# Patient Record
Sex: Female | Born: 1950 | Race: White | Hispanic: No | Marital: Married | Smoking: Never smoker
Health system: Southern US, Community
[De-identification: ages and names within clinical notes are randomized; demographics above are authoritative.]

## PROBLEM LIST (undated history)

## (undated) DIAGNOSIS — R7303 Prediabetes: Secondary | ICD-10-CM

## (undated) DIAGNOSIS — E119 Type 2 diabetes mellitus without complications: Secondary | ICD-10-CM

## (undated) DIAGNOSIS — D649 Anemia, unspecified: Secondary | ICD-10-CM

## (undated) HISTORY — DX: Anemia, unspecified: D64.9

## (undated) HISTORY — DX: Type 2 diabetes mellitus without complications: E11.9

---

## 2021-11-20 DIAGNOSIS — H4089 Other specified glaucoma: Secondary | ICD-10-CM | POA: Diagnosis not present

## 2021-11-20 DIAGNOSIS — E113593 Type 2 diabetes mellitus with proliferative diabetic retinopathy without macular edema, bilateral: Secondary | ICD-10-CM | POA: Diagnosis not present

## 2021-11-20 DIAGNOSIS — E113592 Type 2 diabetes mellitus with proliferative diabetic retinopathy without macular edema, left eye: Secondary | ICD-10-CM | POA: Diagnosis not present

## 2021-11-20 DIAGNOSIS — H1045 Other chronic allergic conjunctivitis: Secondary | ICD-10-CM | POA: Diagnosis not present

## 2021-11-20 DIAGNOSIS — E113511 Type 2 diabetes mellitus with proliferative diabetic retinopathy with macular edema, right eye: Secondary | ICD-10-CM | POA: Diagnosis not present

## 2021-11-20 DIAGNOSIS — H3582 Retinal ischemia: Secondary | ICD-10-CM | POA: Diagnosis not present

## 2021-11-20 DIAGNOSIS — H25813 Combined forms of age-related cataract, bilateral: Secondary | ICD-10-CM | POA: Diagnosis not present

## 2021-11-20 DIAGNOSIS — H401122 Primary open-angle glaucoma, left eye, moderate stage: Secondary | ICD-10-CM | POA: Diagnosis not present

## 2021-11-21 DIAGNOSIS — H401122 Primary open-angle glaucoma, left eye, moderate stage: Secondary | ICD-10-CM | POA: Diagnosis not present

## 2021-11-21 DIAGNOSIS — Q132 Other congenital malformations of iris: Secondary | ICD-10-CM | POA: Diagnosis not present

## 2021-11-21 DIAGNOSIS — H21543 Posterior synechiae (iris), bilateral: Secondary | ICD-10-CM | POA: Diagnosis not present

## 2021-11-21 DIAGNOSIS — H40053 Ocular hypertension, bilateral: Secondary | ICD-10-CM | POA: Diagnosis not present

## 2021-11-21 DIAGNOSIS — H25813 Combined forms of age-related cataract, bilateral: Secondary | ICD-10-CM | POA: Diagnosis not present

## 2021-11-28 DIAGNOSIS — E113511 Type 2 diabetes mellitus with proliferative diabetic retinopathy with macular edema, right eye: Secondary | ICD-10-CM | POA: Diagnosis not present

## 2021-12-26 DIAGNOSIS — E113412 Type 2 diabetes mellitus with severe nonproliferative diabetic retinopathy with macular edema, left eye: Secondary | ICD-10-CM | POA: Diagnosis not present

## 2022-01-01 DIAGNOSIS — E113511 Type 2 diabetes mellitus with proliferative diabetic retinopathy with macular edema, right eye: Secondary | ICD-10-CM | POA: Diagnosis not present

## 2022-01-12 DIAGNOSIS — H16402 Unspecified corneal neovascularization, left eye: Secondary | ICD-10-CM | POA: Diagnosis not present

## 2022-01-12 DIAGNOSIS — H401123 Primary open-angle glaucoma, left eye, severe stage: Secondary | ICD-10-CM | POA: Diagnosis not present

## 2022-01-12 DIAGNOSIS — H40052 Ocular hypertension, left eye: Secondary | ICD-10-CM | POA: Diagnosis not present

## 2022-01-23 DIAGNOSIS — H2513 Age-related nuclear cataract, bilateral: Secondary | ICD-10-CM | POA: Diagnosis not present

## 2022-01-23 DIAGNOSIS — S0502XA Injury of conjunctiva and corneal abrasion without foreign body, left eye, initial encounter: Secondary | ICD-10-CM | POA: Diagnosis not present

## 2022-01-23 DIAGNOSIS — H4089 Other specified glaucoma: Secondary | ICD-10-CM | POA: Diagnosis not present

## 2022-01-23 DIAGNOSIS — H3582 Retinal ischemia: Secondary | ICD-10-CM | POA: Diagnosis not present

## 2022-01-23 DIAGNOSIS — E113593 Type 2 diabetes mellitus with proliferative diabetic retinopathy without macular edema, bilateral: Secondary | ICD-10-CM | POA: Diagnosis not present

## 2022-01-24 DIAGNOSIS — E113593 Type 2 diabetes mellitus with proliferative diabetic retinopathy without macular edema, bilateral: Secondary | ICD-10-CM | POA: Diagnosis not present

## 2022-01-24 DIAGNOSIS — H4089 Other specified glaucoma: Secondary | ICD-10-CM | POA: Diagnosis not present

## 2022-01-24 DIAGNOSIS — S0502XA Injury of conjunctiva and corneal abrasion without foreign body, left eye, initial encounter: Secondary | ICD-10-CM | POA: Diagnosis not present

## 2022-01-24 DIAGNOSIS — H3582 Retinal ischemia: Secondary | ICD-10-CM | POA: Diagnosis not present

## 2022-01-24 DIAGNOSIS — H2513 Age-related nuclear cataract, bilateral: Secondary | ICD-10-CM | POA: Diagnosis not present

## 2022-01-31 DIAGNOSIS — H3582 Retinal ischemia: Secondary | ICD-10-CM | POA: Diagnosis not present

## 2022-01-31 DIAGNOSIS — E113593 Type 2 diabetes mellitus with proliferative diabetic retinopathy without macular edema, bilateral: Secondary | ICD-10-CM | POA: Diagnosis not present

## 2022-01-31 DIAGNOSIS — S0502XA Injury of conjunctiva and corneal abrasion without foreign body, left eye, initial encounter: Secondary | ICD-10-CM | POA: Diagnosis not present

## 2022-01-31 DIAGNOSIS — H2513 Age-related nuclear cataract, bilateral: Secondary | ICD-10-CM | POA: Diagnosis not present

## 2022-01-31 DIAGNOSIS — H4089 Other specified glaucoma: Secondary | ICD-10-CM | POA: Diagnosis not present

## 2022-02-09 DIAGNOSIS — H25813 Combined forms of age-related cataract, bilateral: Secondary | ICD-10-CM | POA: Diagnosis not present

## 2022-02-09 DIAGNOSIS — H3582 Retinal ischemia: Secondary | ICD-10-CM | POA: Diagnosis not present

## 2022-02-09 DIAGNOSIS — H16402 Unspecified corneal neovascularization, left eye: Secondary | ICD-10-CM | POA: Diagnosis not present

## 2022-02-09 DIAGNOSIS — H43822 Vitreomacular adhesion, left eye: Secondary | ICD-10-CM | POA: Diagnosis not present

## 2022-02-09 DIAGNOSIS — H40052 Ocular hypertension, left eye: Secondary | ICD-10-CM | POA: Diagnosis not present

## 2022-02-09 DIAGNOSIS — E113511 Type 2 diabetes mellitus with proliferative diabetic retinopathy with macular edema, right eye: Secondary | ICD-10-CM | POA: Diagnosis not present

## 2022-02-09 DIAGNOSIS — H35041 Retinal micro-aneurysms, unspecified, right eye: Secondary | ICD-10-CM | POA: Diagnosis not present

## 2022-02-13 DIAGNOSIS — H3582 Retinal ischemia: Secondary | ICD-10-CM | POA: Diagnosis not present

## 2022-02-13 DIAGNOSIS — H2513 Age-related nuclear cataract, bilateral: Secondary | ICD-10-CM | POA: Diagnosis not present

## 2022-02-13 DIAGNOSIS — E113593 Type 2 diabetes mellitus with proliferative diabetic retinopathy without macular edema, bilateral: Secondary | ICD-10-CM | POA: Diagnosis not present

## 2022-02-13 DIAGNOSIS — H4089 Other specified glaucoma: Secondary | ICD-10-CM | POA: Diagnosis not present

## 2022-02-22 DIAGNOSIS — H40052 Ocular hypertension, left eye: Secondary | ICD-10-CM | POA: Diagnosis not present

## 2022-02-22 DIAGNOSIS — H25813 Combined forms of age-related cataract, bilateral: Secondary | ICD-10-CM | POA: Diagnosis not present

## 2022-02-22 DIAGNOSIS — H401123 Primary open-angle glaucoma, left eye, severe stage: Secondary | ICD-10-CM | POA: Diagnosis not present

## 2022-02-22 DIAGNOSIS — E113511 Type 2 diabetes mellitus with proliferative diabetic retinopathy with macular edema, right eye: Secondary | ICD-10-CM | POA: Diagnosis not present

## 2022-02-22 DIAGNOSIS — H35033 Hypertensive retinopathy, bilateral: Secondary | ICD-10-CM | POA: Diagnosis not present

## 2022-02-22 DIAGNOSIS — H3582 Retinal ischemia: Secondary | ICD-10-CM | POA: Diagnosis not present

## 2022-03-06 DIAGNOSIS — E113511 Type 2 diabetes mellitus with proliferative diabetic retinopathy with macular edema, right eye: Secondary | ICD-10-CM | POA: Diagnosis not present

## 2022-03-16 DIAGNOSIS — E113511 Type 2 diabetes mellitus with proliferative diabetic retinopathy with macular edema, right eye: Secondary | ICD-10-CM | POA: Diagnosis not present

## 2022-03-20 DIAGNOSIS — H3582 Retinal ischemia: Secondary | ICD-10-CM | POA: Diagnosis not present

## 2022-03-20 DIAGNOSIS — H4089 Other specified glaucoma: Secondary | ICD-10-CM | POA: Diagnosis not present

## 2022-03-20 DIAGNOSIS — E113593 Type 2 diabetes mellitus with proliferative diabetic retinopathy without macular edema, bilateral: Secondary | ICD-10-CM | POA: Diagnosis not present

## 2022-03-20 DIAGNOSIS — H2513 Age-related nuclear cataract, bilateral: Secondary | ICD-10-CM | POA: Diagnosis not present

## 2022-04-02 ENCOUNTER — Other Ambulatory Visit: Payer: Self-pay

## 2022-04-02 ENCOUNTER — Inpatient Hospital Stay (HOSPITAL_COMMUNITY)
Admission: EM | Admit: 2022-04-02 | Discharge: 2022-04-16 | DRG: 853 | Disposition: A | Discharge status: Skilled Nursing Facility | Payer: Medicare Other | Attending: Internal Medicine | Admitting: Internal Medicine

## 2022-04-02 ENCOUNTER — Encounter (HOSPITAL_COMMUNITY)
Admission: EM | Disposition: A | Discharge status: Skilled Nursing Facility | Payer: Self-pay | Source: Home / Self Care | Attending: Internal Medicine

## 2022-04-02 ENCOUNTER — Emergency Department (HOSPITAL_COMMUNITY): Payer: Medicare Other

## 2022-04-02 ENCOUNTER — Inpatient Hospital Stay (HOSPITAL_COMMUNITY): Payer: Medicare Other | Admitting: Certified Registered"

## 2022-04-02 ENCOUNTER — Encounter (HOSPITAL_COMMUNITY): Payer: Self-pay

## 2022-04-02 DIAGNOSIS — R41841 Cognitive communication deficit: Secondary | ICD-10-CM | POA: Diagnosis not present

## 2022-04-02 DIAGNOSIS — N319 Neuromuscular dysfunction of bladder, unspecified: Secondary | ICD-10-CM | POA: Diagnosis present

## 2022-04-02 DIAGNOSIS — I5020 Unspecified systolic (congestive) heart failure: Secondary | ICD-10-CM | POA: Diagnosis present

## 2022-04-02 DIAGNOSIS — B950 Streptococcus, group A, as the cause of diseases classified elsewhere: Secondary | ICD-10-CM | POA: Diagnosis not present

## 2022-04-02 DIAGNOSIS — E119 Type 2 diabetes mellitus without complications: Secondary | ICD-10-CM | POA: Diagnosis not present

## 2022-04-02 DIAGNOSIS — G9340 Encephalopathy, unspecified: Secondary | ICD-10-CM | POA: Diagnosis not present

## 2022-04-02 DIAGNOSIS — D696 Thrombocytopenia, unspecified: Secondary | ICD-10-CM | POA: Diagnosis not present

## 2022-04-02 DIAGNOSIS — E1165 Type 2 diabetes mellitus with hyperglycemia: Secondary | ICD-10-CM

## 2022-04-02 DIAGNOSIS — L02416 Cutaneous abscess of left lower limb: Secondary | ICD-10-CM | POA: Diagnosis present

## 2022-04-02 DIAGNOSIS — Z6828 Body mass index (BMI) 28.0-28.9, adult: Secondary | ICD-10-CM

## 2022-04-02 DIAGNOSIS — E531 Pyridoxine deficiency: Secondary | ICD-10-CM | POA: Diagnosis present

## 2022-04-02 DIAGNOSIS — R509 Fever, unspecified: Secondary | ICD-10-CM | POA: Diagnosis not present

## 2022-04-02 DIAGNOSIS — S80822A Blister (nonthermal), left lower leg, initial encounter: Secondary | ICD-10-CM

## 2022-04-02 DIAGNOSIS — M40204 Unspecified kyphosis, thoracic region: Secondary | ICD-10-CM | POA: Diagnosis not present

## 2022-04-02 DIAGNOSIS — E876 Hypokalemia: Secondary | ICD-10-CM | POA: Diagnosis not present

## 2022-04-02 DIAGNOSIS — M726 Necrotizing fasciitis: Secondary | ICD-10-CM

## 2022-04-02 DIAGNOSIS — E60 Dietary zinc deficiency: Secondary | ICD-10-CM | POA: Diagnosis present

## 2022-04-02 DIAGNOSIS — I110 Hypertensive heart disease with heart failure: Secondary | ICD-10-CM | POA: Diagnosis not present

## 2022-04-02 DIAGNOSIS — K5900 Constipation, unspecified: Secondary | ICD-10-CM | POA: Diagnosis not present

## 2022-04-02 DIAGNOSIS — R6520 Severe sepsis without septic shock: Secondary | ICD-10-CM | POA: Diagnosis present

## 2022-04-02 DIAGNOSIS — Z794 Long term (current) use of insulin: Secondary | ICD-10-CM | POA: Diagnosis not present

## 2022-04-02 DIAGNOSIS — E509 Vitamin A deficiency, unspecified: Secondary | ICD-10-CM | POA: Diagnosis present

## 2022-04-02 DIAGNOSIS — K5909 Other constipation: Secondary | ICD-10-CM | POA: Diagnosis not present

## 2022-04-02 DIAGNOSIS — R1310 Dysphagia, unspecified: Secondary | ICD-10-CM | POA: Diagnosis not present

## 2022-04-02 DIAGNOSIS — D649 Anemia, unspecified: Secondary | ICD-10-CM | POA: Diagnosis not present

## 2022-04-02 DIAGNOSIS — E43 Unspecified severe protein-calorie malnutrition: Secondary | ICD-10-CM | POA: Diagnosis present

## 2022-04-02 DIAGNOSIS — G9608 Other cranial cerebrospinal fluid leak: Secondary | ICD-10-CM | POA: Diagnosis present

## 2022-04-02 DIAGNOSIS — R609 Edema, unspecified: Secondary | ICD-10-CM | POA: Diagnosis not present

## 2022-04-02 DIAGNOSIS — L03116 Cellulitis of left lower limb: Secondary | ICD-10-CM | POA: Diagnosis present

## 2022-04-02 DIAGNOSIS — M7989 Other specified soft tissue disorders: Secondary | ICD-10-CM | POA: Diagnosis not present

## 2022-04-02 DIAGNOSIS — B370 Candidal stomatitis: Secondary | ICD-10-CM | POA: Diagnosis not present

## 2022-04-02 DIAGNOSIS — M6259 Muscle wasting and atrophy, not elsewhere classified, multiple sites: Secondary | ICD-10-CM | POA: Diagnosis not present

## 2022-04-02 DIAGNOSIS — Z89612 Acquired absence of left leg above knee: Secondary | ICD-10-CM

## 2022-04-02 DIAGNOSIS — R531 Weakness: Secondary | ICD-10-CM | POA: Diagnosis not present

## 2022-04-02 DIAGNOSIS — E871 Hypo-osmolality and hyponatremia: Secondary | ICD-10-CM

## 2022-04-02 DIAGNOSIS — D509 Iron deficiency anemia, unspecified: Secondary | ICD-10-CM | POA: Diagnosis present

## 2022-04-02 DIAGNOSIS — N179 Acute kidney failure, unspecified: Secondary | ICD-10-CM | POA: Diagnosis present

## 2022-04-02 DIAGNOSIS — I6782 Cerebral ischemia: Secondary | ICD-10-CM | POA: Diagnosis not present

## 2022-04-02 DIAGNOSIS — B955 Unspecified streptococcus as the cause of diseases classified elsewhere: Secondary | ICD-10-CM

## 2022-04-02 DIAGNOSIS — R7881 Bacteremia: Secondary | ICD-10-CM | POA: Diagnosis not present

## 2022-04-02 DIAGNOSIS — R1311 Dysphagia, oral phase: Secondary | ICD-10-CM | POA: Diagnosis not present

## 2022-04-02 DIAGNOSIS — I96 Gangrene, not elsewhere classified: Secondary | ICD-10-CM | POA: Diagnosis not present

## 2022-04-02 DIAGNOSIS — R202 Paresthesia of skin: Secondary | ICD-10-CM | POA: Diagnosis not present

## 2022-04-02 DIAGNOSIS — R2689 Other abnormalities of gait and mobility: Secondary | ICD-10-CM | POA: Diagnosis not present

## 2022-04-02 DIAGNOSIS — E861 Hypovolemia: Secondary | ICD-10-CM | POA: Diagnosis present

## 2022-04-02 DIAGNOSIS — R262 Difficulty in walking, not elsewhere classified: Secondary | ICD-10-CM | POA: Diagnosis not present

## 2022-04-02 DIAGNOSIS — R1312 Dysphagia, oropharyngeal phase: Secondary | ICD-10-CM | POA: Diagnosis not present

## 2022-04-02 DIAGNOSIS — Z7401 Bed confinement status: Secondary | ICD-10-CM | POA: Diagnosis not present

## 2022-04-02 DIAGNOSIS — A400 Sepsis due to streptococcus, group A: Secondary | ICD-10-CM | POA: Diagnosis not present

## 2022-04-02 DIAGNOSIS — I959 Hypotension, unspecified: Secondary | ICD-10-CM

## 2022-04-02 DIAGNOSIS — R339 Retention of urine, unspecified: Secondary | ICD-10-CM | POA: Diagnosis not present

## 2022-04-02 DIAGNOSIS — I517 Cardiomegaly: Secondary | ICD-10-CM | POA: Diagnosis not present

## 2022-04-02 DIAGNOSIS — R9431 Abnormal electrocardiogram [ECG] [EKG]: Secondary | ICD-10-CM | POA: Diagnosis not present

## 2022-04-02 DIAGNOSIS — Z9181 History of falling: Secondary | ICD-10-CM | POA: Diagnosis not present

## 2022-04-02 DIAGNOSIS — I509 Heart failure, unspecified: Secondary | ICD-10-CM | POA: Diagnosis not present

## 2022-04-02 DIAGNOSIS — R0689 Other abnormalities of breathing: Secondary | ICD-10-CM | POA: Diagnosis not present

## 2022-04-02 DIAGNOSIS — T8789 Other complications of amputation stump: Secondary | ICD-10-CM | POA: Diagnosis not present

## 2022-04-02 DIAGNOSIS — B379 Candidiasis, unspecified: Secondary | ICD-10-CM | POA: Diagnosis not present

## 2022-04-02 DIAGNOSIS — I5023 Acute on chronic systolic (congestive) heart failure: Secondary | ICD-10-CM | POA: Diagnosis not present

## 2022-04-02 DIAGNOSIS — R200 Anesthesia of skin: Secondary | ICD-10-CM | POA: Diagnosis not present

## 2022-04-02 DIAGNOSIS — M6281 Muscle weakness (generalized): Secondary | ICD-10-CM | POA: Diagnosis not present

## 2022-04-02 DIAGNOSIS — G9341 Metabolic encephalopathy: Secondary | ICD-10-CM | POA: Diagnosis present

## 2022-04-02 DIAGNOSIS — E1169 Type 2 diabetes mellitus with other specified complication: Secondary | ICD-10-CM | POA: Diagnosis not present

## 2022-04-02 DIAGNOSIS — R600 Localized edema: Secondary | ICD-10-CM | POA: Diagnosis not present

## 2022-04-02 HISTORY — PX: I & D EXTREMITY: SHX5045

## 2022-04-02 HISTORY — DX: Prediabetes: R73.03

## 2022-04-02 LAB — LIPID PANEL
Cholesterol: 87 mg/dL (ref 0–200)
HDL: 16 mg/dL — ABNORMAL LOW (ref >40)
LDL Cholesterol: 41 mg/dL (ref 0–99)
Total CHOL/HDL Ratio: 5.4 RATIO
Triglycerides: 150 mg/dL — ABNORMAL HIGH (ref <150)
VLDL: 30 mg/dL (ref 0–40)

## 2022-04-02 LAB — CBC WITH DIFFERENTIAL/PLATELET
Abs Immature Granulocytes: 0 10*3/uL (ref 0.00–0.07)
Basophils Absolute: 0 10*3/uL (ref 0.0–0.1)
Basophils Relative: 0 %
Eosinophils Absolute: 0 10*3/uL (ref 0.0–0.5)
Eosinophils Relative: 0 %
HCT: 26.8 % — ABNORMAL LOW (ref 36.0–46.0)
Hemoglobin: 9.3 g/dL — ABNORMAL LOW (ref 12.0–15.0)
Lymphocytes Relative: 5 %
Lymphs Abs: 0.3 10*3/uL — ABNORMAL LOW (ref 0.7–4.0)
MCH: 26.3 pg (ref 26.0–34.0)
MCHC: 34.7 g/dL (ref 30.0–36.0)
MCV: 75.9 fL — ABNORMAL LOW (ref 80.0–100.0)
Monocytes Absolute: 0 10*3/uL — ABNORMAL LOW (ref 0.1–1.0)
Monocytes Relative: 0 %
Neutro Abs: 4.8 10*3/uL (ref 1.7–7.7)
Neutrophils Relative %: 95 %
Platelets: 240 10*3/uL (ref 150–400)
RBC: 3.53 MIL/uL — ABNORMAL LOW (ref 3.87–5.11)
RDW: 12.8 % (ref 11.5–15.5)
WBC: 5.1 10*3/uL (ref 4.0–10.5)
nRBC: 0 % (ref 0.0–0.2)
nRBC: 1 /100 WBC — ABNORMAL HIGH

## 2022-04-02 LAB — I-STAT CHEM 8, ED
BUN: 29 mg/dL — ABNORMAL HIGH (ref 8–23)
BUN: 25 mg/dL — ABNORMAL HIGH (ref 8–23)
Calcium, Ion: 0.99 mmol/L — ABNORMAL LOW (ref 1.15–1.40)
Calcium, Ion: 1 mmol/L — ABNORMAL LOW (ref 1.15–1.40)
Chloride: 90 mmol/L — ABNORMAL LOW (ref 98–111)
Chloride: 89 mmol/L — ABNORMAL LOW (ref 98–111)
Creatinine, Ser: 1 mg/dL (ref 0.44–1.00)
Creatinine, Ser: 0.8 mg/dL (ref 0.44–1.00)
Glucose, Bld: 206 mg/dL — ABNORMAL HIGH (ref 70–99)
Glucose, Bld: 205 mg/dL — ABNORMAL HIGH (ref 70–99)
HCT: 26 % — ABNORMAL LOW (ref 36.0–46.0)
HCT: 30 % — ABNORMAL LOW (ref 36.0–46.0)
Hemoglobin: 8.8 g/dL — ABNORMAL LOW (ref 12.0–15.0)
Hemoglobin: 10.2 g/dL — ABNORMAL LOW (ref 12.0–15.0)
Potassium: 3.4 mmol/L — ABNORMAL LOW (ref 3.5–5.1)
Potassium: 3.3 mmol/L — ABNORMAL LOW (ref 3.5–5.1)
Sodium: 120 mmol/L — ABNORMAL LOW (ref 135–145)
Sodium: 120 mmol/L — ABNORMAL LOW (ref 135–145)
TCO2: 20 mmol/L — ABNORMAL LOW (ref 22–32)
TCO2: 18 mmol/L — ABNORMAL LOW (ref 22–32)

## 2022-04-02 LAB — BLOOD CULTURE ID PANEL (REFLEXED) - BCID2

## 2022-04-02 LAB — COMPREHENSIVE METABOLIC PANEL
ALT: 26 U/L (ref 0–44)
AST: 44 U/L — ABNORMAL HIGH (ref 15–41)
Albumin: 2.1 g/dL — ABNORMAL LOW (ref 3.5–5.0)
Alkaline Phosphatase: 48 U/L (ref 38–126)
Anion gap: 15 (ref 5–15)
BUN: 33 mg/dL — ABNORMAL HIGH (ref 8–23)
CO2: 17 mmol/L — ABNORMAL LOW (ref 22–32)
Calcium: 7.9 mg/dL — ABNORMAL LOW (ref 8.9–10.3)
Chloride: 88 mmol/L — ABNORMAL LOW (ref 98–111)
Creatinine, Ser: 1.06 mg/dL — ABNORMAL HIGH (ref 0.44–1.00)
GFR, Estimated: 57 mL/min — ABNORMAL LOW (ref >60)
Glucose, Bld: 202 mg/dL — ABNORMAL HIGH (ref 70–99)
Potassium: 3.4 mmol/L — ABNORMAL LOW (ref 3.5–5.1)
Sodium: 120 mmol/L — ABNORMAL LOW (ref 135–145)
Total Bilirubin: 0.7 mg/dL (ref 0.3–1.2)
Total Protein: 5.5 g/dL — ABNORMAL LOW (ref 6.5–8.1)

## 2022-04-02 LAB — IRON AND TIBC
Iron: 6 ug/dL — ABNORMAL LOW (ref 28–170)
Saturation Ratios: 3 % — ABNORMAL LOW (ref 10.4–31.8)
TIBC: 211 ug/dL — ABNORMAL LOW (ref 250–450)
UIBC: 205 ug/dL

## 2022-04-02 LAB — LACTIC ACID, PLASMA
Lactic Acid, Venous: 2.2 mmol/L (ref 0.5–1.9)
Lactic Acid, Venous: 2.4 mmol/L (ref 0.5–1.9)
Lactic Acid, Venous: 1.9 mmol/L (ref 0.5–1.9)

## 2022-04-02 LAB — GLUCOSE, CAPILLARY
Glucose-Capillary: 207 mg/dL — ABNORMAL HIGH (ref 70–99)
Glucose-Capillary: 182 mg/dL — ABNORMAL HIGH (ref 70–99)
Glucose-Capillary: 143 mg/dL — ABNORMAL HIGH (ref 70–99)

## 2022-04-02 LAB — BASIC METABOLIC PANEL
Anion gap: 10 (ref 5–15)
Anion gap: 14 (ref 5–15)
BUN: 25 mg/dL — ABNORMAL HIGH (ref 8–23)
BUN: 24 mg/dL — ABNORMAL HIGH (ref 8–23)
CO2: 19 mmol/L — ABNORMAL LOW (ref 22–32)
CO2: 19 mmol/L — ABNORMAL LOW (ref 22–32)
Calcium: 7.6 mg/dL — ABNORMAL LOW (ref 8.9–10.3)
Calcium: 7.9 mg/dL — ABNORMAL LOW (ref 8.9–10.3)
Chloride: 95 mmol/L — ABNORMAL LOW (ref 98–111)
Chloride: 93 mmol/L — ABNORMAL LOW (ref 98–111)
Creatinine, Ser: 0.85 mg/dL (ref 0.44–1.00)
Creatinine, Ser: 0.91 mg/dL (ref 0.44–1.00)
GFR, Estimated: >60 mL/min (ref >60)
GFR, Estimated: >60 mL/min (ref >60)
Glucose, Bld: 194 mg/dL — ABNORMAL HIGH (ref 70–99)
Glucose, Bld: 138 mg/dL — ABNORMAL HIGH (ref 70–99)
Potassium: 3.1 mmol/L — ABNORMAL LOW (ref 3.5–5.1)
Potassium: 3 mmol/L — ABNORMAL LOW (ref 3.5–5.1)
Sodium: 124 mmol/L — ABNORMAL LOW (ref 135–145)
Sodium: 126 mmol/L — ABNORMAL LOW (ref 135–145)

## 2022-04-02 LAB — HEMOGLOBIN A1C
Hgb A1c MFr Bld: 11 % — ABNORMAL HIGH (ref 4.8–5.6)
Mean Plasma Glucose: 269 mg/dL

## 2022-04-02 LAB — HIV ANTIBODY (ROUTINE TESTING W REFLEX): HIV Screen 4th Generation wRfx: NONREACTIVE

## 2022-04-02 LAB — AMMONIA: Ammonia: 16 umol/L (ref 9–35)

## 2022-04-02 LAB — PROTIME-INR
INR: 1.3 — ABNORMAL HIGH (ref 0.8–1.2)
Prothrombin Time: 15.8 seconds — ABNORMAL HIGH (ref 11.4–15.2)

## 2022-04-02 LAB — SURGICAL PCR SCREEN
MRSA, PCR: NEGATIVE
Staphylococcus aureus: POSITIVE — AB

## 2022-04-02 LAB — ABO/RH: ABO/RH(D): O POS

## 2022-04-02 LAB — FERRITIN: Ferritin: 90 ng/mL (ref 11–307)

## 2022-04-02 LAB — CK: Total CK: 369 U/L — ABNORMAL HIGH (ref 38–234)

## 2022-04-02 LAB — OSMOLALITY: Osmolality: 265 mOsm/kg — ABNORMAL LOW (ref 275–295)

## 2022-04-02 SURGERY — IRRIGATION AND DEBRIDEMENT EXTREMITY
Anesthesia: General | Laterality: Left

## 2022-04-02 MED ORDER — ACETAMINOPHEN 325 MG PO TABS: 650.0000 mg | ORAL_TABLET | Freq: Four times a day (QID) | ORAL | Status: DC | PRN

## 2022-04-02 MED ORDER — PROPOFOL 10 MG/ML IV BOLUS
INTRAVENOUS | Status: DC | PRN
  Administered 2022-04-02: 110 mg via INTRAVENOUS

## 2022-04-02 MED ORDER — CHLORHEXIDINE GLUCONATE 4 % EX LIQD: 60.0000 mL | Freq: Once | CUTANEOUS | Status: DC

## 2022-04-02 MED ORDER — PHENYLEPHRINE 80 MCG/ML (10ML) SYRINGE FOR IV PUSH (FOR BLOOD PRESSURE SUPPORT)
PREFILLED_SYRINGE | INTRAVENOUS | Status: DC | PRN
  Administered 2022-04-02: 240 ug via INTRAVENOUS
  Administered 2022-04-02: 80 ug via INTRAVENOUS
  Administered 2022-04-02: 320 ug via INTRAVENOUS
  Administered 2022-04-02 (×2): 240 ug via INTRAVENOUS
  Administered 2022-04-02: 160 ug via INTRAVENOUS

## 2022-04-02 MED ORDER — EPHEDRINE SULFATE-NACL 50-0.9 MG/10ML-% IV SOSY
PREFILLED_SYRINGE | INTRAVENOUS | Status: DC | PRN
  Administered 2022-04-02: 10 mg via INTRAVENOUS
  Administered 2022-04-02: 5 mg via INTRAVENOUS
  Administered 2022-04-02: 10 mg via INTRAVENOUS

## 2022-04-02 MED ORDER — PROPOFOL 10 MG/ML IV BOLUS
INTRAVENOUS | Status: AC
  Filled 2022-04-02: qty 20

## 2022-04-02 MED ORDER — PENICILLIN G POTASSIUM 20000000 UNITS IJ SOLR
4.0000 10*6.[IU] | INTRAVENOUS | Status: DC
  Administered 2022-04-03 (×2): 4 10*6.[IU] via INTRAVENOUS
  Filled 2022-04-02 (×6): qty 4

## 2022-04-02 MED ORDER — OXYCODONE HCL 5 MG PO TABS: 5.0000 mg | ORAL_TABLET | Freq: Once | ORAL | Status: DC | PRN

## 2022-04-02 MED ORDER — LACTATED RINGERS IV SOLN: INTRAVENOUS | Status: DC

## 2022-04-02 MED ORDER — SODIUM CHLORIDE 0.9 % IR SOLN
Status: DC | PRN
  Administered 2022-04-02: 1000 mL

## 2022-04-02 MED ORDER — ACETAMINOPHEN 650 MG RE SUPP: 650.0000 mg | Freq: Four times a day (QID) | RECTAL | Status: DC | PRN

## 2022-04-02 MED ORDER — DEXAMETHASONE SODIUM PHOSPHATE 10 MG/ML IJ SOLN
INTRAMUSCULAR | Status: DC | PRN
  Administered 2022-04-02: 5 mg via INTRAVENOUS

## 2022-04-02 MED ORDER — CHLORHEXIDINE GLUCONATE 0.12 % MT SOLN
15.0000 mL | Freq: Once | OROMUCOSAL | Status: AC
  Administered 2022-04-02: 15 mL via OROMUCOSAL
  Filled 2022-04-02 (×2): qty 15

## 2022-04-02 MED ORDER — LINEZOLID 600 MG/300ML IV SOLN
600.0000 mg | Freq: Two times a day (BID) | INTRAVENOUS | Status: DC
  Administered 2022-04-02 – 2022-04-09 (×14): 600 mg via INTRAVENOUS
  Filled 2022-04-02 (×16): qty 300

## 2022-04-02 MED ORDER — MIDAZOLAM HCL 2 MG/2ML IJ SOLN
INTRAMUSCULAR | Status: AC
  Filled 2022-04-02: qty 2

## 2022-04-02 MED ORDER — ROCURONIUM BROMIDE 10 MG/ML (PF) SYRINGE
PREFILLED_SYRINGE | INTRAVENOUS | Status: AC
  Filled 2022-04-02: qty 10

## 2022-04-02 MED ORDER — OXYCODONE HCL 5 MG/5ML PO SOLN: 5.0000 mg | Freq: Once | ORAL | Status: DC | PRN

## 2022-04-02 MED ORDER — PIPERACILLIN-TAZOBACTAM 3.375 G IVPB 30 MIN
3.3750 g | Freq: Once | INTRAVENOUS | Status: AC
  Administered 2022-04-02: 3.375 g via INTRAVENOUS
  Filled 2022-04-02: qty 50

## 2022-04-02 MED ORDER — LIDOCAINE 2% (20 MG/ML) 5 ML SYRINGE
INTRAMUSCULAR | Status: DC | PRN
  Administered 2022-04-02: 60 mg via INTRAVENOUS

## 2022-04-02 MED ORDER — ORAL CARE MOUTH RINSE: 15.0000 mL | Freq: Once | OROMUCOSAL | Status: AC

## 2022-04-02 MED ORDER — AMISULPRIDE (ANTIEMETIC) 5 MG/2ML IV SOLN
10.0000 mg | Freq: Once | INTRAVENOUS | Status: DC | PRN
Start: 2022-04-02 — End: 2022-04-02

## 2022-04-02 MED ORDER — TRANEXAMIC ACID-NACL 1000-0.7 MG/100ML-% IV SOLN
INTRAVENOUS | Status: AC
  Filled 2022-04-02: qty 100

## 2022-04-02 MED ORDER — POLYETHYLENE GLYCOL 3350 17 G PO PACK: 17.0000 g | PACK | Freq: Every day | ORAL | Status: DC | PRN

## 2022-04-02 MED ORDER — IOHEXOL 300 MG/ML  SOLN
100.0000 mL | Freq: Once | INTRAMUSCULAR | Status: AC | PRN
  Administered 2022-04-02: 100 mL via INTRAVENOUS

## 2022-04-02 MED ORDER — POTASSIUM CHLORIDE 20 MEQ PO PACK
40.0000 meq | PACK | Freq: Once | ORAL | Status: AC
  Administered 2022-04-02: 40 meq via ORAL
  Filled 2022-04-02: qty 2

## 2022-04-02 MED ORDER — HYDROCODONE-ACETAMINOPHEN 5-325 MG PO TABS: 1.0000 | ORAL_TABLET | Freq: Four times a day (QID) | ORAL | Status: DC | PRN

## 2022-04-02 MED ORDER — ONDANSETRON HCL 4 MG/2ML IJ SOLN
INTRAMUSCULAR | Status: AC
  Filled 2022-04-02: qty 2

## 2022-04-02 MED ORDER — ALBUMIN HUMAN 5 % IV SOLN: INTRAVENOUS | Status: DC | PRN

## 2022-04-02 MED ORDER — LACTATED RINGERS IV BOLUS: 1000.0000 mL | Freq: Once | INTRAVENOUS | Status: DC

## 2022-04-02 MED ORDER — LACTATED RINGERS IV BOLUS
1000.0000 mL | Freq: Once | INTRAVENOUS | Status: AC
  Administered 2022-04-02: 1000 mL via INTRAVENOUS

## 2022-04-02 MED ORDER — PROMETHAZINE HCL 25 MG/ML IJ SOLN: 6.2500 mg | INTRAMUSCULAR | Status: DC | PRN

## 2022-04-02 MED ORDER — EPHEDRINE 5 MG/ML INJ
INTRAVENOUS | Status: AC
  Filled 2022-04-02: qty 5

## 2022-04-02 MED ORDER — POVIDONE-IODINE 10 % EX SWAB
2.0000 | Freq: Once | CUTANEOUS | Status: AC
  Administered 2022-04-02: 2 via TOPICAL

## 2022-04-02 MED ORDER — SODIUM CHLORIDE 0.9 % IV SOLN: INTRAVENOUS | Status: DC

## 2022-04-02 MED ORDER — VANCOMYCIN HCL IN DEXTROSE 1-5 GM/200ML-% IV SOLN: 1000.0000 mg | Freq: Once | INTRAVENOUS | Status: DC

## 2022-04-02 MED ORDER — CEFAZOLIN SODIUM-DEXTROSE 2-4 GM/100ML-% IV SOLN
INTRAVENOUS | Status: AC
  Filled 2022-04-02: qty 100

## 2022-04-02 MED ORDER — CEFAZOLIN SODIUM-DEXTROSE 2-4 GM/100ML-% IV SOLN
2.0000 g | INTRAVENOUS | Status: AC
  Administered 2022-04-02: 2 g via INTRAVENOUS

## 2022-04-02 MED ORDER — FENTANYL CITRATE (PF) 100 MCG/2ML IJ SOLN
INTRAMUSCULAR | Status: DC | PRN
  Administered 2022-04-02: 50 ug via INTRAVENOUS
  Administered 2022-04-02: 100 ug via INTRAVENOUS

## 2022-04-02 MED ORDER — DEXAMETHASONE SODIUM PHOSPHATE 10 MG/ML IJ SOLN
INTRAMUSCULAR | Status: AC
  Filled 2022-04-02: qty 1

## 2022-04-02 MED ORDER — PIPERACILLIN-TAZOBACTAM 3.375 G IVPB
3.3750 g | Freq: Three times a day (TID) | INTRAVENOUS | Status: DC
  Administered 2022-04-02: 3.375 g via INTRAVENOUS
  Filled 2022-04-02: qty 50

## 2022-04-02 MED ORDER — MEPERIDINE HCL 25 MG/ML IJ SOLN: 6.2500 mg | INTRAMUSCULAR | Status: DC | PRN

## 2022-04-02 MED ORDER — SUCCINYLCHOLINE CHLORIDE 200 MG/10ML IV SOSY
PREFILLED_SYRINGE | INTRAVENOUS | Status: DC | PRN
  Administered 2022-04-02: 100 mg via INTRAVENOUS

## 2022-04-02 MED ORDER — ONDANSETRON HCL 4 MG/2ML IJ SOLN
INTRAMUSCULAR | Status: DC | PRN
  Administered 2022-04-02: 4 mg via INTRAVENOUS

## 2022-04-02 MED ORDER — INSULIN ASPART 100 UNIT/ML IJ SOLN
0.0000 [IU] | Freq: Three times a day (TID) | INTRAMUSCULAR | Status: DC
  Administered 2022-04-02 – 2022-04-03 (×3): 3 [IU] via SUBCUTANEOUS
  Administered 2022-04-04 (×2): 2 [IU] via SUBCUTANEOUS
  Administered 2022-04-05: 4 [IU] via SUBCUTANEOUS
  Administered 2022-04-05 (×2): 5 [IU] via SUBCUTANEOUS
  Administered 2022-04-06: 2 [IU] via SUBCUTANEOUS
  Administered 2022-04-06 – 2022-04-07 (×2): 3 [IU] via SUBCUTANEOUS
  Administered 2022-04-07: 2 [IU] via SUBCUTANEOUS
  Administered 2022-04-08: 5 [IU] via SUBCUTANEOUS
  Administered 2022-04-08: 2 [IU] via SUBCUTANEOUS
  Administered 2022-04-09: 3 [IU] via SUBCUTANEOUS

## 2022-04-02 MED ORDER — SODIUM CHLORIDE 0.9% FLUSH
3.0000 mL | Freq: Two times a day (BID) | INTRAVENOUS | Status: DC
  Administered 2022-04-02 – 2022-04-16 (×15): 3 mL via INTRAVENOUS

## 2022-04-02 MED ORDER — PHENYLEPHRINE 80 MCG/ML (10ML) SYRINGE FOR IV PUSH (FOR BLOOD PRESSURE SUPPORT)
PREFILLED_SYRINGE | INTRAVENOUS | Status: AC
  Filled 2022-04-02: qty 20

## 2022-04-02 MED ORDER — TRANEXAMIC ACID-NACL 1000-0.7 MG/100ML-% IV SOLN
INTRAVENOUS | Status: DC | PRN
  Administered 2022-04-02: 1000 mg via INTRAVENOUS

## 2022-04-02 MED ORDER — SODIUM CHLORIDE 0.9 % IV SOLN: INTRAVENOUS | Status: DC | PRN

## 2022-04-02 MED ORDER — FENTANYL CITRATE (PF) 250 MCG/5ML IJ SOLN
INTRAMUSCULAR | Status: AC
  Filled 2022-04-02: qty 5

## 2022-04-02 SURGICAL SUPPLY — 55 items
BAG COUNTER SPONGE SURGICOUNT (BAG) ×2 IMPLANT
BLADE SURG 10 STRL SS (BLADE) ×2 IMPLANT
BNDG COHESIVE 4X5 TAN STRL (GAUZE/BANDAGES/DRESSINGS) ×2 IMPLANT
BNDG CONFORM 3 STRL LF (GAUZE/BANDAGES/DRESSINGS) IMPLANT
BNDG ELASTIC 3X5.8 VLCR STR LF (GAUZE/BANDAGES/DRESSINGS) IMPLANT
BNDG ELASTIC 6X15 VLCR STRL LF (GAUZE/BANDAGES/DRESSINGS) ×2 IMPLANT
BNDG ESMARK 4X9 LF (GAUZE/BANDAGES/DRESSINGS) ×2 IMPLANT
BNDG GAUZE ELAST 4 BULKY (GAUZE/BANDAGES/DRESSINGS) ×2 IMPLANT
CANISTER WOUNDNEG PRESSURE 500 (CANNISTER) ×1 IMPLANT
CHLORAPREP W/TINT 26 (MISCELLANEOUS) ×2 IMPLANT
CONNECTOR Y WND VAC (MISCELLANEOUS) IMPLANT
COVER SURGICAL LIGHT HANDLE (MISCELLANEOUS) ×2 IMPLANT
CUFF TOURN SGL QUICK 18X4 (TOURNIQUET CUFF) IMPLANT
CUFF TOURN SGL QUICK 34 (TOURNIQUET CUFF)
CUFF TOURN SGL QUICK 42 (TOURNIQUET CUFF) IMPLANT
CUFF TRNQT CYL 34X4.125X (TOURNIQUET CUFF) IMPLANT
DRAPE DERMATAC (DRAPES) ×4 IMPLANT
DRAPE SURG 17X23 STRL (DRAPES) ×2 IMPLANT
DRAPE U-SHAPE 47X51 STRL (DRAPES) ×2 IMPLANT
DRESSING VERAFLO CLEANS CC MED (GAUZE/BANDAGES/DRESSINGS) IMPLANT
DRSG ADAPTIC 3X8 NADH LF (GAUZE/BANDAGES/DRESSINGS) ×2 IMPLANT
DRSG PAD ABDOMINAL 8X10 ST (GAUZE/BANDAGES/DRESSINGS) ×4 IMPLANT
DRSG VAC ATS SM SENSATRAC (GAUZE/BANDAGES/DRESSINGS) ×1 IMPLANT
DRSG VERAFLO CLEANSE CC MED (GAUZE/BANDAGES/DRESSINGS) ×6
DURAPREP 26ML APPLICATOR (WOUND CARE) ×2 IMPLANT
ELECT REM PT RETURN 9FT ADLT (ELECTROSURGICAL) ×2
ELECTRODE REM PT RTRN 9FT ADLT (ELECTROSURGICAL) ×1 IMPLANT
GAUZE SPONGE 4X4 12PLY STRL (GAUZE/BANDAGES/DRESSINGS) ×2 IMPLANT
GAUZE XEROFORM 5X9 LF (GAUZE/BANDAGES/DRESSINGS) IMPLANT
GLOVE BIO SURGEON STRL SZ7 (GLOVE) ×2 IMPLANT
GLOVE BIO SURGEON STRL SZ7.5 (GLOVE) ×2 IMPLANT
GLOVE BIOGEL PI IND STRL 8 (GLOVE) ×1 IMPLANT
GLOVE BIOGEL PI INDICATOR 8 (GLOVE) ×1
GOWN STRL REUS W/ TWL LRG LVL3 (GOWN DISPOSABLE) ×3 IMPLANT
GOWN STRL REUS W/TWL LRG LVL3 (GOWN DISPOSABLE) ×3
HANDPIECE INTERPULSE COAX TIP (DISPOSABLE)
KIT BASIN OR (CUSTOM PROCEDURE TRAY) ×2 IMPLANT
KIT TURNOVER KIT B (KITS) ×2 IMPLANT
MANIFOLD NEPTUNE II (INSTRUMENTS) ×2 IMPLANT
NS IRRIG 1000ML POUR BTL (IV SOLUTION) ×2 IMPLANT
PACK ORTHO EXTREMITY (CUSTOM PROCEDURE TRAY) ×2 IMPLANT
PAD ARMBOARD 7.5X6 YLW CONV (MISCELLANEOUS) ×4 IMPLANT
PAD NEG PRESSURE SENSATRAC (MISCELLANEOUS) ×2 IMPLANT
SET HNDPC FAN SPRY TIP SCT (DISPOSABLE) IMPLANT
SPONGE T-LAP 18X18 ~~LOC~~+RFID (SPONGE) ×4 IMPLANT
STAPLER VISISTAT 35W (STAPLE) ×2 IMPLANT
STOCKINETTE IMPERVIOUS 9X36 MD (GAUZE/BANDAGES/DRESSINGS) ×2 IMPLANT
SUT PROLENE 3 0 PS 2 (SUTURE) IMPLANT
SWAB CULTURE ESWAB REG 1ML (MISCELLANEOUS) IMPLANT
TOWEL GREEN STERILE (TOWEL DISPOSABLE) ×2 IMPLANT
TOWEL GREEN STERILE FF (TOWEL DISPOSABLE) ×2 IMPLANT
TUBE CONNECTING 12X1/4 (SUCTIONS) ×2 IMPLANT
WATER STERILE IRR 1000ML POUR (IV SOLUTION) ×2 IMPLANT
WND VAC CONN Y (MISCELLANEOUS) ×1
YANKAUER SUCT BULB TIP NO VENT (SUCTIONS) ×2 IMPLANT

## 2022-04-02 NOTE — Consult Note (Signed)
Orthopaedic Consult  Date/Time: 04/02/22 12:24 PM  Patient Name: Jill Munoz  Attending Physician: Mercie Eon, MD    ASSESSMENT & PLAN  Orthopaedic Assessment: 71 y.o. female with left lower extremity cellulitis, multiple bullae, and concern for necrotizing soft tissue infection.  Reductions/Procedures/Splinting/Anesthesia Performed: Reductions: None Splinting/casting: None Procedure(s): None Anesthesia: N/A  Plan: Proceed to the OR on urgent basis for debridement given finding of gas in soft tissue on CT.  Risks of the proposed surgical treatment were discussed with the patient, including bleeding, wound healing complications, infection, damage to surrounding structures, persistent pain, stiffness, lack of improvement, potential for subsequent arthritis or worsening of pre-existing arthritis, and need for further surgery, as well as complications related to anesthesia, cardiovascular complications, and death.  All questions were answered to the patient's satisfaction.  She understands all of this and wishes to proceed with surgery.  She and her husband also understand that multiple surgeries will likely be required, and if infection is not appropriately contained with debridement, amputation is a possibility.   Ernestina Columbia M.D. Orthopaedic Surgery [facility]   Medical Decision Making  Amount/complexity of data: Is there a pathologic fracture (e.g. neoplastic, osteoporotic insufficiency fracture)? No Independent interpretation of radiographic studies: Yes Review of radiology results (e.g. reports): Yes Tests ordered (e.g. additional radiographic studies, labs): Yes Lab results reviewed: No Reviewed old records: Yes History from another source (independent historian, e.g. family/friend/etc.): Yes Risk: Patient receiving IV controlled substances for pain: Yes Fracture requiring manipulation: No Urgent or emergent (non-elective) surgery likely  this admission: Yes Presence of medical comorbidities and/or surgical risk factors (e.g. current smoker, CAD, diabetes, COPD, CKD, etc.): Yes Closed fracture management WITHOUT manipulation: No Urgent minor procedure (e.g. joint aspiration, compartment pressure measurement, etc.): No Will likely need surgery as an outpatient: No     HPI Jill Munoz is a 71 y.o. female. Orthopaedic consultation has specifically been requested to address this patient's current musculoskeletal presentation.  She is here with her husband through most of the history is obtained.  Apparently for the past week patient has experienced multiple bouts of diarrhea, and ended up with fecal material on the skin of her left lower leg.  She eventually developed a bulge in this area which continued to enlarge and spread.  She also developed worsening pain and redness of the skin of the leg which continued to worsen.  She gradually became febrile and experienced alteration of consciousness.  Her husband eventually brought her to the emergency room today.   PMH Past Medical History:  Diagnosis Date   Pre-diabetes      PSH History reviewed. No pertinent surgical history. Home Medications Prior to Admission medications   Not on File     Allergies Not on File   Family History History reviewed. No pertinent family history.  Social History Social History   Socioeconomic History   Marital status: Married    Spouse name: Not on file   Number of children: Not on file   Years of education: Not on file   Highest education level: Not on file  Occupational History   Not on file  Tobacco Use   Smoking status: Never   Smokeless tobacco: Never  Substance and Sexual Activity   Alcohol use: Not on file   Drug use: Not on file   Sexual activity: Not on file  Other Topics Concern   Not on file  Social History Narrative   Not on file   Social Determinants of  Health   Financial Resource Strain: Not on file  Food  Insecurity: Not on file  Transportation Needs: Not on file  Physical Activity: Not on file  Stress: Not on file  Social Connections: Not on file  Intimate Partner Violence: Not on file     Review of Systems MSK: As noted per HPI above GI: No current Nausea/vomiting ENT: Denies sore throat, epistaxis CV: Denies chest pain  Resp: No current shortness of breath  Other than mentioned above, there are no Constitutional, Neurological, Psychiatric, ENT, Ophthalmological, Cardiovascular, Respiratory, GI, GU, Musculoskeletal, Integumentary, Lymphatic, Endocrine or Allergic issues.     Imaging  Independent interpretation of orthopaedic-relevant films: CT of the left leg demonstrates focal areas of air in the subcutaneous tissues, without muscle involvement.  This is localized to the medial aspect of the distal left leg.  Radiographic results: CT EXTREMITY LOWER LEFT W WO CONTRAST  Result Date: 04/02/2022 CLINICAL DATA:  Large fluid blister along the distal left lower leg EXAM: CT OF THE LOWER LEFT EXTREMITY WITHOUT CONTRAST TECHNIQUE: Multidetector CT imaging of the lower left extremity was performed following the standard protocol before and during bolus administration of intravenous contrast. RADIATION DOSE REDUCTION: This exam was performed according to the departmental dose-optimization program which includes automated exposure control, adjustment of the mA and/or kV according to patient size and/or use of iterative reconstruction technique. CONTRAST:  OMNIPAQUE IOHEXOL 300 MG/ML  SOLN COMPARISON:  None Available. FINDINGS: Bones/Joint/Cartilage Generalized osteopenia. No fracture or dislocation. Normal alignment. No joint effusion. Ligaments Ligaments are suboptimally evaluated by CT. Muscles and Tendons Muscles are normal. No muscle atrophy. No intramuscular fluid collection or hematoma. Soft tissue Soft tissue edema throughout the left lower leg consistent with cellulitis. Soft tissue  emphysema in the medial aspect of the distal left lower leg within the subcutaneous fat. Large skin blister along the distal medial lower leg/ankle measuring 8.3 x 2.8 x 1.6 cm. Peripheral vascular atherosclerotic disease. No soft tissue mass. IMPRESSION: 1. Soft tissue edema throughout the left lower leg consistent with cellulitis. Soft tissue emphysema in the medial aspect of the distal left lower leg within the subcutaneous fat concerning for necrotizing infection. Large skin blister along the distal medial lower leg/ankle measuring 8.3 x 2.8 x 1.6 cm. Electronically Signed   By: Elige Ko M.D.   On: 04/02/2022 09:23   CT Head Wo Contrast  Result Date: 04/02/2022 CLINICAL DATA:  Numbness or tingling, paresthesia (Ped 0-17y) EXAM: CT HEAD WITHOUT CONTRAST TECHNIQUE: Contiguous axial images were obtained from the base of the skull through the vertex without intravenous contrast. RADIATION DOSE REDUCTION: This exam was performed according to the departmental dose-optimization program which includes automated exposure control, adjustment of the mA and/or kV according to patient size and/or use of iterative reconstruction technique. COMPARISON:  None Available. FINDINGS: Brain: There is no evidence of acute intracranial hemorrhage. There is a low density extra-axial prominence along the left cerebral hemisphere, measuring up to 1.0 cm along the left frontal lobe and 1.1 cm along the parietal lobe (coronal series 5, images 30 and 47).There is mild flattening of the adjacent cortex. No ventricular effacement. The basal cisterns are patent. No significant midline shift. Scattered subcortical and periventricular white matter hypodensities, nonspecific but likely sequela of chronic small vessel ischemic disease. Vascular: No hyperdense vessel or unexpected calcification. Skull: Negative for acute fracture. Sinuses/Orbits: There is complete opacification of the right sphenoid sinus. Minimal ethmoid air cell mucosal  thickening. The mastoid air cells are clear.  The orbits are unremarkable. Other: None. 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 No priors for comparison. No evidence of an acute component. Mild sequela of chronic small vessel ischemic disease. Electronically Signed   By: Caprice Renshaw M.D.   On: 04/02/2022 09:09   DG Chest 2 View  Result Date: 04/02/2022 CLINICAL DATA:  71 year old female with suspected sepsis. Foot wound. EXAM: CHEST - 2 VIEW COMPARISON:  None Available. FINDINGS: Upright AP and lateral views of the chest. AP view mildly rotated to the right. Low lung volumes. There is cardiomegaly. Other mediastinal contours are within normal limits. Visualized tracheal air column is within normal limits. No pneumothorax, pulmonary edema, pleural effusion, or consolidation identified. Paucity of bowel gas in the upper abdomen. Osteopenia and thoracic kyphosis. No acute osseous abnormality identified. IMPRESSION: Low lung volumes.  No acute cardiopulmonary abnormality. Electronically Signed   By: Odessa Fleming M.D.   On: 04/02/2022 08:16   Labs  Recent Labs    04/02/22 0730 04/02/22 0750 04/02/22 1216  WBC 5.1  --   --   HGB 9.3* 8.8* 10.2*  HCT 26.8* 26.0* 30.0*  PLT 240  --   --    Recent Labs    04/02/22 0730 04/02/22 0750 04/02/22 1216  NA 120* 120* 120*  K 3.4* 3.4* 3.3*  CL 88* 90* 89*  CO2 17*  --   --   BUN 33* 29* 25*  CREATININE 1.06* 1.00 0.80  GLUCOSE 202* 206* 205*  CALCIUM 7.9*  --   --    Lab Results  Component Value Date   INR 1.3 (H) 04/02/2022        Physical Examination  Patient is a 71 y.o. year old female who is ill-appearing, mood is agitated.  Orientation: oriented to person and place  Vital Signs: BP 116/64   Pulse 100   Temp 97.7 F  (36.5 C)   Resp 18   SpO2 99%    Gait: Unable to ambulate, supine on stretcher  Heart: Normal rate Lungs: Non-labored breathing Abdomen: Soft, Non-tender   Right Upper Extremity: Inspection: Atraumatic Palpation: Nontender ROM: Full, painless Joint Stability: No instability Strength: Normal Skin: Intact Peripheral Vascular: Well perfused Reflexes: No pathologic Sensation: Intact to light touch distally Lymph Nodes: None Palpable Coordination: Intact, normal   Left Upper Extremity: Inspection: Atraumatic Palpation: Nontender ROM: Full, painless Joint Stability: No instability Strength: Normal Skin: Intact Peripheral Vascular: Well perfused Reflexes: No pathologic Sensation: Intact to light touch distally Lymph Nodes: None Palpable Coordination: Intact, normal    Right Lower Extremity: Inspection: Atraumatic Palpation: Nontender ROM: Full, painless Joint Stability: No instability Strength: Normal Skin: Intact Peripheral Vascular: Well perfused Reflexes: No pathologic Sensation: Intact to light touch distally Lymph Nodes: None Palpable Coordination: Intact, normal   Left Lower Extremity: Inspection: Large bulla over medial lower leg Palpation: Tender to palpation extending up let to knee; no effusion ROM: Limited ankle motion due to pain but no knee pain with ROM Joint Stability: No instability at knee or ankle Strength: Weak but intact ankle DF/PF; intact EHL/FHL, able to flex and extend toes Skin: Edematous, erythematous, and warm, consistent with severe cellulitis extending up leg to knee Peripheral Vascular: Palpable DP pulse; foot is warm and well perfused Reflexes: No pathologic Sensation: Intact to light touch distally in SP/DP/T distributions Lymph  Nodes: None Palpable Coordination: Limited by pain    Pelvis: Skin: Intact Palpation: Nontender Stability: No instability      The review of the patient's medications does not in any way constitute an  endorsement, by this clinician,  of their use, dosage, indications, route, efficacy, interactions, or other clinical parameters.  This note was generated within the [employer] EMR using Dragon medical speech recognition software and may contain inherent errors or omissions not intended by the user. Grammatical and punctuation errors, random word insertions, deletions, pronoun errors and incomplete sentences are occasional consequences of this technology due to software limitations. Not all errors are caught or corrected.  Although every attempt is made to root out erroneus and incomplete transcription, the note may still not fully represent the intent or opinion of the author. If there are questions or concerns about the content of this note or information contained within the body of this dictation they should be addressed directly with the author for clarification.

## 2022-04-02 NOTE — Progress Notes (Signed)
PHARMACY - PHYSICIAN COMMUNICATION CRITICAL VALUE ALERT - BLOOD CULTURE IDENTIFICATION (BCID)  Jill Munoz is an 71 y.o. female who presented to [facility] on 04/02/2022  Assessment:  Strep pyogenes bacteremia in setting of necrotizing fascitis of the leg  Name of physician (or Provider) Contacted: Dr. Ned Card  Current antibiotics: Zyvox/Zosyn  Changes to prescribed antibiotics recommended:  Change Zosyn to IV Pen G 4 million units q4h Cont Zyvox (septic/hypotensive)  Results for orders placed or performed during the hospital encounter of 04/02/22  Blood Culture ID Panel (Reflexed) (Collected: 04/02/2022  7:30 AM)  Result Value Ref Range   Enterococcus faecalis NOT DETECTED NOT DETECTED   Enterococcus Faecium NOT DETECTED NOT DETECTED   Listeria monocytogenes NOT DETECTED NOT DETECTED   Staphylococcus species NOT DETECTED NOT DETECTED   Staphylococcus aureus (BCID) NOT DETECTED NOT DETECTED   Staphylococcus epidermidis NOT DETECTED NOT DETECTED   Staphylococcus lugdunensis NOT DETECTED NOT DETECTED   Streptococcus species DETECTED (A) NOT DETECTED   Streptococcus agalactiae NOT DETECTED NOT DETECTED   Streptococcus pneumoniae NOT DETECTED NOT DETECTED   Streptococcus pyogenes DETECTED (A) NOT DETECTED   A.calcoaceticus-baumannii NOT DETECTED NOT DETECTED   Bacteroides fragilis NOT DETECTED NOT DETECTED   Enterobacterales NOT DETECTED NOT DETECTED   Enterobacter cloacae complex NOT DETECTED NOT DETECTED   Escherichia coli NOT DETECTED NOT DETECTED   Klebsiella aerogenes NOT DETECTED NOT DETECTED   Klebsiella oxytoca NOT DETECTED NOT DETECTED   Klebsiella pneumoniae NOT DETECTED NOT DETECTED   Proteus species NOT DETECTED NOT DETECTED   Salmonella species NOT DETECTED NOT DETECTED   Serratia marcescens NOT DETECTED NOT DETECTED   Haemophilus influenzae NOT DETECTED NOT DETECTED   Neisseria meningitidis NOT DETECTED NOT DETECTED   Pseudomonas aeruginosa NOT DETECTED NOT  DETECTED   Stenotrophomonas maltophilia NOT DETECTED NOT DETECTED   Candida albicans NOT DETECTED NOT DETECTED   Candida auris NOT DETECTED NOT DETECTED   Candida glabrata NOT DETECTED NOT DETECTED   Candida krusei NOT DETECTED NOT DETECTED   Candida parapsilosis NOT DETECTED NOT DETECTED   Candida tropicalis NOT DETECTED NOT DETECTED   Cryptococcus neoformans/gattii NOT DETECTED NOT DETECTED    Abran Duke, PharmD, BCPS Clinical Pharmacist Phone: [phone]

## 2022-04-02 NOTE — Anesthesia Procedure Notes (Signed)
Procedure Name: Intubation Date/Time: 04/02/2022 12:55 PM  Performed by: Barrington Ellison, CRNAPre-anesthesia Checklist: Patient identified, Emergency Drugs available, Suction available and Patient being monitored Patient Re-evaluated:Patient Re-evaluated prior to induction Oxygen Delivery Method: Circle System Utilized Preoxygenation: Pre-oxygenation with 100% oxygen Induction Type: IV induction, Rapid sequence and Cricoid Pressure applied Ventilation: Mask ventilation without difficulty Laryngoscope Size: Mac and 3 Grade View: Grade I Tube type: Oral Tube size: 7.0 mm Number of attempts: 1 Airway Equipment and Method: Stylet and Oral airway Placement Confirmation: ETT inserted through vocal cords under direct vision, positive ETCO2 and breath sounds checked- equal and bilateral Secured at: 21 cm Tube secured with: Tape Dental Injury: Teeth and Oropharynx as per pre-operative assessment

## 2022-04-02 NOTE — ED Notes (Signed)
Provider notified of Lactic of 2.2

## 2022-04-02 NOTE — Transfer of Care (Signed)
Immediate Anesthesia Transfer of Care Note  Patient: Jill Munoz  Procedure(s) Performed: IRRIGATION AND DEBRIDEMENT LEFT LOWER LEG APPLICATION OF WOUND VAC  (Left)  Patient Location: PACU  Anesthesia Type:General  Level of Consciousness: lethargic and responds to stimulation  Airway & Oxygen Therapy: Patient Spontanous Breathing and Patient connected to face mask oxygen  Post-op Assessment: Report given to RN  Post vital signs: Reviewed and stable  Last Vitals:  Vitals Value Taken Time  BP 108/58 04/02/22 1422  Temp    Pulse 92 04/02/22 1425  Resp 26 04/02/22 1425  SpO2 100 % 04/02/22 1425  Vitals shown include unvalidated device data.  Last Pain:  Vitals:   04/02/22 1145  TempSrc:   PainSc: 0-No pain         Complications: No notable events documented.

## 2022-04-02 NOTE — ED Triage Notes (Signed)
Pt BIB EMS for AMS and left foot pain on the heel. Pt has a large blood blister on her left foot. Family reports vision loss in left eye and LKW was on Thursday.

## 2022-04-02 NOTE — Progress Notes (Signed)
Dr. Hyacinth Meeker notified of sodium, level 120. No new orders.

## 2022-04-02 NOTE — Consult Note (Signed)
Reason for Consult:Left lower leg Arther Dames fac Referring Physician: Alvira Monday Time called: 0848 Time at bedside: 1020   Jill Munoz is an 71 y.o. female.  HPI: Lexii developed an infection in there left leg Thursday evening. It began with a blister that gradually became larger and larger. It was associated with pain but no fevers, chills, sweats, N/V. She had a previous blister on her sole that popped prior to Thursday. She denies previous issues.  History reviewed. No pertinent past medical history.  History reviewed. No pertinent surgical history.  History reviewed. No pertinent family history.  Social History:  has no history on file for tobacco use, alcohol use, and drug use.  Allergies: Not on File  Medications: I have reviewed the patient's current medications.  Results for orders placed or performed during the hospital encounter of 04/02/22 (from the past 48 hour(s))  Comprehensive metabolic panel     Status: Abnormal   Collection Time: 04/02/22  7:30 AM  Result Value Ref Range   Sodium 120 (L) 135 - 145 mmol/L   Potassium 3.4 (L) 3.5 - 5.1 mmol/L   Chloride 88 (L) 98 - 111 mmol/L   CO2 17 (L) 22 - 32 mmol/L   Glucose, Bld 202 (H) 70 - 99 mg/dL    Comment: Glucose reference range applies only to samples taken after fasting for at least 8 hours.   BUN 33 (H) 8 - 23 mg/dL   Creatinine, Ser 7.42 (H) 0.44 - 1.00 mg/dL   Calcium 7.9 (L) 8.9 - 10.3 mg/dL   Total Protein 5.5 (L) 6.5 - 8.1 g/dL   Albumin 2.1 (L) 3.5 - 5.0 g/dL   AST 44 (H) 15 - 41 U/L   ALT 26 0 - 44 U/L   Alkaline Phosphatase 48 38 - 126 U/L   Total Bilirubin 0.7 0.3 - 1.2 mg/dL   GFR, Estimated 57 (L) >60 mL/min    Comment: (NOTE) Calculated using the CKD-EPI Creatinine Equation (2021)    Anion gap 15 5 - 15    Comment: Performed at [facility], [address], [place], [place] [address]  Lactic acid, plasma     Status: Abnormal   Collection Time: 04/02/22  7:30 AM  Result Value Ref  Range   Lactic Acid, Venous 2.2 (HH) 0.5 - 1.9 mmol/L    Comment: CRITICAL RESULT CALLED TO, READ BACK BY AND VERIFIED WITH: Quentin Cornwall, 0829 04/02/22, A. RAMSEY Performed at [facility], [address], [place], [place] [address]   CBC with Differential     Status: Abnormal   Collection Time: 04/02/22  7:30 AM  Result Value Ref Range   WBC 5.1 4.0 - 10.5 K/uL   RBC 3.53 (L) 3.87 - 5.11 MIL/uL   Hemoglobin 9.3 (L) 12.0 - 15.0 g/dL   HCT 33.2 (L) 95.1 - 88.4 %   MCV 75.9 (L) 80.0 - 100.0 fL   MCH 26.3 26.0 - 34.0 pg   MCHC 34.7 30.0 - 36.0 g/dL   RDW 16.6 06.3 - 01.6 %   Platelets 240 150 - 400 K/uL   nRBC 0.0 0.0 - 0.2 %   Neutrophils Relative % 95 %   Neutro Abs 4.8 1.7 - 7.7 K/uL   Lymphocytes Relative 5 %   Lymphs Abs 0.3 (L) 0.7 - 4.0 K/uL   Monocytes Relative 0 %   Monocytes Absolute 0.0 (L) 0.1 - 1.0 K/uL   Eosinophils Relative 0 %   Eosinophils Absolute 0.0  0.0 - 0.5 K/uL   Basophils Relative 0 %   Basophils Absolute 0.0 0.0 - 0.1 K/uL   WBC Morphology See Note     Comment: Increased Bands. >20% Bands  Mild Left Shift. 1 to 5% Metas and Myelos, Occ Pro Noted.  Vaculated Neutrophils    nRBC 1 (H) 0 /100 WBC   Abs Immature Granulocytes 0.00 0.00 - 0.07 K/uL   Polychromasia MARKED     Comment: Performed at [facility], [address] [place], [place] [address]  Protime-INR     Status: Abnormal   Collection Time: 04/02/22  7:30 AM  Result Value Ref Range   Prothrombin Time 15.8 (H) 11.4 - 15.2 seconds   INR 1.3 (H) 0.8 - 1.2    Comment: (NOTE) INR goal varies based on device and disease states. Performed at [facility], [address] [place], [place] [address]   CK     Status: Abnormal   Collection Time: 04/02/22  7:30 AM  Result Value Ref Range   Total CK 369 (H) 38 - 234 U/L    Comment: Performed at [facility], [address], [place], [place] [address]  Ammonia     Status: None   Collection Time: 04/02/22  7:30 AM  Result  Value Ref Range   Ammonia 16 9 - 35 umol/L    Comment: Performed at [facility], [address], [place], [place] [address]  I-stat chem 8, ED (not at [facility] or [facility])     Status: Abnormal   Collection Time: 04/02/22  7:50 AM  Result Value Ref Range   Sodium 120 (L) 135 - 145 mmol/L   Potassium 3.4 (L) 3.5 - 5.1 mmol/L   Chloride 90 (L) 98 - 111 mmol/L   BUN 29 (H) 8 - 23 mg/dL   Creatinine, Ser 7.79 0.44 - 1.00 mg/dL   Glucose, Bld 390 (H) 70 - 99 mg/dL    Comment: Glucose reference range applies only to samples taken after fasting for at least 8 hours.   Calcium, Ion 0.99 (L) 1.15 - 1.40 mmol/L   TCO2 20 (L) 22 - 32 mmol/L   Hemoglobin 8.8 (L) 12.0 - 15.0 g/dL   HCT 30.0 (L) 92.3 - 30.0 %  Lactic acid, plasma     Status: Abnormal   Collection Time: 04/02/22  9:37 AM  Result Value Ref Range   Lactic Acid, Venous 2.4 (HH) 0.5 - 1.9 mmol/L    Comment: CRITICAL RESULT CALLED TO, READ BACK BY AND VERIFIED WITH: [facility] 04/02/2022 AT 1025 AHUGHES Performed at [facility], [address] [place], [place] [address]     CT EXTREMITY LOWER LEFT W WO CONTRAST  Result Date: 04/02/2022 CLINICAL DATA:  Large fluid blister along the distal left lower leg EXAM: CT OF THE LOWER LEFT EXTREMITY WITHOUT CONTRAST TECHNIQUE: Multidetector CT imaging of the lower left extremity was performed following the standard protocol before and during bolus administration of intravenous contrast. RADIATION DOSE REDUCTION: This exam was performed according to the departmental dose-optimization program which includes automated exposure control, adjustment of the mA and/or kV according to patient size and/or use of iterative reconstruction technique. CONTRAST:  OMNIPAQUE IOHEXOL 300 MG/ML  SOLN COMPARISON:  None Available. FINDINGS: Bones/Joint/Cartilage Generalized osteopenia. No fracture or dislocation. Normal alignment. No joint effusion. Ligaments Ligaments are suboptimally evaluated by CT.  Muscles and Tendons Muscles are normal. No muscle atrophy. No intramuscular fluid collection or  hematoma. Soft tissue Soft tissue edema throughout the left lower leg consistent with cellulitis. Soft tissue emphysema in the medial aspect of the distal left lower leg within the subcutaneous fat. Large skin blister along the distal medial lower leg/ankle measuring 8.3 x 2.8 x 1.6 cm. Peripheral vascular atherosclerotic disease. No soft tissue mass. IMPRESSION: 1. Soft tissue edema throughout the left lower leg consistent with cellulitis. Soft tissue emphysema in the medial aspect of the distal left lower leg within the subcutaneous fat concerning for necrotizing infection. Large skin blister along the distal medial lower leg/ankle measuring 8.3 x 2.8 x 1.6 cm. Electronically Signed   By: Elige Ko M.D.   On: 04/02/2022 09:23   CT Head Wo Contrast  Result Date: 04/02/2022 CLINICAL DATA:  Numbness or tingling, paresthesia (Ped 0-17y) EXAM: CT HEAD WITHOUT CONTRAST TECHNIQUE: Contiguous axial images were obtained from the base of the skull through the vertex without intravenous contrast. RADIATION DOSE REDUCTION: This exam was performed according to the departmental dose-optimization program which includes automated exposure control, adjustment of the mA and/or kV according to patient size and/or use of iterative reconstruction technique. COMPARISON:  None Available. FINDINGS: Brain: There is no evidence of acute intracranial hemorrhage. There is a low density extra-axial prominence along the left cerebral hemisphere, measuring up to 1.0 cm along the left frontal lobe and 1.1 cm along the parietal lobe (coronal series 5, images 30 and 47).There is mild flattening of the adjacent cortex. No ventricular effacement. The basal cisterns are patent. No significant midline shift. Scattered subcortical and periventricular white matter hypodensities, nonspecific but likely sequela of chronic small vessel ischemic disease.  Vascular: No hyperdense vessel or unexpected calcification. Skull: Negative for acute fracture. Sinuses/Orbits: There is complete opacification of the right sphenoid sinus. Minimal ethmoid air cell mucosal thickening. The mastoid air cells are clear. The orbits are unremarkable. Other: None. 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 No priors for comparison. No evidence of an acute component. Mild sequela of chronic small vessel ischemic disease. Electronically Signed   By: Caprice Renshaw M.D.   On: 04/02/2022 09:09   DG Chest 2 View  Result Date: 04/02/2022 CLINICAL DATA:  71 year old female with suspected sepsis. Foot wound. EXAM: CHEST - 2 VIEW COMPARISON:  None Available. FINDINGS: Upright AP and lateral views of the chest. AP view mildly rotated to the right. Low lung volumes. There is cardiomegaly. Other mediastinal contours are within normal limits. Visualized tracheal air column is within normal limits. No pneumothorax, pulmonary edema, pleural effusion, or consolidation identified. Paucity of bowel gas in the upper abdomen. Osteopenia and thoracic kyphosis. No acute osseous abnormality identified. IMPRESSION: Low lung volumes.  No acute cardiopulmonary abnormality. Electronically Signed   By: Odessa Fleming M.D.   On: 04/02/2022 08:16    Review of Systems  Unable to perform ROS: Acuity of condition   Blood pressure (!) 84/60, pulse 94, temperature 97.6 F (36.4 C), temperature source Oral, resp. rate (!) 21, SpO2 98 %. Physical Exam Constitutional:      General: She is not in acute distress.    Appearance: She is well-developed. She is not diaphoretic.  HENT:     Head: Normocephalic and atraumatic.  Eyes:     General: No scleral icterus.       Right eye: No discharge.  Left eye: No discharge.     Conjunctiva/sclera: Conjunctivae normal.  Cardiovascular:     Rate and Rhythm: Normal rate and regular rhythm.  Pulmonary:     Effort: Pulmonary effort is normal. No respiratory distress.  Musculoskeletal:     Cervical back: Normal range of motion.     Comments: LLE No traumatic wounds, ecchymosis, or rash  Large circumferential bulla from proximal foot to distal lower leg  No knee or ankle effusion  Knee stable to varus/ valgus and anterior/posterior stress  Sens DPN, SPN, TN could not assess  Motor EHL, ext, flex, evers grossly intact  DP 0,  2+ edema  Skin:    General: Skin is warm and dry.  Neurological:     Mental Status: She is alert.  Psychiatric:        Mood and Affect: Mood normal.        Behavior: Behavior normal.     Assessment/Plan: Left lower leg necrotizing fasciitis -- Plan emergent I&D with Dr. Sherilyn Dacosta.    Freeman Caldron, PA-C Orthopedic Surgery [phone] 04/02/2022, 10:33 AM

## 2022-04-02 NOTE — H&P (Signed)
PREOPERATIVE H&P  HPI: Jill Munoz is a 71 y.o. female who has presented today for surgery, with the diagnosis of left leg cellulitis and early necrotizing soft tissue infection.  The various methods of treatment have been discussed with the patient and family.  After consideration of risks, benefits, and other options for treatment, the patient has consented to IRRIGATION AND DEBRIDEMENT LEFT LOWER LEG as a surgical intervention.  The patient's history has been reviewed, patient examined, no change in status, stable for surgery.  I have reviewed the patient's chart and labs.  Questions were answered to the patient's satisfaction.    PMH: Past Medical History:  Diagnosis Date   Pre-diabetes     Home Medications Allergies  No current facility-administered medications on file prior to encounter.   No current outpatient medications on file prior to encounter.   Not on File   PSH: History reviewed. No pertinent surgical history.   Family History Social History  History reviewed. No pertinent family history.  Social History   Socioeconomic History   Marital status: Married    Spouse name: Not on file   Number of children: Not on file   Years of education: Not on file   Highest education level: Not on file  Occupational History   Not on file  Tobacco Use   Smoking status: Never   Smokeless tobacco: Never  Substance and Sexual Activity   Alcohol use: Not on file   Drug use: Not on file   Sexual activity: Not on file  Other Topics Concern   Not on file  Social History Narrative   Not on file   Social Determinants of Health   Financial Resource Strain: Not on file  Food Insecurity: Not on file  Transportation Needs: Not on file  Physical Activity: Not on file  Stress: Not on file  Social Connections: Not on file     Review of Systems: MSK: As noted per HPI above GI: No current Nausea/vomiting ENT: Denies sore throat, epistaxis CV: Denies chest pain Resp: No  current shortness of breath  Other than mentioned above, there are no Constitutional, Neurological, Psychiatric, ENT, Ophthalmological, Cardiovascular, Respiratory, GI, GU, Musculoskeletal, Integumentary, Lymphatic, Endocrine or Allergic issues.   Physical Examination: CV: Normal distal pulses Lungs: Unlabored respirations Left Lower Extremity: Inspection: Large bulla over medial lower leg Palpation: Tender to palpation extending up let to knee; no effusion ROM: Limited ankle motion due to pain but no knee pain with ROM Strength: Weak but intact ankle DF/PF; intact EHL/FHL, able to flex and extend toes Skin: Edematous, erythematous, and warm, consistent with severe cellulitis extending up leg to knee Peripheral Vascular: Palpable DP pulse; foot is warm and well perfused Sensation: Intact to light touch distally in SP/DP/T distributions  Assessment/Plan: IRRIGATION AND DEBRIDEMENT LEFT LOWER LEG    Ernestina Columbia M.D. Orthopaedic Surgery [facility]  Review of this patient's medications prescribed by other providers does not in any way constitute an endorsement by this clinician of their use, indications, dosage, route, efficacy, interactions, or other clinical parameters.  Portions of the record have been created with voice recognition software.  Grammatical and punctuation errors, random word insertions, wrong-word or "sound-a-like" substitutions, pronoun errors (inaccuracies and/or substitutions), and/or incomplete sentences may have occurred due to the inherent limitations of voice recognition software.  Not all errors are caught or corrected.  Although every attempt is made to root out erroneous and incomplete transcription, the note may still not fully represent the  intent or opinion of the author.  Read the chart carefully and recognize, using context, where errors/substitutions have occurred.  Any questions or concerns about the content of this note or  information contained within the body of this dictation should be addressed directly with the author for clarification.
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
## 2022-04-02 NOTE — Progress Notes (Signed)
Patient arrived to 4e-08, vitals taken, chg performed, orders reviewed.

## 2022-04-02 NOTE — ED Notes (Signed)
Patient transported to CT 

## 2022-04-02 NOTE — Anesthesia Preprocedure Evaluation (Addendum)
Anesthesia Evaluation  Patient identified by MRN, date of birth, ID band Patient awake    Reviewed: Allergy & Precautions, NPO status , Patient's Chart, lab work & pertinent test results  Airway Mallampati: II  TM Distance: >3 FB Neck ROM: Full    Dental no notable dental hx. (+) Dental Advisory Given   Pulmonary neg pulmonary ROS,    Pulmonary exam normal breath sounds clear to auscultation       Cardiovascular negative cardio ROS Normal cardiovascular exam Rhythm:Regular Rate:Normal     Neuro/Psych negative neurological ROS  negative psych ROS   GI/Hepatic negative GI ROS, Neg liver ROS,   Endo/Other  negative endocrine ROS  Renal/GU negative Renal ROS  negative genitourinary   Musculoskeletal negative musculoskeletal ROS (+)   Abdominal   Peds negative pediatric ROS (+)  Hematology  (+) Blood dyscrasia, anemia ,   Anesthesia Other Findings Necrotizing Fasciitis Hyponatremia  Reproductive/Obstetrics negative OB ROS                            Anesthesia Physical Anesthesia Plan  ASA: 3 and emergent  Anesthesia Plan: General   Post-op Pain Management: Dilaudid IV   Induction: Intravenous  PONV Risk Score and Plan: 3 and Ondansetron, Dexamethasone, Midazolam and Treatment may vary due to age or medical condition  Airway Management Planned: Oral ETT  Additional Equipment: None  Intra-op Plan:   Post-operative Plan: Extubation in OR  Informed Consent: I have reviewed the patients History and Physical, chart, labs and discussed the procedure including the risks, benefits and alternatives for the proposed anesthesia with the patient or authorized representative who has indicated his/her understanding and acceptance.     Dental advisory given  Plan Discussed with: CRNA and Anesthesiologist  Anesthesia Plan Comments:        Anesthesia Quick Evaluation

## 2022-04-02 NOTE — Progress Notes (Addendum)
Clinical update 2145:  Dr. Elaina Pattee and I evaluated patient at the bedside this evening after she underwent debridement of left leg wound, I&D of left leg abscess, and placement of wound vac. The patient is alert and oriented x3 and is not complaining of any pain at this time. She remains hypotensive, with most recent BP being 89/53 and MAP 65. The patient had been on LR 100 cc/hr, however, we discontinued the IVF, as her sodium level is 126. Upon arrival to the hospital, her sodium level was 120 - it was thought that her hyponatremia was secondary to hypovolemia. Goal sodium is 126 by 7 am tomorrow, thus we stopped fluids since she is already at goal and we do not want to overcorrect this.   The patient appears comfortable at this time and was about to eat her dinner, with the assistance of her RN. Will reevaluate the patient later in the evening and advised RN to page if her MAP < 65.    Elza Rafter, DO Internal Medicine Resident, PGY-1 On call pager: [phone]   Addendum: Reevalauted the patient around 2320 and she was resting comfortably in bed. BP remains low, although MAP is stable right at 65. Next BMP check is at 0000 - if Na is <126, will re-initiate fluids and if Na is stable at 126 or higher, will consider higher level of care for BP support.

## 2022-04-02 NOTE — Progress Notes (Addendum)
Pharmacy Antibiotic Note  Jill Munoz is a 71 y.o. female admitted on 04/02/2022 with L leg Necrotizing soft tissue infection .  Pharmacy has been consulted for Zosyn dosing. Pt also on Linezolid S/p I&D 6/19  Plan: Zosyn 3.375gm IV q8h Will f/u micro data, renal function, and pt's clinical condition  Height: 5\' 5"  (165.1 cm) Weight: 77.1 kg (170 lb) IBW/kg (Calculated) : 57  Temp (24hrs), Avg:97.5 F (36.4 C), Min:97.4 F (36.3 C), Max:97.7 F (36.5 C)  Recent Labs  Lab 04/02/22 0730 04/02/22 0750 04/02/22 0937 04/02/22 1216 04/02/22 1556  WBC 5.1  --   --   --   --   CREATININE 1.06* 1.00  --  0.80 0.85  LATICACIDVEN 2.2*  --  2.4*  --  1.9    Estimated Creatinine Clearance: 64.1 mL/min (by C-G formula based on SCr of 0.85 mg/dL).    Not on File  Antimicrobials this admission: 6/19 Zosyn >>  6/19 Linezolid >> 6/19 Ancef x 1  Microbiology results: 6/19 BCx:  6/19 Wound Cx:   6/19 MRSA PCR: negative  Thank you for allowing pharmacy to be a part of this patient's care.  7/19, PharmD, BCPS Please see amion for complete clinical pharmacist phone list 04/02/2022 4:52 PM

## 2022-04-02 NOTE — Op Note (Signed)
OPERATIVE NOTE  Jill Munoz female 71 y.o. 04/02/2022  PREOPERATIVE DIAGNOSIS: Left leg cellulitis Left leg necrotizing soft tissue infection Leg bullae wounds x2  POSTOPERATIVE DIAGNOSIS: Left leg cellulitis (L03.116) Left leg subcutaneous abscess (L02.416) Left leg necrotizing soft tissue infection (M72.6) Leg bullae wounds x2 [id number])  PROCEDURE(S): Debridement left leg wound; skin, fat, fascia, bone; final size = 20.0 cm x 19.0 cm x 3.2 cm; initial 20 cm (11044) Debridement left leg wound; skin, fat, fascia, bone; final size equals 20.0 cm x 19.0 cm x 3.2 cm; each additional 20 cm, total of additional 360 cm (11047 x 17) Left leg abscess evacuation and debridement [id number]) Left leg, unroofing and debridement simple bulla, separate site; final size = 3.0 cm x 2.0 cm (10060) Placement negative pressure wound therapy to left leg wounds with DME, wound area > 50 cm [id number])  SURGEON: Georgeanna Harrison, M.D.  ASSISTANT(S): None  ANESTHESIA: General  FINDINGS: Preoperative Examination: Left Lower Extremity: Inspection: Large bulla over medial lower leg, containing dark hemorrhagic fluid and sloughed material; separate additional bulla over proximal lateral leg, less hemorrhagic in appearance Palpation: Tender to palpation extending up let to knee; no effusion ROM: Limited ankle motion due to pain but no knee pain with ROM Strength: Weak but intact ankle DF/PF; intact EHL/FHL, able to flex and extend toes Skin: Edematous, erythematous, and warm, consistent with severe cellulitis extending up leg to knee Peripheral Vascular: Palpable DP pulse; foot is warm and well perfused Sensation: Intact to light touch distally in SP/DP/T distributions  Operative Findings: Large hemorrhagic appearing fluid over medial aspect of lower leg with separate additional clear bulla over proximal lateral leg, as noted in preoperative examination.  Hemorrhagic erythematous base to the larger bulla  following unroofing.  Multiple abscess pockets of purulence extending over the anterior tibial cortex distally, towards the medial malleolus, and proximally along the fascial plane to the mid leg.  Necrotic fat tracking along these planes.  Intact fascia over the superficial posterior compartment.  Thorough debridement performed throughout all areas of wound resulting in healthy bleeding tissue without remnant infection.  IMPLANTS: * No implants in log *  INDICATIONS:  The patient is a 71 y.o. female with a several days history of worsening erythema and bullae on the left leg.  She apparently became increasingly febrile with altered mentation, and was brought in by her husband to the emergency room.  Antibiotics were initiated as the patient was septic, and a CT scan of the left leg was obtained.  This revealed subcutaneous air concerning for an early necrotizing infection.  Given the patient's worsening medical condition and concerning features of the CT, she was recommended for exploration and debridement of the infected devitalized area.  She understood the risks, benefits and alternatives to surgery which include but are not limited to bleeding, wound healing complications, infection, damage to surrounding structures, persistent pain, stiffness, lack of improvement, potential for subsequent arthritis or worsening of pre-existing arthritis, and need for further surgery, as well as complications related to anesthesia, cardiovascular complications, and death.  She also understood the potential for continued pain, and that there were no guarantees of acceptable outcome.  After weighing these risks the patient opted to proceed with surgery.  TECHNIQUE: Patient was identified in the preoperative holding area.  The left leg was marked by myself.  Consent was signed by myself and the patient.  No block was performed by anesthesia in the preoperative holding area.  Patient was taken to the operative suite  and  placed supine on the operative table.  Anesthesia was induced by the anesthesia team.  The patient was positioned appropriately for the procedure and all bony prominences were well padded.  A non-sterile thigh tourniquet was placed on the operative extremity.  Preoperative antibiotics were given.  Skin overlying the hemorrhagic bulla was sterilely prepped and cleansed and allowed to dry.  The bulla was then aspirated to obtain a fluid sample for culture using sterile technique.  The remaining fluid was then drained and the bulla was fully decompressed.  The extremity was prepped and draped in the usual sterile fashion and surgical timeout was performed.  Limb was exsanguinated with gravity and the tourniquet was inflated to 300 mmHg.  The skin overlying the bullae on the leg was excised sharply with 15 blade and Metzenbaums scissors.  Distally the base was hemorrhagic with several hemorrhagic appearing foci.  There was a separate more proximal more lateral bulla which was also unroofed sharply with a 15 blade and Metzenbaums scissors.  The base of this smaller bulla was irrigated thoroughly.  This tissue appeared healthy.  The size of this smaller bulla was 3.0 cm x 2.0 cm, both before and after debridement, and debridement was limited to the level skin in this area.  Attention was turned back to the larger distal medial bulla, which prior to debridement measured 19.5 cm x 18.5 cm.  All superficial devitalized tissue was sharply debrided.  A medial longitudinal incision was made on the medial aspect of the distal leg, posterior to the medial border of the tibia, in order to expose the deeper area of infection.  Skin was incised sharply.  Underlying subcutaneous fat was bluntly dissected with Metzenbaums, and several pockets of purulent abscess were encountered.  Multiple deep cultures were obtained.  Abscess pockets were identified by blunt dissection, and the purulent abscess fluid was evacuated.  After identifying  and evacuating pockets of abscess, a thorough debridement of the wound was performed.  Devitalized tissue was debrided with Metzenbaums scissors, and additional debridement was performed with a Cobb, curettes, and rongeurs.  This included debridement of devitalized fat and some periosteal tissue of the anterior tibial cortex.  Postdebridement size of the wound was 20.0 cm x 19.0 cm, with a 3.2 cm depth, extending to the level of bone at the anterior tibia.  All aspects of the wound were copiously irrigated with a total of 6 L normal saline using pulsatile lavage.  Please refer to the following additional details regarding debridement:  Debridement type: Excisional Debridement Side: left Body Location: leg  Tools used for debridement: scalpel, scissors, curette, and rongeur Pre-debridement Wound size (cm):    Length: 19.5 cm        Width: 18.5 cm      Depth: N/A  Post-debridement Wound size (cm): Length: 20.0 cm        Width: 19.0 cm      Depth: 3.2 cm  Debridement depth beyond dead/damaged tissue down to healthy viable tissue: yes Tissue layer involved: skin, subcutaneous tissue, muscle / fascia, bone Nature of tissue removed: Slough, Necrotic, Devitalized Tissue, Non-viable tissue, and Purulence Irrigation volume: 6 L    Irrigation fluid type: Normal Saline Infection PRESENT AT THE TIME OF SURGERY (PATOS):  Yes I encountered abscess and purulence to the level of the anterior tibial cortex bone At the tissue level: Bone   Total debrided surface area at the conclusion of the procedure was 380 cm.  Remaining tissue appeared healthy and  viable.  A multilevel negative pressure VAC was placed over the affected wounds, including black sponge deep in the medial wound with blue sponges on top of this, covering the entire base of the large bulla.  A separate blue sponge was placed over the more proximal lateral bulla base.  Derma tack was placed over the sponges, which was further sealed with Ioban  strips.  The 2 separate Lilly pads from each site were connected with a Y connector into the same VAC machine, which was set at 125 mm medium continuous suction.  Tourniquet was let down.  Patient was awakened from anesthesia and transferred to PACU in stable condition.  She tolerated procedure well.  There were no complications.  POST OPERATIVE INSTRUCTIONS: Mobility: Elevate left lower extremity higher than heart Pain control: Continue to wean/titrate to appropriate oral regimen DVT Prophylaxis: Per primary/medical team Further surgical plans: Will require return to the OR for subsequent debridement(s), with likely multiple debridements prior to closure RUE: Weightbearing as tolerated, no restrictions LUE: Weightbearing as tolerated, no restrictions RLE: Weightbearing as tolerated, no restrictions LLE: Nonweightbearing Disposition: Pending return to the OR.  Patient discussed with Dr. Aldean Baker, who will plan to return to OR Wednesday, 04/04/2022 Dressing care:  Maintain VAC to 125 mmHg medium continuous suction.  If leak occurs, please attempt to patch leak/reinforce VAC dressing with additional plastic strips or Tegaderms.  If this is unsuccessful, please connect to wall suction.  TOURNIQUET TIME:  Total Tourniquet Time Documented: Thigh (Left) - 54 minutes Total: Thigh (Left) - 54 minutes   BLOOD LOSS: 5 mL         DRAINS: none         SPECIMEN: none       COMPLICATIONS:  * No complications entered in OR log *         DISPOSITION: PACU - hemodynamically stable.         CONDITION: stable   Ernestina Columbia M.D. Orthopaedic Surgery [facility]   Portions of the record have been created with voice recognition software.  Grammatical and punctuation errors, random word insertions, wrong-word or "sound-a-like" substitutions, pronoun errors (inaccuracies and/or substitutions), and/or incomplete sentences may have occurred due to the inherent limitations  of voice recognition software.  Not all errors are caught or corrected.  Although every attempt is made to root out erroneous and incomplete transcription, the note may still not fully represent the intent or opinion of the author.  Read the chart carefully and recognize, using context, where errors/substitutions have occurred.  Any questions or concerns about the content of this note or information contained within the body of this dictation should be addressed directly with the author for clarification.

## 2022-04-02 NOTE — H&P (Cosign Needed Addendum)
Date: 04/02/2022               Patient Name:  Jill Munoz MRN: [id number]  DOB: Aug 22, 1951 Age / Sex: 71 y.o., female   PCP: Pcp, No         Medical Service: Internal Medicine Teaching Service         Attending Physician: Dr. Mercie Eon, MD    First Contact: Dr. Hazle Quant Pager: [phone]  Second Contact: Dr. Cyndie Chime Pager: [phone]       After Hours (After 5p/  First Contact Pager: [phone]  weekends / holidays): Second Contact Pager: [phone]   Chief Complaint: Left foot pain   History of Present Illness:   Jill Munoz is a 71 y/o female with no known PMHx presenting to the ED with c/o left foot pain. Patient's husband, Mr. Jill Munoz, provided collateral history.   Jill Munoz states that early last week, she developed nausea and vomiting after eating a meal. She suspected her symptoms were secondary to spoiled meat. Her symptoms significant worsened by June 14th. She subsequently developed diarrhea that was non-bloody and non-melanotic. Due to vomiting and diarrhea, she was unable to eat and felt very weak throughout. Due to this, her husband had to carry her to and from the bathroom and bathe her using wipes. On June 15th, she noticed that her left leg was irritated where he had cleaned her with an alcohol wipe. Within 24 hours, she developed a blister there that continued to spread. Jill Munoz denies any known fevers at home, chills, diaphoresis, SOB. Jill Munoz experienced new left leg numbness around the time the blister appeared, but otherwise, Jill Munoz denies any AMS. She has been able to tolerate water intake but has not eaten since June 15th.   Mr. Allender states patient sees a naturopathic doctor and an ophthalmologist. She has a history of elevated blood sugars that resolved after she was started on an unknown supplement. Additionally, she has been suffering with diffuse bony pain suspected to be arthritis for which she has been taking Tylenol PRN and additional  unknown herbal supplements. Lastly, patient has been experiencing bilateral hand numbness that he was told was secondary to allergies by their naturopathic doctor. He will bring a list the supplements from home. No other known PMHx.   ED Course:  On arrival to the ED, patient was hypotensive at 105/57 with heart rate of 97 and respiratory rate of 25.  She was saturating at 100% on room air.  Initial blood work demonstrates hemoglobin of 9.3 with MCV of 75, lactic acid of 2.2, sodium of 120, bicarb of 17 with anion gap of 15, glucose of 202, creatinine elevated at 1.06 and AST elevated at 44.  Due to large hemorrhagic bullae on left lower extremity, CT imaging was obtained that demonstrated findings concerning for necrotizing infection.  CT head was obtained due to paresthesia with findings significant for a 1 x 1.1 cm subdural hygroma with no mass effect.  Orthopedic surgery for necrotizing infection.  [facility] was consulted for admission  Meds:  No prescription medications  Tylenol PRN Unknown Herbal Supplements   Allergies: Allergies as of 04/02/2022   (Not on File)   Past Medical History:  Elevated blood sugar   Family History:  Unable to obtain due to emergent need for surgery.  Social History:  History obtained from patient's husband. No prior history of tobacco, drug or alcohol use. She primarily sees a naturopathic doctor. Independent in ADLs prior  to illness onset.  Review of Systems: A complete ROS was negative except as per HPI.   Physical Exam: Blood pressure 112/61, pulse 96, temperature (!) 97.5 F (36.4 C), resp. rate (!) 25, height 5\' 5"  (1.651 m), weight 77.1 kg, SpO2 96 %.  Examination limited as patient was emergently taken to the OR. Post-op examination limited by sedation.   Physical Exam Vitals and nursing note reviewed.  Constitutional:      Appearance: She is normal weight. She is ill-appearing.  HENT:     Head: Normocephalic and atraumatic.     Mouth/Throat:      Mouth: Mucous membranes are dry.     Pharynx: Oropharynx is clear.  Eyes:     Extraocular Movements: Extraocular movements intact.     Pupils: Pupils are equal, round, and reactive to light.  Cardiovascular:     Rate and Rhythm: Regular rhythm. Tachycardia present.     Heart sounds: No murmur heard.    No gallop.  Pulmonary:     Effort: Pulmonary effort is normal. Tachypnea present. No accessory muscle usage or respiratory distress.     Breath sounds: Normal breath sounds.  Abdominal:     General: Bowel sounds are normal. There is no distension.     Palpations: Abdomen is soft.     Tenderness: There is no abdominal tenderness. There is no guarding.  Musculoskeletal:     Right knee: Normal.     Left knee: Swelling present. No tenderness.     Left lower leg: Swelling present. 3+ Edema present.  Skin:    General: Skin is warm.     Findings: Erythema present.     Comments: Large hemorrhagic circumferential bullae involving the left lower leg. Approximately 8 cm vertically.  Entire leg lower leg warm to touch, indurated and mildly erythematous.   Neurological:     Mental Status: She is alert.     Comments: Answers questions appropriately. At baseline, per husband. Rest of examination deferred.   Psychiatric:        Mood and Affect: Mood normal.        Behavior: Behavior normal.      EKG: personally reviewed my interpretation is: Sinus rhythm with no acute ST or T wave changes.   CXR: personally reviewed my interpretation is: Low lung volumes. No infiltrates or pleural effusions.   Assessment & Plan by Problem: Principal Problem:   Necrotizing fasciitis (HCC) Active Problems:   Hyponatremia   Hypotension  Mrs. Shawntelle Munoz is a 71 y/o female with no known PMHx presenting to the ED with c/o left foot pain, currently admitted for necrotizing soft tissue infection.   # Necrotizing Fasciitis Patient presents with 4 day history of progressively worsened hemorrhagic  bullae with CT imaging consistent with necrotizing soft tissue infection. Diffuse involvement of left foot, leg and knee on examination. CK and AST elevation consistent with muscle involvement. No evidence of septic shock at this time, however high risk. Orthopedic surgery following with plan for emergency I&D today.   - Admit to progressive floor - Telemetry monitoring - Orthopedic surgery following; appreciate their recommendations  - Continue Zosyn per pharmacy dosing - Continue Linezolid as an alternative for Clindamycin due to shortage - Tylenol PRN for fever, mild pain  - Norco PRN for severe pain - Blood and surgical cultures pending  # Hypovolemic Hyponatremia  Sodium low on admission at 120. Patient endorses poor PO intake over the last 4-5 days with nausea and diarrhea. I also  suspect underlying necrotizing fasciitis is also contributing. Sodium has increased up to 124 with IVF. Serum osms after IVF low at 265.   - BMP q4h - Continue treating hypovolemia with LR @ 125 cc/hr  - Maximum Na goal of 126 by 6/20 @ 7 AM - If elevated between 127-129, will likely need to stop IVF - If markedly above goal (>130), will likely need Desmopressin and Nephrology consultation - Urine osm, sodium pending   # Hypotension Likely 2/2 to poor PO intake for the last 5 days and severe infection. BP has been responsive to IVF resuscitation; so far has received 4L of LR, 200 cc of NS and 5% albumin. Pressor support required intra-op only; BP has been stable post-op off pressor support.   - Monitor BP per unit protocol - Continue IVF resuscitation at 125 cc/hr   # Acute Kidney Injury Suspected pre-renal.  - Continue monitoring renal function daily - Continue IVF resuscitation  # Anion Gap Metabolic Acidosis  Likely multi-factorial. Likely an underlying NAGMA as well given history of vomiting and diarrhea with now mild AGMA given elevation in lactic acid.   - Monitor bicarb daily - No  indication for bicarb infusion  # Type 2 Diabetes Mellitus  Per patient's husband, past medical history of elevated blood sugars not currently on any antiglycemic medications.  A1c obtained on admission and markedly elevated at 11% consistent with type 2 diabetes.  We will need to discuss with Mrs. Larsen the importance of starting prescription medications to treat this; if she is in agreement will likely need multiple agents.  - SSI, moderate - Lipid panel pending   # Microcytic Anemia  No known prior history. No reported melena or hematochezia.   - Monitor CBC daily - Transfuse for hemoglobin < 7 - Iron panel, ferritin pending   # Left Subdural Hygroma  Incidental finding on CT head. Will need complete neurological examination once sedation wears off post-op.   # Chronic Hand Numbness Given A1c elevation, I suspect this may be secondary to peripheral neuropathy in the setting of uncontrolled diabetes. Will also check a TSH and B12.   - TSH, B12 pending - Outpatient follow up recommended   Diet: Carb-Modified VTE: None IVF: LR,125cc/hr Code: Full. Will need to have further discussions regarding code status once patient is able to participate more.   Prior to Admission Living Arrangement: Home, living with her husband Anticipated Discharge Location:  TBD Barriers to Discharge: Continued medical stabilization with IVF, IV antibiotics. Additional surgical procedures.   Dispo: Admit patient to Inpatient with expected length of stay greater than 2 midnights.  Signed: Dr. Verdene Lennert Internal Medicine PGY-3 Pager: [phone]  After 5pm on weekdays and 1pm on weekends: On Call pager [phone]  04/02/2022, 4:06 PM

## 2022-04-02 NOTE — Anesthesia Postprocedure Evaluation (Signed)
Anesthesia Post Note  Patient: Jill Munoz  Procedure(s) Performed: IRRIGATION AND DEBRIDEMENT LEFT LOWER LEG APPLICATION OF WOUND VAC  (Left)     Patient location during evaluation: PACU Anesthesia Type: General Level of consciousness: awake and sedated Pain management: pain level controlled Vital Signs Assessment: post-procedure vital signs reviewed and stable Respiratory status: spontaneous breathing Cardiovascular status: stable Postop Assessment: no apparent nausea or vomiting Anesthetic complications: no   No notable events documented.  Last Vitals:  Vitals:   04/02/22 1510 04/02/22 1540  BP: 112/61   Pulse: 96   Resp: (!) 25   Temp:  (!) 36.4 C  SpO2: 96%     Last Pain:  Vitals:   04/02/22 1455  TempSrc:   PainSc: Asleep                 Caren Macadam

## 2022-04-03 ENCOUNTER — Inpatient Hospital Stay (HOSPITAL_COMMUNITY): Payer: Medicare Other

## 2022-04-03 ENCOUNTER — Encounter (HOSPITAL_COMMUNITY): Payer: Self-pay | Admitting: Orthopedic Surgery

## 2022-04-03 DIAGNOSIS — R7881 Bacteremia: Secondary | ICD-10-CM

## 2022-04-03 DIAGNOSIS — M726 Necrotizing fasciitis: Secondary | ICD-10-CM | POA: Diagnosis not present

## 2022-04-03 DIAGNOSIS — E1165 Type 2 diabetes mellitus with hyperglycemia: Secondary | ICD-10-CM

## 2022-04-03 DIAGNOSIS — Z794 Long term (current) use of insulin: Secondary | ICD-10-CM

## 2022-04-03 DIAGNOSIS — N179 Acute kidney failure, unspecified: Secondary | ICD-10-CM

## 2022-04-03 DIAGNOSIS — A400 Sepsis due to streptococcus, group A: Secondary | ICD-10-CM

## 2022-04-03 DIAGNOSIS — E871 Hypo-osmolality and hyponatremia: Secondary | ICD-10-CM | POA: Diagnosis not present

## 2022-04-03 DIAGNOSIS — B955 Unspecified streptococcus as the cause of diseases classified elsewhere: Secondary | ICD-10-CM

## 2022-04-03 LAB — RETICULOCYTES
Immature Retic Fract: 12.9 % (ref 2.3–15.9)
RBC.: 3.31 MIL/uL — ABNORMAL LOW (ref 3.87–5.11)
Retic Count, Absolute: 27.8 10*3/uL (ref 19.0–186.0)
Retic Ct Pct: 0.8 % (ref 0.4–3.1)

## 2022-04-03 LAB — OSMOLALITY, URINE: Osmolality, Ur: 531 mOsm/kg (ref 300–900)

## 2022-04-03 LAB — BASIC METABOLIC PANEL
Anion gap: 11 (ref 5–15)
Anion gap: 11 (ref 5–15)
Anion gap: 13 (ref 5–15)
Anion gap: 11 (ref 5–15)
Anion gap: 14 (ref 5–15)
BUN: 25 mg/dL — ABNORMAL HIGH (ref 8–23)
BUN: 24 mg/dL — ABNORMAL HIGH (ref 8–23)
BUN: 26 mg/dL — ABNORMAL HIGH (ref 8–23)
BUN: 27 mg/dL — ABNORMAL HIGH (ref 8–23)
BUN: 27 mg/dL — ABNORMAL HIGH (ref 8–23)
CO2: 19 mmol/L — ABNORMAL LOW (ref 22–32)
CO2: 19 mmol/L — ABNORMAL LOW (ref 22–32)
CO2: 18 mmol/L — ABNORMAL LOW (ref 22–32)
CO2: 19 mmol/L — ABNORMAL LOW (ref 22–32)
CO2: 16 mmol/L — ABNORMAL LOW (ref 22–32)
Calcium: 7.5 mg/dL — ABNORMAL LOW (ref 8.9–10.3)
Calcium: 7.5 mg/dL — ABNORMAL LOW (ref 8.9–10.3)
Calcium: 7.9 mg/dL — ABNORMAL LOW (ref 8.9–10.3)
Calcium: 7.8 mg/dL — ABNORMAL LOW (ref 8.9–10.3)
Calcium: 7.5 mg/dL — ABNORMAL LOW (ref 8.9–10.3)
Chloride: 93 mmol/L — ABNORMAL LOW (ref 98–111)
Chloride: 93 mmol/L — ABNORMAL LOW (ref 98–111)
Chloride: 94 mmol/L — ABNORMAL LOW (ref 98–111)
Chloride: 94 mmol/L — ABNORMAL LOW (ref 98–111)
Chloride: 93 mmol/L — ABNORMAL LOW (ref 98–111)
Creatinine, Ser: 0.99 mg/dL (ref 0.44–1.00)
Creatinine, Ser: 1.06 mg/dL — ABNORMAL HIGH (ref 0.44–1.00)
Creatinine, Ser: 1.2 mg/dL — ABNORMAL HIGH (ref 0.44–1.00)
Creatinine, Ser: 1.18 mg/dL — ABNORMAL HIGH (ref 0.44–1.00)
Creatinine, Ser: 1.24 mg/dL — ABNORMAL HIGH (ref 0.44–1.00)
GFR, Estimated: >60 mL/min (ref >60)
GFR, Estimated: 57 mL/min — ABNORMAL LOW (ref >60)
GFR, Estimated: 49 mL/min — ABNORMAL LOW (ref >60)
GFR, Estimated: 50 mL/min — ABNORMAL LOW (ref >60)
GFR, Estimated: 47 mL/min — ABNORMAL LOW (ref >60)
Glucose, Bld: 169 mg/dL — ABNORMAL HIGH (ref 70–99)
Glucose, Bld: 220 mg/dL — ABNORMAL HIGH (ref 70–99)
Glucose, Bld: 191 mg/dL — ABNORMAL HIGH (ref 70–99)
Glucose, Bld: 188 mg/dL — ABNORMAL HIGH (ref 70–99)
Glucose, Bld: 165 mg/dL — ABNORMAL HIGH (ref 70–99)
Potassium: 3.3 mmol/L — ABNORMAL LOW (ref 3.5–5.1)
Potassium: 3.7 mmol/L (ref 3.5–5.1)
Potassium: 3.7 mmol/L (ref 3.5–5.1)
Potassium: 3.7 mmol/L (ref 3.5–5.1)
Potassium: 3.7 mmol/L (ref 3.5–5.1)
Sodium: 123 mmol/L — ABNORMAL LOW (ref 135–145)
Sodium: 123 mmol/L — ABNORMAL LOW (ref 135–145)
Sodium: 125 mmol/L — ABNORMAL LOW (ref 135–145)
Sodium: 124 mmol/L — ABNORMAL LOW (ref 135–145)
Sodium: 123 mmol/L — ABNORMAL LOW (ref 135–145)

## 2022-04-03 LAB — GLUCOSE, CAPILLARY
Glucose-Capillary: 223 mg/dL — ABNORMAL HIGH (ref 70–99)
Glucose-Capillary: 180 mg/dL — ABNORMAL HIGH (ref 70–99)
Glucose-Capillary: 168 mg/dL — ABNORMAL HIGH (ref 70–99)
Glucose-Capillary: 125 mg/dL — ABNORMAL HIGH (ref 70–99)

## 2022-04-03 LAB — URINALYSIS, COMPLETE (UACMP) WITH MICROSCOPIC
Bilirubin Urine: NEGATIVE
Glucose, UA: 150 mg/dL — AB
Ketones, ur: NEGATIVE mg/dL
Leukocytes,Ua: NEGATIVE
Nitrite: NEGATIVE
Protein, ur: 100 mg/dL — AB
Specific Gravity, Urine: 1.029 (ref 1.005–1.030)
pH: 5 (ref 5.0–8.0)

## 2022-04-03 LAB — HEPATIC FUNCTION PANEL
ALT: 22 U/L (ref 0–44)
AST: 26 U/L (ref 15–41)
Albumin: 1.7 g/dL — ABNORMAL LOW (ref 3.5–5.0)
Alkaline Phosphatase: 41 U/L (ref 38–126)
Bilirubin, Direct: 0.2 mg/dL (ref 0.0–0.2)
Indirect Bilirubin: 0.4 mg/dL (ref 0.3–0.9)
Total Bilirubin: 0.6 mg/dL (ref 0.3–1.2)
Total Protein: 5 g/dL — ABNORMAL LOW (ref 6.5–8.1)

## 2022-04-03 LAB — CBC WITH DIFFERENTIAL/PLATELET
Abs Immature Granulocytes: 0.03 10*3/uL (ref 0.00–0.07)
Basophils Absolute: 0.1 10*3/uL (ref 0.0–0.1)
Basophils Relative: 1 %
Eosinophils Absolute: 0 10*3/uL (ref 0.0–0.5)
Eosinophils Relative: 0 %
HCT: 24.6 % — ABNORMAL LOW (ref 36.0–46.0)
Hemoglobin: 8.8 g/dL — ABNORMAL LOW (ref 12.0–15.0)
Immature Granulocytes: 0 %
Lymphocytes Relative: 3 %
Lymphs Abs: 0.2 10*3/uL — ABNORMAL LOW (ref 0.7–4.0)
MCH: 26.3 pg (ref 26.0–34.0)
MCHC: 35.8 g/dL (ref 30.0–36.0)
MCV: 73.7 fL — ABNORMAL LOW (ref 80.0–100.0)
Monocytes Absolute: 0.2 10*3/uL (ref 0.1–1.0)
Monocytes Relative: 3 %
Neutro Abs: 6.2 10*3/uL (ref 1.7–7.7)
Neutrophils Relative %: 93 %
Platelets: 180 10*3/uL (ref 150–400)
RBC: 3.34 MIL/uL — ABNORMAL LOW (ref 3.87–5.11)
RDW: 12.9 % (ref 11.5–15.5)
WBC: 6.7 10*3/uL (ref 4.0–10.5)
nRBC: 0.3 % — ABNORMAL HIGH (ref 0.0–0.2)

## 2022-04-03 LAB — SODIUM, URINE, RANDOM: Sodium, Ur: <10 mmol/L

## 2022-04-03 LAB — CORTISOL-AM, BLOOD: Cortisol - AM: 51.7 ug/dL — ABNORMAL HIGH (ref 6.7–22.6)

## 2022-04-03 LAB — ECHOCARDIOGRAM COMPLETE
Area-P 1/2: 3.33 cm2
Calc EF: 46.9 %
Height: 65 in
S' Lateral: 2.6 cm
Single Plane A2C EF: 48.1 %
Single Plane A4C EF: 46.4 %
Weight: 2720 oz

## 2022-04-03 LAB — TSH: TSH: 0.861 u[IU]/mL (ref 0.350–4.500)

## 2022-04-03 LAB — VITAMIN B12: Vitamin B-12: 4868 pg/mL — ABNORMAL HIGH (ref 180–914)

## 2022-04-03 MED ORDER — PENICILLIN G POTASSIUM 20000000 UNITS IJ SOLR
12.0000 10*6.[IU] | Freq: Two times a day (BID) | INTRAVENOUS | Status: DC
  Administered 2022-04-03 – 2022-04-04 (×2): 12 10*6.[IU] via INTRAVENOUS
  Filled 2022-04-03 (×4): qty 12

## 2022-04-03 MED ORDER — INSULIN STARTER KIT- PEN NEEDLES (ENGLISH)
1.0000 | Freq: Once | Status: AC
Start: 2022-04-03 — End: 2022-04-03
  Administered 2022-04-03: 1
  Filled 2022-04-03: qty 1

## 2022-04-03 MED ORDER — LACTATED RINGERS IV SOLN
INTRAVENOUS | Status: AC
Start: 2022-04-03 — End: 2022-04-03

## 2022-04-03 MED ORDER — LIVING WELL WITH DIABETES BOOK
Freq: Once | Status: AC
  Filled 2022-04-03: qty 1

## 2022-04-03 MED ORDER — INSULIN GLARGINE-YFGN 100 UNIT/ML ~~LOC~~ SOLN
5.0000 [IU] | Freq: Every day | SUBCUTANEOUS | Status: DC
  Administered 2022-04-03 – 2022-04-10 (×7): 5 [IU] via SUBCUTANEOUS
  Filled 2022-04-03 (×8): qty 0.05

## 2022-04-03 NOTE — Progress Notes (Signed)
HD#1 Subjective:  Overnight Events: BCID positive for s. Pyogenes, transitioned from zosyn to penicillin and continued linezolid. Hypotensive down to 89/53 but Na was initially at goal 126. Was initially given 0.45 NaCl, now down to 123 so LR 125 ml/hr was resumed.   Patient seen and and assessed at bedside.  Patient repeating her own phone number and her husband's number multiple times and is very perseverative on having [place] call him to keep her updated.  Reiterated that we would speak with her husband.  Patient oriented to self and location but not time.  Patient had no other questions at this time but did appear still encephalopathic possibly due to bacteremia and hyponatremia.  List of home supplements: sucontral D, compounding formula for Bilbery, blood pressure stabilizer, coptis, calcium citrate, Hawthorn, yeast cleanse, curamin, ester C, D3 5000, women over 55 daily MVI, blanad B complex  Objective:  Vital signs in last 24 hours: Vitals:   04/03/22 0400 04/03/22 0421 04/03/22 0500 04/03/22 0600  BP: 104/64 104/64 117/63 (!) 102/59  Pulse: (!) 104 (!) 109 (!) 107 (!) 108  Resp: (!) 24 20 (!) 21 (!) 27  Temp:  (!) 97.5 F (36.4 C)    TempSrc:  Oral    SpO2: 97% 96% 96% 97%  Weight:      Height:       Supplemental O2: Room Air SpO2: 97 % O2 Flow Rate (L/min): 2 L/min   Physical Exam:  Physical Exam Constitutional:      Appearance: She is ill-appearing.  HENT:     Head: Normocephalic and atraumatic.  Cardiovascular:     Rate and Rhythm: Regular rhythm. Tachycardia present.     Pulses: Normal pulses.     Heart sounds: Normal heart sounds.  Pulmonary:     Effort: Pulmonary effort is normal.     Breath sounds: Rales present.     Comments: Rales in lung bases Abdominal:     General: Bowel sounds are normal.     Palpations: Abdomen is soft.  Skin:    General: Skin is warm and dry.     Comments: Left leg in wound vac. Significant serosanguinous drainage. 2+ right leg  edema  Neurological:     Mental Status: She is alert.     Filed Weights   04/02/22 1244  Weight: 77.1 kg     Intake/Output Summary (Last 24 hours) at 04/03/2022 0648 Last data filed at 04/03/2022 0600 Gross per 24 hour  Intake 3608.32 ml  Output 330 ml  Net 3278.32 ml   Net IO Since Admission: 3,278.32 mL [04/03/22 0648]  Pertinent Labs:    Latest Ref Rng & Units 04/03/2022    5:03 AM 04/02/2022   12:16 PM 04/02/2022    7:50 AM  CBC  WBC 4.0 - 10.5 K/uL 6.7     Hemoglobin 12.0 - 15.0 g/dL 8.8  93.2  8.8   Hematocrit 36.0 - 46.0 % 24.6  30.0  26.0   Platelets 150 - 400 K/uL 180          Latest Ref Rng & Units 04/03/2022    5:03 AM 04/03/2022   12:36 AM 04/02/2022    8:39 PM  CMP  Glucose 70 - 99 mg/dL 671  245  809   BUN 8 - 23 mg/dL 24  25  24    Creatinine 0.44 - 1.00 mg/dL  9.83  3.82   Sodium 135 - 145 mmol/L 123  123  126  Potassium 3.5 - 5.1 mmol/L 3.7  3.3  3.0   Chloride 98 - 111 mmol/L 93  93  93   CO2 22 - 32 mmol/L 19  19  19    Calcium 8.9 - 10.3 mg/dL 7.5  7.5  7.9   Total Protein 6.5 - 8.1 g/dL 5.0     Total Bilirubin 0.3 - 1.2 mg/dL 0.6     Alkaline Phos 38 - 126 U/L 41     AST 15 - 41 U/L 26     ALT 0 - 44 U/L 22      Iron 6, TIBC 211, 3% saturation ratios  Imaging: CT EXTREMITY LOWER LEFT W WO CONTRAST  Result Date: 04/02/2022 CLINICAL DATA:  Large fluid blister along the distal left lower leg EXAM: CT OF THE LOWER LEFT EXTREMITY WITHOUT CONTRAST TECHNIQUE: Multidetector CT imaging of the lower left extremity was performed following the standard protocol before and during bolus administration of intravenous contrast. RADIATION DOSE REDUCTION: This exam was performed according to the departmental dose-optimization program which includes automated exposure control, adjustment of the mA and/or kV according to patient size and/or use of iterative reconstruction technique. CONTRAST:  04/04/2022 OMNIPAQUE IOHEXOL 300 MG/ML  SOLN COMPARISON:  None Available.  FINDINGS: Bones/Joint/Cartilage Generalized osteopenia. No fracture or dislocation. Normal alignment. No joint effusion. Ligaments Ligaments are suboptimally evaluated by CT. Muscles and Tendons Muscles are normal. No muscle atrophy. No intramuscular fluid collection or hematoma. Soft tissue Soft tissue edema throughout the left lower leg consistent with cellulitis. Soft tissue emphysema in the medial aspect of the distal left lower leg within the subcutaneous fat. Large skin blister along the distal medial lower leg/ankle measuring 8.3 x 2.8 x 1.6 cm. Peripheral vascular atherosclerotic disease. No soft tissue mass. IMPRESSION: 1. Soft tissue edema throughout the left lower leg consistent with cellulitis. Soft tissue emphysema in the medial aspect of the distal left lower leg within the subcutaneous fat concerning for necrotizing infection. Large skin blister along the distal medial lower leg/ankle measuring 8.3 x 2.8 x 1.6 cm. Electronically Signed   By: M.D.   On: 04/02/2022 09:23   CT Head Wo Contrast  Result Date: 04/02/2022 CLINICAL DATA:  Numbness or tingling, paresthesia (Ped 0-17y) EXAM: CT HEAD WITHOUT CONTRAST TECHNIQUE: Contiguous axial images were obtained from the base of the skull through the vertex without intravenous contrast. RADIATION DOSE REDUCTION: This exam was performed according to the departmental dose-optimization program which includes automated exposure control, adjustment of the mA and/or kV according to patient size and/or use of iterative reconstruction technique. COMPARISON:  None Available. FINDINGS: Brain: There is no evidence of acute intracranial hemorrhage. There is a low density extra-axial prominence along the left cerebral hemisphere, measuring up to 1.0 cm along the left frontal lobe and 1.1 cm along the parietal lobe (coronal series 5, images 30 and 47).There is mild flattening of the adjacent cortex. No ventricular effacement. The basal cisterns are patent.  No significant midline shift. Scattered subcortical and periventricular white matter hypodensities, nonspecific but likely sequela of chronic small vessel ischemic disease. Vascular: No hyperdense vessel or unexpected calcification. Skull: Negative for acute fracture. Sinuses/Orbits: There is complete opacification of the right sphenoid sinus. Minimal ethmoid air cell mucosal thickening. The mastoid air cells are clear. The orbits are unremarkable. Other: None. 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 No priors for comparison. No evidence of an acute component. Mild sequela of chronic small vessel ischemic disease. Electronically Signed   By: Caprice Renshaw M.D.   On: 04/02/2022 09:09   DG Chest 2 View  Result Date: 04/02/2022 CLINICAL DATA:  71 year old female with suspected sepsis. Foot wound. EXAM: CHEST - 2 VIEW COMPARISON:  None Available. FINDINGS: Upright AP and lateral views of the chest. AP view mildly rotated to the right. Low lung volumes. There is cardiomegaly. Other mediastinal contours are within normal limits. Visualized tracheal air column is within normal limits. No pneumothorax, pulmonary edema, pleural effusion, or consolidation identified. Paucity of bowel gas in the upper abdomen. Osteopenia and thoracic kyphosis. No acute osseous abnormality identified. IMPRESSION: Low lung volumes.  No acute cardiopulmonary abnormality. Electronically Signed   By: Odessa Fleming M.D.   On: 04/02/2022 08:16    Assessment/Plan:   Principal Problem:   Necrotizing fasciitis (HCC) Active Problems:   Hyponatremia   Hypotension   Patient Summary: Jill Munoz is a 71 y.o. with no known PMHx, who presented with worsening left hemorrhagic bulla of left leg  and admitted for necrotizing  fasciitis of left leg, S. Pyogenes bacteremia, hypotonic hypovolemic hyponatremia and uncontrolled T2DM.   # Necrotizing Fasciitis of left leg Wound vac with 325 ml of serosanguinous drainage. Will require multiple debridements.  Dr. Lajoyce Corners has been consulted and plan for further debridement versus amputation tomorrow. - Telemetry monitoring - Orthopedic surgery following; appreciate their recommendations and assistance - Continue penicillin and linezolid (day 2 of abx) - Tylenol PRN for fever, mild pain  - Norco PRN for severe pain - Blood and surgical cultures pending - RD consult for dietary needs  #S. Pyogenes bacteremia BCID shows S. Pyogenes. Switched from zosyn to penicillin q4h. Likely source of infection from left leg wound. Tachycardia and tachypnea likely secondary to bacteremia. -Continue abx as above -Repeat Bcx tomorrow  -ID following, greatly appreciate recommendations and assistance -Delirium precautions given she is at high risk for hospital delirium - TTE today and TEE (Thursday) to r/o endocarditis    #Hypotonic Hypovolemic Hyponatremia  Multifactorial likely due to necrotizing fasciitis, bacteremia, poor PO intake. Most recent sodium 125. There is a possibility she has chronic euvolemic hyponatremia that had become acutely hypovolemic so will repeat urine studies to assess - Space out BMP to q6h - Continue treating hypovolemia with LR @ 125 cc/hr  - Maximum Na goal of 129 by 6/21 @ 5 AM - If elevated between 130-132, will likely need to stop IVF - If markedly above goal (>132), will likely need Desmopressin and Nephrology consultation - Repeat Urine osm, sodium to reassess if possible euvolemic hyponatremia - AM cortisol ordered   # Hypotension Likely 2/2 to poor PO intake and bacteremia/nec fasc. BP has been responsive to IVF resuscitation; so far has received ~3.6 L. Most recent BP 127/68 Pressor support required intra-op only; BP has been stable post-op off  pressor support.  - Continue IVF resuscitation at 125 cc/hr    # Acute Kidney Injury # Neurogenic Bladder Cr 1.06>1.2. Bladder scan showed >999 ml in bladder. I and O cath performed. Initially prerenal but now component of postrenal. Likely will require foley but will do one more bladder scan. -Repeat bladder scan at 18:00, foley if still retaining.  - Continue monitoring renal function daily - Continue IVF resuscitation - UA from I and O cath - May require MRI given neurogenic bladder and to  r/o stroke given she is in a hypercoagulable state given bacteremia   # Anion Gap Metabolic Acidosis  Stable bicarb 19.  - Monitor bicarb daily - No indication for bicarb infusion   # Type 2 Diabetes Mellitus  A1c 11%.   -Start Semglee 5 u - SSI, moderate and CBG qac and qhs   # Iron Deficiency Anemia # Microcytic Anemia  No known prior history. Iron saturation ratio 3%. - Monitor CBC daily - Transfuse for hemoglobin < 7 - Reticulocyte ordered   # Left Subdural Hygroma  Incidental finding on CT head. Given bacteremia, needs monitoring if it turns into an abscess   # Diabetes induced neuropathy # Chronic Hand Numbness Given A1c elevation, I suspect this may be secondary to peripheral neuropathy in the setting of uncontrolled diabetes. B12 exorbitantly high at 4,868 and TSH 0.861. - Outpatient follow up recommended  - TOC to establish PCP needs  Diet: Carb-Modified IVF: LR, 125 cc/hr VTE: None today Code: Full PT/OT recs: Pending, none. TOC recs: pending   Dispo: Anticipated discharge to Skilled nursing facility in 5 days pending debridement of LLE, management of bacteremia, good insulin regiment, resolution of hyponatremia.   Park Pope, MD 04/03/2022, 6:48 AM Pager: [phone]  Please contact the on call pager after 5 pm and on weekends at [phone].

## 2022-04-03 NOTE — Consult Note (Signed)
[facility]    Date of Admission:  04/02/2022      Total days of antibiotics 1   PCN G   Linezolid  Zosyn               Reason for Consult: necrotizing fasciitis left leg    Referring Provider: IMT Primary Care Provider: Pcp, No    Assessment: Jill Munoz is a 71 y.o. female with recently diagnosed T2DM (A1C 11%) and no other recognized pmhx here with necrotizing fasciitis involving the left leg due to streptococcus pyogenes now POD1 from initial debridement. Plans for further surgical management with Dr. Lajoyce Corners today. Secondary bacteremia due to infection - agree with penicillin + linezolid for management. Plan to stop linezolid at 48h. She has had evolution of new darkened blistering to anterior knee and some more serous blistering to anterior shin under the op-site from wound vac - will send message to Dr. Lajoyce Corners to update. Plans for OR tomorrow 6/21 for further debridement.   AKI - creatinine uptrending today to 1.2. Will try to limit nephrotoxic abx. IMT following and adjusting fluids to correct electrolyte derangements.   Drug monitoring - PLT 240>>180 overnight. Short course of linezolid should not affect this too much.    Plan: Continue penicillin G continuous infusion  Linezolid for 48h then stop  Follow OR cultures to maturity (all seems c/w GAS at present) Follow OR findings (amputation may be of consideration)     Principal Problem:   Necrotizing fasciitis of lower leg (HCC) Active Problems:   Hyponatremia   Hypotension   Bacteremia due to Streptococcus Pyogenes   Type 2 diabetes mellitus with hyperglycemia (HCC)    insulin aspart  0-15 Units Subcutaneous TID WC   insulin glargine-yfgn  5 Units Subcutaneous Daily   sodium chloride flush  3 mL Intravenous Q12H    HPI: Jill Munoz is a 71 y.o. female admitted from home for concern over left leg ulcer and cellulitis that was getting worse.   PMHx T2DM (A1C 11%) -->  follows with a naturopathic doctor and was using a supplement for glucose management.   According to Ms. Groft, this problem arose rather quickly on Thursday evening; began with a small blister that grew and changed colors. She did not have any fevers/chills, malaise or sweating associated with it.   Clinical exam with large fluid blister along distal left lower leg and surrounding cellulitis. CT scan of the limb was obtained revealing soft tissue emphysema in the medial aspect of the distal leg within the subcutaneous fat concerning for necrotizing infection. She was taken urgently to OR overnight with Dr. Sherilyn Dacosta on 6/19 for preliminary debridement. Dr. Lajoyce Corners is following today and plans to take her back for further debridement. Initially initiated on linezolid + zosyn   Cultures x 3 taken:  #1 Abundant GPC in pairs/chains (leg tissue)  #2 Abundant GPC (leg tissue) #3 Rare GPC in pairs (bullous fluid)  Blood cultures have returned positive for streptococcus pyogenes.   Initially initiated on linezolid + zosyn --> has since been narrowed to pcn and linezolid.    Review of Systems: Review of Systems  Constitutional:  Negative for chills and fever.  Psychiatric/Behavioral:         Confused     Past Medical History:  Diagnosis Date   Pre-diabetes     Social History   Tobacco Use   Smoking status: Never   Smokeless tobacco: Never  History reviewed. No pertinent family history. No Known Allergies  OBJECTIVE: Blood pressure 127/68, pulse (!) 113, temperature 98.8 F (37.1 C), temperature source Oral, resp. rate (!) 24, height 5\' 5"  (1.651 m), weight 77.1 kg, SpO2 91 %.   Physical Exam Vitals and nursing note reviewed.  HENT:     Mouth/Throat:     Mouth: Mucous membranes are dry.     Pharynx: Oropharynx is clear.  Cardiovascular:     Rate and Rhythm: Normal rate.  Pulmonary:     Effort: Pulmonary effort is normal.     Breath sounds: Normal breath sounds.  Abdominal:      General: Bowel sounds are normal. There is no distension.  Musculoskeletal:     Comments: Blistering noted on the medial aspect of knee/upper calf that is white appearing  Darkened red blister to anterior knee   Neurological:     Mental Status: She is alert. She is disoriented.     New Blister to knee and medial knee/upper calf 6/20 -     Preop 6/19      Lab Results Lab Results  Component Value Date   WBC 6.7 04/03/2022   HGB 8.8 (L) 04/03/2022   HCT 24.6 (L) 04/03/2022   MCV 73.7 (L) 04/03/2022   PLT 180 04/03/2022    Lab Results  Component Value Date   CREATININE 1.20 (H) 04/03/2022   BUN 26 (H) 04/03/2022   NA 125 (L) 04/03/2022   K 3.7 04/03/2022   CL 94 (L) 04/03/2022   CO2 18 (L) 04/03/2022    Lab Results  Component Value Date   ALT 22 04/03/2022   AST 26 04/03/2022   ALKPHOS 41 04/03/2022   BILITOT 0.6 04/03/2022     Microbiology: Recent Results (from the past 240 hour(s))  Culture, blood (Routine x 2)     Status: Abnormal (Preliminary result)   Collection Time: 04/02/22  7:30 AM   Specimen: BLOOD LEFT HAND  Result Value Ref Range Status   Specimen Description BLOOD LEFT HAND  Final   Special Requests   Final    BOTTLES DRAWN AEROBIC AND ANAEROBIC Blood Culture results may not be optimal due to an inadequate volume of blood received in culture bottles   Culture  Setup Time   Final    GRAM POSITIVE COCCI IN BOTH AEROBIC AND ANAEROBIC BOTTLES CRITICAL RESULT CALLED TO, READ BACK BY AND VERIFIED WITH: PHARMD JAMES LEDFORD 04/02/22@22 :35 BY TW    Culture (A)  Final    GROUP A STREP (S.PYOGENES) ISOLATED SUSCEPTIBILITIES TO FOLLOW Performed at [facility], [address] [place], [place]    Report Status PENDING  Incomplete  Blood Culture ID Panel (Reflexed)     Status: Abnormal   Collection Time: 04/02/22  7:30 AM  Result Value Ref Range Status   Enterococcus faecalis NOT DETECTED NOT DETECTED Final   Enterococcus Faecium  NOT DETECTED NOT DETECTED Final   Listeria monocytogenes NOT DETECTED NOT DETECTED Final   Staphylococcus species NOT DETECTED NOT DETECTED Final   Staphylococcus aureus (BCID) NOT DETECTED NOT DETECTED Final   Staphylococcus epidermidis NOT DETECTED NOT DETECTED Final   Staphylococcus lugdunensis NOT DETECTED NOT DETECTED Final   Streptococcus species DETECTED (A) NOT DETECTED Final    Comment: CRITICAL RESULT CALLED TO, READ BACK BY AND VERIFIED WITH: PHARMD JAMES LEDFORD 04/02/22@22 :35 BY TW    Streptococcus agalactiae NOT DETECTED NOT DETECTED Final   Streptococcus pneumoniae NOT DETECTED NOT DETECTED Final  Streptococcus pyogenes DETECTED (A) NOT DETECTED Final    Comment: CRITICAL RESULT CALLED TO, READ BACK BY AND VERIFIED WITH: PHARMD JAMES LEDFORD 04/02/22@22 :35 BY TW    A.calcoaceticus-baumannii NOT DETECTED NOT DETECTED Final   Bacteroides fragilis NOT DETECTED NOT DETECTED Final   Enterobacterales NOT DETECTED NOT DETECTED Final   Enterobacter cloacae complex NOT DETECTED NOT DETECTED Final   Escherichia coli NOT DETECTED NOT DETECTED Final   Klebsiella aerogenes NOT DETECTED NOT DETECTED Final   Klebsiella oxytoca NOT DETECTED NOT DETECTED Final   Klebsiella pneumoniae NOT DETECTED NOT DETECTED Final   Proteus species NOT DETECTED NOT DETECTED Final   Salmonella species NOT DETECTED NOT DETECTED Final   Serratia marcescens NOT DETECTED NOT DETECTED Final   Haemophilus influenzae NOT DETECTED NOT DETECTED Final   Neisseria meningitidis NOT DETECTED NOT DETECTED Final   Pseudomonas aeruginosa NOT DETECTED NOT DETECTED Final   Stenotrophomonas maltophilia NOT DETECTED NOT DETECTED Final   Candida albicans NOT DETECTED NOT DETECTED Final   Candida auris NOT DETECTED NOT DETECTED Final   Candida glabrata NOT DETECTED NOT DETECTED Final   Candida krusei NOT DETECTED NOT DETECTED Final   Candida parapsilosis NOT DETECTED NOT DETECTED Final   Candida tropicalis NOT DETECTED  NOT DETECTED Final   Cryptococcus neoformans/gattii NOT DETECTED NOT DETECTED Final    Comment: Performed at [facility], [address], [place], [place] [address]  Culture, blood (Routine x 2)     Status: None (Preliminary result)   Collection Time: 04/02/22  9:37 AM   Specimen: BLOOD RIGHT HAND  Result Value Ref Range Status   Specimen Description BLOOD RIGHT HAND  Final   Special Requests   Final    BOTTLES DRAWN AEROBIC ONLY Blood Culture results may not be optimal due to an inadequate volume of blood received in culture bottles   Culture   Final    NO GROWTH < 24 HOURS Performed at [facility], [address] [place], [place] [address]    Report Status PENDING  Incomplete  Surgical pcr screen     Status: Abnormal   Collection Time: 04/02/22 11:44 AM   Specimen: Nasal Mucosa; Nasal Swab  Result Value Ref Range Status   MRSA, PCR NEGATIVE NEGATIVE Final   Staphylococcus aureus POSITIVE (A) NEGATIVE Final    Comment: (NOTE) The Xpert SA Assay (FDA approved for NASAL specimens in patients 39 years of age and older), is one component of a comprehensive surveillance program. It is not intended to diagnose infection nor to guide or monitor treatment. Performed at [facility], [address], [place], [place] [address]   Aerobic/Anaerobic Culture w Gram Stain (surgical/deep wound)     Status: None (Preliminary result)   Collection Time: 04/02/22  1:23 PM   Specimen: PATH Other; Body Fluid  Result Value Ref Range Status   Specimen Description WOUND  Final   Special Requests LEFT LEG BULLA FLUID TEST ANCEF  Final   Gram Stain   Final    NO WBC SEEN RARE GRAM POSITIVE COCCI IN PAIRS Performed at [facility], [address] [place], [place] [address]    Culture PENDING  Incomplete   Report Status PENDING  Incomplete  Aerobic/Anaerobic Culture w Gram Stain (surgical/deep wound)     Status: None (Preliminary result)   Collection Time: 04/02/22  1:30 PM    Specimen: PATH Other; Tissue  Result Value Ref Range Status   Specimen Description WOUND  Final   Special Requests LEFT LEG PROXIMAL MEDIAL DEEP TEST ANCEF  Final   Gram Stain   Final    FEW WBC PRESENT,BOTH PMN AND MONONUCLEAR ABUNDANT GRAM POSITIVE COCCI Performed at [facility], [address], [place], [place] [address]    Culture PENDING  Incomplete   Report Status PENDING  Incomplete  Aerobic/Anaerobic Culture w Gram Stain (surgical/deep wound)     Status: None (Preliminary result)   Collection Time: 04/02/22  1:32 PM   Specimen: PATH Other; Tissue  Result Value Ref Range Status   Specimen Description WOUND  Final   Special Requests LEFT LEG DISTAL MEDIAL DEEP TEST ANCEF  Final   Gram Stain   Final    NO WBC SEEN ABUNDANT GRAM POSITIVE COCCI IN PAIRS IN CHAINS Performed at [facility], [address], [place], [place] [address]    Culture PENDING  Incomplete   Report Status PENDING  Incomplete     Rexene Alberts, MSN, NP-C [facility]  [email or web address] Pager: [phone] Office: [phone] [facility] Main Line: [phone] *Secure Chat Communication Welcome

## 2022-04-03 NOTE — Consult Note (Signed)
ORTHOPAEDIC CONSULTATION  REQUESTING PHYSICIAN: Mercie Eon, MD  Chief Complaint: Ulcer swelling and cellulitis left leg.  HPI: Jill Munoz is a 71 y.o. female who presents with uncontrolled type 2 diabetes with severe protein caloric malnutrition who had necrotizing fasciitis of the left ankle.  She initially underwent surgical debridement with Dr. Sherilyn Dacosta on 04/02/2022 and is seen today for initial evaluation and treatment.  Past Medical History:  Diagnosis Date   Pre-diabetes    Past Surgical History:  Procedure Laterality Date   I & D EXTREMITY Left 04/02/2022   Procedure: IRRIGATION AND DEBRIDEMENT LEFT LOWER LEG APPLICATION OF WOUND VAC ;  Surgeon: Ernestina Columbia, MD;  Location: [facility] OR;  Service: Orthopedics;  Laterality: Left;   Social History   Socioeconomic History   Marital status: Married    Spouse name: Not on file   Number of children: Not on file   Years of education: Not on file   Highest education level: Not on file  Occupational History   Not on file  Tobacco Use   Smoking status: Never   Smokeless tobacco: Never  Substance and Sexual Activity   Alcohol use: Not on file   Drug use: Not on file   Sexual activity: Not on file  Other Topics Concern   Not on file  Social History Narrative   Not on file   Social Determinants of Health   Financial Resource Strain: Not on file  Food Insecurity: Not on file  Transportation Needs: Not on file  Physical Activity: Not on file  Stress: Not on file  Social Connections: Not on file   History reviewed. No pertinent family history. - negative except otherwise stated in the family history section No Known Allergies Prior to Admission medications   Not on File   CT EXTREMITY LOWER LEFT W WO CONTRAST  Result Date: 04/02/2022 CLINICAL DATA:  Large fluid blister along the distal left lower leg EXAM: CT OF THE LOWER LEFT EXTREMITY WITHOUT CONTRAST TECHNIQUE: Multidetector CT imaging of the lower left  extremity was performed following the standard protocol before and during bolus administration of intravenous contrast. RADIATION DOSE REDUCTION: This exam was performed according to the departmental dose-optimization program which includes automated exposure control, adjustment of the mA and/or kV according to patient size and/or use of iterative reconstruction technique. CONTRAST:  OMNIPAQUE IOHEXOL 300 MG/ML  SOLN COMPARISON:  None Available. FINDINGS: Bones/Joint/Cartilage Generalized osteopenia. No fracture or dislocation. Normal alignment. No joint effusion. Ligaments Ligaments are suboptimally evaluated by CT. Muscles and Tendons Muscles are normal. No muscle atrophy. No intramuscular fluid collection or hematoma. Soft tissue Soft tissue edema throughout the left lower leg consistent with cellulitis. Soft tissue emphysema in the medial aspect of the distal left lower leg within the subcutaneous fat. Large skin blister along the distal medial lower leg/ankle measuring 8.3 x 2.8 x 1.6 cm. Peripheral vascular atherosclerotic disease. No soft tissue mass. IMPRESSION: 1. Soft tissue edema throughout the left lower leg consistent with cellulitis. Soft tissue emphysema in the medial aspect of the distal left lower leg within the subcutaneous fat concerning for necrotizing infection. Large skin blister along the distal medial lower leg/ankle measuring 8.3 x 2.8 x 1.6 cm. Electronically Signed   By: Elige Ko M.D.   On: 04/02/2022 09:23   CT Head Wo Contrast  Result Date: 04/02/2022 CLINICAL DATA:  Numbness or tingling, paresthesia (Ped 0-17y) EXAM: CT HEAD WITHOUT CONTRAST TECHNIQUE: Contiguous axial images were obtained from the base of the  skull through the vertex without intravenous contrast. RADIATION DOSE REDUCTION: This exam was performed according to the departmental dose-optimization program which includes automated exposure control, adjustment of the mA and/or kV according to patient size and/or  use of iterative reconstruction technique. COMPARISON:  None Available. FINDINGS: Brain: There is no evidence of acute intracranial hemorrhage. There is a low density extra-axial prominence along the left cerebral hemisphere, measuring up to 1.0 cm along the left frontal lobe and 1.1 cm along the parietal lobe (coronal series 5, images 30 and 47).There is mild flattening of the adjacent cortex. No ventricular effacement. The basal cisterns are patent. No significant midline shift. Scattered subcortical and periventricular white matter hypodensities, nonspecific but likely sequela of chronic small vessel ischemic disease. Vascular: No hyperdense vessel or unexpected calcification. Skull: Negative for acute fracture. Sinuses/Orbits: There is complete opacification of the right sphenoid sinus. Minimal ethmoid air cell mucosal thickening. The mastoid air cells are clear. The orbits are unremarkable. Other: None. 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 No priors for comparison. No evidence of an acute component. Mild sequela of chronic small vessel ischemic disease. Electronically Signed   By: Caprice Renshaw M.D.   On: 04/02/2022 09:09   DG Chest 2 View  Result Date: 04/02/2022 CLINICAL DATA:  71 year old female with suspected sepsis. Foot wound. EXAM: CHEST - 2 VIEW COMPARISON:  None Available. FINDINGS: Upright AP and lateral views of the chest. AP view mildly rotated to the right. Low lung volumes. There is cardiomegaly. Other mediastinal contours are within normal limits. Visualized tracheal air column is within normal limits. No pneumothorax, pulmonary edema, pleural effusion, or consolidation identified. Paucity of bowel gas in the upper abdomen. Osteopenia and thoracic kyphosis. No acute  osseous abnormality identified. IMPRESSION: Low lung volumes.  No acute cardiopulmonary abnormality. Electronically Signed   By: Odessa Fleming M.D.   On: 04/02/2022 08:16   - pertinent xrays, CT, MRI studies were reviewed and independently interpreted  Positive ROS: All other systems have been reviewed and were otherwise negative with the exception of those mentioned in the HPI and as above.  Physical Exam: General: Alert, no acute distress Psychiatric: Patient is competent for consent with normal mood and affect Lymphatic: No axillary or cervical lymphadenopathy Cardiovascular: No pedal edema Respiratory: No cyanosis, no use of accessory musculature GI: No organomegaly, abdomen is soft and non-tender    Images:  @ENCIMAGES @  Labs:  Lab Results  Component Value Date   HGBA1C 11.0 (H) 04/02/2022   REPTSTATUS PENDING 04/02/2022   GRAMSTAIN  04/02/2022    NO WBC SEEN ABUNDANT GRAM POSITIVE COCCI IN PAIRS IN CHAINS Performed at [facility], [address], [place], [place]    CULT PENDING 04/02/2022    Lab Results  Component Value Date   ALBUMIN 1.7 (L) 04/03/2022   ALBUMIN 2.1 (L) 04/02/2022        Latest Ref Rng & Units 04/03/2022    5:03 AM 04/02/2022   12:16 PM 04/02/2022    7:50 AM  CBC EXTENDED  WBC 4.0 - 10.5 K/uL 6.7     RBC 3.87 - 5.11 MIL/uL 3.34     Hemoglobin 12.0 - 15.0 g/dL 8.8  04/04/2022  8.8   HCT 43.3 - 46.0 % 24.6  30.0  26.0   Platelets 150 -  400 K/uL 180     NEUT# 1.7 - 7.7 K/uL 6.2     Lymph# 0.7 - 4.0 K/uL 0.2       Neurologic: Patient does not have protective sensation bilateral lower extremities.   MUSCULOSKELETAL:   Skin: Examination patient initially had blisters and necrotic skin around the left ankle.  This underwent initial debridement she currently has a wound VAC in place.  Initial blood cultures are showing group a strep consistent with necrotizing fasciitis.  White cell count 6.7 hemoglobin 8.8.  Albumin 1.7 and a  hemoglobin A1c of 11.0.  Patient has an absolute neutrophil to lymphocyte ratio of 31.  Assessment: Assessment: Uncontrolled type 2 diabetes with severe protein caloric malnutrition with group a strep necrotizing fasciitis of the left foot and ankle status post initial debridement.  Plan: Plan: We will plan for return to the operating room tomorrow plan for further debridement versus amputation.  Thank you for the consult and the opportunity to see Ms. Cordner  Jandiel Magallanes, MD [facility] [phone] 8:45 AM      

## 2022-04-03 NOTE — H&P (View-Only) (Signed)
ORTHOPAEDIC CONSULTATION  REQUESTING PHYSICIAN: Mercie Eon, MD  Chief Complaint: Ulcer swelling and cellulitis left leg.  HPI: Jill Munoz is a 71 y.o. female who presents with uncontrolled type 2 diabetes with severe protein caloric malnutrition who had necrotizing fasciitis of the left ankle.  She initially underwent surgical debridement with Dr. Sherilyn Dacosta on 04/02/2022 and is seen today for initial evaluation and treatment.  Past Medical History:  Diagnosis Date   Pre-diabetes    Past Surgical History:  Procedure Laterality Date   I & D EXTREMITY Left 04/02/2022   Procedure: IRRIGATION AND DEBRIDEMENT LEFT LOWER LEG APPLICATION OF WOUND VAC ;  Surgeon: Ernestina Columbia, MD;  Location: [facility] OR;  Service: Orthopedics;  Laterality: Left;   Social History   Socioeconomic History   Marital status: Married    Spouse name: Not on file   Number of children: Not on file   Years of education: Not on file   Highest education level: Not on file  Occupational History   Not on file  Tobacco Use   Smoking status: Never   Smokeless tobacco: Never  Substance and Sexual Activity   Alcohol use: Not on file   Drug use: Not on file   Sexual activity: Not on file  Other Topics Concern   Not on file  Social History Narrative   Not on file   Social Determinants of Health   Financial Resource Strain: Not on file  Food Insecurity: Not on file  Transportation Needs: Not on file  Physical Activity: Not on file  Stress: Not on file  Social Connections: Not on file   History reviewed. No pertinent family history. - negative except otherwise stated in the family history section No Known Allergies Prior to Admission medications   Not on File   CT EXTREMITY LOWER LEFT W WO CONTRAST  Result Date: 04/02/2022 CLINICAL DATA:  Large fluid blister along the distal left lower leg EXAM: CT OF THE LOWER LEFT EXTREMITY WITHOUT CONTRAST TECHNIQUE: Multidetector CT imaging of the lower left  extremity was performed following the standard protocol before and during bolus administration of intravenous contrast. RADIATION DOSE REDUCTION: This exam was performed according to the departmental dose-optimization program which includes automated exposure control, adjustment of the mA and/or kV according to patient size and/or use of iterative reconstruction technique. CONTRAST:  OMNIPAQUE IOHEXOL 300 MG/ML  SOLN COMPARISON:  None Available. FINDINGS: Bones/Joint/Cartilage Generalized osteopenia. No fracture or dislocation. Normal alignment. No joint effusion. Ligaments Ligaments are suboptimally evaluated by CT. Muscles and Tendons Muscles are normal. No muscle atrophy. No intramuscular fluid collection or hematoma. Soft tissue Soft tissue edema throughout the left lower leg consistent with cellulitis. Soft tissue emphysema in the medial aspect of the distal left lower leg within the subcutaneous fat. Large skin blister along the distal medial lower leg/ankle measuring 8.3 x 2.8 x 1.6 cm. Peripheral vascular atherosclerotic disease. No soft tissue mass. IMPRESSION: 1. Soft tissue edema throughout the left lower leg consistent with cellulitis. Soft tissue emphysema in the medial aspect of the distal left lower leg within the subcutaneous fat concerning for necrotizing infection. Large skin blister along the distal medial lower leg/ankle measuring 8.3 x 2.8 x 1.6 cm. Electronically Signed   By: Elige Ko M.D.   On: 04/02/2022 09:23   CT Head Wo Contrast  Result Date: 04/02/2022 CLINICAL DATA:  Numbness or tingling, paresthesia (Ped 0-17y) EXAM: CT HEAD WITHOUT CONTRAST TECHNIQUE: Contiguous axial images were obtained from the base of the  skull through the vertex without intravenous contrast. RADIATION DOSE REDUCTION: This exam was performed according to the departmental dose-optimization program which includes automated exposure control, adjustment of the mA and/or kV according to patient size and/or  use of iterative reconstruction technique. COMPARISON:  None Available. FINDINGS: Brain: There is no evidence of acute intracranial hemorrhage. There is a low density extra-axial prominence along the left cerebral hemisphere, measuring up to 1.0 cm along the left frontal lobe and 1.1 cm along the parietal lobe (coronal series 5, images 30 and 47).There is mild flattening of the adjacent cortex. No ventricular effacement. The basal cisterns are patent. No significant midline shift. Scattered subcortical and periventricular white matter hypodensities, nonspecific but likely sequela of chronic small vessel ischemic disease. Vascular: No hyperdense vessel or unexpected calcification. Skull: Negative for acute fracture. Sinuses/Orbits: There is complete opacification of the right sphenoid sinus. Minimal ethmoid air cell mucosal thickening. The mastoid air cells are clear. The orbits are unremarkable. Other: None. 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 No priors for comparison. No evidence of an acute component. Mild sequela of chronic small vessel ischemic disease. Electronically Signed   By: Caprice Renshaw M.D.   On: 04/02/2022 09:09   DG Chest 2 View  Result Date: 04/02/2022 CLINICAL DATA:  71 year old female with suspected sepsis. Foot wound. EXAM: CHEST - 2 VIEW COMPARISON:  None Available. FINDINGS: Upright AP and lateral views of the chest. AP view mildly rotated to the right. Low lung volumes. There is cardiomegaly. Other mediastinal contours are within normal limits. Visualized tracheal air column is within normal limits. No pneumothorax, pulmonary edema, pleural effusion, or consolidation identified. Paucity of bowel gas in the upper abdomen. Osteopenia and thoracic kyphosis. No acute  osseous abnormality identified. IMPRESSION: Low lung volumes.  No acute cardiopulmonary abnormality. Electronically Signed   By: Odessa Fleming M.D.   On: 04/02/2022 08:16   - pertinent xrays, CT, MRI studies were reviewed and independently interpreted  Positive ROS: All other systems have been reviewed and were otherwise negative with the exception of those mentioned in the HPI and as above.  Physical Exam: General: Alert, no acute distress Psychiatric: Patient is competent for consent with normal mood and affect Lymphatic: No axillary or cervical lymphadenopathy Cardiovascular: No pedal edema Respiratory: No cyanosis, no use of accessory musculature GI: No organomegaly, abdomen is soft and non-tender    Images:  @ENCIMAGES @  Labs:  Lab Results  Component Value Date   HGBA1C 11.0 (H) 04/02/2022   REPTSTATUS PENDING 04/02/2022   GRAMSTAIN  04/02/2022    NO WBC SEEN ABUNDANT GRAM POSITIVE COCCI IN PAIRS IN CHAINS Performed at [facility], [address], [place], [place]    CULT PENDING 04/02/2022    Lab Results  Component Value Date   ALBUMIN 1.7 (L) 04/03/2022   ALBUMIN 2.1 (L) 04/02/2022        Latest Ref Rng & Units 04/03/2022    5:03 AM 04/02/2022   12:16 PM 04/02/2022    7:50 AM  CBC EXTENDED  WBC 4.0 - 10.5 K/uL 6.7     RBC 3.87 - 5.11 MIL/uL 3.34     Hemoglobin 12.0 - 15.0 g/dL 8.8  04/04/2022  8.8   HCT 43.3 - 46.0 % 24.6  30.0  26.0   Platelets 150 -  400 K/uL 180     NEUT# 1.7 - 7.7 K/uL 6.2     Lymph# 0.7 - 4.0 K/uL 0.2       Neurologic: Patient does not have protective sensation bilateral lower extremities.   MUSCULOSKELETAL:   Skin: Examination patient initially had blisters and necrotic skin around the left ankle.  This underwent initial debridement she currently has a wound VAC in place.  Initial blood cultures are showing group a strep consistent with necrotizing fasciitis.  White cell count 6.7 hemoglobin 8.8.  Albumin 1.7 and a  hemoglobin A1c of 11.0.  Patient has an absolute neutrophil to lymphocyte ratio of 31.  Assessment: Assessment: Uncontrolled type 2 diabetes with severe protein caloric malnutrition with group a strep necrotizing fasciitis of the left foot and ankle status post initial debridement.  Plan: Plan: We will plan for return to the operating room tomorrow plan for further debridement versus amputation.  Thank you for the consult and the opportunity to see Ms. Defenbaugh  Aldean Baker, MD [facility] [phone] 8:45 AM

## 2022-04-03 NOTE — Evaluation (Signed)
Physical Therapy Evaluation Patient Details Name: Jill Munoz MRN: [id number] DOB: 01/27/1951 Today's Date: 04/03/2022  History of Present Illness  71 yo female admitted 04/02/22 with Lt leg cellulitis/Necrotizing fascitis, s/p I&D same date with wound VAC placement. Pt also with hyponatremia and hypotension, T2DM, left eye vision loss. PMhx: unknown sees a naturopathic doctor  Clinical Impression  Pt presents with cognitive, mobility, and balance deficits due to the above HPI. Pt lives in a 3 level house with her husband who takes care of her. PTA pt reports ambulating with AD's at home and in community. Currently pt requires Mod assist for bed mobility and Max assist for transfers. No AD's were used in today's session, however pt required 2 person assist for mobility. Pt will benefit from continued skilled therapy to address mobility and balance impairments by facilitating strengthening exercises, practicing gait, and instructing therapeutic activities. Recommending SNF upon DC from hospital. Will progress as able.         Recommendations for follow up therapy are one component of a multi-disciplinary discharge planning process, led by the attending physician.  Recommendations may be updated based on patient status, additional functional criteria and insurance authorization.  Follow Up Recommendations Skilled nursing-short term rehab (<3 hours/day)    Assistance Recommended at Discharge Frequent or constant Supervision/Assistance  Patient can return home with the following  A lot of help with walking and/or transfers;A lot of help with bathing/dressing/bathroom;Assistance with cooking/housework;Assistance with feeding;Direct supervision/assist for medications management;Direct supervision/assist for financial management;Assist for transportation;Help with stairs or ramp for entrance    Equipment Recommendations None recommended by PT  Recommendations for Other Services       Functional  Status Assessment Patient has had a recent decline in their functional status and demonstrates the ability to make significant improvements in function in a reasonable and predictable amount of time.     Precautions / Restrictions Precautions Precautions: Fall Precaution Comments: VAC Restrictions Weight Bearing Restrictions: No RLE Weight Bearing: Weight bearing as tolerated LLE Weight Bearing: Weight bearing as tolerated      Mobility  Bed Mobility Overal bed mobility: Needs Assistance Bed Mobility: Supine to Sit     Supine to sit: Mod assist, HOB elevated, Modified independent (Device/Increase time)     General bed mobility comments: Pt required tactile cues for hand placement on rails. Pt required leg lift assist and mod assist for upright trunk transition to EOB.    Transfers Overall transfer level: Needs assistance Equipment used: None Transfers: Sit to/from Stand, Bed to chair/wheelchair/BSC Sit to Stand: Max assist, +2 physical assistance Stand pivot transfers: Mod assist, +2 physical assistance         General transfer comment: Pt required Max assist for initial sit to stand with 2 person arm assist. Pt required Mod assist for stand pivot transfer from bed to chair with lt foot lift assist from therapist. Pt completed total 3 sit to stands.    Ambulation/Gait                  Stairs            Wheelchair Mobility    Modified Rankin (Stroke Patients Only)       Balance Overall balance assessment: Needs assistance Sitting-balance support: Feet supported, Bilateral upper extremity supported Sitting balance-Leahy Scale: Poor Sitting balance - Comments: Pt relied on intermittant hand prop assist from therapist Postural control: Other (comment) (forward lean) Standing balance support: Bilateral upper extremity supported, During functional activity Standing balance-Leahy Scale: Zero  Standing balance comment: Pt relied on 2 person assist to  maintain balance.                             Pertinent Vitals/Pain      Home Living Family/patient expects to be discharged to:: Private residence Living Arrangements: Spouse/significant other Available Help at Discharge: Family Type of Home: House Home Access: Stairs to enter       Home Layout: Multi-level Home Equipment: Agricultural consultant (2 wheels);Rollator (4 wheels);Shower seat Additional Comments: Pt unreliable to give accurate information.    Prior Function Prior Level of Function : Needs assist  Cognitive Assist : Mobility (cognitive)     Physical Assist : Mobility (physical) Mobility (physical): Transfers;Gait   Mobility Comments: Pt reported using RW and rollator at home and in community       Hand Dominance        Extremity/Trunk Assessment   Upper Extremity Assessment Upper Extremity Assessment: Defer to OT evaluation    Lower Extremity Assessment Lower Extremity Assessment: Generalized weakness;LLE deficits/detail LLE Deficits / Details: Pt presented with significant skin breakdown in Lt lower leg, unable to move to EOB.    Cervical / Trunk Assessment Cervical / Trunk Assessment: Kyphotic  Communication   Communication: Prefers language other than English;Receptive difficulties;Expressive difficulties;HOH  Cognition Arousal/Alertness: Awake/alert Behavior During Therapy: WFL for tasks assessed/performed Overall Cognitive Status: Impaired/Different from baseline Area of Impairment: Memory, Attention, Following commands, Safety/judgement, Awareness, Problem solving, Orientation                 Orientation Level: Disoriented to, Time, Place Current Attention Level: Selective Memory: Decreased recall of precautions Following Commands: Follows one step commands inconsistently, Follows one step commands with increased time, Follows multi-step commands inconsistently, Follows multi-step commands with increased time Safety/Judgement:  Decreased awareness of deficits Awareness: Emergent Problem Solving: Requires tactile cues, Difficulty sequencing, Requires verbal cues General Comments: Pt answered our questions 50% of the time, struggling to comprehend nature of questions. Pt fixated on speaking husband's phone number in response to different questions we asked.        General Comments      Exercises     Assessment/Plan    PT Assessment Patient needs continued PT services  PT Problem List Decreased strength;Decreased activity tolerance;Decreased balance;Decreased mobility;Decreased cognition;Decreased safety awareness;Decreased knowledge of precautions;Cardiopulmonary status limiting activity;Impaired sensation;Decreased skin integrity       PT Treatment Interventions DME instruction;Stair training;Gait training;Functional mobility training;Therapeutic activities;Balance training;Neuromuscular re-education;Cognitive remediation;Patient/family education    PT Goals (Current goals can be found in the Care Plan section)  Acute Rehab PT Goals Patient Stated Goal: To go home to husband PT Goal Formulation: With patient Time For Goal Achievement: 04/17/22 Potential to Achieve Goals: Good    Frequency Min 3X/week     Co-evaluation               AM-PAC PT "6 Clicks" Mobility  Outcome Measure Help needed turning from your back to your side while in a flat bed without using bedrails?: A Lot Help needed moving from lying on your back to sitting on the side of a flat bed without using bedrails?: A Lot Help needed moving to and from a bed to a chair (including a wheelchair)?: Total Help needed standing up from a chair using your arms (e.g., wheelchair or bedside chair)?: Total Help needed to walk in hospital room?: Total Help needed climbing 3-5 steps with a railing? :  Total 6 Click Score: 8    End of Session Equipment Utilized During Treatment: Gait belt;Oxygen Activity Tolerance: Patient tolerated  treatment well Patient left: in chair;with call bell/phone within reach;with chair alarm set Nurse Communication: Mobility status PT Visit Diagnosis: Unsteadiness on feet (R26.81);Other abnormalities of gait and mobility (R26.89);Muscle weakness (generalized) (M62.81)    Time: 4193-7902 PT Time Calculation (min) (ACUTE ONLY): 28 min   Charges:   PT Evaluation $PT Eval Moderate Complexity: 1 Mod PT Treatments $Therapeutic Activity: 8-22 mins       Lenn Cal, SPT Acute Rehab [facility] 04/03/2022, 11:50 AM

## 2022-04-04 ENCOUNTER — Encounter (HOSPITAL_COMMUNITY)
Admission: EM | Disposition: A | Discharge status: Skilled Nursing Facility | Payer: Self-pay | Source: Home / Self Care | Attending: Internal Medicine

## 2022-04-04 ENCOUNTER — Encounter (HOSPITAL_COMMUNITY): Payer: Self-pay | Admitting: Internal Medicine

## 2022-04-04 ENCOUNTER — Inpatient Hospital Stay (HOSPITAL_COMMUNITY): Payer: Medicare Other | Admitting: Anesthesiology

## 2022-04-04 DIAGNOSIS — E1165 Type 2 diabetes mellitus with hyperglycemia: Secondary | ICD-10-CM | POA: Diagnosis not present

## 2022-04-04 DIAGNOSIS — Z794 Long term (current) use of insulin: Secondary | ICD-10-CM

## 2022-04-04 DIAGNOSIS — E871 Hypo-osmolality and hyponatremia: Secondary | ICD-10-CM | POA: Diagnosis not present

## 2022-04-04 DIAGNOSIS — D649 Anemia, unspecified: Secondary | ICD-10-CM | POA: Diagnosis not present

## 2022-04-04 DIAGNOSIS — E119 Type 2 diabetes mellitus without complications: Secondary | ICD-10-CM

## 2022-04-04 DIAGNOSIS — M726 Necrotizing fasciitis: Secondary | ICD-10-CM

## 2022-04-04 DIAGNOSIS — B955 Unspecified streptococcus as the cause of diseases classified elsewhere: Secondary | ICD-10-CM | POA: Diagnosis not present

## 2022-04-04 DIAGNOSIS — R7881 Bacteremia: Secondary | ICD-10-CM | POA: Diagnosis not present

## 2022-04-04 LAB — CULTURE, BLOOD (ROUTINE X 2)

## 2022-04-04 LAB — GLUCOSE, CAPILLARY
Glucose-Capillary: 109 mg/dL — ABNORMAL HIGH (ref 70–99)
Glucose-Capillary: 143 mg/dL — ABNORMAL HIGH (ref 70–99)
Glucose-Capillary: 123 mg/dL — ABNORMAL HIGH (ref 70–99)
Glucose-Capillary: 130 mg/dL — ABNORMAL HIGH (ref 70–99)
Glucose-Capillary: 139 mg/dL — ABNORMAL HIGH (ref 70–99)
Glucose-Capillary: 167 mg/dL — ABNORMAL HIGH (ref 70–99)

## 2022-04-04 LAB — BASIC METABOLIC PANEL
Anion gap: 12 (ref 5–15)
Anion gap: 12 (ref 5–15)
Anion gap: 13 (ref 5–15)
BUN: 24 mg/dL — ABNORMAL HIGH (ref 8–23)
BUN: 27 mg/dL — ABNORMAL HIGH (ref 8–23)
BUN: 28 mg/dL — ABNORMAL HIGH (ref 8–23)
CO2: 17 mmol/L — ABNORMAL LOW (ref 22–32)
CO2: 18 mmol/L — ABNORMAL LOW (ref 22–32)
CO2: 19 mmol/L — ABNORMAL LOW (ref 22–32)
Calcium: 7.4 mg/dL — ABNORMAL LOW (ref 8.9–10.3)
Calcium: 7.4 mg/dL — ABNORMAL LOW (ref 8.9–10.3)
Calcium: 7.7 mg/dL — ABNORMAL LOW (ref 8.9–10.3)
Chloride: 92 mmol/L — ABNORMAL LOW (ref 98–111)
Chloride: 94 mmol/L — ABNORMAL LOW (ref 98–111)
Chloride: 95 mmol/L — ABNORMAL LOW (ref 98–111)
Creatinine, Ser: 1.16 mg/dL — ABNORMAL HIGH (ref 0.44–1.00)
Creatinine, Ser: 1.28 mg/dL — ABNORMAL HIGH (ref 0.44–1.00)
Creatinine, Ser: 1.31 mg/dL — ABNORMAL HIGH (ref 0.44–1.00)
GFR, Estimated: 44 mL/min — ABNORMAL LOW (ref >60)
GFR, Estimated: 45 mL/min — ABNORMAL LOW (ref >60)
GFR, Estimated: 51 mL/min — ABNORMAL LOW (ref >60)
Glucose, Bld: 113 mg/dL — ABNORMAL HIGH (ref 70–99)
Glucose, Bld: 141 mg/dL — ABNORMAL HIGH (ref 70–99)
Glucose, Bld: 157 mg/dL — ABNORMAL HIGH (ref 70–99)
Potassium: 3.5 mmol/L (ref 3.5–5.1)
Potassium: 3.6 mmol/L (ref 3.5–5.1)
Potassium: 3.6 mmol/L (ref 3.5–5.1)
Sodium: 122 mmol/L — ABNORMAL LOW (ref 135–145)
Sodium: 125 mmol/L — ABNORMAL LOW (ref 135–145)
Sodium: 125 mmol/L — ABNORMAL LOW (ref 135–145)

## 2022-04-04 LAB — DIFFERENTIAL
Abs Immature Granulocytes: 0 10*3/uL (ref 0.00–0.07)
Band Neutrophils: 0 %
Basophils Absolute: 0 10*3/uL (ref 0.0–0.1)
Basophils Relative: 0 %
Blasts: 0 %
Eosinophils Absolute: 0 10*3/uL (ref 0.0–0.5)
Eosinophils Relative: 0 %
Lymphocytes Relative: 3 %
Lymphs Abs: 0.5 10*3/uL — ABNORMAL LOW (ref 0.7–4.0)
Metamyelocytes Relative: 0 %
Monocytes Absolute: 0.2 10*3/uL (ref 0.1–1.0)
Monocytes Relative: 1 %
Myelocytes: 0 %
Neutro Abs: 16.1 10*3/uL — ABNORMAL HIGH (ref 1.7–7.7)
Neutrophils Relative %: 96 %
Other: 0 %
Promyelocytes Relative: 0 %
nRBC: 0 /100 WBC

## 2022-04-04 LAB — CBC
HCT: 23.3 % — ABNORMAL LOW (ref 36.0–46.0)
HCT: 23.8 % — ABNORMAL LOW (ref 36.0–46.0)
Hemoglobin: 8.2 g/dL — ABNORMAL LOW (ref 12.0–15.0)
Hemoglobin: 8.4 g/dL — ABNORMAL LOW (ref 12.0–15.0)
MCH: 26.1 pg (ref 26.0–34.0)
MCH: 26.4 pg (ref 26.0–34.0)
MCHC: 35.2 g/dL (ref 30.0–36.0)
MCHC: 35.3 g/dL (ref 30.0–36.0)
MCV: 74.2 fL — ABNORMAL LOW (ref 80.0–100.0)
MCV: 74.8 fL — ABNORMAL LOW (ref 80.0–100.0)
Platelets: 135 10*3/uL — ABNORMAL LOW (ref 150–400)
Platelets: 127 10*3/uL — ABNORMAL LOW (ref 150–400)
RBC: 3.14 MIL/uL — ABNORMAL LOW (ref 3.87–5.11)
RBC: 3.18 MIL/uL — ABNORMAL LOW (ref 3.87–5.11)
RDW: 13.4 % (ref 11.5–15.5)
RDW: 13.4 % (ref 11.5–15.5)
WBC: 16.9 10*3/uL — ABNORMAL HIGH (ref 4.0–10.5)
WBC: 16.8 10*3/uL — ABNORMAL HIGH (ref 4.0–10.5)
nRBC: 0.2 % (ref 0.0–0.2)
nRBC: 0.2 % (ref 0.0–0.2)

## 2022-04-04 LAB — PREPARE RBC (CROSSMATCH)

## 2022-04-04 SURGERY — IRRIGATION AND DEBRIDEMENT EXTREMITY
Anesthesia: General | Laterality: Left

## 2022-04-04 MED ORDER — SODIUM CHLORIDE 0.9 % IV BOLUS
250.0000 mL | Freq: Once | INTRAVENOUS | Status: AC
  Administered 2022-04-04: 250 mL via INTRAVENOUS

## 2022-04-04 MED ORDER — POTASSIUM CHLORIDE CRYS ER 20 MEQ PO TBCR: 20.0000 meq | EXTENDED_RELEASE_TABLET | Freq: Every day | ORAL | Status: DC | PRN

## 2022-04-04 MED ORDER — PANTOPRAZOLE SODIUM 40 MG PO TBEC
40.0000 mg | DELAYED_RELEASE_TABLET | Freq: Every day | ORAL | Status: DC
  Filled 2022-04-04: qty 1

## 2022-04-04 MED ORDER — DEXAMETHASONE SODIUM PHOSPHATE 10 MG/ML IJ SOLN
INTRAMUSCULAR | Status: DC | PRN
  Administered 2022-04-04: 5 mg via INTRAVENOUS

## 2022-04-04 MED ORDER — CEFAZOLIN SODIUM-DEXTROSE 2-4 GM/100ML-% IV SOLN
2.0000 g | INTRAVENOUS | Status: AC
  Administered 2022-04-04: 2 g via INTRAVENOUS
  Filled 2022-04-04: qty 100

## 2022-04-04 MED ORDER — PHENYLEPHRINE 80 MCG/ML (10ML) SYRINGE FOR IV PUSH (FOR BLOOD PRESSURE SUPPORT)
PREFILLED_SYRINGE | INTRAVENOUS | Status: DC | PRN
  Administered 2022-04-04: 80 ug via INTRAVENOUS

## 2022-04-04 MED ORDER — OXYCODONE HCL 5 MG PO TABS: 5.0000 mg | ORAL_TABLET | Freq: Once | ORAL | Status: DC | PRN

## 2022-04-04 MED ORDER — METOPROLOL TARTRATE 5 MG/5ML IV SOLN: 2.0000 mg | INTRAVENOUS | Status: DC | PRN

## 2022-04-04 MED ORDER — CEFAZOLIN SODIUM-DEXTROSE 2-4 GM/100ML-% IV SOLN
2.0000 g | Freq: Three times a day (TID) | INTRAVENOUS | Status: AC
  Administered 2022-04-04 – 2022-04-05 (×2): 2 g via INTRAVENOUS
  Filled 2022-04-04 (×2): qty 100

## 2022-04-04 MED ORDER — CHLORHEXIDINE GLUCONATE CLOTH 2 % EX PADS
6.0000 | MEDICATED_PAD | Freq: Every day | CUTANEOUS | Status: DC
  Administered 2022-04-04 – 2022-04-09 (×5): 6 via TOPICAL

## 2022-04-04 MED ORDER — ENSURE ENLIVE PO LIQD
237.0000 mL | Freq: Three times a day (TID) | ORAL | Status: DC
  Administered 2022-04-05 – 2022-04-15 (×13): 237 mL via ORAL

## 2022-04-04 MED ORDER — SODIUM CHLORIDE 0.9 % IV SOLN
INTRAVENOUS | Status: DC
  Filled 2022-04-04 (×3): qty 476

## 2022-04-04 MED ORDER — ONDANSETRON HCL 4 MG/2ML IJ SOLN
INTRAMUSCULAR | Status: AC
  Filled 2022-04-04: qty 2

## 2022-04-04 MED ORDER — ASCORBIC ACID 500 MG PO TABS: 500.0000 mg | ORAL_TABLET | Freq: Two times a day (BID) | ORAL | Status: DC

## 2022-04-04 MED ORDER — ASCORBIC ACID 500 MG PO TABS
1000.0000 mg | ORAL_TABLET | Freq: Every day | ORAL | Status: DC
  Filled 2022-04-04: qty 2

## 2022-04-04 MED ORDER — SODIUM CHLORIDE 0.9 % IV SOLN: INTRAVENOUS | Status: DC

## 2022-04-04 MED ORDER — FENTANYL CITRATE (PF) 100 MCG/2ML IJ SOLN
INTRAMUSCULAR | Status: DC | PRN
  Administered 2022-04-04: 50 ug via INTRAVENOUS
  Administered 2022-04-04 (×2): 25 ug via INTRAVENOUS

## 2022-04-04 MED ORDER — ROCURONIUM BROMIDE 100 MG/10ML IV SOLN
INTRAVENOUS | Status: DC | PRN
  Administered 2022-04-04: 50 mg via INTRAVENOUS

## 2022-04-04 MED ORDER — PENICILLIN G POTASSIUM IV (FOR PTA / DISCHARGE USE ONLY): INTRAVENOUS | Status: DC

## 2022-04-04 MED ORDER — ZINC SULFATE 220 (50 ZN) MG PO CAPS
220.0000 mg | ORAL_CAPSULE | Freq: Every day | ORAL | Status: DC
  Filled 2022-04-04: qty 1

## 2022-04-04 MED ORDER — CHLORHEXIDINE GLUCONATE 0.12 % MT SOLN
OROMUCOSAL | Status: AC
  Administered 2022-04-04: 15 mL via OROMUCOSAL
  Filled 2022-04-04: qty 15

## 2022-04-04 MED ORDER — LIDOCAINE 2% (20 MG/ML) 5 ML SYRINGE
INTRAMUSCULAR | Status: DC | PRN
  Administered 2022-04-04: 100 mg via INTRAVENOUS

## 2022-04-04 MED ORDER — HYDROMORPHONE HCL 1 MG/ML IJ SOLN
0.5000 mg | INTRAMUSCULAR | Status: DC | PRN
  Administered 2022-04-04: 1 mg via INTRAVENOUS
  Administered 2022-04-04: 0.5 mg via INTRAVENOUS
  Administered 2022-04-05: 1 mg via INTRAVENOUS
  Filled 2022-04-04: qty 0.5
  Filled 2022-04-04 (×2): qty 1

## 2022-04-04 MED ORDER — MAGNESIUM SULFATE 2 GM/50ML IV SOLN: 2.0000 g | Freq: Every day | INTRAVENOUS | Status: DC | PRN

## 2022-04-04 MED ORDER — PHENOL 1.4 % MT LIQD: 1.0000 | OROMUCOSAL | Status: DC | PRN

## 2022-04-04 MED ORDER — BISACODYL 5 MG PO TBEC
5.0000 mg | DELAYED_RELEASE_TABLET | Freq: Every day | ORAL | Status: DC | PRN
Start: 2022-04-04 — End: 2022-04-06

## 2022-04-04 MED ORDER — POLYETHYLENE GLYCOL 3350 17 G PO PACK: 17.0000 g | PACK | Freq: Every day | ORAL | Status: DC | PRN

## 2022-04-04 MED ORDER — OXYCODONE HCL 5 MG PO TABS: 10.0000 mg | ORAL_TABLET | ORAL | Status: DC | PRN

## 2022-04-04 MED ORDER — ACETAMINOPHEN 325 MG PO TABS: 325.0000 mg | ORAL_TABLET | Freq: Four times a day (QID) | ORAL | Status: DC | PRN

## 2022-04-04 MED ORDER — SODIUM CHLORIDE 0.9% IV SOLUTION: Freq: Once | INTRAVENOUS | Status: AC

## 2022-04-04 MED ORDER — OXYCODONE HCL 5 MG/5ML PO SOLN: 5.0000 mg | Freq: Once | ORAL | Status: DC | PRN

## 2022-04-04 MED ORDER — ACETAMINOPHEN 325 MG PO TABS: 325.0000 mg | ORAL_TABLET | ORAL | Status: DC | PRN

## 2022-04-04 MED ORDER — INSULIN ASPART 100 UNIT/ML IJ SOLN: 0.0000 [IU] | INTRAMUSCULAR | Status: DC | PRN

## 2022-04-04 MED ORDER — LACTATED RINGERS IV SOLN: INTRAVENOUS | Status: DC

## 2022-04-04 MED ORDER — JUVEN PO PACK
1.0000 | PACK | Freq: Two times a day (BID) | ORAL | Status: DC
  Filled 2022-04-04: qty 1

## 2022-04-04 MED ORDER — PENICILLIN G POTASSIUM 20000000 UNITS IJ SOLR
12.0000 10*6.[IU] | Freq: Two times a day (BID) | INTRAVENOUS | Status: DC
  Filled 2022-04-04: qty 12

## 2022-04-04 MED ORDER — PROPOFOL 10 MG/ML IV BOLUS
INTRAVENOUS | Status: DC | PRN
  Administered 2022-04-04: 110 mg via INTRAVENOUS

## 2022-04-04 MED ORDER — SUGAMMADEX SODIUM 200 MG/2ML IV SOLN
INTRAVENOUS | Status: DC | PRN
  Administered 2022-04-04: 300 mg via INTRAVENOUS

## 2022-04-04 MED ORDER — LABETALOL HCL 5 MG/ML IV SOLN: 10.0000 mg | INTRAVENOUS | Status: DC | PRN

## 2022-04-04 MED ORDER — FENTANYL CITRATE (PF) 250 MCG/5ML IJ SOLN
INTRAMUSCULAR | Status: AC
  Filled 2022-04-04: qty 5

## 2022-04-04 MED ORDER — ONDANSETRON HCL 4 MG/2ML IJ SOLN
INTRAMUSCULAR | Status: DC | PRN
  Administered 2022-04-04: 4 mg via INTRAVENOUS

## 2022-04-04 MED ORDER — SUCCINYLCHOLINE CHLORIDE 200 MG/10ML IV SOSY
PREFILLED_SYRINGE | INTRAVENOUS | Status: AC
  Filled 2022-04-04: qty 10

## 2022-04-04 MED ORDER — FENTANYL CITRATE (PF) 100 MCG/2ML IJ SOLN: 25.0000 ug | INTRAMUSCULAR | Status: DC | PRN

## 2022-04-04 MED ORDER — PHENYLEPHRINE HCL-NACL 20-0.9 MG/250ML-% IV SOLN
INTRAVENOUS | Status: DC | PRN
  Administered 2022-04-04: 40 ug/min via INTRAVENOUS

## 2022-04-04 MED ORDER — ONDANSETRON HCL 4 MG/2ML IJ SOLN
4.0000 mg | Freq: Four times a day (QID) | INTRAMUSCULAR | Status: DC | PRN
  Administered 2022-04-05 (×2): 4 mg via INTRAVENOUS
  Filled 2022-04-04 (×2): qty 2

## 2022-04-04 MED ORDER — ORAL CARE MOUTH RINSE: 15.0000 mL | Freq: Once | OROMUCOSAL | Status: AC

## 2022-04-04 MED ORDER — ACETAMINOPHEN 160 MG/5ML PO SOLN: 325.0000 mg | ORAL | Status: DC | PRN

## 2022-04-04 MED ORDER — ONDANSETRON HCL 4 MG/2ML IJ SOLN
4.0000 mg | Freq: Once | INTRAMUSCULAR | Status: DC | PRN
Start: 2022-04-04 — End: 2022-04-04

## 2022-04-04 MED ORDER — LIDOCAINE 2% (20 MG/ML) 5 ML SYRINGE
INTRAMUSCULAR | Status: AC
Start: 2022-04-04 — End: ?
  Filled 2022-04-04: qty 15

## 2022-04-04 MED ORDER — ADULT MULTIVITAMIN W/MINERALS CH
1.0000 | ORAL_TABLET | Freq: Every day | ORAL | Status: DC
  Administered 2022-04-07 – 2022-04-13 (×5): 1 via ORAL
  Filled 2022-04-04 (×8): qty 1

## 2022-04-04 MED ORDER — HYDRALAZINE HCL 20 MG/ML IJ SOLN: 5.0000 mg | INTRAMUSCULAR | Status: DC | PRN

## 2022-04-04 MED ORDER — ALBUMIN HUMAN 5 % IV SOLN: INTRAVENOUS | Status: DC | PRN

## 2022-04-04 MED ORDER — POVIDONE-IODINE 10 % EX SWAB
2.0000 | Freq: Once | CUTANEOUS | Status: AC
  Administered 2022-04-04: 2 via TOPICAL

## 2022-04-04 MED ORDER — DOCUSATE SODIUM 100 MG PO CAPS
100.0000 mg | ORAL_CAPSULE | Freq: Every day | ORAL | Status: DC
  Filled 2022-04-04: qty 1

## 2022-04-04 MED ORDER — OXYCODONE HCL 5 MG PO TABS: 5.0000 mg | ORAL_TABLET | ORAL | Status: DC | PRN

## 2022-04-04 MED ORDER — CHLORHEXIDINE GLUCONATE 4 % EX LIQD
60.0000 mL | Freq: Once | CUTANEOUS | Status: AC
  Administered 2022-04-04: 4 via TOPICAL

## 2022-04-04 MED ORDER — GUAIFENESIN-DM 100-10 MG/5ML PO SYRP: 15.0000 mL | ORAL_SOLUTION | ORAL | Status: DC | PRN

## 2022-04-04 MED ORDER — MAGNESIUM CITRATE PO SOLN: 1.0000 | Freq: Once | ORAL | Status: DC | PRN

## 2022-04-04 MED ORDER — CHLORHEXIDINE GLUCONATE 0.12 % MT SOLN: 15.0000 mL | Freq: Once | OROMUCOSAL | Status: AC

## 2022-04-04 MED ORDER — MEPERIDINE HCL 25 MG/ML IJ SOLN: 6.2500 mg | INTRAMUSCULAR | Status: DC | PRN

## 2022-04-04 MED ORDER — SODIUM CHLORIDE 0.9 % IV SOLN
INTRAVENOUS | Status: DC
  Filled 2022-04-04 (×10): qty 500

## 2022-04-04 MED ORDER — ZINC SULFATE 220 (50 ZN) MG PO CAPS: 220.0000 mg | ORAL_CAPSULE | Freq: Every day | ORAL | Status: DC

## 2022-04-04 MED ORDER — PROPOFOL 10 MG/ML IV BOLUS
INTRAVENOUS | Status: AC
  Filled 2022-04-04: qty 20

## 2022-04-04 MED ORDER — ALUM & MAG HYDROXIDE-SIMETH 200-200-20 MG/5ML PO SUSP: 15.0000 mL | ORAL | Status: DC | PRN

## 2022-04-04 SURGICAL SUPPLY — 37 items
BAG COUNTER SPONGE SURGICOUNT (BAG) IMPLANT
BLADE SAW RECIP 87.9 MT (BLADE) ×1 IMPLANT
BLADE SURG 21 STRL SS (BLADE) ×2 IMPLANT
BNDG COHESIVE 6X5 TAN STRL LF (GAUZE/BANDAGES/DRESSINGS) IMPLANT
BNDG GAUZE ELAST 4 BULKY (GAUZE/BANDAGES/DRESSINGS) ×4 IMPLANT
COVER SURGICAL LIGHT HANDLE (MISCELLANEOUS) ×4 IMPLANT
DRAPE U-SHAPE 47X51 STRL (DRAPES) ×2 IMPLANT
DRSG ADAPTIC 3X8 NADH LF (GAUZE/BANDAGES/DRESSINGS) ×2 IMPLANT
DURAPREP 26ML APPLICATOR (WOUND CARE) ×2 IMPLANT
ELECT REM PT RETURN 9FT ADLT (ELECTROSURGICAL)
ELECTRODE REM PT RTRN 9FT ADLT (ELECTROSURGICAL) IMPLANT
GAUZE SPONGE 4X4 12PLY STRL (GAUZE/BANDAGES/DRESSINGS) ×2 IMPLANT
GLOVE BIOGEL PI IND STRL 9 (GLOVE) ×1 IMPLANT
GLOVE BIOGEL PI INDICATOR 9 (GLOVE) ×1
GLOVE SURG ORTHO 9.0 STRL STRW (GLOVE) ×2 IMPLANT
GOWN STRL REUS W/ TWL XL LVL3 (GOWN DISPOSABLE) ×2 IMPLANT
GOWN STRL REUS W/TWL XL LVL3 (GOWN DISPOSABLE) ×2
HANDPIECE INTERPULSE COAX TIP (DISPOSABLE)
KIT BASIN OR (CUSTOM PROCEDURE TRAY) ×2 IMPLANT
KIT TURNOVER KIT B (KITS) ×2 IMPLANT
MANIFOLD NEPTUNE II (INSTRUMENTS) ×2 IMPLANT
NS IRRIG 1000ML POUR BTL (IV SOLUTION) ×2 IMPLANT
PACK ORTHO EXTREMITY (CUSTOM PROCEDURE TRAY) ×2 IMPLANT
PAD ARMBOARD 7.5X6 YLW CONV (MISCELLANEOUS) ×4 IMPLANT
SET HNDPC FAN SPRY TIP SCT (DISPOSABLE) IMPLANT
SPONGE T-LAP 18X18 ~~LOC~~+RFID (SPONGE) ×2 IMPLANT
STOCKINETTE IMPERVIOUS 9X36 MD (GAUZE/BANDAGES/DRESSINGS) IMPLANT
SUT ETHILON 2 0 PSLX (SUTURE) ×3 IMPLANT
SUT SILK 2 0 (SUTURE) ×1
SUT SILK 2-0 18XBRD TIE 12 (SUTURE) IMPLANT
SUT VIC AB 1 CTX 36 (SUTURE) ×2
SUT VIC AB 1 CTX36XBRD ANBCTRL (SUTURE) IMPLANT
SWAB COLLECTION DEVICE MRSA (MISCELLANEOUS) ×2 IMPLANT
SWAB CULTURE ESWAB REG 1ML (MISCELLANEOUS) IMPLANT
TOWEL GREEN STERILE (TOWEL DISPOSABLE) ×2 IMPLANT
TUBE CONNECTING 12X1/4 (SUCTIONS) ×2 IMPLANT
YANKAUER SUCT BULB TIP NO VENT (SUCTIONS) ×2 IMPLANT

## 2022-04-04 NOTE — Progress Notes (Signed)
[facility]  Date of Admission:  04/02/2022      Total days of antibiotics 2   PCN G   Linezolid          ASSESSMENT: Jill Munoz is a 71 y.o. female with uncontrolled T2DM (A1C 11%) with necrotizing skin infection involving the left ankle with rapid development of hemorrhagic bullae. Secondary bacteremia with GAS due to this. She has undergone one debridement and plans for return trip to OR today and Friday with orthopedic team. High concern for possible amputation to control / cure infection given ascending blistering to involve the knee. Will continue current antibiotics and follow for OR findings to help with co-management of necrotizing skin infection.   Blood and all tissue cultures growing out group a strep.    PLAN: Continue pcn infusion and linezolid  Follow for OR findings    Principal Problem:   Necrotizing fasciitis (HCC) Active Problems:   Bacteremia due to Streptococcus Pyogenes   Hyponatremia   Hypotension   Type 2 diabetes mellitus with hyperglycemia (HCC)   Beta-hemolytic group A streptococcal sepsis (HCC)    [MAR Hold] insulin aspart  0-15 Units Subcutaneous TID WC   [MAR Hold] insulin glargine-yfgn  5 Units Subcutaneous Daily   [MAR Hold] sodium chloride flush  3 mL Intravenous Q12H    SUBJECTIVE: No new concerns / pain. Lethargic.    Review of Systems: Review of Systems  Unable to perform ROS: Medical condition    No Known Allergies  OBJECTIVE: Vitals:   04/04/22 0413 04/04/22 0803 04/04/22 1100 04/04/22 1217  BP: (!) 106/51 (!) 103/47 (!) 113/50 (!) 105/51  Pulse: 100 (!) 104 (!) 107 (!) 110  Resp: 20 20 20 18   Temp: 98.3 F (36.8 C) 97.8 F (36.6 C) 98.7 F (37.1 C) 98 F (36.7 C)  TempSrc: Oral Oral Oral Oral  SpO2: 97% 97% 94% 94%  Weight: 76.1 kg     Height:       Body mass index is 27.92 kg/m.   Physical Exam Vitals reviewed.  Constitutional:      Appearance: She is  ill-appearing.     Comments: Lethargic, eyes closed and short word answers   Cardiovascular:     Rate and Rhythm: Normal rate.  Pulmonary:     Effort: Pulmonary effort is normal.  Abdominal:     General: There is no distension.     Palpations: Abdomen is soft.  Musculoskeletal:     Comments: Left knee blister looks unchanged from yesterday's assessment     Skin:    General: Skin is warm and dry.     Capillary Refill: Capillary refill takes less than 2 seconds.  Neurological:     Mental Status: She is disoriented.       Lab Results Lab Results  Component Value Date   WBC 16.9 (H) 04/04/2022   WBC 16.8 (H) 04/04/2022   HGB 8.2 (L) 04/04/2022   HGB 8.4 (L) 04/04/2022   HCT 23.3 (L) 04/04/2022   HCT 23.8 (L) 04/04/2022   MCV 74.2 (L) 04/04/2022   MCV 74.8 (L) 04/04/2022   PLT 135 (L) 04/04/2022   PLT 127 (L) 04/04/2022    Lab Results  Component Value Date   CREATININE 1.28 (H) 04/04/2022   BUN 27 (H) 04/04/2022   NA 125 (L) 04/04/2022   K 3.5 04/04/2022   CL 94 (L) 04/04/2022   CO2 19 (L) 04/04/2022  Lab Results  Component Value Date   ALT 22 04/03/2022   AST 26 04/03/2022   ALKPHOS 41 04/03/2022   BILITOT 0.6 04/03/2022     Microbiology: Recent Results (from the past 240 hour(s))  Culture, blood (Routine x 2)     Status: Abnormal   Collection Time: 04/02/22  7:30 AM   Specimen: BLOOD LEFT HAND  Result Value Ref Range Status   Specimen Description BLOOD LEFT HAND  Final   Special Requests   Final    BOTTLES DRAWN AEROBIC AND ANAEROBIC Blood Culture results may not be optimal due to an inadequate volume of blood received in culture bottles   Culture  Setup Time   Final    GRAM POSITIVE COCCI IN BOTH AEROBIC AND ANAEROBIC BOTTLES CRITICAL RESULT CALLED TO, READ BACK BY AND VERIFIED WITH: PHARMD JAMES LEDFORD 04/02/22@22 :35 BY TW    Culture (A)  Final    STREPTOCOCCUS PYOGENES HEALTH DEPARTMENT NOTIFIED Performed at [facility], [address] [place], [place] [address]    Report Status 04/04/2022 FINAL  Final   Organism ID, Bacteria STREPTOCOCCUS PYOGENES  Final      Susceptibility   Streptococcus pyogenes - MIC*    PENICILLIN <=0.06 SENSITIVE Sensitive     CEFTRIAXONE <=0.12 SENSITIVE Sensitive     ERYTHROMYCIN <=0.12 SENSITIVE Sensitive     LEVOFLOXACIN <=0.25 SENSITIVE Sensitive     VANCOMYCIN <=0.12 SENSITIVE Sensitive     * STREPTOCOCCUS PYOGENES  Blood Culture ID Panel (Reflexed)     Status: Abnormal   Collection Time: 04/02/22  7:30 AM  Result Value Ref Range Status   Enterococcus faecalis NOT DETECTED NOT DETECTED Final   Enterococcus Faecium NOT DETECTED NOT DETECTED Final   Listeria monocytogenes NOT DETECTED NOT DETECTED Final   Staphylococcus species NOT DETECTED NOT DETECTED Final   Staphylococcus aureus (BCID) NOT DETECTED NOT DETECTED Final   Staphylococcus epidermidis NOT DETECTED NOT DETECTED Final   Staphylococcus lugdunensis NOT DETECTED NOT DETECTED Final   Streptococcus species DETECTED (A) NOT DETECTED Final    Comment: CRITICAL RESULT CALLED TO, READ BACK BY AND VERIFIED WITH: PHARMD JAMES LEDFORD 04/02/22@22 :35 BY TW    Streptococcus agalactiae NOT DETECTED NOT DETECTED Final   Streptococcus pneumoniae NOT DETECTED NOT DETECTED Final   Streptococcus pyogenes DETECTED (A) NOT DETECTED Final    Comment: CRITICAL RESULT CALLED TO, READ BACK BY AND VERIFIED WITH: PHARMD JAMES LEDFORD 04/02/22@22 :35 BY TW    A.calcoaceticus-baumannii NOT DETECTED NOT DETECTED Final   Bacteroides fragilis NOT DETECTED NOT DETECTED Final   Enterobacterales NOT DETECTED NOT DETECTED Final   Enterobacter cloacae complex NOT DETECTED NOT DETECTED Final   Escherichia coli NOT DETECTED NOT DETECTED Final   Klebsiella aerogenes NOT DETECTED NOT DETECTED Final   Klebsiella oxytoca NOT DETECTED NOT DETECTED Final   Klebsiella pneumoniae NOT DETECTED NOT DETECTED Final   Proteus species NOT DETECTED NOT DETECTED Final    Salmonella species NOT DETECTED NOT DETECTED Final   Serratia marcescens NOT DETECTED NOT DETECTED Final   Haemophilus influenzae NOT DETECTED NOT DETECTED Final   Neisseria meningitidis NOT DETECTED NOT DETECTED Final   Pseudomonas aeruginosa NOT DETECTED NOT DETECTED Final   Stenotrophomonas maltophilia NOT DETECTED NOT DETECTED Final   Candida albicans NOT DETECTED NOT DETECTED Final   Candida auris NOT DETECTED NOT DETECTED Final   Candida glabrata NOT DETECTED NOT DETECTED Final   Candida krusei NOT DETECTED NOT DETECTED Final   Candida parapsilosis NOT DETECTED NOT  DETECTED Final   Candida tropicalis NOT DETECTED NOT DETECTED Final   Cryptococcus neoformans/gattii NOT DETECTED NOT DETECTED Final    Comment: Performed at [facility], [address] [place], [place] [address]  Culture, blood (Routine x 2)     Status: None (Preliminary result)   Collection Time: 04/02/22  9:37 AM   Specimen: BLOOD RIGHT HAND  Result Value Ref Range Status   Specimen Description BLOOD RIGHT HAND  Final   Special Requests   Final    BOTTLES DRAWN AEROBIC ONLY Blood Culture results may not be optimal due to an inadequate volume of blood received in culture bottles   Culture   Final    NO GROWTH 2 DAYS Performed at [facility], [address], [place], [place] [address]    Report Status PENDING  Incomplete  Surgical pcr screen     Status: Abnormal   Collection Time: 04/02/22 11:44 AM   Specimen: Nasal Mucosa; Nasal Swab  Result Value Ref Range Status   MRSA, PCR NEGATIVE NEGATIVE Final   Staphylococcus aureus POSITIVE (A) NEGATIVE Final    Comment: (NOTE) The Xpert SA Assay (FDA approved for NASAL specimens in patients 82 years of age and older), is one component of a comprehensive surveillance program. It is not intended to diagnose infection nor to guide or monitor treatment. Performed at [facility], [address] [place], [place] [address]   Aerobic/Anaerobic Culture  w Gram Stain (surgical/deep wound)     Status: None (Preliminary result)   Collection Time: 04/02/22  1:23 PM   Specimen: PATH Other; Body Fluid  Result Value Ref Range Status   Specimen Description WOUND  Final   Special Requests LEFT LEG BULLA FLUID TEST ANCEF  Final   Gram Stain   Final    NO WBC SEEN RARE GRAM POSITIVE COCCI IN PAIRS Performed at [facility], [address], [place], [place] [address]    Culture   Final    FEW GROUP A STREP (S.PYOGENES) ISOLATED Beta hemolytic streptococci are predictably susceptible to penicillin and other beta lactams. Susceptibility testing not routinely performed. NO ANAEROBES ISOLATED; CULTURE IN PROGRESS FOR 5 DAYS    Report Status PENDING  Incomplete  Aerobic/Anaerobic Culture w Gram Stain (surgical/deep wound)     Status: None (Preliminary result)   Collection Time: 04/02/22  1:30 PM   Specimen: PATH Other; Tissue  Result Value Ref Range Status   Specimen Description WOUND  Final   Special Requests LEFT LEG PROXIMAL MEDIAL DEEP TEST ANCEF  Final   Gram Stain   Final    FEW WBC PRESENT,BOTH PMN AND MONONUCLEAR ABUNDANT GRAM POSITIVE COCCI Performed at [facility], [address], [place], [place] [address]    Culture   Final    ABUNDANT GROUP A STREP (S.PYOGENES) ISOLATED Beta hemolytic streptococci are predictably susceptible to penicillin and other beta lactams. Susceptibility testing not routinely performed. NO ANAEROBES ISOLATED; CULTURE IN PROGRESS FOR 5 DAYS    Report Status PENDING  Incomplete  Aerobic/Anaerobic Culture w Gram Stain (surgical/deep wound)     Status: None (Preliminary result)   Collection Time: 04/02/22  1:32 PM   Specimen: PATH Other; Tissue  Result Value Ref Range Status   Specimen Description WOUND  Final   Special Requests LEFT LEG DISTAL MEDIAL DEEP TEST ANCEF  Final   Gram Stain   Final    NO WBC SEEN ABUNDANT GRAM POSITIVE COCCI IN  PAIRS IN CHAINS Performed at [facility],  [address], [place], [place] [address]    Culture   Final    ABUNDANT GROUP A STREP (S.PYOGENES) ISOLATED Beta hemolytic streptococci are predictably susceptible to penicillin and other beta lactams. Susceptibility testing not routinely performed. NO ANAEROBES ISOLATED; CULTURE IN PROGRESS FOR 5 DAYS    Report Status PENDING  Incomplete     Rexene Alberts, MSN, NP-C [facility]  [email or web address] Pager: [phone] Office: [phone] [facility] Main Line: [phone] *Secure Chat Communication Welcome

## 2022-04-04 NOTE — Anesthesia Procedure Notes (Signed)
Procedure Name: Intubation Date/Time: 04/04/2022 2:04 PM  Performed by: Gwyndolyn Saxon, CRNAPre-anesthesia Checklist: Patient identified, Emergency Drugs available, Suction available and Patient being monitored Patient Re-evaluated:Patient Re-evaluated prior to induction Oxygen Delivery Method: Circle system utilized Preoxygenation: Pre-oxygenation with 100% oxygen Induction Type: IV induction Ventilation: Mask ventilation without difficulty Laryngoscope Size: Mac and 3 Grade View: Grade III Tube type: Oral Tube size: 7.0 mm Number of attempts: 1 Airway Equipment and Method: Patient positioned with wedge pillow and Stylet Placement Confirmation: positive ETCO2 and breath sounds checked- equal and bilateral Secured at: 21 cm Tube secured with: Tape Dental Injury: Teeth and Oropharynx as per pre-operative assessment

## 2022-04-04 NOTE — Op Note (Signed)
04/04/2022  2:49 PM  PATIENT:  Jill Munoz    PRE-OPERATIVE DIAGNOSIS:  Necrotizing Fascitis Left Ankle  POST-OPERATIVE DIAGNOSIS:  Same  PROCEDURE:  LEFT ABOVE KNEE AMPUTATION  SURGEON:  [name], MD  PHYSICIAN ASSISTANT:None ANESTHESIA:   General  PREOPERATIVE INDICATIONS:  Jill Munoz is a  71 y.o. female with a diagnosis of Necrotizing Fascitis Left Ankle who failed conservative measures and elected for surgical management.    The risks benefits and alternatives were discussed with the patient preoperatively including but not limited to the risks of infection, bleeding, nerve injury, cardiopulmonary complications, the need for revision surgery, among others, and the patient was willing to proceed.  OPERATIVE IMPLANTS: Wound packed open with Kerlix.  @ENCIMAGES @  OPERATIVE FINDINGS: Necrotizing fasciitis extended proximal to the above-knee amputation site necessitating packing the wound open.  Tissue cultures were sent from the leg and thigh as separate cultures.  OPERATIVE PROCEDURE: Patient was brought the operating room and underwent a general anesthetic after an arterial line was placed as well.  After adequate levels anesthesia obtained patient's left lower extremity was prepped using DuraPrep draped into a sterile field a timeout was called visualization of her leg patient had necrotizing cellulitis with blistering and drainage that extended proximal to the knee.  The muscles in the calf were not contractile and were necrotic.  The skin and soft tissue in the leg was also necrotic.  The decision was made to proceed with an above-the-knee amputation a fishmouth incision was made proximal to the patella this was carried down to the intermuscular septum medially the vascular bundles were clamped and suture-ligated with 2-0 silk.  A fishmouth incision was made and the femur was transected with a reciprocating saw.  When transecting the sciatic nerve with electrocautery  there was no contraction of any muscles in the leg.  The leg was completely nonviable.  After amputation electrocardio is used for further hemostasis.  With debridement the necrotic fascia infection extended medially proximal to the incision.  This was further debrided and irrigated with normal saline this was packed open with Kerlix and the entire wound was packed open with Kerlix anticipating returning to the operating room on Friday.  After sterile dressing was applied patient was extubated taken the PACU in stable condition.  Orders were placed to transfuse 2 units of packed red blood cells.   DISCHARGE PLANNING:  Antibiotic duration: Continue IV antibiotics  Weightbearing: Nonweightbearing on the left  Pain medication: Opioid pathway  Dressing care/ Wound VAC: Reinforce dressing as needed  Ambulatory devices: Transfer training  Return to the operating room on Friday for further debridement  Follow-up: In the office 1 week post operative.

## 2022-04-04 NOTE — Progress Notes (Signed)
Inpatient Diabetes Program Recommendations  AACE/ADA: New Consensus Statement on Inpatient Glycemic Control (2015)  Target Ranges:  Prepandial:   less than 140 mg/dL      Peak postprandial:   less than 180 mg/dL (1-2 hours)      Critically ill patients:  140 - 180 mg/dL   Lab Results  Component Value Date   GLUCAP 130 (H) 04/04/2022   HGBA1C 11.0 (H) 04/02/2022    Review of Glycemic Control  Diabetes history: New onset DM Outpatient Diabetes medications: None Current orders for Inpatient glycemic control: Semglee 5 units QD, Novolog 0-15 TID with meals and 0-5 HS  HgbA1C - 11% AKA today.  Inpatient Diabetes Program Recommendations:    Spoke with pt briefly at bedside on 6/20. Pt seemed very confused and asked me if I could call husband and speak with him.   Spoke with Pt's husband this afternoon regarding new diagnosis DM2 and HgbA1C. Explained that pt will likely need to go home on insulin and discussed importance of good glycemic control for healing. Husband states they have used homeopathic or alternative medicine for glucose control at home. Understands need for insulin now. Educated on insulin pen administration. Demonstrated insulin pen use and reviewed contents of starter kit. Pt's husband requests that we review monitoring and insulin pen use right before pt is discharging. Needs more education about need to control blood sugars to reduce risks of complications.    Thank you. Lorenda Peck, RD, LDN, CDE Inpatient Diabetes Coordinator [phone]

## 2022-04-04 NOTE — Anesthesia Postprocedure Evaluation (Signed)
Anesthesia Post Note  Patient: Maki Hancox  Procedure(s) Performed: LEFT ABOVE KNEE AMPUTATION (Left)     Patient location during evaluation: PACU Anesthesia Type: General Level of consciousness: awake and alert Pain management: pain level controlled Vital Signs Assessment: post-procedure vital signs reviewed and stable Respiratory status: spontaneous breathing, nonlabored ventilation, respiratory function stable and patient connected to nasal cannula oxygen Cardiovascular status: blood pressure returned to baseline and stable Postop Assessment: no apparent nausea or vomiting Anesthetic complications: no   No notable events documented.  Last Vitals:  Vitals:   04/04/22 1100 04/04/22 1217  BP: (!) 113/50 (!) 105/51  Pulse: (!) 107 (!) 110  Resp: 20 18  Temp: 37.1 C 36.7 C  SpO2: 94% 94%    Last Pain:  Vitals:   04/04/22 1223  TempSrc:   PainSc: 0-No pain                 Dreonna Hussein

## 2022-04-04 NOTE — Progress Notes (Addendum)
HD#2 Subjective:  Overnight Events: Na down to 122, received 250 cc bolus, repeat Na 125. Mildly tachycardic, hypotensive and tachypneic throughout the night. Bladder scan showed ~500 cc and I&O ~600 cc. Ordered foley to be inserted today.  Patient seen and and assessed at bedside.  Patient's somnolent but denies any acute pain.  Husband at bedside and informed of patient's status and plans for patient today.  Husband is appreciative of care.  Objective:  Vital signs in last 24 hours: Vitals:   04/03/22 1618 04/03/22 1638 04/03/22 2002 04/04/22 0413  BP: (!) 102/58  116/64 (!) 106/51  Pulse: 97  100 100  Resp: (!) 22  20 20   Temp: 97.9 F (36.6 C) 98.1 F (36.7 C) 98 F (36.7 C) 98.3 F (36.8 C)  TempSrc: Oral Oral Oral Oral  SpO2: 97%  96% 97%  Weight:    76.1 kg  Height:       Supplemental O2: Room Air SpO2: 97 % O2 Flow Rate (L/min): 2 L/min   Physical Exam:  Physical Exam Constitutional:      Appearance: She is ill-appearing and diaphoretic.  HENT:     Head: Normocephalic and atraumatic.  Cardiovascular:     Rate and Rhythm: Regular rhythm. Tachycardia present.     Pulses: Normal pulses.     Heart sounds: Normal heart sounds.  Pulmonary:     Effort: Pulmonary effort is normal.     Comments: Rales in lung bases Abdominal:     General: Bowel sounds are normal.     Palpations: Abdomen is soft.  Skin:    General: Skin is warm.     Comments: Left leg in wound vac. Less serosanguinous drainage today. 2+ right leg edema  Neurological:     Mental Status: She is alert.     Filed Weights   04/02/22 1244 04/04/22 0413  Weight: 77.1 kg 76.1 kg     Intake/Output Summary (Last 24 hours) at 04/04/2022 [id number] Last data filed at 04/03/2022 2000 Gross per 24 hour  Intake --  Output 1310 ml  Net -1310 ml    Net IO Since Admission: 1,968.32 mL [04/04/22 0733]  Pertinent Labs:    Latest Ref Rng & Units 04/04/2022    5:41 AM 04/03/2022    5:03 AM 04/02/2022    12:16 PM  CBC  WBC 4.0 - 10.5 K/uL 16.9  6.7    Hemoglobin 12.0 - 15.0 g/dL 8.2  8.8  10.2   Hematocrit 36.0 - 46.0 % 23.3  24.6  30.0   Platelets 150 - 400 K/uL 135  180         Latest Ref Rng & Units 04/04/2022    5:41 AM 04/03/2022   11:47 PM 04/03/2022    5:31 PM  CMP  Glucose 70 - 99 mg/dL 113  157  165   BUN 8 - 23 mg/dL 27  28  27    Creatinine 0.44 - 1.00 mg/dL 1.28  1.31  1.24   Sodium 135 - 145 mmol/L 125  122  123   Potassium 3.5 - 5.1 mmol/L 3.5  3.6  3.7   Chloride 98 - 111 mmol/L 94  92  93   CO2 22 - 32 mmol/L 19  17  16    Calcium 8.9 - 10.3 mg/dL 7.7  7.4  7.5     Imaging: ECHOCARDIOGRAM COMPLETE  Result Date: 04/03/2022    ECHOCARDIOGRAM REPORT   Patient Name:   Jill Munoz Date of  Exam: 04/03/2022 Medical Rec #:  [id number]       Height:       65.0 in Accession #:    [id number]      Weight:       170.0 lb Date of Birth:  1951/02/05        BSA:          1.846 m Patient Age:    71 years        BP:           127/68 mmHg Patient Gender: F               HR:           97 bpm. Exam Location:  Inpatient Procedure: 2D Echo, Color Doppler and Cardiac Doppler Indications:    Bacteremia  History:        Patient has no prior history of Echocardiogram examinations.  Sonographer:    Irving Burton Senior RDCS Referring Phys: [id number] JULIE MACHEN IMPRESSIONS  1. The mitral valve is abnormally thickened. Though morphology favors calcification, cannot exclude small vegetation (study indication is bacteremia). TEE may be considered. No evidence of mitral valve regurgitation.  2. Left ventricular ejection fraction, by estimation, is 40 to 45%. The left ventricle has mildly decreased function. The left ventricle demonstrates global hypokinesis. There is severe asymmetric left ventricular hypertrophy of the basal-septal segment. Left ventricular diastolic parameters are consistent with Grade I diastolic dysfunction (impaired relaxation).  3. Right ventricular systolic function is normal. The right  ventricular size is normal. Tricuspid regurgitation signal is inadequate for assessing PA pressure.  4. The aortic valve is tricuspid. Aortic valve regurgitation is not visualized. Comparison(s): No prior Echocardiogram. FINDINGS  Left Ventricle: Left ventricular ejection fraction, by estimation, is 40 to 45%. The left ventricle has mildly decreased function. The left ventricle demonstrates global hypokinesis. The left ventricular internal cavity size was small. There is severe asymmetric left ventricular hypertrophy of the basal-septal segment. Left ventricular diastolic parameters are consistent with Grade I diastolic dysfunction (impaired relaxation). Right Ventricle: The right ventricular size is normal. No increase in right ventricular wall thickness. Right ventricular systolic function is normal. Tricuspid regurgitation signal is inadequate for assessing PA pressure. Left Atrium: Left atrial size was normal in size. Right Atrium: Right atrial size was normal in size. Pericardium: There is no evidence of pericardial effusion. Mitral Valve: The mitral valve is abnormal. No evidence of mitral valve regurgitation. Tricuspid Valve: The tricuspid valve is grossly normal. Tricuspid valve regurgitation is not demonstrated. No evidence of tricuspid stenosis. Aortic Valve: The aortic valve is tricuspid. Aortic valve regurgitation is not visualized. Pulmonic Valve: The pulmonic valve was normal in structure. Pulmonic valve regurgitation is not visualized. No evidence of pulmonic stenosis. Aorta: The aortic root and ascending aorta are structurally normal, with no evidence of dilitation. IAS/Shunts: No atrial level shunt detected by color flow Doppler.  LEFT VENTRICLE PLAX 2D LVIDd:         3.30 cm     Diastology LVIDs:         2.60 cm     LV e' medial:    5.55 cm/s LV PW:         1.10 cm     LV E/e' medial:  12.0 LV IVS:        1.00 cm     LV e' lateral:   4.79 cm/s LVOT diam:     2.10 cm     LV E/e' lateral: 13.9 LV  SV:          42 LV SV Index:   23 LVOT Area:     3.46 cm  LV Volumes (MOD) LV vol d, MOD A2C: 68.2 ml LV vol d, MOD A4C: 58.4 ml LV vol s, MOD A2C: 35.4 ml LV vol s, MOD A4C: 31.3 ml LV SV MOD A2C:     32.8 ml LV SV MOD A4C:     58.4 ml LV SV MOD BP:      30.3 ml RIGHT VENTRICLE RV S prime:     10.70 cm/s TAPSE (M-mode): 2.1 cm LEFT ATRIUM           Index        RIGHT ATRIUM          Index LA diam:      3.30 cm 1.79 cm/m   RA Area:     8.63 cm LA Vol (A2C): 50.4 ml 27.30 ml/m  RA Volume:   15.30 ml 8.29 ml/m LA Vol (A4C): 46.3 ml 25.08 ml/m  AORTIC VALVE LVOT Vmax:   65.90 cm/s LVOT Vmean:  48.900 cm/s LVOT VTI:    0.121 m  AORTA Ao Root diam: 3.00 cm Ao Asc diam:  3.20 cm MITRAL VALVE MV Area (PHT): 3.33 cm    SHUNTS MV Decel Time: 228 msec    Systemic VTI:  0.12 m MV E velocity: 66.40 cm/s  Systemic Diam: 2.10 cm MV A velocity: 77.10 cm/s MV E/A ratio:  0.86 Riley Lam MD Electronically signed by Riley Lam MD Signature Date/Time: 04/03/2022/12:22:48 PM    Final     Assessment/Plan:   Principal Problem:   Necrotizing fasciitis of lower leg (HCC) Active Problems:   Hyponatremia   Hypotension   Bacteremia due to Streptococcus Pyogenes   Type 2 diabetes mellitus with hyperglycemia (HCC)   Beta-hemolytic group A streptococcal sepsis (HCC)   Patient Summary: Jill Munoz is a 71 y.o. with no known PMHx, who presented with worsening left hemorrhagic bulla of left leg  and admitted for necrotizing fasciitis of left leg, S. Pyogenes bacteremia, hypotonic hypovolemic hyponatremia and uncontrolled T2DM.   # Necrotizing Fasciitis of left leg Will require multiple debridements vs amputation. - Telemetry monitoring - Orthopedic surgery following; appreciate their recommendations and assistance - Debridement vs amputation today - Continue penicillin and linezolid (day 3 of abx) - Tylenol PRN for fever, mild pain  - Norco PRN for severe pain - Blood and surgical cultures  pending - RD consult for dietary needs  #S. Pyogenes bacteremia BCID shows S. Pyogenes. Likely source of infection from left leg wound. Tachycardia and tachypnea likely secondary to bacteremia. Leucocytosis likely delayed reactive.  -Continue abx as above -Repeat Bcx today -ID following, greatly appreciate recommendations and assistance -Delirium precautions given she is at high risk for hospital delirium   #Hypotonic Euvolemic Hyponatremia  Multifactorial likely due to necrotizing fasciitis, bacteremia, poor PO intake. Most recent sodium 125. There is a possibility she has chronic euvolemic hyponatremia that had become acutely hypovolemic so will repeat urine studies to assess - BMP q8h - Will provide gentle hydration with boluses as needed to slowly titrate serum sodium to normal   # Hypotension Likely 2/2 to poor PO intake and bacteremia/nec fasc. BP has been responsive to IVF resuscitation. -Continue to monitor BP post-op -will provide gentle hydration as needed   # Acute Kidney Injury # Neurogenic Bladder Cr 1.28. Likely has bladder stretch injury given 1.2 L output after bladder scan >999  cc yesterday. Continues to retain per bladder scans so will insert foley today. - Continue monitoring renal function daily - Continue IVF resuscitation - Foley today   # Anion Gap Metabolic Acidosis  Stable bicarb 19.  - Monitor bicarb daily - No indication for bicarb infusion   # Type 2 Diabetes Mellitus  A1c 11%.   -Start Semglee 5 u - SSI, moderate and CBG qac and qhs - Consult to diabetes coordinator for newly diagnosed T2DM   # Iron Deficiency Anemia # Microcytic Anemia  No known prior history. Iron saturation ratio 3%. - Monitor CBC daily - Transfuse for hemoglobin < 7 - Reticulocyte ordered  #Thrombocytopenia Platelet 135. Possibly dilutional. 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 -Will need GDMT but will start these after she is more  stable from other acute problems -Outpatient cardiology follow up   # Left Subdural Hygroma  Incidental finding on CT head. Given bacteremia, needs monitoring if it turns into an abscess   # Diabetes induced neuropathy # Chronic Hand Numbness Given A1c elevation, I suspect this may be secondary to peripheral neuropathy in the setting of uncontrolled diabetes. B12 exorbitantly high at 4,868 and TSH 0.861. - Outpatient follow up recommended  - TOC to establish PCP needs  Diet: NPO for procedure, but will resume Carb-Modified after IVF: none VTE: None today Code: Full PT/OT recs: Pending, none. TOC recs: pending   Dispo: Anticipated discharge to Skilled nursing facility in 5 days pending debridement of LLE, management of bacteremia, good insulin regiment, resolution of hyponatremia.   France Ravens, MD 04/04/2022, 7:33 AM Pager: [phone]  Please contact the on call pager after 5 pm and on weekends at [phone].

## 2022-04-04 NOTE — Progress Notes (Addendum)
Initial Nutrition Assessment  DOCUMENTATION CODES:   Not applicable  INTERVENTION:   When diet advanced after procedure start: Ensure Enlive po TID, each supplement provides 350 kcal and 20 grams of protein. MVI with minerals daily. Vitamin C 500 mg BID. Zinc sulfate 220 mg daily x 14 days.  Check vitamin A, vitamin C, zinc, Vitamin B-6, folate, thiamine levels.  NUTRITION DIAGNOSIS:   Increased nutrient needs related to wound healing as evidenced by estimated needs.  GOAL:   Patient will meet greater than or equal to 90% of their needs  MONITOR:   PO intake, Supplement acceptance, Labs, Skin  REASON FOR ASSESSMENT:   Consult Assessment of nutrition requirement/status  ASSESSMENT:   71 yo female admitted with necrotizing fasciitis of LLE and strep pyogenes bacteremia. PMH includes DM-2.  Patient out of her room for debridement of LLE today. Currently NPO. RD unable to speak with patient or complete NFPE at this time. Plans to return to the OR on Friday for repeat debridement.  May require amputation.  Will check vitamin/mineral panel to ensure no vitamin deficiencies to inhibit wound healing.   No recent weight history available for review.   Labs reviewed. Na 125 CBG: 109-143  Medications reviewed and include Novolog, Semglee. IVF: LR at 10 ml/h  NUTRITION - FOCUSED PHYSICAL EXAM:  Unable to complete  Diet Order:   Diet Order             Diet NPO time specified  Diet effective ____                   EDUCATION NEEDS:   Not appropriate for education at this time  Skin:  Skin Assessment: Skin Integrity Issues: Skin Integrity Issues:: Wound VAC Wound Vac: LLE necrotizing fasciitis  Last BM:  no BM documented  Height:   Ht Readings from Last 1 Encounters:  04/02/22 5\' 5"  (1.651 m)    Weight:   Wt Readings from Last 1 Encounters:  04/04/22 76.1 kg    BMI:  Body mass index is 27.92 kg/m.  Estimated Nutritional Needs:   Kcal:   2000-2300  Protein:  130-150 gm  Fluid:  >/= 2 L    04/06/22 RD, LDN, CNSC Please refer to Amion for contact information.

## 2022-04-04 NOTE — Anesthesia Procedure Notes (Signed)
Arterial Line Insertion Start/End6/21/2023 1:45 PM, 04/04/2022 1:55 PM  Patient location: Pre-op. Preanesthetic checklist: patient identified, IV checked, site marked, risks and benefits discussed, surgical consent, monitors and equipment checked, pre-op evaluation, timeout performed and anesthesia consent Lidocaine 1% used for infiltration Left, radial was placed Catheter size: 20 G Hand hygiene performed , maximum sterile barriers used  and Seldinger technique used Allen's test indicative of satisfactory collateral circulation Attempts: 1 Procedure performed without using ultrasound guided technique. Following insertion, dressing applied and Biopatch. Post procedure assessment: unchanged  Patient tolerated the procedure well with no immediate complications.

## 2022-04-04 NOTE — Transfer of Care (Signed)
Immediate Anesthesia Transfer of Care Note  Patient: Jill Munoz  Procedure(s) Performed: LEFT ABOVE KNEE AMPUTATION (Left)  Patient Location: PACU  Anesthesia Type:General  Level of Consciousness: drowsy  Airway & Oxygen Therapy: Patient Spontanous Breathing and Patient connected to face mask oxygen  Post-op Assessment: Report given to RN and Post -op Vital signs reviewed and stable  Post vital signs: Reviewed and stable  Last Vitals:  Vitals Value Taken Time  BP 107/51 04/04/22 1453  Temp    Pulse 95 04/04/22 1456  Resp 18 04/04/22 1456  SpO2 100 % 04/04/22 1456  Vitals shown include unvalidated device data.  Last Pain:  Vitals:   04/04/22 1223  TempSrc:   PainSc: 0-No pain         Complications: No notable events documented.

## 2022-04-04 NOTE — Interval H&P Note (Signed)
History and Physical Interval Note:  04/04/2022 6:38 AM  Jill Munoz  has presented today for surgery, with the diagnosis of Necrotizing Fascitis Left Ankle.  The various methods of treatment have been discussed with the patient and family. After consideration of risks, benefits and other options for treatment, the patient has consented to  Procedure(s): LEFT ANKLE DEBRIDEMENT VERSUS AMPUTATION (Left) as a surgical intervention.  The patient's history has been reviewed, patient examined, no change in status, stable for surgery.  I have reviewed the patient's chart and labs.  Questions were answered to the patient's satisfaction.     [name]

## 2022-04-04 NOTE — Progress Notes (Addendum)
OT Cancellation Note  Patient Details Name: Stepheny Canal MRN: [id number] DOB: [birth year]   Cancelled Treatment:    Reason Eval/Treat Not Completed: Patient at procedure or test/ unavailable.    Kaiden Pech D Charissa Knowles 04/04/2022, 2:18 PM 04/04/2022  RP, OTR/L  [facility]  Office:  [phone]

## 2022-04-04 NOTE — Anesthesia Preprocedure Evaluation (Addendum)
Anesthesia Evaluation  Patient identified by MRN, date of birth, ID band Patient confused    Reviewed: Allergy & Precautions, NPO status , Patient's Chart, lab work & pertinent test resultsPreop documentation limited or incomplete due to emergent nature of procedure.  Airway Mallampati: III  TM Distance: >3 FB Neck ROM: Full  Mouth opening: Limited Mouth Opening  Dental no notable dental hx. (+) Dental Advisory Given, Poor Dentition   Pulmonary neg pulmonary ROS,    Pulmonary exam normal breath sounds clear to auscultation       Cardiovascular negative cardio ROS Normal cardiovascular exam Rhythm:Regular Rate:Normal  ECHO 6/23  1. The mitral valve is abnormally thickened. Though morphology favors  calcification, cannot exclude small vegetation (study indication is  bacteremia). TEE may be considered. No evidence of mitral valve  regurgitation.  2. Left ventricular ejection fraction, by estimation, is 40 to 45%. The  left ventricle has mildly decreased function. The left ventricle  demonstrates global hypokinesis. There is severe asymmetric left  ventricular hypertrophy of the basal-septal  segment. Left ventricular diastolic parameters are consistent with Grade I  diastolic dysfunction (impaired relaxation).  3. Right ventricular systolic function is normal. The right ventricular  size is normal. Tricuspid regurgitation signal is inadequate for assessing  PA pressure.  4. The aortic valve is tricuspid. Aortic valve regurgitation is not  visualized.    Neuro/Psych negative neurological ROS  negative psych ROS   GI/Hepatic negative GI ROS, Neg liver ROS,   Endo/Other  negative endocrine ROSdiabetes, Type 2  Renal/GU negative Renal ROS  negative genitourinary   Musculoskeletal negative musculoskeletal ROS (+)   Abdominal   Peds negative pediatric ROS (+)  Hematology  (+) Blood dyscrasia, anemia ,   Anesthesia  Other Findings Necrotizing Fasciitis Hyponatremia Continue treating hypovolemia with LR @ 125 cc/hr  - Maximum Na goal of 126 by 6/20 @ 7 AM - If elevated between 127-129, will likely need to stop IVF - If markedly above goal (>130), will likely need Desmopressin and Nephrology consultation - Urine osm, sodium pending   Reproductive/Obstetrics negative OB ROS                            Anesthesia Physical  Anesthesia Plan  ASA: 3  Anesthesia Plan: General   Post-op Pain Management:    Induction: Intravenous  PONV Risk Score and Plan: 3 and Ondansetron, Dexamethasone, Midazolam and Treatment may vary due to age or medical condition  Airway Management Planned: LMA and Oral ETT  Additional Equipment: None  Intra-op Plan:   Post-operative Plan: Extubation in OR  Informed Consent: I have reviewed the patients History and Physical, chart, labs and discussed the procedure including the risks, benefits and alternatives for the proposed anesthesia with the patient or authorized representative who has indicated his/her understanding and acceptance.     Dental advisory given  Plan Discussed with: CRNA and Anesthesiologist  Anesthesia Plan Comments:        Anesthesia Quick Evaluation

## 2022-04-04 NOTE — Progress Notes (Signed)
Patient returned from the OR to 4E. Patient disoriented x4. Vitals taken and stable. Patient placed back on tele and CCMD notified. Left leg assessed and wrapped in coband. Call bell within reach and patient's family at the bedside. Jill Munoz

## 2022-04-05 ENCOUNTER — Encounter (HOSPITAL_COMMUNITY)
Admission: EM | Disposition: A | Discharge status: Skilled Nursing Facility | Payer: Self-pay | Source: Home / Self Care | Attending: Internal Medicine

## 2022-04-05 ENCOUNTER — Encounter (HOSPITAL_COMMUNITY): Payer: Self-pay | Admitting: Orthopedic Surgery

## 2022-04-05 DIAGNOSIS — E871 Hypo-osmolality and hyponatremia: Secondary | ICD-10-CM | POA: Diagnosis not present

## 2022-04-05 DIAGNOSIS — A400 Sepsis due to streptococcus, group A: Principal | ICD-10-CM

## 2022-04-05 DIAGNOSIS — B955 Unspecified streptococcus as the cause of diseases classified elsewhere: Secondary | ICD-10-CM | POA: Diagnosis not present

## 2022-04-05 DIAGNOSIS — M726 Necrotizing fasciitis: Secondary | ICD-10-CM | POA: Diagnosis not present

## 2022-04-05 LAB — CBC WITH DIFFERENTIAL/PLATELET
Abs Immature Granulocytes: 0.45 10*3/uL — ABNORMAL HIGH (ref 0.00–0.07)
Basophils Absolute: 0 10*3/uL (ref 0.0–0.1)
Basophils Relative: 0 %
Eosinophils Absolute: 0 10*3/uL (ref 0.0–0.5)
Eosinophils Relative: 0 %
HCT: 32 % — ABNORMAL LOW (ref 36.0–46.0)
Hemoglobin: 11.2 g/dL — ABNORMAL LOW (ref 12.0–15.0)
Immature Granulocytes: 2 %
Lymphocytes Relative: 2 %
Lymphs Abs: 0.5 10*3/uL — ABNORMAL LOW (ref 0.7–4.0)
MCH: 28.1 pg (ref 26.0–34.0)
MCHC: 35 g/dL (ref 30.0–36.0)
MCV: 80.2 fL (ref 80.0–100.0)
Monocytes Absolute: 0.3 10*3/uL (ref 0.1–1.0)
Monocytes Relative: 1 %
Neutro Abs: 21.4 10*3/uL — ABNORMAL HIGH (ref 1.7–7.7)
Neutrophils Relative %: 95 %
Platelets: 89 10*3/uL — ABNORMAL LOW (ref 150–400)
RBC: 3.99 MIL/uL (ref 3.87–5.11)
RDW: 16.1 % — ABNORMAL HIGH (ref 11.5–15.5)
Smear Review: DECREASED
WBC Morphology: INCREASED
WBC: 22.7 10*3/uL — ABNORMAL HIGH (ref 4.0–10.5)
nRBC: 0.2 % (ref 0.0–0.2)

## 2022-04-05 LAB — TYPE AND SCREEN
ABO/RH(D): O POS
Antibody Screen: NEGATIVE
Unit division: 0
Unit division: 0

## 2022-04-05 LAB — BPAM RBC
Blood Product Expiration Date: 202307182359
Blood Product Expiration Date: 202307192359
ISSUE DATE / TIME: 202306211639
ISSUE DATE / TIME: 202306212033
Unit Type and Rh: 5100
Unit Type and Rh: 5100

## 2022-04-05 LAB — BASIC METABOLIC PANEL
Anion gap: 12 (ref 5–15)
BUN: 25 mg/dL — ABNORMAL HIGH (ref 8–23)
CO2: 18 mmol/L — ABNORMAL LOW (ref 22–32)
Calcium: 7.5 mg/dL — ABNORMAL LOW (ref 8.9–10.3)
Chloride: 96 mmol/L — ABNORMAL LOW (ref 98–111)
Creatinine, Ser: 1.14 mg/dL — ABNORMAL HIGH (ref 0.44–1.00)
GFR, Estimated: 52 mL/min — ABNORMAL LOW (ref >60)
Glucose, Bld: 192 mg/dL — ABNORMAL HIGH (ref 70–99)
Potassium: 3.7 mmol/L (ref 3.5–5.1)
Sodium: 126 mmol/L — ABNORMAL LOW (ref 135–145)

## 2022-04-05 LAB — GLUCOSE, CAPILLARY
Glucose-Capillary: 226 mg/dL — ABNORMAL HIGH (ref 70–99)
Glucose-Capillary: 228 mg/dL — ABNORMAL HIGH (ref 70–99)
Glucose-Capillary: 237 mg/dL — ABNORMAL HIGH (ref 70–99)
Glucose-Capillary: 184 mg/dL — ABNORMAL HIGH (ref 70–99)

## 2022-04-05 LAB — FOLATE: Folate: 13.3 ng/mL (ref >5.9)

## 2022-04-05 SURGERY — ECHOCARDIOGRAM, TRANSESOPHAGEAL
Anesthesia: Monitor Anesthesia Care

## 2022-04-05 MED ORDER — PROCHLORPERAZINE EDISYLATE 10 MG/2ML IJ SOLN
10.0000 mg | Freq: Four times a day (QID) | INTRAMUSCULAR | Status: DC | PRN
  Administered 2022-04-05 – 2022-04-09 (×7): 10 mg via INTRAVENOUS
  Filled 2022-04-05 (×7): qty 2

## 2022-04-05 MED ORDER — HYDROMORPHONE HCL 1 MG/ML IJ SOLN: 0.5000 mg | Freq: Four times a day (QID) | INTRAMUSCULAR | Status: DC | PRN

## 2022-04-05 MED ORDER — BRIMONIDINE TARTRATE 0.2 % OP SOLN
1.0000 [drp] | Freq: Three times a day (TID) | OPHTHALMIC | Status: DC
Start: 2022-04-05 — End: 2022-04-13
  Administered 2022-04-05 – 2022-04-13 (×20): 1 [drp] via OPHTHALMIC
  Filled 2022-04-05: qty 5

## 2022-04-05 MED ORDER — OXYCODONE HCL 5 MG PO TABS: 5.0000 mg | ORAL_TABLET | ORAL | Status: DC | PRN

## 2022-04-05 MED ORDER — PANTOPRAZOLE SODIUM 40 MG IV SOLR
40.0000 mg | Freq: Every day | INTRAVENOUS | Status: DC
  Administered 2022-04-05 – 2022-04-07 (×3): 40 mg via INTRAVENOUS
  Filled 2022-04-05 (×3): qty 10

## 2022-04-05 MED ORDER — ACETAMINOPHEN 325 MG PO TABS
650.0000 mg | ORAL_TABLET | Freq: Four times a day (QID) | ORAL | Status: DC | PRN
Start: 2022-04-05 — End: 2022-04-06

## 2022-04-05 MED ORDER — HYDROMORPHONE HCL 1 MG/ML IJ SOLN
0.5000 mg | INTRAMUSCULAR | Status: DC | PRN
Start: 2022-04-05 — End: 2022-04-05

## 2022-04-05 NOTE — Evaluation (Signed)
Occupational Therapy Evaluation Patient Details Name: Jill Munoz MRN: [id number] DOB: 18-Dec-1950 Today's Date: 04/05/2022   History of Present Illness Pt is a 71 y.o. female admitted 04/02/22 with L ankle cellulitis, necrotizing fasciitis. S/p LLE I&D with wound vac placement 6/19. Course complicated by severe sepsis, AKI, acute metabolic encephalopathy. 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 Plan for return to OR 6/23 for further debridement. PMH includes pre-diabetes.   Clinical Impression   Pt currently needing total +2 assist with bed mobility, supine to sit, and simulated selfcare tasks from supine to sitting.  Max assist overall for sitting balance with decreased sustained attention to tasks.  She was oriented to place, month, and reason for hospitalization, but needed overall mod demonstrational cueing with decreased thoroughness to complete simple grooming task of washing her face EOB with support.  Tolerated sitting up for greater than 10 mins on the EOB before transitioning back to supine secondary to fatigue and difficulty keeping her eyes open.  Feel she will benefit from acute care OT to help increased independence with basic selfcare tasks and increased attention. She will likely need follow-up SNF for extended rehab post acute in order to return home with her spouse.       Recommendations for follow up therapy are one component of a multi-disciplinary discharge planning process, led by the attending physician.  Recommendations may be updated based on patient status, additional functional criteria and insurance authorization.   Follow Up Recommendations  Skilled nursing-short term rehab (<3 hours/day)    Assistance Recommended at Discharge Frequent or constant Supervision/Assistance  Patient can return home with the following Two people to help with walking and/or transfers;A lot of help with bathing/dressing/bathroom;Assist for  transportation;Help with stairs or ramp for entrance;Direct supervision/assist for medications management;Assistance with cooking/housework    Functional Status Assessment  Patient has had a recent decline in their functional status and demonstrates the ability to make significant improvements in function in a reasonable and predictable amount of time.  Equipment Recommendations  Other (comment) (TBD next venue of care)    Recommendations for Other Services       Precautions / Restrictions Precautions Precautions: Fall;Other (comment) Precaution Comments: a-line, watch SpO2 (does not wear O2 baseline) Restrictions Weight Bearing Restrictions: Yes LLE Weight Bearing: Non weight bearing      Mobility Bed Mobility Overal bed mobility: Needs Assistance Bed Mobility: Supine to Sit, Sit to Supine     Supine to sit: Total assist, +2 for physical assistance Sit to supine: +2 for physical assistance, Total assist   General bed mobility comments: Pt needed assist with all aspects of supine to sit.    Transfers Overall transfer level: Needs assistance                 General transfer comment: totalA (+2) for lateral scoot towards HOB with bed pad; OOB via lift deferred secondary to fatigue post-sitting EOB activity      Balance Overall balance assessment: Needs assistance Sitting-balance support: Feet supported, Bilateral upper extremity supported Sitting balance-Leahy Scale: Poor Sitting balance - Comments: Posterior LOB in sitting with overall max assist needed to maintain balance.                                   ADL either performed or assessed with clinical judgement   ADL Overall ADL's : Needs assistance/impaired  Grooming: Therapist, nutritional;Moderate assistance Grooming Details (indicate cue type and reason): supported sitting Upper Body Bathing: Moderate assistance;Sitting Upper Body Bathing Details (indicate cue type and reason): simulated from  supported sitting                           General ADL Comments: Pt in bed to start with total +2 assist transfer to the EOB in sitting.  Severe kyphotic posture noted with frequent posterior LOB requiring overall max assist initially for sitting.  She progressed to short periods of less thand 30 seconds maintaining static balance with min guard assist.  Decreased thoroughness noted with washing face on two attempts.  Therapist had to provide max assist for placing washcloth in her right hand for her to wash part of her face.  She would instead just rub her hands on her face without awareness of the washcloth being in front of her to grasp.  Tolerated sitting EOB for approximately 10 mins before returning to supine.     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  Will look at further in additional treatments.)   Lower Extremity Assessment Lower Extremity Assessment: Defer to PT evaluation   Cervical / Trunk Assessment Cervical / Trunk Assessment: Kyphotic   Communication Communication Communication: Prefers language other than English;Receptive difficulties;Expressive difficulties;HOH   Cognition Arousal/Alertness: Lethargic Behavior During Therapy: Restless Overall Cognitive Status: Impaired/Different from baseline Area of Impairment: Attention, Following commands, Awareness, Problem solving                  Orientation Level: Time (day of the week, oriented to month and day of the month)     Following Commands: Follows one step commands inconsistently Safety/Judgement: Decreased awareness of deficits, Decreased awareness of safety Awareness: Intellectual Problem Solving: Slow processing, Requires verbal cues, Requires tactile cues General Comments: Pt closing eyes at times while sitting EOB.  When asked to wash her face, she needed mod demonstrational cueing for initiation and completing     General Comments  pt's husband present and supportive; SpO2 94% on RA; resting HR 100, BP 114/71. noted drainage at L AKA incision site, RN notified       Shoulder Instructions      Home Living Family/patient expects to be discharged to:: Private residence Living Arrangements: Spouse/significant other Available Help at Discharge: Family Type of Home: House Home Access: Stairs to enter     Home Layout: Multi-level     Bathroom Shower/Tub: Chief Strategy Officer: Standard     Home Equipment: Agricultural consultant (2 wheels);Rollator (4 wheels);Shower seat   Additional Comments: Pt unreliable to give accurate information.      Prior Functioning/Environment Prior Level of Function : Needs assist  Cognitive Assist : Mobility (cognitive)     Physical Assist : Mobility (physical) Mobility (physical): Transfers;Gait   Mobility Comments: Pt reported using RW and rollator at home and in community  OT Problem List: Decreased strength;Decreased activity tolerance;Impaired balance (sitting and/or standing);Decreased cognition;Decreased safety awareness;Decreased knowledge of use of DME or AE      OT Treatment/Interventions: Self-care/ADL training;Therapeutic exercise;Patient/family education;Balance training;Therapeutic activities;DME and/or AE instruction;Cognitive remediation/compensation    OT Goals(Current goals can be found in the care plan section) Acute Rehab OT  Goals Patient Stated Goal: Pt did not state but spouse agreeable to OT goals of selfcare. OT Goal Formulation: With patient/family Time For Goal Achievement: 04/19/22 Potential to Achieve Goals: Good  OT Frequency: Min 2X/week    Co-evaluation PT/OT/SLP Co-Evaluation/Treatment: Yes Reason for Co-Treatment: For patient/therapist safety;To address functional/ADL transfers PT goals addressed during session: Mobility/safety with mobility;Balance OT goals addressed during session: ADL's and self-care      AM-PAC OT "6 Clicks" Daily Activity     Outcome Measure Help from another person eating meals?: A Little Help from another person taking care of personal grooming?: A Lot Help from another person toileting, which includes using toliet, bedpan, or urinal?: Total Help from another person bathing (including washing, rinsing, drying)?: Total Help from another person to put on and taking off regular upper body clothing?: A Lot Help from another person to put on and taking off regular lower body clothing?: Total 6 Click Score: 10   End of Session Nurse Communication: Mobility status;Need for lift equipment  Activity Tolerance: Patient tolerated treatment well Patient left: in bed;with call bell/phone within reach;with bed alarm set;with family/visitor present  OT Visit Diagnosis: Other abnormalities of gait and mobility (R26.89);Muscle weakness (generalized) (M62.81);Other symptoms and signs involving cognitive function;Unsteadiness on feet (R26.81)                Time: 6606-0045 OT Time Calculation (min): 28 min Charges:  OT General Charges $OT Visit: 1 Visit OT Evaluation $OT Eval Moderate Complexity: 1 Mod Loye Reininger OTR/L 04/05/2022, 11:24 AM

## 2022-04-05 NOTE — Progress Notes (Signed)
ID Brief Note:   Noted that patient has undergone AKA with plan for return to OR on Friday 6/23. Will continue current antibiotics pending re-eval in OR, including linezolid.    Rexene Alberts, MSN, NP-C [facility] for Infectious Disease [facility]  [email or web address] Pager: [phone] Office: [phone] [facility] Main Line: [phone] *Secure Chat Communication Welcome

## 2022-04-05 NOTE — Progress Notes (Signed)
IP rehab admissions - I met with patient and her husband.  Husband is interested in in house rehab on CIR.  Noted patient is max to total assist and PT/OT recommending SNF placement.  Noted patient will need to return to the OR tomorrow.  I will have my partner follow progress to see if patient might become inpatient rehab appropriate.  Call for questions.  [phone]

## 2022-04-05 NOTE — Progress Notes (Addendum)
HD#3 Subjective:  Overnight Events: Na stable ~125-126 likely due to getting some NS with PCN and AKA. BP low in the 98/51.  Patient seen and and assessed at bedside. PT and husband were in room and PT was assisting patient sit up. Discussed the aggressive surgery required to manage infection as well as bacteremia. Husband understand that patient to return to OR tomorrow.   Objective:  Vital signs in last 24 hours: Vitals:   04/04/22 2336 04/05/22 0200 04/05/22 0427 04/05/22 0451  BP: (!) 149/82 (!) 92/48 (!) 98/51   Pulse: 99 82 92   Resp:  15 17   Temp: (!) 97.5 F (36.4 C)  (!) 96 F (35.6 C) 97.6 F (36.4 C)  TempSrc: Axillary  Axillary Axillary  SpO2:  97% 96%   Weight:      Height:       Supplemental O2:  SpO2: 96 % O2 Flow Rate (L/min): 2 L/min   Physical Exam:  Physical Exam Constitutional:      Appearance: She is ill-appearing and diaphoretic.  HENT:     Head: Normocephalic and atraumatic.  Cardiovascular:     Rate and Rhythm: Regular rhythm. Tachycardia present.     Pulses: Normal pulses.     Heart sounds: Normal heart sounds.  Pulmonary:     Breath sounds: Normal breath sounds.     Comments: Tachypneic Abdominal:     General: Bowel sounds are normal.     Palpations: Abdomen is soft.  Skin:    General: Skin is warm.     Comments: Left leg in wound vac. Less serosanguinous drainage today. 2+ right leg edema  Neurological:     Mental Status: She is alert.     Filed Weights   04/02/22 1244 04/04/22 0413  Weight: 77.1 kg 76.1 kg     Intake/Output Summary (Last 24 hours) at 04/05/2022 0636 Last data filed at 04/05/2022 0428 Gross per 24 hour  Intake 2127 ml  Output 2000 ml  Net 127 ml    Net IO Since Admission: 2,095.32 mL [04/05/22 0636]  Pertinent Labs:    Latest Ref Rng & Units 04/04/2022    5:41 AM 04/03/2022    5:03 AM 04/02/2022   12:16 PM  CBC  WBC 4.0 - 10.5 K/uL 4.0 - 10.5 K/uL 16.9    16.8  6.7    Hemoglobin 12.0 - 15.0  g/dL 01.7 - 51.0 g/dL 8.2    8.4  8.8  25.8   Hematocrit 36.0 - 46.0 % 36.0 - 46.0 % 23.3    23.8  24.6  30.0   Platelets 150 - 400 K/uL 150 - 400 K/uL 135    127  180         Latest Ref Rng & Units 04/05/2022    4:44 AM 04/04/2022    9:00 PM 04/04/2022    5:41 AM  CMP  Glucose 70 - 99 mg/dL 527  782  423   BUN 8 - 23 mg/dL 25  24  27    Creatinine 0.44 - 1.00 mg/dL  5.36  1.44   Sodium 135 - 145 mmol/L 126  125  125   Potassium 3.5 - 5.1 mmol/L 3.7  3.6  3.5   Chloride 98 - 111 mmol/L 96  95  94   CO2 22 - 32 mmol/L 18  18  19    Calcium 8.9 - 10.3 mg/dL 7.5  7.4  7.7     Imaging: No results  found.  Assessment/Plan:   Principal Problem:   Necrotizing fasciitis of lower leg (HCC) Active Problems:   Hyponatremia   Hypotension   Bacteremia due to Streptococcus Pyogenes   Type 2 diabetes mellitus with hyperglycemia (HCC)   Beta-hemolytic group A streptococcal sepsis (HCC)   Patient Summary: Jill Munoz is a 71 y.o. with no known PMHx, who presented with worsening left hemorrhagic bulla of left leg  and admitted for necrotizing fasciitis of left leg, S. Pyogenes bacteremia, hypotonic hypovolemic hyponatremia and uncontrolled T2DM.   # Necrotizing Fasciitis of left leg s/p left AKA Need further debridement 6/23 per ortho. Per op note, left leg was completely nonviable. Surgical culture showed no WBC, moderate GPC in pairs and few GPC in chains.  - Telemetry monitoring - Orthopedic surgery following; appreciate their recommendations and assistance - Continue penicillin and linezolid (day 3 of abx) - Tylenol PRN for fever, mild pain  - Norco PRN for severe pain - RD consult for dietary needs  #S. Pyogenes bacteremia BCID shows S. Pyogenes. Likely source of infection from left leg wound. Tachycardia and tachypnea likely secondary to bacteremia. Leucocytosis likely delayed reactive.  -Continue abx as above -Repeat Bcx 6/21 no growth <24 hr -Plan for PICC if no  growth 48 hr -ID following, greatly appreciate recommendations and assistance -Delirium precautions given she is at high risk for hospital delirium   #Hypotonic Hypovolemic Hyponatremia  Multifactorial likely due to necrotizing fasciitis, bacteremia, poor PO intake. Most recent sodium 126. Getting some IV repletion already with PCN q4h. - BMP qd - Will provide gentle hydration with boluses as needed to slowly titrate serum sodium to normal   # Hypotension Likely 2/2 to poor PO intake and bacteremia/nec fasc. BP has been responsive to IVF resuscitation. -Continue to monitor BP post-op -will provide gentle fluid hydration as needed   # Acute Kidney Injury # Neurogenic Bladder Cr stable. Continue foley - Continue monitoring renal function daily - Continue IVF resuscitation as needed   # Anion Gap Metabolic Acidosis  Stable bicarb 19.  - Monitor bicarb daily - No indication for bicarb infusion   # Type 2 Diabetes Mellitus  A1c 11%.   -Continue Semglee 5 u - SSI, moderate and CBG qac and qhs - Consult to diabetes coordinator for newly diagnosed T2DM   # Iron Deficiency Anemia # Microcytic Anemia  No known prior history. Iron saturation ratio 3%.Received 2 u RBC post op - Monitor CBC daily - Transfuse for hemoglobin < 7 - Reticulocyte ordered  #Thrombocytopenia Platelet 135. Possibly dilutional. 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 -Will need GDMT but will start these after she is more stable from other acute problems -Outpatient cardiology follow up   # Left Subdural Hygroma  Incidental finding on CT head. Given bacteremia, needs monitoring if it turns into an abscess   # Diabetes induced neuropathy # Chronic Hand Numbness Given A1c elevation, I suspect this may be secondary to peripheral neuropathy in the setting of uncontrolled diabetes. B12 exorbitantly high at 4,868 and TSH 0.861. - Outpatient follow up recommended  - TOC to  establish PCP needs  Diet: NPO for procedure, but will resume Carb-Modified after IVF: none VTE: None today Code: Full PT/OT recs: Pending, none. TOC recs: pending   Dispo: Anticipated discharge to Skilled nursing facility in 7 days pending debridement of LLE, management of bacteremia, good insulin regiment, resolution of hyponatremia.   Park Pope, MD 04/05/2022, 6:36 AM Pager: [phone]  Please  contact the on call pager after 5 pm and on weekends at [phone].

## 2022-04-06 ENCOUNTER — Inpatient Hospital Stay (HOSPITAL_COMMUNITY): Payer: Medicare Other | Admitting: Anesthesiology

## 2022-04-06 ENCOUNTER — Other Ambulatory Visit: Payer: Self-pay

## 2022-04-06 ENCOUNTER — Encounter (HOSPITAL_COMMUNITY)
Admission: EM | Disposition: A | Discharge status: Skilled Nursing Facility | Payer: Self-pay | Source: Home / Self Care | Attending: Internal Medicine

## 2022-04-06 ENCOUNTER — Encounter (HOSPITAL_COMMUNITY): Payer: Self-pay | Admitting: Internal Medicine

## 2022-04-06 DIAGNOSIS — M726 Necrotizing fasciitis: Secondary | ICD-10-CM | POA: Diagnosis not present

## 2022-04-06 DIAGNOSIS — B955 Unspecified streptococcus as the cause of diseases classified elsewhere: Secondary | ICD-10-CM | POA: Diagnosis not present

## 2022-04-06 DIAGNOSIS — E119 Type 2 diabetes mellitus without complications: Secondary | ICD-10-CM | POA: Diagnosis not present

## 2022-04-06 DIAGNOSIS — A400 Sepsis due to streptococcus, group A: Secondary | ICD-10-CM | POA: Diagnosis not present

## 2022-04-06 DIAGNOSIS — R7881 Bacteremia: Secondary | ICD-10-CM | POA: Diagnosis not present

## 2022-04-06 HISTORY — PX: I & D EXTREMITY: SHX5045

## 2022-04-06 HISTORY — PX: AMPUTATION: SHX166

## 2022-04-06 LAB — CBC WITH DIFFERENTIAL/PLATELET
Abs Immature Granulocytes: 0 10*3/uL (ref 0.00–0.07)
Basophils Absolute: 0 10*3/uL (ref 0.0–0.1)
Basophils Relative: 0 %
Eosinophils Absolute: 0 10*3/uL (ref 0.0–0.5)
Eosinophils Relative: 0 %
HCT: 33.7 % — ABNORMAL LOW (ref 36.0–46.0)
Hemoglobin: 11.8 g/dL — ABNORMAL LOW (ref 12.0–15.0)
Lymphocytes Relative: 4 %
Lymphs Abs: 0.8 10*3/uL (ref 0.7–4.0)
MCH: 28 pg (ref 26.0–34.0)
MCHC: 35 g/dL (ref 30.0–36.0)
MCV: 79.9 fL — ABNORMAL LOW (ref 80.0–100.0)
Monocytes Absolute: 0 10*3/uL — ABNORMAL LOW (ref 0.1–1.0)
Monocytes Relative: 0 %
Neutro Abs: 19 10*3/uL — ABNORMAL HIGH (ref 1.7–7.7)
Neutrophils Relative %: 96 %
Platelets: 76 10*3/uL — ABNORMAL LOW (ref 150–400)
RBC: 4.22 MIL/uL (ref 3.87–5.11)
RDW: 16 % — ABNORMAL HIGH (ref 11.5–15.5)
WBC: 19.8 10*3/uL — ABNORMAL HIGH (ref 4.0–10.5)
nRBC: 0 /100 WBC
nRBC: 0.2 % (ref 0.0–0.2)

## 2022-04-06 LAB — PROTIME-INR
INR: 1.2 (ref 0.8–1.2)
Prothrombin Time: 15.1 seconds (ref 11.4–15.2)

## 2022-04-06 LAB — BASIC METABOLIC PANEL
Anion gap: 12 (ref 5–15)
BUN: 20 mg/dL (ref 8–23)
CO2: 17 mmol/L — ABNORMAL LOW (ref 22–32)
Calcium: 7.6 mg/dL — ABNORMAL LOW (ref 8.9–10.3)
Chloride: 99 mmol/L (ref 98–111)
Creatinine, Ser: 0.92 mg/dL (ref 0.44–1.00)
GFR, Estimated: >60 mL/min (ref >60)
Glucose, Bld: 182 mg/dL — ABNORMAL HIGH (ref 70–99)
Potassium: 3.2 mmol/L — ABNORMAL LOW (ref 3.5–5.1)
Sodium: 128 mmol/L — ABNORMAL LOW (ref 135–145)

## 2022-04-06 LAB — GLUCOSE, CAPILLARY
Glucose-Capillary: 158 mg/dL — ABNORMAL HIGH (ref 70–99)
Glucose-Capillary: 155 mg/dL — ABNORMAL HIGH (ref 70–99)
Glucose-Capillary: 133 mg/dL — ABNORMAL HIGH (ref 70–99)
Glucose-Capillary: 109 mg/dL — ABNORMAL HIGH (ref 70–99)
Glucose-Capillary: 88 mg/dL (ref 70–99)

## 2022-04-06 LAB — TECHNOLOGIST SMEAR REVIEW: Plt Morphology: NORMAL

## 2022-04-06 LAB — APTT: aPTT: 22 seconds — ABNORMAL LOW (ref 24–36)

## 2022-04-06 LAB — MAGNESIUM: Magnesium: 1.5 mg/dL — ABNORMAL LOW (ref 1.7–2.4)

## 2022-04-06 SURGERY — IRRIGATION AND DEBRIDEMENT EXTREMITY
Anesthesia: General | Site: Knee | Laterality: Left

## 2022-04-06 MED ORDER — KETOROLAC TROMETHAMINE 10 MG PO TABS: 10.0000 mg | ORAL_TABLET | Freq: Three times a day (TID) | ORAL | Status: DC | PRN

## 2022-04-06 MED ORDER — ONDANSETRON HCL 4 MG/2ML IJ SOLN
4.0000 mg | Freq: Four times a day (QID) | INTRAMUSCULAR | Status: DC | PRN
  Administered 2022-04-06 – 2022-04-08 (×5): 4 mg via INTRAVENOUS
  Filled 2022-04-06 (×5): qty 2

## 2022-04-06 MED ORDER — PHENYLEPHRINE 80 MCG/ML (10ML) SYRINGE FOR IV PUSH (FOR BLOOD PRESSURE SUPPORT)
PREFILLED_SYRINGE | INTRAVENOUS | Status: DC | PRN
  Administered 2022-04-06 (×2): 80 ug via INTRAVENOUS
  Administered 2022-04-06 (×2): 160 ug via INTRAVENOUS

## 2022-04-06 MED ORDER — CHLORHEXIDINE GLUCONATE 0.12 % MT SOLN
15.0000 mL | Freq: Once | OROMUCOSAL | Status: AC
  Administered 2022-04-06: 15 mL via OROMUCOSAL
  Filled 2022-04-06: qty 15

## 2022-04-06 MED ORDER — CEFAZOLIN SODIUM-DEXTROSE 2-4 GM/100ML-% IV SOLN
2.0000 g | Freq: Three times a day (TID) | INTRAVENOUS | Status: AC
  Administered 2022-04-06 (×2): 2 g via INTRAVENOUS
  Filled 2022-04-06 (×2): qty 100

## 2022-04-06 MED ORDER — 0.9 % SODIUM CHLORIDE (POUR BTL) OPTIME
TOPICAL | Status: DC | PRN
  Administered 2022-04-06: 1000 mL

## 2022-04-06 MED ORDER — POTASSIUM CHLORIDE 10 MEQ/100ML IV SOLN: 10.0000 meq | INTRAVENOUS | Status: DC

## 2022-04-06 MED ORDER — ONDANSETRON HCL 4 MG/2ML IJ SOLN
INTRAMUSCULAR | Status: DC | PRN
  Administered 2022-04-06: 4 mg via INTRAVENOUS

## 2022-04-06 MED ORDER — POTASSIUM CHLORIDE CRYS ER 20 MEQ PO TBCR: 20.0000 meq | EXTENDED_RELEASE_TABLET | Freq: Every day | ORAL | Status: DC | PRN

## 2022-04-06 MED ORDER — SODIUM CHLORIDE 0.9 % IV SOLN
INTRAVENOUS | Status: DC
Start: 2022-04-06 — End: 2022-04-08

## 2022-04-06 MED ORDER — LIDOCAINE 2% (20 MG/ML) 5 ML SYRINGE
INTRAMUSCULAR | Status: DC | PRN
  Administered 2022-04-06: 40 mg via INTRAVENOUS

## 2022-04-06 MED ORDER — OXYCODONE HCL 5 MG PO TABS: 5.0000 mg | ORAL_TABLET | Freq: Once | ORAL | Status: DC | PRN

## 2022-04-06 MED ORDER — HYDRALAZINE HCL 20 MG/ML IJ SOLN: 5.0000 mg | INTRAMUSCULAR | Status: DC | PRN

## 2022-04-06 MED ORDER — OXYCODONE HCL 5 MG PO TABS
10.0000 mg | ORAL_TABLET | ORAL | Status: DC | PRN
  Filled 2022-04-06: qty 2

## 2022-04-06 MED ORDER — HYDROMORPHONE HCL 1 MG/ML IJ SOLN
0.5000 mg | INTRAMUSCULAR | Status: DC | PRN
  Administered 2022-04-06: 1 mg via INTRAVENOUS
  Filled 2022-04-06: qty 1

## 2022-04-06 MED ORDER — ALUM & MAG HYDROXIDE-SIMETH 200-200-20 MG/5ML PO SUSP
15.0000 mL | ORAL | Status: DC | PRN
  Administered 2022-04-10 – 2022-04-11 (×2): 30 mL via ORAL
  Filled 2022-04-06 (×2): qty 30

## 2022-04-06 MED ORDER — FENTANYL CITRATE (PF) 100 MCG/2ML IJ SOLN: 25.0000 ug | INTRAMUSCULAR | Status: DC | PRN

## 2022-04-06 MED ORDER — POLYETHYLENE GLYCOL 3350 17 G PO PACK: 17.0000 g | PACK | Freq: Every day | ORAL | Status: DC | PRN

## 2022-04-06 MED ORDER — ZINC SULFATE 220 (50 ZN) MG PO CAPS
220.0000 mg | ORAL_CAPSULE | Freq: Every day | ORAL | Status: DC
  Administered 2022-04-07 – 2022-04-13 (×5): 220 mg via ORAL
  Filled 2022-04-06 (×7): qty 1

## 2022-04-06 MED ORDER — FENTANYL CITRATE (PF) 100 MCG/2ML IJ SOLN
INTRAMUSCULAR | Status: DC | PRN
  Administered 2022-04-06 (×2): 25 ug via INTRAVENOUS

## 2022-04-06 MED ORDER — ACETAMINOPHEN 500 MG PO TABS
1000.0000 mg | ORAL_TABLET | Freq: Three times a day (TID) | ORAL | Status: DC
  Administered 2022-04-07 – 2022-04-16 (×13): 1000 mg via ORAL
  Filled 2022-04-06 (×21): qty 2

## 2022-04-06 MED ORDER — POTASSIUM CHLORIDE 10 MEQ/100ML IV SOLN
10.0000 meq | INTRAVENOUS | Status: AC
  Administered 2022-04-06 (×3): 10 meq via INTRAVENOUS
  Filled 2022-04-06 (×3): qty 100

## 2022-04-06 MED ORDER — OXYCODONE HCL 5 MG PO TABS
5.0000 mg | ORAL_TABLET | ORAL | Status: DC | PRN
  Administered 2022-04-07 – 2022-04-16 (×10): 5 mg via ORAL
  Filled 2022-04-06 (×12): qty 1

## 2022-04-06 MED ORDER — PHENOL 1.4 % MT LIQD: 1.0000 | OROMUCOSAL | Status: DC | PRN

## 2022-04-06 MED ORDER — PROPOFOL 10 MG/ML IV BOLUS
INTRAVENOUS | Status: AC
  Filled 2022-04-06: qty 20

## 2022-04-06 MED ORDER — ONDANSETRON HCL 4 MG/2ML IJ SOLN: 4.0000 mg | Freq: Four times a day (QID) | INTRAMUSCULAR | Status: DC | PRN

## 2022-04-06 MED ORDER — LACTATED RINGERS IV SOLN: INTRAVENOUS | Status: DC

## 2022-04-06 MED ORDER — LIDOCAINE 2% (20 MG/ML) 5 ML SYRINGE
INTRAMUSCULAR | Status: AC
  Filled 2022-04-06: qty 5

## 2022-04-06 MED ORDER — ASCORBIC ACID 500 MG PO TABS
1000.0000 mg | ORAL_TABLET | Freq: Every day | ORAL | Status: DC
  Administered 2022-04-07 – 2022-04-13 (×6): 1000 mg via ORAL
  Filled 2022-04-06 (×7): qty 2

## 2022-04-06 MED ORDER — GUAIFENESIN-DM 100-10 MG/5ML PO SYRP: 15.0000 mL | ORAL_SOLUTION | ORAL | Status: DC | PRN

## 2022-04-06 MED ORDER — PROPOFOL 10 MG/ML IV BOLUS
INTRAVENOUS | Status: DC | PRN
  Administered 2022-04-06: 120 mg via INTRAVENOUS

## 2022-04-06 MED ORDER — BISACODYL 5 MG PO TBEC: 5.0000 mg | DELAYED_RELEASE_TABLET | Freq: Every day | ORAL | Status: DC | PRN

## 2022-04-06 MED ORDER — ACETAMINOPHEN 325 MG PO TABS: 325.0000 mg | ORAL_TABLET | Freq: Four times a day (QID) | ORAL | Status: DC | PRN

## 2022-04-06 MED ORDER — DOCUSATE SODIUM 100 MG PO CAPS
100.0000 mg | ORAL_CAPSULE | Freq: Every day | ORAL | Status: DC
  Administered 2022-04-07 – 2022-04-09 (×2): 100 mg via ORAL
  Filled 2022-04-06 (×4): qty 1

## 2022-04-06 MED ORDER — MAGNESIUM SULFATE 2 GM/50ML IV SOLN: 2.0000 g | Freq: Every day | INTRAVENOUS | Status: DC | PRN

## 2022-04-06 MED ORDER — PANTOPRAZOLE SODIUM 40 MG PO TBEC
40.0000 mg | DELAYED_RELEASE_TABLET | Freq: Every day | ORAL | Status: DC
  Filled 2022-04-06: qty 1

## 2022-04-06 MED ORDER — METOPROLOL TARTRATE 5 MG/5ML IV SOLN: 2.0000 mg | INTRAVENOUS | Status: DC | PRN

## 2022-04-06 MED ORDER — OXYCODONE HCL 5 MG/5ML PO SOLN: 5.0000 mg | Freq: Once | ORAL | Status: DC | PRN

## 2022-04-06 MED ORDER — EPHEDRINE SULFATE-NACL 50-0.9 MG/10ML-% IV SOSY
PREFILLED_SYRINGE | INTRAVENOUS | Status: DC | PRN
  Administered 2022-04-06: 5 mg via INTRAVENOUS

## 2022-04-06 MED ORDER — FENTANYL CITRATE (PF) 250 MCG/5ML IJ SOLN
INTRAMUSCULAR | Status: AC
  Filled 2022-04-06: qty 5

## 2022-04-06 MED ORDER — MAGNESIUM CITRATE PO SOLN: 1.0000 | Freq: Once | ORAL | Status: DC | PRN

## 2022-04-06 MED ORDER — OXYCODONE HCL 5 MG PO TABS: 5.0000 mg | ORAL_TABLET | ORAL | Status: DC | PRN

## 2022-04-06 MED ORDER — LABETALOL HCL 5 MG/ML IV SOLN: 10.0000 mg | INTRAVENOUS | Status: DC | PRN

## 2022-04-06 MED ORDER — ENOXAPARIN SODIUM 40 MG/0.4ML IJ SOSY
40.0000 mg | PREFILLED_SYRINGE | INTRAMUSCULAR | Status: DC
Start: 2022-04-06 — End: 2022-04-11
  Administered 2022-04-06 – 2022-04-10 (×5): 40 mg via SUBCUTANEOUS
  Filled 2022-04-06 (×5): qty 0.4

## 2022-04-06 MED ORDER — JUVEN PO PACK
1.0000 | PACK | Freq: Two times a day (BID) | ORAL | Status: DC
  Administered 2022-04-07 – 2022-04-16 (×11): 1 via ORAL
  Filled 2022-04-06 (×13): qty 1

## 2022-04-06 SURGICAL SUPPLY — 41 items
BAG COUNTER SPONGE SURGICOUNT (BAG) IMPLANT
BLADE SAW RECIP 87.9 MT (BLADE) ×1 IMPLANT
BLADE SURG 21 STRL SS (BLADE) ×3 IMPLANT
BNDG COHESIVE 6X5 TAN STRL LF (GAUZE/BANDAGES/DRESSINGS) IMPLANT
BNDG GAUZE ELAST 4 BULKY (GAUZE/BANDAGES/DRESSINGS) ×4 IMPLANT
CANISTER WOUND CARE 500ML ATS (WOUND CARE) ×1 IMPLANT
COVER SURGICAL LIGHT HANDLE (MISCELLANEOUS) ×6 IMPLANT
DRAPE INCISE IOBAN 66X45 STRL (DRAPES) ×1 IMPLANT
DRAPE U-SHAPE 47X51 STRL (DRAPES) ×3 IMPLANT
DRESSING VERAFLO CLEANS CC MED (GAUZE/BANDAGES/DRESSINGS) IMPLANT
DRSG ADAPTIC 3X8 NADH LF (GAUZE/BANDAGES/DRESSINGS) ×2 IMPLANT
DRSG VERAFLO CLEANSE CC MED (GAUZE/BANDAGES/DRESSINGS) ×3
DURAPREP 26ML APPLICATOR (WOUND CARE) ×3 IMPLANT
ELECT REM PT RETURN 9FT ADLT (ELECTROSURGICAL) ×3
ELECTRODE REM PT RTRN 9FT ADLT (ELECTROSURGICAL) IMPLANT
GAUZE SPONGE 4X4 12PLY STRL (GAUZE/BANDAGES/DRESSINGS) ×2 IMPLANT
GLOVE BIO SURGEON STRL SZ7 (GLOVE) ×1 IMPLANT
GLOVE BIOGEL PI IND STRL 7.5 (GLOVE) IMPLANT
GLOVE BIOGEL PI IND STRL 9 (GLOVE) ×2 IMPLANT
GLOVE BIOGEL PI INDICATOR 7.5 (GLOVE) ×1
GLOVE BIOGEL PI INDICATOR 9 (GLOVE) ×1
GLOVE SURG ORTHO 9.0 STRL STRW (GLOVE) ×3 IMPLANT
GOWN STRL REUS W/ TWL XL LVL3 (GOWN DISPOSABLE) ×4 IMPLANT
GOWN STRL REUS W/TWL XL LVL3 (GOWN DISPOSABLE) ×2
GRAFT SKIN WND SURGICLOSE M95 (Tissue) ×1 IMPLANT
HANDPIECE INTERPULSE COAX TIP (DISPOSABLE)
KIT BASIN OR (CUSTOM PROCEDURE TRAY) ×3 IMPLANT
KIT TURNOVER KIT B (KITS) ×3 IMPLANT
MANIFOLD NEPTUNE II (INSTRUMENTS) ×3 IMPLANT
NS IRRIG 1000ML POUR BTL (IV SOLUTION) ×3 IMPLANT
PACK ORTHO EXTREMITY (CUSTOM PROCEDURE TRAY) ×3 IMPLANT
PAD ARMBOARD 7.5X6 YLW CONV (MISCELLANEOUS) ×6 IMPLANT
SET HNDPC FAN SPRY TIP SCT (DISPOSABLE) IMPLANT
STOCKINETTE IMPERVIOUS 9X36 MD (GAUZE/BANDAGES/DRESSINGS) IMPLANT
SUT ETHILON 2 0 PSLX (SUTURE) ×3 IMPLANT
SUT SILK 2 0 TIES 10X30 (SUTURE) ×1 IMPLANT
SWAB COLLECTION DEVICE MRSA (MISCELLANEOUS) ×2 IMPLANT
SWAB CULTURE ESWAB REG 1ML (MISCELLANEOUS) IMPLANT
TOWEL GREEN STERILE (TOWEL DISPOSABLE) ×3 IMPLANT
TUBE CONNECTING 12X1/4 (SUCTIONS) ×3 IMPLANT
YANKAUER SUCT BULB TIP NO VENT (SUCTIONS) ×3 IMPLANT

## 2022-04-06 NOTE — Care Management Important Message (Signed)
Important Message  Patient Details  Name: Jill Munoz MRN: [id number] Date of Birth: 01-25-1951   Medicare Important Message Given:  Yes     Renie Ora 04/06/2022, 10:01 AM

## 2022-04-07 LAB — GLUCOSE, CAPILLARY
Glucose-Capillary: 123 mg/dL — ABNORMAL HIGH (ref 70–99)
Glucose-Capillary: 168 mg/dL — ABNORMAL HIGH (ref 70–99)
Glucose-Capillary: 111 mg/dL — ABNORMAL HIGH (ref 70–99)
Glucose-Capillary: 120 mg/dL — ABNORMAL HIGH (ref 70–99)

## 2022-04-07 LAB — AEROBIC/ANAEROBIC CULTURE W GRAM STAIN (SURGICAL/DEEP WOUND): Gram Stain: NONE SEEN

## 2022-04-07 LAB — BASIC METABOLIC PANEL
Anion gap: 10 (ref 5–15)
BUN: 17 mg/dL (ref 8–23)
CO2: 19 mmol/L — ABNORMAL LOW (ref 22–32)
Calcium: 7.3 mg/dL — ABNORMAL LOW (ref 8.9–10.3)
Chloride: 102 mmol/L (ref 98–111)
Creatinine, Ser: 0.87 mg/dL (ref 0.44–1.00)
GFR, Estimated: >60 mL/min (ref >60)
Glucose, Bld: 133 mg/dL — ABNORMAL HIGH (ref 70–99)
Potassium: 3.4 mmol/L — ABNORMAL LOW (ref 3.5–5.1)
Sodium: 131 mmol/L — ABNORMAL LOW (ref 135–145)

## 2022-04-07 LAB — CULTURE, BLOOD (ROUTINE X 2): Culture: NO GROWTH

## 2022-04-07 LAB — CBC
HCT: 25.5 % — ABNORMAL LOW (ref 36.0–46.0)
Hemoglobin: 9.1 g/dL — ABNORMAL LOW (ref 12.0–15.0)
MCH: 28.7 pg (ref 26.0–34.0)
MCHC: 35.7 g/dL (ref 30.0–36.0)
MCV: 80.4 fL (ref 80.0–100.0)
Platelets: 54 10*3/uL — ABNORMAL LOW (ref 150–400)
RBC: 3.17 MIL/uL — ABNORMAL LOW (ref 3.87–5.11)
RDW: 16.6 % — ABNORMAL HIGH (ref 11.5–15.5)
WBC: 10.5 10*3/uL (ref 4.0–10.5)
nRBC: 0.2 % (ref 0.0–0.2)

## 2022-04-07 LAB — MAGNESIUM: Magnesium: 1.4 mg/dL — ABNORMAL LOW (ref 1.7–2.4)

## 2022-04-07 LAB — ZINC: Zinc: 34 ug/dL — ABNORMAL LOW (ref 44–115)

## 2022-04-07 LAB — VITAMIN B1: Vitamin B1 (Thiamine): 209.9 nmol/L — ABNORMAL HIGH (ref 66.5–200.0)

## 2022-04-07 MED ORDER — PANTOPRAZOLE SODIUM 40 MG PO TBEC
40.0000 mg | DELAYED_RELEASE_TABLET | Freq: Every day | ORAL | Status: DC
  Administered 2022-04-08 – 2022-04-16 (×5): 40 mg via ORAL
  Filled 2022-04-07 (×6): qty 1

## 2022-04-07 MED ORDER — POLYETHYLENE GLYCOL 3350 17 G PO PACK
17.0000 g | PACK | Freq: Every day | ORAL | Status: DC
  Administered 2022-04-07 – 2022-04-13 (×5): 17 g via ORAL
  Filled 2022-04-07 (×5): qty 1

## 2022-04-07 MED ORDER — BISACODYL 5 MG PO TBEC
5.0000 mg | DELAYED_RELEASE_TABLET | Freq: Every day | ORAL | Status: DC
  Administered 2022-04-07 – 2022-04-09 (×2): 5 mg via ORAL
  Filled 2022-04-07 (×3): qty 1

## 2022-04-07 MED ORDER — POTASSIUM CHLORIDE 20 MEQ PO PACK
40.0000 meq | PACK | Freq: Once | ORAL | Status: AC
  Administered 2022-04-07: 40 meq via ORAL
  Filled 2022-04-07: qty 2

## 2022-04-07 MED ORDER — CEFAZOLIN SODIUM-DEXTROSE 2-4 GM/100ML-% IV SOLN
2.0000 g | Freq: Three times a day (TID) | INTRAVENOUS | Status: DC
  Administered 2022-04-07 – 2022-04-11 (×12): 2 g via INTRAVENOUS
  Filled 2022-04-07 (×12): qty 100

## 2022-04-07 NOTE — H&P (View-Only) (Signed)
Patient ID: Jill Munoz, female   DOB: 1950/12/26, 71 y.o.   MRN: [id number] Patient is postoperative day 1 revision above-knee amputation with application of Kerecis micro graft for necrotizing fasciitis of the left leg.  Patient looks much better this morning her sodium is improving at 131.  Hemoglobin stable at 9.1.  The white cell count has decreased from 19.8-10.5.  There is 400 cc in the wound VAC canister.  Anticipate 1 final debridement on Wednesday with wound closure.

## 2022-04-07 NOTE — TOC Initial Note (Signed)
Transition of Care [facility]) - Initial/Assessment Note    Patient Details  Name: Jill Munoz MRN: [id number] Date of Birth: 1950-11-15  Transition of Care [facility]) CM/SW Contact:    Ralene Bathe, LCSWA Phone Number: 04/07/2022, 10:25 AM  Clinical Narrative:                 CSW received consult for possible SNF placement at time of discharge. CSW spoke with patient's spouse as the patient wanted spouse to manage discharge planning.  Patient's spouse expressed understanding of PT recommendation and is agreeable to SNF placement at time of discharge if the patient is not a candidate for inpatient rehab.   The family does not have a preference, but would like to view the facility prior to making a choice.   CSW discussed insurance authorization process and will provide Medicare SNF ratings list. Patient has not received any COVID vaccines. CSW will send out referrals for review.  No further questions reported at this time.   Skilled Nursing Rehab Facilities-   [email or web address]   Ratings out of 5 possible   Name Address  Phone # Quality Care Staffing Health Inspection Overall  [facility] [address], [place] [phone] 4 5 2 3   [facility]  [address] [place], [place] [phone] 3 2 5 5   [facility] [address] [place], [place] [phone] 3 1 1 1   [facility] [address] [phone] 3 2 4 4   [facility] [address], [place] [phone] 1 1 2 1   [facility] [phone] [address], [place] [phone] [facility] [address], [place] [phone] 5 2 3 4   [facility] [address], [place] [phone] 5 2 2 3   [address] ([place]) [address], [place] [phone] 5 1 2 2   [facility] [phone] [address], Ginette Otto [phone] 4 1 2 1   [facility] [address] [facility] [phone] 4 1 2 1   [facility]  ([place]) [address], [place] [phone] 4 1 1 1   [facility] [address] [place] [phone] 3 2 4 4           [facility] [address] [place] [phone]      [facility] [address] [place] [phone] 4 2 3 3   [facility] [place] [address] [place] Ditto [phone] 4 1 5 4   [address], [place] [address], [place] [phone] 2 1 1 1   [facility] [address] [employer] [phone] 2 1 3 2           [address] (no [facility]) [address], [place] [phone] 4 5 5 5   [facility] (No [employer]) [address] [place], [place] [phone] 3 1 4 3   [facility] (No [employer]) [address], [place] [phone] 5 5 5 5   [facility] [address] [employer] [phone] 3 2 4 4   [facility] [address], [place] [phone] 1 1 2 1   [facility] [address] [place] [phone] 2 1 1 1   [facility] [address] Lil[place] [phone] 5 2 4 5   [facility] [address], [place] [phone] 3 1 1 1   [facility] [address] [place], [place] [phone] 2 1 2 1           [facility] [address], [place] [phone] 1 1 1 1   [facility] [address]  Dr, Evlyn Clines  [phone] 2 4 2 2   [facility] [place] [address], [place]alita Levan [phone] 5 2 3 4   [facility] [place] [address] [place] [phone] 2 1 1 1   [facility] (No Humana) [address], [place] [phone] 2 1 3 2   [facility] [address], [place]an [phone] 3 1 1 1           [facility] [address] [place], [place] [phone] 5 4 5 5   [facility])  [address], [place] [phone] 2 2 3 3   [facility]) [address] [place], [place] [phone] 3 2 4 4   [facility] [place] [address] [place] [phone] 4 3 4 4   [address] [place], [place]  [phone] 3 3 1 1   [facility]) [address] [place] [phone] 2 2 4 4      Expected Discharge Plan: IP Rehab Facility Barriers to Discharge: Continued Medical Work up   Patient Goals and CMS Choice Patient states their goals for this hospitalization and ongoing recovery are:: To receive rehab in the hospital CMS Medicare.gov Compare Post Acute Care list provided to:: Patient Represenative (must comment) Choice offered to / list presented to : Spouse  Expected Discharge Plan and Services Expected Discharge Plan: IP Rehab Facility       Living arrangements for the past 2 months: Single Family Home                                      Prior Living Arrangements/Services Living arrangements for the past 2 months: Single Family Home Lives with:: Self Patient language and need for interpreter reviewed:: Yes Do you feel safe going back to the place where you live?: Yes      Need for Family Participation in Patient Care: Yes (Comment) Care giver support system in place?: Yes (comment)   Criminal Activity/Legal Involvement Pertinent to Current Situation/Hospitalization: No - Comment as needed  Activities of Daily Living      Permission Sought/Granted Permission sought to share information with : Family Supports, Other (comment) (CSW) Permission granted to share information with : Yes, Verbal Permission Granted  Share Information with NAME: Husband, Asghar  Permission granted to share info w AGENCY: SNF        Emotional Assessment       Orientation: : Oriented to Self, Oriented to Place, Oriented to  Time, Oriented to Situation Alcohol / Substance Use: Not Applicable Psych Involvement: No (comment)  Admission diagnosis:  Necrotizing soft tissue infection [M79.89] Patient Active Problem List   Diagnosis Date Noted   Bacteremia due to Streptococcus Pyogenes 04/03/2022   Type 2 diabetes mellitus with hyperglycemia (HCC)  04/03/2022   Beta-hemolytic group A streptococcal sepsis (HCC)    Necrotizing fasciitis (HCC) 04/02/2022   Hyponatremia 04/02/2022   Hypotension 04/02/2022   PCP:  Pcp, No Pharmacy:   [facility] [address] [facility] [place], [place] - [address] [place] [address] Phone: [phone] Fax: [phone]     Social Determinants of Health (SDOH) Interventions    Readmission Risk Interventions     No data to display

## 2022-04-07 NOTE — Hospital Course (Addendum)
Jill Munoz is a 71 y.o.female with no known PMHx who was admitted to [facility] for necrotizing fasciitis complicated by group a streptococcus bacteremia. Hospital course is detailed below:  Left Leg Necrotizing fasciitis Patient was admitted for 4-day history of progressively worsening hemorrhagic bullae.  CT imaging consistent with necrotizing soft tissue infection.  Patient had diffuse involvement of left leg, foot, knee.  Orthopedic surgery emergently went for irrigation and debridement.  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  Patient had 2 further revisions of left knee amputation before wound closure to ensure amputation had healthy margins.  Patient recommended for SNF.  Group A strep bacteremia Patient's blood culture grew strep pyogenes.  Likely source of infection from left leg wound.  Patient was initially started on penicillin and linezolid.  Repeat blood cultures obtained 2 days after initial blood culture showed no growth. Patient was eventually transitioned to cefazolin. After good source control with revision amputation, patient was ultimately was discharged to finish 14-day course of cefadroxil.  Hypotonic hypovolemic hyponatremia Patient's sodium was low down to 120.  Suspected that this is secondary to poor p.o. intake as well as necrotizing fasciitis.  Patient received IV fluid and slowly was titrated up to normal range. Patient's sodium near time of discharge discharge was consistently in the 130s.   AKI Neurogenic bladder AKI likely prerenal and secondary to poor p.o. intake as well as bacteremia.  Patient had difficulty urinating initially with concerns of neurogenic bladder.  Patient required a Foley catheter but was able to successfully transition off this with successful voiding trials.  HFrEF New diagnosis. Echo showed 40-45% EF with global hypokinesis. Patient was started on metoprolol 12.5 mg bid at  discharge.   Iron Deficiency Anemia Patient's iron saturation ratio of 3%. Received 4 units PRBC due to debridement and AKA. Patient received a dose of feraheme prior to discharge for further iron supplementation. Recommended to start PO iron supplementation as an outpatient.   T2DM Diabetic Neuropathy New diagnosis.  A1c 11%.  Patient was placed on a sliding scale insulin and long-acting while in hospital.  Given patient's fasting glucose was persistently in the low 100s despite holding long-acting insulin near time of discharge, decision was to discharge patient with metformin rather than insulin.  Low CBGs likely secondary to lack of appetite while in hospital.   __________________________________________________  Feeling better this morning. She has been urinating regularly this morning after the in and out catheter. Denies stomach pain.

## 2022-04-07 NOTE — Plan of Care (Signed)
  Problem: Education: Goal: Knowledge of General Education information will improve Description Including pain rating scale, medication(s)/side effects and non-pharmacologic comfort measures Outcome: Progressing   Problem: Health Behavior/Discharge Planning: Goal: Ability to manage health-related needs will improve Outcome: Progressing   

## 2022-04-08 DIAGNOSIS — M726 Necrotizing fasciitis: Secondary | ICD-10-CM | POA: Diagnosis not present

## 2022-04-08 LAB — CBC
HCT: 25.4 % — ABNORMAL LOW (ref 36.0–46.0)
Hemoglobin: 8.9 g/dL — ABNORMAL LOW (ref 12.0–15.0)
MCH: 28.5 pg (ref 26.0–34.0)
MCHC: 35 g/dL (ref 30.0–36.0)
MCV: 81.4 fL (ref 80.0–100.0)
Platelets: 56 10*3/uL — ABNORMAL LOW (ref 150–400)
RBC: 3.12 MIL/uL — ABNORMAL LOW (ref 3.87–5.11)
RDW: 16.7 % — ABNORMAL HIGH (ref 11.5–15.5)
WBC: 8.4 10*3/uL (ref 4.0–10.5)
nRBC: 0 % (ref 0.0–0.2)

## 2022-04-08 LAB — GLUCOSE, CAPILLARY
Glucose-Capillary: 151 mg/dL — ABNORMAL HIGH (ref 70–99)
Glucose-Capillary: 231 mg/dL — ABNORMAL HIGH (ref 70–99)
Glucose-Capillary: 147 mg/dL — ABNORMAL HIGH (ref 70–99)

## 2022-04-08 LAB — BASIC METABOLIC PANEL
Anion gap: 9 (ref 5–15)
BUN: 15 mg/dL (ref 8–23)
CO2: 20 mmol/L — ABNORMAL LOW (ref 22–32)
Calcium: 7.1 mg/dL — ABNORMAL LOW (ref 8.9–10.3)
Chloride: 104 mmol/L (ref 98–111)
Creatinine, Ser: 0.82 mg/dL (ref 0.44–1.00)
GFR, Estimated: >60 mL/min (ref >60)
Glucose, Bld: 172 mg/dL — ABNORMAL HIGH (ref 70–99)
Potassium: 3.3 mmol/L — ABNORMAL LOW (ref 3.5–5.1)
Sodium: 133 mmol/L — ABNORMAL LOW (ref 135–145)

## 2022-04-08 LAB — VITAMIN B6: Vitamin B6: 1.4 ug/L — ABNORMAL LOW (ref 3.4–65.2)

## 2022-04-08 LAB — AEROBIC/ANAEROBIC CULTURE W GRAM STAIN (SURGICAL/DEEP WOUND): Gram Stain: NONE SEEN

## 2022-04-08 LAB — MAGNESIUM: Magnesium: 1.4 mg/dL — ABNORMAL LOW (ref 1.7–2.4)

## 2022-04-08 MED ORDER — MAGNESIUM SULFATE 4 GM/100ML IV SOLN
4.0000 g | Freq: Once | INTRAVENOUS | Status: AC
Start: 2022-04-08 — End: 2022-04-08
  Administered 2022-04-08: 4 g via INTRAVENOUS
  Filled 2022-04-08: qty 100

## 2022-04-08 MED ORDER — SENNOSIDES-DOCUSATE SODIUM 8.6-50 MG PO TABS
2.0000 | ORAL_TABLET | Freq: Two times a day (BID) | ORAL | Status: DC
  Administered 2022-04-09 – 2022-04-14 (×10): 2 via ORAL
  Filled 2022-04-08 (×11): qty 2

## 2022-04-08 MED ORDER — POTASSIUM CHLORIDE 20 MEQ PO PACK
40.0000 meq | PACK | Freq: Every day | ORAL | Status: AC
  Administered 2022-04-08 – 2022-04-09 (×2): 40 meq via ORAL
  Filled 2022-04-08 (×2): qty 2

## 2022-04-09 ENCOUNTER — Encounter (HOSPITAL_COMMUNITY): Payer: Self-pay | Admitting: Orthopedic Surgery

## 2022-04-09 DIAGNOSIS — A400 Sepsis due to streptococcus, group A: Secondary | ICD-10-CM | POA: Diagnosis not present

## 2022-04-09 DIAGNOSIS — E871 Hypo-osmolality and hyponatremia: Secondary | ICD-10-CM | POA: Diagnosis not present

## 2022-04-09 DIAGNOSIS — B950 Streptococcus, group A, as the cause of diseases classified elsewhere: Secondary | ICD-10-CM

## 2022-04-09 DIAGNOSIS — R7881 Bacteremia: Secondary | ICD-10-CM | POA: Diagnosis not present

## 2022-04-09 DIAGNOSIS — B955 Unspecified streptococcus as the cause of diseases classified elsewhere: Secondary | ICD-10-CM | POA: Diagnosis not present

## 2022-04-09 DIAGNOSIS — M726 Necrotizing fasciitis: Secondary | ICD-10-CM | POA: Diagnosis not present

## 2022-04-09 DIAGNOSIS — E1165 Type 2 diabetes mellitus with hyperglycemia: Secondary | ICD-10-CM | POA: Diagnosis not present

## 2022-04-09 DIAGNOSIS — Z89612 Acquired absence of left leg above knee: Secondary | ICD-10-CM

## 2022-04-09 LAB — CBC
HCT: 23.5 % — ABNORMAL LOW (ref 36.0–46.0)
Hemoglobin: 8 g/dL — ABNORMAL LOW (ref 12.0–15.0)
MCH: 28.6 pg (ref 26.0–34.0)
MCHC: 34 g/dL (ref 30.0–36.0)
MCV: 83.9 fL (ref 80.0–100.0)
Platelets: 51 10*3/uL — ABNORMAL LOW (ref 150–400)
RBC: 2.8 MIL/uL — ABNORMAL LOW (ref 3.87–5.11)
RDW: 17 % — ABNORMAL HIGH (ref 11.5–15.5)
WBC: 9.2 10*3/uL (ref 4.0–10.5)
nRBC: 0.2 % (ref 0.0–0.2)

## 2022-04-09 LAB — CULTURE, BLOOD (ROUTINE X 2)
Culture: NO GROWTH
Culture: NO GROWTH
Special Requests: ADEQUATE
Special Requests: ADEQUATE

## 2022-04-09 LAB — BASIC METABOLIC PANEL
Anion gap: 8 (ref 5–15)
BUN: 17 mg/dL (ref 8–23)
CO2: 22 mmol/L (ref 22–32)
Calcium: 7.1 mg/dL — ABNORMAL LOW (ref 8.9–10.3)
Chloride: 102 mmol/L (ref 98–111)
Creatinine, Ser: 0.77 mg/dL (ref 0.44–1.00)
GFR, Estimated: >60 mL/min (ref >60)
Glucose, Bld: 128 mg/dL — ABNORMAL HIGH (ref 70–99)
Potassium: 3.1 mmol/L — ABNORMAL LOW (ref 3.5–5.1)
Sodium: 132 mmol/L — ABNORMAL LOW (ref 135–145)

## 2022-04-09 LAB — MAGNESIUM: Magnesium: 1.9 mg/dL (ref 1.7–2.4)

## 2022-04-09 LAB — AEROBIC/ANAEROBIC CULTURE W GRAM STAIN (SURGICAL/DEEP WOUND)
Gram Stain: NONE SEEN
Gram Stain: NONE SEEN

## 2022-04-09 LAB — GLUCOSE, CAPILLARY
Glucose-Capillary: 115 mg/dL — ABNORMAL HIGH (ref 70–99)
Glucose-Capillary: 210 mg/dL — ABNORMAL HIGH (ref 70–99)
Glucose-Capillary: 161 mg/dL — ABNORMAL HIGH (ref 70–99)
Glucose-Capillary: 106 mg/dL — ABNORMAL HIGH (ref 70–99)

## 2022-04-09 LAB — SURGICAL PATHOLOGY

## 2022-04-09 LAB — VITAMIN A: Vitamin A (Retinoic Acid): 6.2 ug/dL — ABNORMAL LOW (ref 22.0–69.5)

## 2022-04-09 LAB — PATHOLOGIST SMEAR REVIEW

## 2022-04-09 MED ORDER — VITAMIN B-6 50 MG PO TABS
50.0000 mg | ORAL_TABLET | Freq: Every day | ORAL | Status: DC
Start: 2022-04-09 — End: 2022-04-14
  Administered 2022-04-12 – 2022-04-13 (×2): 50 mg via ORAL
  Filled 2022-04-09 (×6): qty 1

## 2022-04-09 MED ORDER — DORZOLAMIDE HCL-TIMOLOL MAL 2-0.5 % OP SOLN
1.0000 [drp] | Freq: Two times a day (BID) | OPHTHALMIC | Status: DC
  Administered 2022-04-09 – 2022-04-13 (×8): 1 [drp] via OPHTHALMIC
  Filled 2022-04-09: qty 10

## 2022-04-09 MED ORDER — VITAMIN A 3 MG (10000 UNIT) PO CAPS
10000.0000 [IU] | ORAL_CAPSULE | Freq: Every day | ORAL | Status: DC
Start: 2022-04-09 — End: 2022-04-14
  Administered 2022-04-12 – 2022-04-13 (×2): 10000 [IU] via ORAL
  Filled 2022-04-09 (×6): qty 1

## 2022-04-09 NOTE — Progress Notes (Signed)
Nutrition Brief Note  Vitamin/Mineral Profile reviewed: Vitamin A 6.2 (L) Vitamin B6 1.4 (L) Zinc 34 (L) Thiamine 209.9 (H) Vitamin B12 4,868 (H) Folate 13.3 (WNL)  Discussed with attending physicians. Patient is receiving zinc supplementation. Will add supplementation: Vitamin A 10,000 units daily x 2 months  Pyridoxine 50 mg/d x 2 weeks  Recommend re-check vitamin A in 2 months and vitamin B6 (pyridoxine) in 2 weeks.   Gabriel Rainwater RD, LDN, CNSC Please refer to Amion for contact information.

## 2022-04-10 DIAGNOSIS — B955 Unspecified streptococcus as the cause of diseases classified elsewhere: Secondary | ICD-10-CM | POA: Diagnosis not present

## 2022-04-10 DIAGNOSIS — E1165 Type 2 diabetes mellitus with hyperglycemia: Secondary | ICD-10-CM | POA: Diagnosis not present

## 2022-04-10 DIAGNOSIS — M726 Necrotizing fasciitis: Secondary | ICD-10-CM | POA: Diagnosis not present

## 2022-04-10 DIAGNOSIS — R7881 Bacteremia: Secondary | ICD-10-CM | POA: Diagnosis not present

## 2022-04-10 DIAGNOSIS — E876 Hypokalemia: Secondary | ICD-10-CM

## 2022-04-10 LAB — BASIC METABOLIC PANEL
Anion gap: 8 (ref 5–15)
BUN: 16 mg/dL (ref 8–23)
CO2: 25 mmol/L (ref 22–32)
Calcium: 7.4 mg/dL — ABNORMAL LOW (ref 8.9–10.3)
Chloride: 102 mmol/L (ref 98–111)
Creatinine, Ser: 0.68 mg/dL (ref 0.44–1.00)
GFR, Estimated: >60 mL/min (ref >60)
Glucose, Bld: 86 mg/dL (ref 70–99)
Potassium: 3.4 mmol/L — ABNORMAL LOW (ref 3.5–5.1)
Sodium: 135 mmol/L (ref 135–145)

## 2022-04-10 LAB — CBC
HCT: 24.8 % — ABNORMAL LOW (ref 36.0–46.0)
Hemoglobin: 8.6 g/dL — ABNORMAL LOW (ref 12.0–15.0)
MCH: 28.9 pg (ref 26.0–34.0)
MCHC: 34.7 g/dL (ref 30.0–36.0)
MCV: 83.2 fL (ref 80.0–100.0)
Platelets: 69 10*3/uL — ABNORMAL LOW (ref 150–400)
RBC: 2.98 MIL/uL — ABNORMAL LOW (ref 3.87–5.11)
RDW: 16.8 % — ABNORMAL HIGH (ref 11.5–15.5)
WBC: 7.7 10*3/uL (ref 4.0–10.5)
nRBC: 0.3 % — ABNORMAL HIGH (ref 0.0–0.2)

## 2022-04-10 LAB — GLUCOSE, CAPILLARY
Glucose-Capillary: 85 mg/dL (ref 70–99)
Glucose-Capillary: 107 mg/dL — ABNORMAL HIGH (ref 70–99)
Glucose-Capillary: 143 mg/dL — ABNORMAL HIGH (ref 70–99)
Glucose-Capillary: 89 mg/dL (ref 70–99)

## 2022-04-10 LAB — VITAMIN C: Vitamin C: 0.4 mg/dL (ref 0.4–2.0)

## 2022-04-10 MED ORDER — INSULIN ASPART 100 UNIT/ML IJ SOLN
0.0000 [IU] | Freq: Three times a day (TID) | INTRAMUSCULAR | Status: DC
  Administered 2022-04-10: 1 [IU] via SUBCUTANEOUS
  Administered 2022-04-12: 2 [IU] via SUBCUTANEOUS
  Administered 2022-04-12: 3 [IU] via SUBCUTANEOUS
  Administered 2022-04-12: 1 [IU] via SUBCUTANEOUS
  Administered 2022-04-13 – 2022-04-15 (×6): 2 [IU] via SUBCUTANEOUS
  Administered 2022-04-15: 1 [IU] via SUBCUTANEOUS
  Administered 2022-04-16: 5 [IU] via SUBCUTANEOUS
  Administered 2022-04-16: 2 [IU] via SUBCUTANEOUS

## 2022-04-10 MED ORDER — FLUCONAZOLE 100 MG PO TABS
100.0000 mg | ORAL_TABLET | Freq: Every day | ORAL | Status: DC
Start: 2022-04-10 — End: 2022-04-16
  Administered 2022-04-10 – 2022-04-16 (×6): 100 mg via ORAL
  Filled 2022-04-10 (×7): qty 1

## 2022-04-10 MED ORDER — HYDROXYZINE HCL 25 MG PO TABS
25.0000 mg | ORAL_TABLET | Freq: Three times a day (TID) | ORAL | Status: DC | PRN
  Administered 2022-04-11: 25 mg via ORAL
  Filled 2022-04-10: qty 1

## 2022-04-10 MED ORDER — POTASSIUM CHLORIDE CRYS ER 20 MEQ PO TBCR
20.0000 meq | EXTENDED_RELEASE_TABLET | Freq: Once | ORAL | Status: AC
  Administered 2022-04-10: 20 meq via ORAL
  Filled 2022-04-10: qty 1

## 2022-04-11 ENCOUNTER — Other Ambulatory Visit: Payer: Self-pay

## 2022-04-11 ENCOUNTER — Encounter (HOSPITAL_COMMUNITY)
Admission: EM | Disposition: A | Discharge status: Skilled Nursing Facility | Payer: Self-pay | Source: Home / Self Care | Attending: Internal Medicine

## 2022-04-11 ENCOUNTER — Encounter (HOSPITAL_COMMUNITY): Payer: Self-pay | Admitting: Internal Medicine

## 2022-04-11 ENCOUNTER — Inpatient Hospital Stay (HOSPITAL_COMMUNITY): Payer: Medicare Other | Admitting: Anesthesiology

## 2022-04-11 DIAGNOSIS — M726 Necrotizing fasciitis: Secondary | ICD-10-CM

## 2022-04-11 DIAGNOSIS — R7881 Bacteremia: Secondary | ICD-10-CM | POA: Diagnosis not present

## 2022-04-11 DIAGNOSIS — B955 Unspecified streptococcus as the cause of diseases classified elsewhere: Secondary | ICD-10-CM | POA: Diagnosis not present

## 2022-04-11 DIAGNOSIS — I110 Hypertensive heart disease with heart failure: Secondary | ICD-10-CM

## 2022-04-11 DIAGNOSIS — E119 Type 2 diabetes mellitus without complications: Secondary | ICD-10-CM

## 2022-04-11 DIAGNOSIS — I509 Heart failure, unspecified: Secondary | ICD-10-CM

## 2022-04-11 DIAGNOSIS — E1165 Type 2 diabetes mellitus with hyperglycemia: Secondary | ICD-10-CM | POA: Diagnosis not present

## 2022-04-11 HISTORY — PX: STUMP REVISION: SHX6102

## 2022-04-11 LAB — CBC
HCT: 29 % — ABNORMAL LOW (ref 36.0–46.0)
Hemoglobin: 10 g/dL — ABNORMAL LOW (ref 12.0–15.0)
MCH: 28.9 pg (ref 26.0–34.0)
MCHC: 34.5 g/dL (ref 30.0–36.0)
MCV: 83.8 fL (ref 80.0–100.0)
Platelets: 77 10*3/uL — ABNORMAL LOW (ref 150–400)
RBC: 3.46 MIL/uL — ABNORMAL LOW (ref 3.87–5.11)
RDW: 16.8 % — ABNORMAL HIGH (ref 11.5–15.5)
WBC: 7.7 10*3/uL (ref 4.0–10.5)
nRBC: 0 % (ref 0.0–0.2)

## 2022-04-11 LAB — BASIC METABOLIC PANEL
Anion gap: 12 (ref 5–15)
BUN: 14 mg/dL (ref 8–23)
CO2: 26 mmol/L (ref 22–32)
Calcium: 7.9 mg/dL — ABNORMAL LOW (ref 8.9–10.3)
Chloride: 99 mmol/L (ref 98–111)
Creatinine, Ser: 0.69 mg/dL (ref 0.44–1.00)
GFR, Estimated: >60 mL/min (ref >60)
Glucose, Bld: 101 mg/dL — ABNORMAL HIGH (ref 70–99)
Potassium: 3.3 mmol/L — ABNORMAL LOW (ref 3.5–5.1)
Sodium: 137 mmol/L (ref 135–145)

## 2022-04-11 LAB — GLUCOSE, CAPILLARY
Glucose-Capillary: 114 mg/dL — ABNORMAL HIGH (ref 70–99)
Glucose-Capillary: 116 mg/dL — ABNORMAL HIGH (ref 70–99)
Glucose-Capillary: 106 mg/dL — ABNORMAL HIGH (ref 70–99)
Glucose-Capillary: 117 mg/dL — ABNORMAL HIGH (ref 70–99)
Glucose-Capillary: 121 mg/dL — ABNORMAL HIGH (ref 70–99)

## 2022-04-11 LAB — MAGNESIUM: Magnesium: 1.7 mg/dL (ref 1.7–2.4)

## 2022-04-11 SURGERY — REVISION, AMPUTATION SITE
Anesthesia: General | Site: Leg Upper | Laterality: Left

## 2022-04-11 MED ORDER — ZINC SULFATE 220 (50 ZN) MG PO CAPS: 220.0000 mg | ORAL_CAPSULE | Freq: Every day | ORAL | Status: DC

## 2022-04-11 MED ORDER — ONDANSETRON HCL 4 MG/2ML IJ SOLN
INTRAMUSCULAR | Status: AC
  Filled 2022-04-11: qty 2

## 2022-04-11 MED ORDER — ENOXAPARIN SODIUM 40 MG/0.4ML IJ SOSY
40.0000 mg | PREFILLED_SYRINGE | INTRAMUSCULAR | Status: DC
  Administered 2022-04-11 – 2022-04-15 (×5): 40 mg via SUBCUTANEOUS
  Filled 2022-04-11 (×5): qty 0.4

## 2022-04-11 MED ORDER — FENTANYL CITRATE (PF) 250 MCG/5ML IJ SOLN
INTRAMUSCULAR | Status: AC
  Filled 2022-04-11: qty 5

## 2022-04-11 MED ORDER — MIDAZOLAM HCL 2 MG/2ML IJ SOLN
INTRAMUSCULAR | Status: DC | PRN
  Administered 2022-04-11: 1 mg via INTRAVENOUS

## 2022-04-11 MED ORDER — LIDOCAINE 2% (20 MG/ML) 5 ML SYRINGE
INTRAMUSCULAR | Status: DC | PRN
  Administered 2022-04-11: 40 mg via INTRAVENOUS

## 2022-04-11 MED ORDER — PROPOFOL 10 MG/ML IV BOLUS
INTRAVENOUS | Status: DC | PRN
  Administered 2022-04-11: 100 mg via INTRAVENOUS

## 2022-04-11 MED ORDER — ONDANSETRON HCL 4 MG/2ML IJ SOLN
INTRAMUSCULAR | Status: DC | PRN
  Administered 2022-04-11: 4 mg via INTRAVENOUS

## 2022-04-11 MED ORDER — ONDANSETRON HCL 4 MG/2ML IJ SOLN: 4.0000 mg | Freq: Four times a day (QID) | INTRAMUSCULAR | Status: DC | PRN

## 2022-04-11 MED ORDER — OXYCODONE HCL 5 MG PO TABS
5.0000 mg | ORAL_TABLET | Freq: Once | ORAL | Status: AC
  Administered 2022-04-11: 5 mg via ORAL
  Filled 2022-04-11: qty 1

## 2022-04-11 MED ORDER — SODIUM CHLORIDE 0.9 % IV SOLN: INTRAVENOUS | Status: DC

## 2022-04-11 MED ORDER — PHENYLEPHRINE 80 MCG/ML (10ML) SYRINGE FOR IV PUSH (FOR BLOOD PRESSURE SUPPORT)
PREFILLED_SYRINGE | INTRAVENOUS | Status: DC | PRN
  Administered 2022-04-11: 80 ug via INTRAVENOUS

## 2022-04-11 MED ORDER — POTASSIUM CHLORIDE CRYS ER 20 MEQ PO TBCR: 20.0000 meq | EXTENDED_RELEASE_TABLET | Freq: Every day | ORAL | Status: DC | PRN

## 2022-04-11 MED ORDER — PANTOPRAZOLE SODIUM 40 MG PO TBEC: 40.0000 mg | DELAYED_RELEASE_TABLET | Freq: Every day | ORAL | Status: DC

## 2022-04-11 MED ORDER — POTASSIUM CHLORIDE CRYS ER 20 MEQ PO TBCR
40.0000 meq | EXTENDED_RELEASE_TABLET | Freq: Once | ORAL | Status: AC
Start: 2022-04-11 — End: 2022-04-11
  Administered 2022-04-11: 40 meq via ORAL
  Filled 2022-04-11 (×2): qty 2

## 2022-04-11 MED ORDER — PROPOFOL 10 MG/ML IV BOLUS
INTRAVENOUS | Status: AC
  Filled 2022-04-11: qty 20

## 2022-04-11 MED ORDER — MAGNESIUM SULFATE 2 GM/50ML IV SOLN: 2.0000 g | Freq: Every day | INTRAVENOUS | Status: DC | PRN

## 2022-04-11 MED ORDER — MIDAZOLAM HCL 2 MG/2ML IJ SOLN
INTRAMUSCULAR | Status: AC
  Filled 2022-04-11: qty 2

## 2022-04-11 MED ORDER — PHENYLEPHRINE HCL-NACL 20-0.9 MG/250ML-% IV SOLN
INTRAVENOUS | Status: DC | PRN
  Administered 2022-04-11: 50 ug/min via INTRAVENOUS

## 2022-04-11 MED ORDER — FENTANYL CITRATE (PF) 100 MCG/2ML IJ SOLN
25.0000 ug | INTRAMUSCULAR | Status: DC | PRN
  Administered 2022-04-11: 50 ug via INTRAVENOUS

## 2022-04-11 MED ORDER — POLYETHYLENE GLYCOL 3350 17 G PO PACK
17.0000 g | PACK | Freq: Every day | ORAL | Status: DC | PRN
Start: 2022-04-11 — End: 2022-04-11

## 2022-04-11 MED ORDER — TRANEXAMIC ACID-NACL 1000-0.7 MG/100ML-% IV SOLN
1000.0000 mg | INTRAVENOUS | Status: AC
  Administered 2022-04-11: 1000 mg via INTRAVENOUS
  Filled 2022-04-11: qty 100

## 2022-04-11 MED ORDER — CHLORHEXIDINE GLUCONATE 0.12 % MT SOLN: 15.0000 mL | Freq: Once | OROMUCOSAL | Status: AC

## 2022-04-11 MED ORDER — 0.9 % SODIUM CHLORIDE (POUR BTL) OPTIME
TOPICAL | Status: DC | PRN
  Administered 2022-04-11: 1000 mL

## 2022-04-11 MED ORDER — LACTATED RINGERS IV SOLN: INTRAVENOUS | Status: DC

## 2022-04-11 MED ORDER — OXYCODONE HCL 5 MG PO TABS: 5.0000 mg | ORAL_TABLET | Freq: Once | ORAL | Status: DC | PRN

## 2022-04-11 MED ORDER — CHLORHEXIDINE GLUCONATE 4 % EX LIQD: CUTANEOUS | Status: AC

## 2022-04-11 MED ORDER — FENTANYL CITRATE (PF) 100 MCG/2ML IJ SOLN
INTRAMUSCULAR | Status: AC
  Filled 2022-04-11: qty 2

## 2022-04-11 MED ORDER — ORAL CARE MOUTH RINSE: 15.0000 mL | Freq: Once | OROMUCOSAL | Status: AC

## 2022-04-11 MED ORDER — ALUM & MAG HYDROXIDE-SIMETH 200-200-20 MG/5ML PO SUSP: 15.0000 mL | ORAL | Status: DC | PRN

## 2022-04-11 MED ORDER — CEFAZOLIN SODIUM-DEXTROSE 2-4 GM/100ML-% IV SOLN
2.0000 g | INTRAVENOUS | Status: AC
  Administered 2022-04-11: 2 g via INTRAVENOUS
  Filled 2022-04-11: qty 100

## 2022-04-11 MED ORDER — PHENOL 1.4 % MT LIQD: 1.0000 | OROMUCOSAL | Status: DC | PRN

## 2022-04-11 MED ORDER — FENTANYL CITRATE (PF) 100 MCG/2ML IJ SOLN
INTRAMUSCULAR | Status: DC | PRN
  Administered 2022-04-11: 50 ug via INTRAVENOUS
  Administered 2022-04-11: 25 ug via INTRAVENOUS

## 2022-04-11 MED ORDER — CEFAZOLIN SODIUM-DEXTROSE 2-4 GM/100ML-% IV SOLN
2.0000 g | Freq: Three times a day (TID) | INTRAVENOUS | Status: DC
  Administered 2022-04-11 – 2022-04-12 (×3): 2 g via INTRAVENOUS
  Filled 2022-04-11 (×3): qty 100

## 2022-04-11 MED ORDER — LABETALOL HCL 5 MG/ML IV SOLN: 10.0000 mg | INTRAVENOUS | Status: DC | PRN

## 2022-04-11 MED ORDER — CEFAZOLIN SODIUM-DEXTROSE 2-4 GM/100ML-% IV SOLN: 2.0000 g | Freq: Three times a day (TID) | INTRAVENOUS | Status: DC

## 2022-04-11 MED ORDER — ONDANSETRON HCL 4 MG/2ML IJ SOLN
4.0000 mg | Freq: Once | INTRAMUSCULAR | Status: DC | PRN
Start: 2022-04-11 — End: 2022-04-11

## 2022-04-11 MED ORDER — ACETAMINOPHEN 500 MG PO TABS: 1000.0000 mg | ORAL_TABLET | Freq: Once | ORAL | Status: DC

## 2022-04-11 MED ORDER — POVIDONE-IODINE 10 % EX SWAB
2.0000 | Freq: Once | CUTANEOUS | Status: AC
  Administered 2022-04-11: 2 via TOPICAL

## 2022-04-11 MED ORDER — METOPROLOL TARTRATE 5 MG/5ML IV SOLN: 2.0000 mg | INTRAVENOUS | Status: DC | PRN

## 2022-04-11 MED ORDER — CHLORHEXIDINE GLUCONATE 0.12 % MT SOLN
OROMUCOSAL | Status: AC
  Administered 2022-04-11: 15 mL via OROMUCOSAL
  Filled 2022-04-11: qty 15

## 2022-04-11 MED ORDER — ASCORBIC ACID 500 MG PO TABS: 1000.0000 mg | ORAL_TABLET | Freq: Every day | ORAL | Status: DC

## 2022-04-11 MED ORDER — MAGNESIUM CITRATE PO SOLN
1.0000 | Freq: Once | ORAL | Status: DC | PRN
  Filled 2022-04-11: qty 296

## 2022-04-11 MED ORDER — DOCUSATE SODIUM 100 MG PO CAPS: 100.0000 mg | ORAL_CAPSULE | Freq: Every day | ORAL | Status: DC

## 2022-04-11 MED ORDER — OXYCODONE HCL 5 MG/5ML PO SOLN: 5.0000 mg | Freq: Once | ORAL | Status: DC | PRN

## 2022-04-11 MED ORDER — CHLORHEXIDINE GLUCONATE 4 % EX LIQD
60.0000 mL | Freq: Once | CUTANEOUS | Status: AC
  Administered 2022-04-11: 4 via TOPICAL
  Filled 2022-04-11: qty 60

## 2022-04-11 MED ORDER — GUAIFENESIN-DM 100-10 MG/5ML PO SYRP: 15.0000 mL | ORAL_SOLUTION | ORAL | Status: DC | PRN

## 2022-04-11 MED ORDER — BISACODYL 5 MG PO TBEC: 5.0000 mg | DELAYED_RELEASE_TABLET | Freq: Every day | ORAL | Status: DC | PRN

## 2022-04-11 MED ORDER — HYDRALAZINE HCL 20 MG/ML IJ SOLN: 5.0000 mg | INTRAMUSCULAR | Status: DC | PRN

## 2022-04-11 MED ORDER — JUVEN PO PACK: 1.0000 | PACK | Freq: Two times a day (BID) | ORAL | Status: DC

## 2022-04-11 SURGICAL SUPPLY — 37 items
BAG COUNTER SPONGE SURGICOUNT (BAG) ×2 IMPLANT
BLADE SURG 21 STRL SS (BLADE) ×2 IMPLANT
CANISTER WOUND CARE 500ML ATS (WOUND CARE) ×2 IMPLANT
COVER SURGICAL LIGHT HANDLE (MISCELLANEOUS) ×2 IMPLANT
DRAPE DERMATAC (DRAPES) ×2 IMPLANT
DRAPE EXTREMITY T 121X128X90 (DISPOSABLE) ×2 IMPLANT
DRAPE HALF SHEET 40X57 (DRAPES) ×2 IMPLANT
DRAPE INCISE IOBAN 66X45 STRL (DRAPES) ×2 IMPLANT
DRAPE U-SHAPE 47X51 STRL (DRAPES) ×4 IMPLANT
DRESSING PREVENA PLUS CUSTOM (GAUZE/BANDAGES/DRESSINGS) ×1 IMPLANT
DRSG PREVENA PLUS CUSTOM (GAUZE/BANDAGES/DRESSINGS) ×2
DURAPREP 26ML APPLICATOR (WOUND CARE) ×2 IMPLANT
ELECT REM PT RETURN 9FT ADLT (ELECTROSURGICAL) ×2
ELECTRODE REM PT RTRN 9FT ADLT (ELECTROSURGICAL) ×1 IMPLANT
GLOVE BIOGEL PI IND STRL 9 (GLOVE) ×1 IMPLANT
GLOVE BIOGEL PI INDICATOR 9 (GLOVE) ×1
GLOVE ECLIPSE 7.0 STRL STRAW (GLOVE) ×1 IMPLANT
GLOVE SS N UNI LF 7.0 STRL (GLOVE) ×1 IMPLANT
GLOVE SURG ORTHO 9.0 STRL STRW (GLOVE) ×2 IMPLANT
GOWN STRL REUS W/ TWL XL LVL3 (GOWN DISPOSABLE) ×2 IMPLANT
GOWN STRL REUS W/TWL XL LVL3 (GOWN DISPOSABLE) ×2
GRAFT SKIN WND MICRO 38 (Tissue) ×1 IMPLANT
KIT BASIN OR (CUSTOM PROCEDURE TRAY) ×2 IMPLANT
KIT TURNOVER KIT B (KITS) ×2 IMPLANT
MANIFOLD NEPTUNE II (INSTRUMENTS) ×2 IMPLANT
NS IRRIG 1000ML POUR BTL (IV SOLUTION) ×2 IMPLANT
PACK GENERAL/GYN (CUSTOM PROCEDURE TRAY) ×2 IMPLANT
PAD ARMBOARD 7.5X6 YLW CONV (MISCELLANEOUS) ×2 IMPLANT
PREVENA RESTOR ARTHOFORM 46X30 (CANNISTER) ×2 IMPLANT
STAPLER VISISTAT 35W (STAPLE) IMPLANT
STOCKINETTE 6  STRL (DRAPES) ×1
STOCKINETTE 6 STRL (DRAPES) IMPLANT
SUT ETHILON 2 0 PSLX (SUTURE) ×5 IMPLANT
SUT SILK 2 0 (SUTURE)
SUT SILK 2-0 18XBRD TIE 12 (SUTURE) IMPLANT
SUT VIC AB 1 CTX 27 (SUTURE) ×2 IMPLANT
TOWEL GREEN STERILE (TOWEL DISPOSABLE) ×2 IMPLANT

## 2022-04-11 NOTE — Progress Notes (Signed)
Nutrition Follow-up  DOCUMENTATION CODES:   Not applicable  INTERVENTION:   Ensure Enlive po TID, each supplement provides 350 kcal and 20 grams of protein. MVI with minerals daily. Vitamin C 500 mg BID. Zinc sulfate 220 mg daily x 14 days (end date 7/7). Vitamin A 10,000 units daily x 2 months. Pyridoxine 50 mg/d x 2 weeks (end date 7/10).  NUTRITION DIAGNOSIS:   Increased nutrient needs related to wound healing as evidenced by estimated needs.  Ongoing  GOAL:   Patient will meet greater than or equal to 90% of their needs  Progressing   MONITOR:   PO intake, Supplement acceptance, Labs, Skin  REASON FOR ASSESSMENT:   Consult Assessment of nutrition requirement/status  ASSESSMENT:   71 yo female admitted with necrotizing fasciitis of LLE and strep pyogenes bacteremia. PMH includes DM-2.  6/21 - L AKA  6/23 - L AKA revision  Currently in the OR for another L AKA revision. RD unable to speak with patient or complete NFPE at this time.  Currently NPO. Previously on a carb modified diet consuming 0-75% of meals.  She is being offered Ensure supplements TID, she is accepting some and refusing some.   Labs reviewed. K 3.3 CBG: 114-116  Vitamin/Mineral Profile reviewed: Vitamin A 6.2 (L) Vitamin B6 1.4 (L) Zinc 34 (L) Thiamine 209.9 (H) Vitamin B12 4,868 (H) Folate 13.3 (WNL) Vitamin C 0.4 (low end of normal)  Medications reviewed and include vitamin C, Colace, Novolog, MVI with minerals, Juven, Miralax, pyridoxine, Senokot-S, vitamin A, zinc. IVF: LR at 10 ml/h  NUTRITION - FOCUSED PHYSICAL EXAM:  Unable to complete  Diet Order:   Diet Order             Diet NPO time specified  Diet effective midnight                   EDUCATION NEEDS:   Not appropriate for education at this time  Skin:  Skin Assessment: Skin Integrity Issues: Skin Integrity Issues:: Other (Comment) Wound Vac: - Other: L AKA  Last BM:  6/25 type 6  Height:   Ht  Readings from Last 1 Encounters:  04/11/22 5\' 5"  (1.651 m)    Weight:   Wt Readings from Last 1 Encounters:  04/11/22 75.3 kg    BMI:  Body mass index is 27.62 kg/m.  Estimated Nutritional Needs:   Kcal:  2000-2300  Protein:  130-150 gm  Fluid:  >/= 2 L    04/13/22 RD, LDN, CNSC Please refer to Amion for contact information.

## 2022-04-11 NOTE — Transfer of Care (Signed)
Immediate Anesthesia Transfer of Care Note  Patient: Jill Munoz  Procedure(s) Performed: REVISION LEFT ABOVE KNEE AMPUTATION (Left: Leg Upper)  Patient Location: PACU  Anesthesia Type:General  Level of Consciousness: awake, alert  and oriented  Airway & Oxygen Therapy: Patient Spontanous Breathing, Patient connected to face mask oxygen and oral airway  Post-op Assessment: Report given to RN and Post -op Vital signs reviewed and stable  Post vital signs: Reviewed and stable  Last Vitals:  Vitals Value Taken Time  BP 81/51 04/11/22 1047  Temp    Pulse 62 04/11/22 1051  Resp 18 04/11/22 1051  SpO2 100 % 04/11/22 1051  Vitals shown include unvalidated device data.  Last Pain:  Vitals:   04/11/22 0913  TempSrc: Oral  PainSc:       Patients Stated Pain Goal: 0 (04/09/22 0207)  Complications: No notable events documented.

## 2022-04-11 NOTE — Progress Notes (Deleted)
Patient requesting clarification on thoracentesis procedure today. RN paged heart failure PA and they stated cardiothoracic was placing order. RN paged Cardiothoracic PA who placed order for right sided thoracentesis. RN called IR and they indicated it wouldn't happen until tomorrow 6/29. RN spoke with nephrologist to ensure dialysis and thoracentesis were okay to be scheduled the same day. RN updated patient and family with plan.  Jill Munoz C Bentleigh Waren  

## 2022-04-11 NOTE — Progress Notes (Signed)
Inpatient Rehab Admissions Coordinator:   CIR consult received per ortho protocol. Prior to AKA procedure, PT/OT were recommending SNF due to inability to tolerate the intensity of CIR. Suspect she will continue to require a lower intensity rehab than CIR, but will follow for post-op recommendations from PT/OT.  Megan Salon, MS, CCC-SLP Rehab Admissions Coordinator  [phone] (celll) [phone] (office)

## 2022-04-11 NOTE — Anesthesia Procedure Notes (Signed)
Procedure Name: LMA Insertion Date/Time: 04/11/2022 9:55 AM  Performed by: Jodell Cipro, CRNAPre-anesthesia Checklist: Patient identified, Emergency Drugs available, Suction available and Patient being monitored Patient Re-evaluated:Patient Re-evaluated prior to induction Oxygen Delivery Method: Circle System Utilized Preoxygenation: Pre-oxygenation with 100% oxygen Induction Type: IV induction Ventilation: Mask ventilation without difficulty LMA: LMA inserted LMA Size: 4.0 Number of attempts: 2 Placement Confirmation: positive ETCO2 Tube secured with: Tape Dental Injury: Teeth and Oropharynx as per pre-operative assessment

## 2022-04-11 NOTE — Plan of Care (Signed)
Problem: Education: Goal: Knowledge of General Education information will improve Description: Including pain rating scale, medication(s)/side effects and non-pharmacologic comfort measures Outcome: Progressing   Problem: Health Behavior/Discharge Planning: Goal: Ability to manage health-related needs will improve Outcome: Progressing   Problem: Clinical Measurements: Goal: Ability to maintain clinical measurements within normal limits will improve Outcome: Progressing Goal: Will remain free from infection Outcome: Progressing Goal: Diagnostic test results will improve Outcome: Progressing Goal: Respiratory complications will improve Outcome: Progressing Goal: Cardiovascular complication will be avoided Outcome: Progressing   Problem: Activity: Goal: Risk for activity intolerance will decrease Outcome: Progressing   Problem: Nutrition: Goal: Adequate nutrition will be maintained Outcome: Progressing   Problem: Coping: Goal: Level of anxiety will decrease Outcome: Progressing   Problem: Elimination: Goal: Will not experience complications related to bowel motility Outcome: Progressing Goal: Will not experience complications related to urinary retention Outcome: Progressing   Problem: Pain Managment: Goal: General experience of comfort will improve Outcome: Progressing   Problem: Safety: Goal: Ability to remain free from injury will improve Outcome: Progressing   Problem: Skin Integrity: Goal: Risk for impaired skin integrity will decrease Outcome: Progressing   Problem: Education: Goal: Knowledge of the prescribed therapeutic regimen will improve Outcome: Progressing Goal: Ability to verbalize activity precautions or restrictions will improve Outcome: Progressing Goal: Understanding of discharge needs will improve Outcome: Progressing   Problem: Activity: Goal: Ability to perform//tolerate increased activity and mobilize with assistive devices will  improve Outcome: Progressing   Problem: Clinical Measurements: Goal: Postoperative complications will be avoided or minimized Outcome: Progressing   Problem: Self-Care: Goal: Ability to meet self-care needs will improve Outcome: Progressing   Problem: Self-Concept: Goal: Ability to maintain and perform role responsibilities to the fullest extent possible will improve Outcome: Progressing   Problem: Pain Management: Goal: Pain level will decrease with appropriate interventions Outcome: Progressing   Problem: Skin Integrity: Goal: Demonstration of wound healing without infection will improve Outcome: Progressing   Problem: Education: Goal: Ability to describe self-care measures that may prevent or decrease complications (Diabetes Survival Skills Education) will improve Outcome: Progressing Goal: Individualized Educational Video(s) Outcome: Progressing   Problem: Coping: Goal: Ability to adjust to condition or change in health will improve Outcome: Progressing   Problem: Fluid Volume: Goal: Ability to maintain a balanced intake and output will improve Outcome: Progressing   Problem: Health Behavior/Discharge Planning: Goal: Ability to identify and utilize available resources and services will improve Outcome: Progressing Goal: Ability to manage health-related needs will improve Outcome: Progressing   Problem: Metabolic: Goal: Ability to maintain appropriate glucose levels will improve Outcome: Progressing   Problem: Nutritional: Goal: Maintenance of adequate nutrition will improve Outcome: Progressing Goal: Progress toward achieving an optimal weight will improve Outcome: Progressing   Problem: Skin Integrity: Goal: Risk for impaired skin integrity will decrease Outcome: Progressing   Problem: Tissue Perfusion: Goal: Adequacy of tissue perfusion will improve Outcome: Progressing   Problem: Education: Goal: Knowledge of General Education information will  improve Description: Including pain rating scale, medication(s)/side effects and non-pharmacologic comfort measures Outcome: Progressing   Problem: Health Behavior/Discharge Planning: Goal: Ability to manage health-related needs will improve Outcome: Progressing   Problem: Clinical Measurements: Goal: Ability to maintain clinical measurements within normal limits will improve Outcome: Progressing Goal: Will remain free from infection Outcome: Progressing Goal: Diagnostic test results will improve Outcome: Progressing Goal: Respiratory complications will improve Outcome: Progressing Goal: Cardiovascular complication will be avoided Outcome: Progressing   Problem: Activity: Goal: Risk for activity intolerance will decrease Outcome: Progressing  Problem: Nutrition: Goal: Adequate nutrition will be maintained Outcome: Progressing   Problem: Coping: Goal: Level of anxiety will decrease Outcome: Progressing   Problem: Elimination: Goal: Will not experience complications related to bowel motility Outcome: Progressing Goal: Will not experience complications related to urinary retention Outcome: Progressing   Problem: Pain Managment: Goal: General experience of comfort will improve Outcome: Progressing   Problem: Safety: Goal: Ability to remain free from injury will improve Outcome: Progressing   Problem: Skin Integrity: Goal: Risk for impaired skin integrity will decrease Outcome: Progressing   Problem: Education: Goal: Knowledge of the prescribed therapeutic regimen will improve Outcome: Progressing Goal: Ability to verbalize activity precautions or restrictions will improve Outcome: Progressing Goal: Understanding of discharge needs will improve Outcome: Progressing   Problem: Activity: Goal: Ability to perform//tolerate increased activity and mobilize with assistive devices will improve Outcome: Progressing   Problem: Clinical Measurements: Goal:  Postoperative complications will be avoided or minimized Outcome: Progressing   Problem: Self-Care: Goal: Ability to meet self-care needs will improve Outcome: Progressing   Problem: Self-Concept: Goal: Ability to maintain and perform role responsibilities to the fullest extent possible will improve Outcome: Progressing   Problem: Pain Management: Goal: Pain level will decrease with appropriate interventions Outcome: Progressing   Problem: Skin Integrity: Goal: Demonstration of wound healing without infection will improve Outcome: Progressing   Problem: Education: Goal: Ability to describe self-care measures that may prevent or decrease complications (Diabetes Survival Skills Education) will improve Outcome: Progressing Goal: Individualized Educational Video(s) Outcome: Progressing   Problem: Coping: Goal: Ability to adjust to condition or change in health will improve Outcome: Progressing   Problem: Fluid Volume: Goal: Ability to maintain a balanced intake and output will improve Outcome: Progressing   Problem: Health Behavior/Discharge Planning: Goal: Ability to identify and utilize available resources and services will improve Outcome: Progressing Goal: Ability to manage health-related needs will improve Outcome: Progressing   Problem: Metabolic: Goal: Ability to maintain appropriate glucose levels will improve Outcome: Progressing   Problem: Nutritional: Goal: Maintenance of adequate nutrition will improve Outcome: Progressing Goal: Progress toward achieving an optimal weight will improve Outcome: Progressing   Problem: Skin Integrity: Goal: Risk for impaired skin integrity will decrease Outcome: Progressing   Problem: Tissue Perfusion: Goal: Adequacy of tissue perfusion will improve Outcome: Progressing

## 2022-04-11 NOTE — Progress Notes (Signed)
Patient returned to 4E from the PACU. Vitals taken and stable. Leg and wound vac assessed. Patient oriented to room and staff. Husband at the bedside.  Jill Munoz

## 2022-04-11 NOTE — Interval H&P Note (Signed)
History and Physical Interval Note:  04/11/2022 6:49 AM  Jill Munoz  has presented today for surgery, with the diagnosis of Necrotizing Fasciitis.  The various methods of treatment have been discussed with the patient and family. After consideration of risks, benefits and other options for treatment, the patient has consented to  Procedure(s): REVISION LEFT ABOVE KNEE AMPUTATION (Left) as a surgical intervention.  The patient's history has been reviewed, patient examined, no change in status, stable for surgery.  I have reviewed the patient's chart and labs.  Questions were answered to the patient's satisfaction.     [name]

## 2022-04-11 NOTE — Progress Notes (Addendum)
HD#9 Subjective:  Overnight Events: requested some medication for anxiety, received atarax  Patient seen and assessed at bedside.  Husband at bedside.  Patient denies any acute complaints today.  Mildly anxious about surgery.  Patient feels that she did well with physical therapy.  Spouse and patient are appreciative of care.  Objective:  Vital signs in last 24 hours: Vitals:   04/11/22 1145 04/11/22 1205 04/11/22 1215 04/11/22 1230  BP: 128/66 138/74 (!) 163/70 (!) 157/76  Pulse: 79 73 82 81  Resp: 18 16    Temp:  (!) 97.5 F (36.4 C)    TempSrc:  Oral    SpO2: 99% 100% 100% 100%  Weight:      Height:       Supplemental O2: Room Air   Physical Exam:  Physical Exam Constitutional:      General: She is not in acute distress. HENT:     Head: Normocephalic.  Eyes:     General:        Right eye: No discharge.        Left eye: No discharge.     Conjunctiva/sclera: Conjunctivae normal.  Cardiovascular:     Rate and Rhythm: Regular rhythm. Tachycardia present.  Pulmonary:     Effort: Pulmonary effort is normal. No respiratory distress.     Breath sounds: Normal breath sounds. No wheezing.     Comments: Crackles bilateral lung bases Abdominal:     General: Bowel sounds are normal. There is no distension.     Palpations: Abdomen is soft.  Musculoskeletal:     Right lower leg: Edema present.     Comments: Left wound VAC in place. serosanguinous drainage. Trace RLE pitting edema  Skin:    General: Skin is warm.  Psychiatric:        Mood and Affect: Mood normal.     Filed Weights   04/10/22 0412 04/11/22 0500 04/11/22 0913  Weight: 65.8 kg 75.3 kg 75.3 kg   Stable at 65.8 kg   Net IO Since Admission: -4,623.14 mL [04/11/22 1234]  Pertinent Labs:    Latest Ref Rng & Units 04/11/2022    1:01 AM 04/10/2022    1:38 AM 04/09/2022    2:32 AM  CBC  WBC 4.0 - 10.5 K/uL 7.7  7.7  9.2   Hemoglobin 12.0 - 15.0 g/dL 82.9  8.6  8.0   Hematocrit 36.0 - 46.0 % 29.0   24.8  23.5   Platelets 150 - 400 K/uL 77  69  51        Latest Ref Rng & Units 04/11/2022    1:01 AM 04/10/2022    1:38 AM 04/09/2022    2:32 AM  CMP  Glucose 70 - 99 mg/dL 562  86  130   BUN 8 - 23 mg/dL 14  16  17    Creatinine 0.44 - 1.00 mg/dL  8.65  7.84   Sodium 135 - 145 mmol/L 137  135  132   Potassium 3.5 - 5.1 mmol/L 3.3  3.4  3.1   Chloride 98 - 111 mmol/L 99  102  102   CO2 22 - 32 mmol/L 26  25  22    Calcium 8.9 - 10.3 mg/dL 7.9  7.4  7.1     Imaging: No results found.  Assessment/Plan:   Principal Problem:   Necrotizing fasciitis (HCC) Active Problems:   Hyponatremia   Hypotension   Bacteremia due to Streptococcus Pyogenes   Type 2 diabetes  mellitus with hyperglycemia (HCC)   Beta-hemolytic group A streptococcal sepsis (HCC)   Hypokalemia   Patient Summary: Jill Munoz is a 71 y.o. with no known PMHx, who presented with worsening left hemorrhagic bulla of left leg  and admitted for necrotizing fasciitis of left leg, S. Pyogenes bacteremia, hypotonic hypovolemic hyponatremia and uncontrolled T2DM.    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  Plan for wound closure and debridement today. -Orthopedic surgery following; appreciate their recommendations  -Minimize opioids to avoid altered mental status   #S. Pyogenes bacteremia White count trending down.  No evidence of endocarditis. Repeat Bcx 6/21 no growth to date -Appreciate ID recommendation -Continue cefazolin, will transition to cefadroxil tomorrow if doing well -May be able to avoid PICC line per ID  #Acute Constipation -Continue miralax/senna, will decrease frequency if continues to have diarrhea -KUB if having liquid stool and/or abdominal pain   #Thrombocytopenia Improving since discontinuing linezolid. Suspect bone marrow suppression due to malnutrition vs linezolid.  No schistocytes on peripheral smear.  Patient has not been on  heparin. -Continue to monitor.    #Hypotonic Hypovolemic Hyponatremia, resolved Multifactorial likely due to necrotizing fasciitis, bacteremia, poor PO intake. Sodium 135 this morning -Encourage p.o. intake  #Hypokalemia #Hypomagnesemia K 3.3 -Repleted K -AM BMP   # Neurogenic Bladder, resolved In the setting of postoperative. Yesterday bladder scan 0 cc after foley removal. Seem to be urinating well with purewick.  -D/C bladder scans   # Type 2 Diabetes Mellitus  A1c 11%.  Fasting CBG 85 this morning.  -Discontinue Semglee 5 u - SSI, switch to sensivtive and CBG qac and qhs   # Iron Deficiency Anemia Hgb 8.9>8.0. - Daily CBC - Transfuse for hemoglobin < 7 - Avoid IV iron in the setting of bacteremia   #HFrEF EF 40-45%, global hypokinesis with G1DD. Trace pitting edema of right leg. May need diuresis if shows sign of hypervolemia.  -Will need GDMT but will start these after she is more stable from other acute problems -Outpatient cardiology follow up   # Left Subdural Hygroma  Incidental finding on CT head -Monitor for worsening neurological status  #Zinc Deficiency #Vitamin A deficiency #Vitamin B6 Deficiency -Vitamin A 10,000 unit daily x2 months -Pyridoxine 50 mg qd  x 2 weeks -Recheck levels after completion of supplementation -Appreciate RD recs   Diet: carb IVF: none VTE: Lovenox Code: Full PT/OT recs: CIR vs SNF TOC recs: pending   Dispo: Anticipated discharge to Skilled nursing facility/AIR in 4 days pending debridement of LLE and management of bacteremia  Park Pope, MD 04/11/2022, 12:34 PM Pager: [phone]  Please contact the on call pager after 5 pm and on weekends at [phone].

## 2022-04-11 NOTE — Anesthesia Postprocedure Evaluation (Signed)
Anesthesia Post Note  Patient: Jill Munoz  Procedure(s) Performed: REVISION LEFT ABOVE KNEE AMPUTATION (Left: Leg Upper)     Patient location during evaluation: PACU Anesthesia Type: General Level of consciousness: awake and alert Pain management: pain level controlled Vital Signs Assessment: post-procedure vital signs reviewed and stable Respiratory status: spontaneous breathing, nonlabored ventilation, respiratory function stable and patient connected to nasal cannula oxygen Cardiovascular status: blood pressure returned to baseline and stable Postop Assessment: no apparent nausea or vomiting Anesthetic complications: no   No notable events documented.  Last Vitals:  Vitals:   04/11/22 1230 04/11/22 1248  BP: (!) 157/76 (!) 156/71  Pulse: 81 78  Resp:    Temp:    SpO2: 100% 100%                 Beryle Lathe

## 2022-04-11 NOTE — Progress Notes (Shared)
HD#9 Subjective:  Overnight Events: no event  Patient and husband was seen at bedside. *** Nervous about sgx. PT went well yesterday. Reports abd bloating. Has some appetite. Plan for sgx today.   Objective:  Vital signs in last 24 hours: Vitals:   04/10/22 2015 04/10/22 2356 04/11/22 0352 04/11/22 0500  BP: 136/63 (!) 168/77 (!) 165/75   Pulse: 66 81 87   Resp: 17 18 17    Temp: 97.7 F (36.5 C) 97.7 F (36.5 C) 97.6 F (36.4 C)   TempSrc: Oral Oral Oral   SpO2: 98% 98% 94%   Weight:    75.3 kg  Height:       Supplemental O2: Room Air   Physical Exam:  Physical Exam Constitutional:      General: She is not in acute distress. HENT:     Head: Normocephalic.  Eyes:     General:        Right eye: No discharge.        Left eye: No discharge.     Conjunctiva/sclera: Conjunctivae normal.  Cardiovascular:     Rate and Rhythm: Regular rhythm. Tachycardia present.  Pulmonary:     Effort: Pulmonary effort is normal. No respiratory distress.     Breath sounds: Normal breath sounds. No wheezing.     Comments: Crackles bilateral lung bases Abdominal:     General: Bowel sounds are normal. There is no distension.     Palpations: Abdomen is soft.  Musculoskeletal:     Right lower leg: Edema present.     Comments: Left wound VAC in place. serosanguinous drainage. Trace RLE pitting edema  Skin:    General: Skin is warm.  Psychiatric:        Mood and Affect: Mood normal.     Filed Weights   04/09/22 0500 04/10/22 0412 04/11/22 0500  Weight: 66.2 kg 65.8 kg 75.3 kg   Stable at 65.8 kg   Net IO Since Admission: -4,323.14 mL [04/11/22 0725]  Pertinent Labs:    Latest Ref Rng & Units 04/11/2022    1:01 AM 04/10/2022    1:38 AM 04/09/2022    2:32 AM  CBC  WBC 4.0 - 10.5 K/uL 7.7  7.7  9.2   Hemoglobin 12.0 - 15.0 g/dL 04/11/2022  8.6  8.0   Hematocrit 36.0 - 46.0 % 29.0  24.8  23.5   Platelets 150 - 400 K/uL 77  69  51        Latest Ref Rng & Units 04/11/2022     1:01 AM 04/10/2022    1:38 AM 04/09/2022    2:32 AM  CMP  Glucose 70 - 99 mg/dL 04/11/2022  86  532   BUN 8 - 23 mg/dL 14  16  17    Creatinine 0.44 - 1.00 mg/dL 992   4.26   Sodium 135 - 145 mmol/L 137  135  132   Potassium 3.5 - 5.1 mmol/L 3.3  3.4  3.1   Chloride 98 - 111 mmol/L 99  102  102   CO2 22 - 32 mmol/L 26  25  22    Calcium 8.9 - 10.3 mg/dL 7.9  7.4  7.1     Imaging: No results found.  Assessment/Plan:   Principal Problem:   Necrotizing fasciitis (HCC) Active Problems:   Hyponatremia   Hypotension   Bacteremia due to Streptococcus Pyogenes   Type 2 diabetes mellitus with hyperglycemia (HCC)   Beta-hemolytic group A streptococcal sepsis (HCC)  Hypokalemia   Patient Summary: Jill Munoz is a 71 y.o. with no known PMHx, who presented with worsening left hemorrhagic bulla of left leg  and admitted for necrotizing fasciitis of left leg, S. Pyogenes bacteremia, hypotonic hypovolemic hyponatremia and uncontrolled T2DM.    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  Plan for wound closure tomorrow. -Orthopedic surgery following; appreciate their recommendations  -Minimize opioids to avoid altered mental status   #S. Pyogenes bacteremia White count trending down.  No evidence of endocarditis. Repeat Bcx 6/21 no growth to date -Appreciate ID recommendation -Continue cefazolin -D/c linezolid as wound site not erythematous, serosanguinous -May be able to avoid PICC line per ID  #Acute Constipation -Continue miralax/senna, will decrease frequency if continues to have diarrhea -KUB if having liquid stool and/or abdominal pain   #Thrombocytopenia Improving since discontinuing linezolid. Suspect bone marrow suppression due to malnutrition vs linezolid.  No schistocytes on peripheral smear.  Patient has not been on heparin. -Continue to monitor.    #Hypotonic Hypovolemic Hyponatremia, resolved Multifactorial likely due to  necrotizing fasciitis, bacteremia, poor PO intake. Sodium 135 this morning -Encourage p.o. intake  #Hypokalemia #Hypomagnesemia K 3.4 -Repleted K -AM BMP   # Neurogenic Bladder, resolved In the setting of postoperative. Yesterday bladder scan 0 cc after foley removal. Seem to be urinating well with purewick.  -D/C bladder scans   # Type 2 Diabetes Mellitus  A1c 11%.  Fasting CBG 85 this morning.  -Discontinue Semglee 5 u - SSI, switch to sensivtive and CBG qac and qhs   # Iron Deficiency Anemia Hgb 8.9>8.0. - Daily CBC - Transfuse for hemoglobin < 7 - Avoid IV iron in the setting of bacteremia   #HFrEF EF 40-45%, global hypokinesis with G1DD. Good UOP -Will need GDMT but will start these after she is more stable from other acute problems -Outpatient cardiology follow up   # Left Subdural Hygroma  Incidental finding on CT head -Monitor for worsening neurological status  #Zinc Deficiency #Vitamin A deficiency #Vitamin B6 Deficiency -Vitamin A 10,000 unit daily x2 months -Pyridoxine 50 mg qd  x 2 weeks -Recheck levels after completion of supplementation -Appreciate RD recs   Diet: carb IVF: none VTE: Lovenox Code: Full PT/OT recs: CIR vs SNF TOC recs: pending   Dispo: Anticipated discharge to Skilled nursing facility/AIR in 4 days pending debridement of LLE and management of bacteremia  Park Pope, MD 04/11/2022, 7:25 AM Pager: [phone]  Please contact the on call pager after 5 pm and on weekends at [phone].

## 2022-04-11 NOTE — Anesthesia Preprocedure Evaluation (Addendum)
Anesthesia Evaluation  Patient identified by MRN, date of birth, ID band Patient awake    Reviewed: Allergy & Precautions, NPO status , Patient's Chart, lab work & pertinent test results  History of Anesthesia Complications Negative for: history of anesthetic complications  Airway Mallampati: III  TM Distance: >3 FB Neck ROM: Full    Dental  (+) Dental Advisory Given, Teeth Intact   Pulmonary neg pulmonary ROS,    Pulmonary exam normal        Cardiovascular hypertension, +CHF  Normal cardiovascular exam   '23 TTE - EF 40 to 45%. Global hypokinesis. There is severe asymmetric left ventricular hypertrophy of the basal-septal segment. Grade I diastolic dysfunction (impaired relaxation).      Neuro/Psych negative neurological ROS  negative psych ROS   GI/Hepatic negative GI ROS, Neg liver ROS,   Endo/Other  diabetes, Type 2 Ca 7.9 K 3.3   Renal/GU negative Renal ROS     Musculoskeletal negative musculoskeletal ROS (+)   Abdominal   Peds  Hematology  (+) Blood dyscrasia, anemia ,  Plt 77k    Anesthesia Other Findings Necrotizing fasciitis   Reproductive/Obstetrics                            Anesthesia Physical Anesthesia Plan  ASA: 3  Anesthesia Plan: General   Post-op Pain Management:    Induction: Intravenous  PONV Risk Score and Plan: 3 and Treatment may vary due to age or medical condition and Ondansetron  Airway Management Planned: LMA  Additional Equipment: None  Intra-op Plan:   Post-operative Plan: Extubation in OR  Informed Consent: I have reviewed the patients History and Physical, chart, labs and discussed the procedure including the risks, benefits and alternatives for the proposed anesthesia with the patient or authorized representative who has indicated his/her understanding and acceptance.     Dental advisory given  Plan Discussed with: CRNA and  Anesthesiologist  Anesthesia Plan Comments:        Anesthesia Quick Evaluation

## 2022-04-11 NOTE — Progress Notes (Signed)
OT Cancellation Note  Patient Details Name: Jill Munoz MRN: [id number] DOB: 24-Aug-1951   Cancelled Treatment:    Reason Eval/Treat Not Completed: Other (comment).  Patient had additional surgery, and resting comfortably.  OT to continue efforts as appropriate.    Chayna Surratt D Quincey Quesinberry 04/11/2022, 2:58 PM

## 2022-04-11 NOTE — Op Note (Signed)
04/11/2022  10:49 AM  PATIENT:  Jill Munoz    PRE-OPERATIVE DIAGNOSIS:  Necrotizing Fasciitis  POST-OPERATIVE DIAGNOSIS:  Same  PROCEDURE:  REVISION LEFT ABOVE KNEE AMPUTATION Application of customizable incisional wound VAC. Application of Kerecis micro powder 38 cm.  SURGEON:  [name], MD  PHYSICIAN ASSISTANT:None ANESTHESIA:   General  PREOPERATIVE INDICATIONS:  [name] is a  71 y.o. female with a diagnosis of Necrotizing Fasciitis who failed conservative measures and elected for surgical management.    The risks benefits and alternatives were discussed with the patient preoperatively including but not limited to the risks of infection, bleeding, nerve injury, cardiopulmonary complications, the need for revision surgery, among others, and the patient was willing to proceed.  OPERATIVE IMPLANTS: Kerecis micro powder 38 cm.  @ENCIMAGES @  OPERATIVE FINDINGS: Tissue margins were healthy and viable there were a few muscle groups that were nonviable that were resected back to healthy viable margins.  OPERATIVE PROCEDURE: Patient brought the operating room and underwent a general anesthetic.  After adequate levels anesthesia obtained patient's left lower extremity was prepped using DuraPrep draped into a sterile field a timeout was called.  The sponge was removed the majority of the wound bed had healthy viable muscle with good petechial bleeding.  There were a few muscle groups that were not viable that were resected.  There was fascia that was not healthy this was also resected.  The tissue margins were resected back approximately 2 cm back to healthy viable margins.  The wound was irrigated with normal saline electrocautery was used for hemostasis.  The wound was then covered with a 38 cm micro powder.  The deep fascia was closed using 2-0 Vicryl skin was closed using 2-0 nylon a Praveena customizable dressing compliantly.  This had a good suction fit patient was  extubated taken the PACU in stable condition.   DISCHARGE PLANNING:  Antibiotic duration: Continue antibiotics  Weightbearing: Nonweightbearing on the left  Pain medication: Continue opioid pathway  Dressing care/ Wound VAC: Continue wound VAC for 1 week after discharge  Ambulatory devices: Walker  Discharge to: Skilled nursing or CIR  Follow-up: In the office 1 week post operative.

## 2022-04-11 NOTE — Progress Notes (Signed)
Patient stated she is anxious about tomorrow's procedure and wants something to calm her self. Offered oxycodone, she didn't want to take any pain medication, she wanted anti anxiety medication. On call MD paged, received order for Atarax. Husband in the room, call bell within reach.care continues.

## 2022-04-12 ENCOUNTER — Encounter (HOSPITAL_COMMUNITY): Payer: Self-pay | Admitting: Orthopedic Surgery

## 2022-04-12 DIAGNOSIS — K5900 Constipation, unspecified: Secondary | ICD-10-CM

## 2022-04-12 DIAGNOSIS — D696 Thrombocytopenia, unspecified: Secondary | ICD-10-CM

## 2022-04-12 DIAGNOSIS — R7881 Bacteremia: Secondary | ICD-10-CM | POA: Diagnosis not present

## 2022-04-12 DIAGNOSIS — M726 Necrotizing fasciitis: Secondary | ICD-10-CM | POA: Diagnosis not present

## 2022-04-12 DIAGNOSIS — R531 Weakness: Secondary | ICD-10-CM

## 2022-04-12 DIAGNOSIS — B955 Unspecified streptococcus as the cause of diseases classified elsewhere: Secondary | ICD-10-CM | POA: Diagnosis not present

## 2022-04-12 DIAGNOSIS — B379 Candidiasis, unspecified: Secondary | ICD-10-CM

## 2022-04-12 DIAGNOSIS — I5020 Unspecified systolic (congestive) heart failure: Secondary | ICD-10-CM

## 2022-04-12 DIAGNOSIS — D649 Anemia, unspecified: Secondary | ICD-10-CM

## 2022-04-12 LAB — BASIC METABOLIC PANEL
Anion gap: 8 (ref 5–15)
BUN: 14 mg/dL (ref 8–23)
CO2: 25 mmol/L (ref 22–32)
Calcium: 7.6 mg/dL — ABNORMAL LOW (ref 8.9–10.3)
Chloride: 101 mmol/L (ref 98–111)
Creatinine, Ser: 0.75 mg/dL (ref 0.44–1.00)
GFR, Estimated: >60 mL/min (ref >60)
Glucose, Bld: 165 mg/dL — ABNORMAL HIGH (ref 70–99)
Potassium: 4.2 mmol/L (ref 3.5–5.1)
Sodium: 134 mmol/L — ABNORMAL LOW (ref 135–145)

## 2022-04-12 LAB — CBC
HCT: 26.8 % — ABNORMAL LOW (ref 36.0–46.0)
Hemoglobin: 8.8 g/dL — ABNORMAL LOW (ref 12.0–15.0)
MCH: 28.2 pg (ref 26.0–34.0)
MCHC: 32.8 g/dL (ref 30.0–36.0)
MCV: 85.9 fL (ref 80.0–100.0)
Platelets: 83 10*3/uL — ABNORMAL LOW (ref 150–400)
RBC: 3.12 MIL/uL — ABNORMAL LOW (ref 3.87–5.11)
RDW: 16.6 % — ABNORMAL HIGH (ref 11.5–15.5)
WBC: 9.5 10*3/uL (ref 4.0–10.5)
nRBC: 0.3 % — ABNORMAL HIGH (ref 0.0–0.2)

## 2022-04-12 LAB — GLUCOSE, CAPILLARY
Glucose-Capillary: 165 mg/dL — ABNORMAL HIGH (ref 70–99)
Glucose-Capillary: 149 mg/dL — ABNORMAL HIGH (ref 70–99)
Glucose-Capillary: 160 mg/dL — ABNORMAL HIGH (ref 70–99)
Glucose-Capillary: 228 mg/dL — ABNORMAL HIGH (ref 70–99)
Glucose-Capillary: 123 mg/dL — ABNORMAL HIGH (ref 70–99)

## 2022-04-12 MED ORDER — CEFADROXIL 500 MG PO CAPS
1000.0000 mg | ORAL_CAPSULE | Freq: Two times a day (BID) | ORAL | Status: DC
  Administered 2022-04-12 – 2022-04-16 (×9): 1000 mg via ORAL
  Filled 2022-04-12 (×10): qty 2

## 2022-04-12 NOTE — Plan of Care (Signed)
  Problem: Education: Goal: Knowledge of General Education information will improve Description: Including pain rating scale, medication(s)/side effects and non-pharmacologic comfort measures Outcome: Progressing   Problem: Health Behavior/Discharge Planning: Goal: Ability to manage health-related needs will improve Outcome: Progressing   Problem: Clinical Measurements: Goal: Ability to maintain clinical measurements within normal limits will improve Outcome: Progressing Goal: Will remain free from infection Outcome: Progressing Goal: Diagnostic test results will improve Outcome: Progressing Goal: Respiratory complications will improve Outcome: Progressing Goal: Cardiovascular complication will be avoided Outcome: Progressing   Problem: Activity: Goal: Risk for activity intolerance will decrease Outcome: Progressing   Problem: Nutrition: Goal: Adequate nutrition will be maintained Outcome: Progressing   Problem: Coping: Goal: Level of anxiety will decrease Outcome: Progressing   Problem: Elimination: Goal: Will not experience complications related to bowel motility Outcome: Progressing Goal: Will not experience complications related to urinary retention Outcome: Progressing   Problem: Pain Managment: Goal: General experience of comfort will improve Outcome: Progressing   Problem: Safety: Goal: Ability to remain free from injury will improve Outcome: Progressing   Problem: Skin Integrity: Goal: Risk for impaired skin integrity will decrease Outcome: Progressing   Problem: Education: Goal: Knowledge of the prescribed therapeutic regimen will improve Outcome: Progressing Goal: Ability to verbalize activity precautions or restrictions will improve Outcome: Progressing Goal: Understanding of discharge needs will improve Outcome: Progressing   Problem: Activity: Goal: Ability to perform//tolerate increased activity and mobilize with assistive devices will  improve Outcome: Progressing   Problem: Clinical Measurements: Goal: Postoperative complications will be avoided or minimized Outcome: Progressing   Problem: Self-Care: Goal: Ability to meet self-care needs will improve Outcome: Progressing   Problem: Self-Concept: Goal: Ability to maintain and perform role responsibilities to the fullest extent possible will improve Outcome: Progressing   Problem: Pain Management: Goal: Pain level will decrease with appropriate interventions Outcome: Progressing   Problem: Skin Integrity: Goal: Demonstration of wound healing without infection will improve Outcome: Progressing   Problem: Education: Goal: Ability to describe self-care measures that may prevent or decrease complications (Diabetes Survival Skills Education) will improve Outcome: Progressing Goal: Individualized Educational Video(s) Outcome: Progressing   Problem: Coping: Goal: Ability to adjust to condition or change in health will improve Outcome: Progressing   Problem: Fluid Volume: Goal: Ability to maintain a balanced intake and output will improve Outcome: Progressing   Problem: Health Behavior/Discharge Planning: Goal: Ability to identify and utilize available resources and services will improve Outcome: Progressing Goal: Ability to manage health-related needs will improve Outcome: Progressing   Problem: Metabolic: Goal: Ability to maintain appropriate glucose levels will improve Outcome: Progressing   Problem: Nutritional: Goal: Maintenance of adequate nutrition will improve Outcome: Progressing Goal: Progress toward achieving an optimal weight will improve Outcome: Progressing   Problem: Skin Integrity: Goal: Risk for impaired skin integrity will decrease Outcome: Progressing   Problem: Tissue Perfusion: Goal: Adequacy of tissue perfusion will improve Outcome: Progressing   

## 2022-04-12 NOTE — Progress Notes (Signed)
Inpatient Rehab Admissions Coordinator:   Postop therapy evaluations completed and pt is recommended for SNF.  CIR will sign off at this time.   Estill Dooms, PT, DPT Admissions Coordinator [phone] 04/12/22  11:36 AM

## 2022-04-12 NOTE — Progress Notes (Signed)
Patient ID: Jill Munoz, female   DOB: [birth year], 71 y.o.   MRN: [id number] Patient is a 71 year old woman who is status post revision of the left above-the-knee amputation.  There is no drainage in the wound VAC canister there is a good suction fit.  Patient may discharge to rehab at this time from an orthopedic standpoint.  She will discharge with the Praveena plus portable wound VAC pump and this will stay on for a week after discharge.
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
## 2022-04-12 NOTE — TOC Progression Note (Signed)
Transition of Care [facility]) - Progression Note    Patient Details  Name: Jill Munoz MRN: [id number] Date of Birth: [birth year]  Transition [facility]) CM/SW Contact  Eduard Roux, [place] Phone Number: 04/12/2022, 4:25 PM  Clinical Narrative:     CSW call spouse- left voice message to return call.  Antony Blackbird, MSW, LCSW Clinical Social Worker     Expected Discharge Plan: IP Rehab Facility Barriers to Discharge: Continued Medical Work up  Expected Discharge Plan and Services Expected Discharge Plan: IP Rehab Facility       Living arrangements for the past 2 months: Single Family Home                                       Social Determinants of Health (SDOH) Interventions    Readmission Risk Interventions     No data to display

## 2022-04-12 NOTE — Progress Notes (Signed)
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 Wound vac in place and functioning. With source control achieved will change antibiotics to oral cefadroxil for 14 days to complete treatment using 04/11/22 as the start date. Post surgical care per Orthopedics. Will arrange follow up in ID office. Remaining medical and supportive care per primary team.   PLAN:  Change antibiotics to Cefadroxil 1,000 mg po bid for 14 days. Post surgical wound care per Orthopedics.  Follow up in ID clinic.  Remaining medical and supportive care per primary team.  ID will sign off. Please re-consult if needed.   Principal Problem:   Necrotizing fasciitis (HCC) Active Problems:   Hyponatremia   Hypotension   Bacteremia due to Streptococcus Pyogenes   Type 2 diabetes mellitus with hyperglycemia (HCC)   Beta-hemolytic group A streptococcal sepsis (HCC)   Hypokalemia   Weakness    acetaminophen  1,000 mg Oral Q8H   vitamin C  1,000 mg Oral Daily   brimonidine  1 drop Both Eyes TID   cefadroxil  1,000 mg Oral BID   dorzolamide-timolol  1 drop Both Eyes BID   enoxaparin (LOVENOX) injection  40 mg Subcutaneous Q24H   feeding supplement  237 mL Oral TID BM   fluconazole  100 mg Oral Daily   insulin aspart  0-9 Units Subcutaneous TID WC   multivitamin with minerals  1 tablet Oral Daily   nutrition supplement (JUVEN)  1 packet Oral BID BM   pantoprazole  40 mg Oral Daily   polyethylene glycol  17 g Oral Daily   vitamin B-6  50 mg Oral Daily   senna-docusate  2 tablet Oral BID   sodium chloride flush  3 mL Intravenous Q12H   vitamin A  10,000 Units Oral Daily   zinc sulfate  220 mg Oral Daily    SUBJECTIVE:  Afebrile overnight with no acute events. Sleeping with family at bedside.   No Known Allergies   Review of Systems: Review  of Systems  Constitutional:  Negative for chills, fever and weight loss.  Respiratory:  Negative for cough, shortness of breath and wheezing.   Cardiovascular:  Negative for chest pain and leg swelling.  Gastrointestinal:  Negative for abdominal pain, constipation, diarrhea, nausea and vomiting.  Skin:  Negative for rash.      OBJECTIVE: Vitals:   04/12/22 0505 04/12/22 0530 04/12/22 0750 04/12/22 1115  BP: (!) 142/64  132/67 (!) 128/53  Pulse: 86 88 77 85  Resp: 18  16 17   Temp: 97.7 F (36.5 C)  97.6 F (36.4 C) 97.7 F (36.5 C)  TempSrc: Oral  Oral Oral  SpO2: 96% 97% 96% 95%  Weight:      Height:       Body mass index is 26.96 kg/m.  Physical Exam Constitutional:      General: She is not in acute distress.    Appearance: She is well-developed.  Cardiovascular:     Rate and Rhythm: Normal rate and regular rhythm.     Heart sounds: Normal heart sounds.  Pulmonary:     Effort: Pulmonary effort is normal.     Breath sounds: Normal breath sounds.  Musculoskeletal:     Comments: Wound vac in place and functioning  Skin:    General:  Skin is warm and dry.  Neurological:     Mental Status: She is lethargic.  Psychiatric:        Mood and Affect: Mood normal.     Lab Results Lab Results  Component Value Date   WBC 9.5 04/12/2022   HGB 8.8 (L) 04/12/2022   HCT 26.8 (L) 04/12/2022   MCV 85.9 04/12/2022   PLT 83 (L) 04/12/2022    Lab Results  Component Value Date   CREATININE 0.75 04/12/2022   BUN 14 04/12/2022   NA 134 (L) 04/12/2022   K 4.2 04/12/2022   CL 101 04/12/2022   CO2 25 04/12/2022    Lab Results  Component Value Date   ALT 22 04/03/2022   AST 26 04/03/2022   ALKPHOS 41 04/03/2022   BILITOT 0.6 04/03/2022     Microbiology: Recent Results (from the past 240 hour(s))  Fungus Culture With Stain     Status: None (Preliminary result)   Collection Time: 04/02/22  1:23 PM   Specimen: PATH Other; Body Fluid  Result Value Ref Range Status    Fungus Stain Final report  Final    Comment: (NOTE) Performed At: [facility] [address] [place], [place] [id number] Jolene Schimke MD AY:[id number]    Fungus (Mycology) Culture PENDING  Incomplete   Fungal Source WOUND  Final    Comment: Performed at [facility], [address], [place], [place] [address]  Aerobic/Anaerobic Culture w Gram Stain (surgical/deep wound)     Status: None   Collection Time: 04/02/22  1:23 PM   Specimen: PATH Other; Body Fluid  Result Value Ref Range Status   Specimen Description WOUND  Final   Special Requests LEFT LEG BULLA FLUID TEST ANCEF  Final   Gram Stain NO WBC SEEN RARE GRAM POSITIVE COCCI IN PAIRS   Final   Culture   Final    FEW GROUP A STREP (S.PYOGENES) ISOLATED Beta hemolytic streptococci are predictably susceptible to penicillin and other beta lactams. Susceptibility testing not routinely performed. RARE STAPHYLOCOCCUS LUGDUNENSIS NO ANAEROBES ISOLATED Performed at [facility], [address], [place], [place] [address]    Report Status 04/08/2022 FINAL  Final   Organism ID, Bacteria STAPHYLOCOCCUS LUGDUNENSIS  Final      Susceptibility   Staphylococcus lugdunensis - MIC*    CIPROFLOXACIN <=0.5 SENSITIVE Sensitive     ERYTHROMYCIN <=0.25 SENSITIVE Sensitive     GENTAMICIN <=0.5 SENSITIVE Sensitive     OXACILLIN 2 SENSITIVE Sensitive     TETRACYCLINE <=1 SENSITIVE Sensitive     VANCOMYCIN <=0.5 SENSITIVE Sensitive     TRIMETH/SULFA <=10 SENSITIVE Sensitive     CLINDAMYCIN <=0.25 SENSITIVE Sensitive     RIFAMPIN <=0.5 SENSITIVE Sensitive     Inducible Clindamycin NEGATIVE Sensitive     * RARE STAPHYLOCOCCUS LUGDUNENSIS  Fungus Culture Result     Status: None   Collection Time: 04/02/22  1:23 PM  Result Value Ref Range Status   Result 1 Comment  Final    Comment: (NOTE) KOH/Calcofluor preparation:  no fungus observed. Performed At: [facility] [address] [place], [place] [id number] Jolene Schimke MD [id number]   Fungus Culture With Stain     Status: None (Preliminary result)   Collection Time: 04/02/22  1:30 PM   Specimen: PATH Other; Tissue  Result Value Ref Range Status   Fungus Stain Final report  Final    Comment: (NOTE) Performed At: [facility] [address] [place], [place]  [id number] Jolene Schimke MD [id number]    Fungus (Mycology) Culture PENDING  Incomplete   Fungal Source WOUND  Final    Comment: Performed at [facility], [address] [place], [place] [address]  Aerobic/Anaerobic Culture w Gram Stain (surgical/deep wound)     Status: None   Collection Time: 04/02/22  1:30 PM   Specimen: PATH Other; Tissue  Result Value Ref Range Status   Specimen Description WOUND  Final   Special Requests LEFT LEG PROXIMAL MEDIAL DEEP TEST ANCEF  Final   Gram Stain   Final    FEW WBC PRESENT,BOTH PMN AND MONONUCLEAR ABUNDANT GRAM POSITIVE COCCI    Culture   Final    ABUNDANT GROUP A STREP (S.PYOGENES) ISOLATED Beta hemolytic streptococci are predictably susceptible to penicillin and other beta lactams. Susceptibility testing not routinely performed. NO ANAEROBES ISOLATED Performed at [facility], [address], [place], [place] [address]    Report Status 04/07/2022 FINAL  Final  Fungus Culture Result     Status: None   Collection Time: 04/02/22  1:30 PM  Result Value Ref Range Status   Result 1 Comment  Final    Comment: (NOTE) KOH/Calcofluor preparation:  no fungus observed. Performed At: [facility] [address] [place] [address] [place], [place] [id number] Jolene Schimke MD [id number]   Fungus Culture With Stain     Status: None (Preliminary result)   Collection Time: 04/02/22  1:32 PM   Specimen: PATH Other; Tissue  Result Value Ref Range Status   Fungus Stain Final report  Final    Comment: (NOTE) Performed At: [facility] [address] [place], [place] [id number] Jolene Schimke MD [id number]    Fungus  (Mycology) Culture PENDING  Incomplete   Fungal Source WOUND  Final    Comment: Performed at [facility], [address], [place], [place] [address]  Aerobic/Anaerobic Culture w Gram Stain (surgical/deep wound)     Status: None   Collection Time: 04/02/22  1:32 PM   Specimen: PATH Other; Tissue  Result Value Ref Range Status   Specimen Description WOUND  Final   Special Requests LEFT LEG DISTAL MEDIAL DEEP TEST ANCEF  Final   Gram Stain   Final    NO WBC SEEN ABUNDANT GRAM POSITIVE COCCI IN PAIRS IN CHAINS    Culture   Final    ABUNDANT GROUP A STREP (S.PYOGENES) ISOLATED Beta hemolytic streptococci are predictably susceptible to penicillin and other beta lactams. Susceptibility testing not routinely performed. NO ANAEROBES ISOLATED Performed at [facility], [address] [place], [place] [address]    Report Status 04/07/2022 FINAL  Final  Fungus Culture Result     Status: None   Collection Time: 04/02/22  1:32 PM  Result Value Ref Range Status   Result 1 Comment  Final    Comment: (NOTE) KOH/Calcofluor preparation:  no fungus observed. Performed At: [facility] [address] [place], [place] [id number] Jolene Schimke MD [id number]   Culture, blood (Routine X 2) w Reflex to ID Panel     Status: None   Collection Time: 04/04/22  5:41 AM   Specimen: BLOOD  Result Value Ref Range Status   Specimen Description BLOOD LEFT ANTECUBITAL  Final   Special Requests   Final    BOTTLES DRAWN AEROBIC AND ANAEROBIC Blood Culture adequate volume   Culture   Final    NO GROWTH 5 DAYS Performed at [facility], [address],  [place], [place] [address]    Report Status 04/09/2022 FINAL  Final  Culture, blood (Routine X 2) w Reflex to ID Panel     Status: None   Collection Time: 04/04/22  5:49 AM   Specimen: BLOOD LEFT HAND  Result Value Ref Range Status   Specimen Description BLOOD LEFT HAND  Final   Special Requests   Final    BOTTLES DRAWN AEROBIC  AND ANAEROBIC Blood Culture adequate volume   Culture   Final    NO GROWTH 5 DAYS Performed at [facility], [address], [place], [place] [address]    Report Status 04/09/2022 FINAL  Final  Aerobic/Anaerobic Culture w Gram Stain (surgical/deep wound)     Status: None   Collection Time: 04/04/22  2:18 PM   Specimen: PATH Other; Tissue  Result Value Ref Range Status   Specimen Description TISSUE  Final   Special Requests LEFT LEG NECTORIZING FASCITIS  Final   Gram Stain   Final    NO WBC SEEN MODERATE GRAM POSITIVE COCCI IN PAIRS FEW GRAM POSITIVE COCCI IN CHAINS    Culture   Final    ABUNDANT GROUP A STREP (S.PYOGENES) ISOLATED Beta hemolytic streptococci are predictably susceptible to penicillin and other beta lactams. Susceptibility testing not routinely performed. NO ANAEROBES ISOLATED Performed at [facility], [address], [place], [place] [address]    Report Status 04/09/2022 FINAL  Final  Aerobic/Anaerobic Culture w Gram Stain (surgical/deep wound)     Status: None   Collection Time: 04/04/22  2:33 PM   Specimen: PATH Soft tissue  Result Value Ref Range Status   Specimen Description TISSUE  Final   Special Requests LEFT THEIGH  Final   Gram Stain NO WBC SEEN NO ORGANISMS SEEN   Final   Culture   Final    RARE GROUP A STREP (S.PYOGENES) ISOLATED Beta hemolytic streptococci are predictably susceptible to penicillin and other beta lactams. Susceptibility testing not routinely performed. NO ANAEROBES ISOLATED Performed at [facility], [address] [place], [place] [address]    Report Status 04/09/2022 FINAL  Final     Marcos Eke, NP [facility]  04/12/2022  12:31 PM

## 2022-04-12 NOTE — Evaluation (Signed)
Clinical/Bedside Swallow Evaluation Patient Details  Name: Jill Munoz MRN: [id number] Date of Birth: 22-Jan-1951  Today's Date: 04/12/2022 Time: SLP Start Time (ACUTE ONLY): 1254 SLP Stop Time (ACUTE ONLY): 1307 SLP Time Calculation (min) (ACUTE ONLY): 13 min  Past Medical History:  Past Medical History:  Diagnosis Date   Pre-diabetes    Past Surgical History:  Past Surgical History:  Procedure Laterality Date   AMPUTATION  04/06/2022   Procedure: AMPUTATION ABOVE KNEE REVISION;  Surgeon: [name], MD;  Location: [facility] OR;  Service: Orthopedics;;   I & D EXTREMITY Left 04/02/2022   Procedure: IRRIGATION AND DEBRIDEMENT LEFT LOWER LEG APPLICATION OF WOUND VAC ;  Surgeon: Ernestina Columbia, MD;  Location: [facility] OR;  Service: Orthopedics;  Laterality: Left;   I & D EXTREMITY Left 04/04/2022   Procedure: LEFT ABOVE KNEE AMPUTATION;  Surgeon: [name], MD;  Location: [facility] OR;  Service: Orthopedics;  Laterality: Left;   I & D EXTREMITY Left 04/06/2022   Procedure: LEFT LEG DEBRIDEMENT;  Surgeon: [name], MD;  Location: [facility] OR;  Service: Orthopedics;  Laterality: Left;   HPI:  Pt is a 71 y.o. female admitted 04/02/22 with L ankle cellulitis, necrotizing fasciitis. S/p LLE I&D with wound vac placement 6/19. Course complicated by severe sepsis, AKI, acute metabolic encephalopathy. 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  S/p L AKA revision again 6/28 with wound vac. CXR 6/19 with no acute findings. PMH includes pre-diabetes.    Assessment / Plan / Recommendation  Clinical Impression  Pt presents with a mild oral dysphagia c/b generalized oral weakness and reduced ROM. Pt was seen in partially reclined position 2/2 pain in leg. Pt tolerated all consistencies trialed including thin liquid by straw without any clinical s/s of aspiraiton, but exhibited prolonged oral phase with solids and oral residuals which increased  with more advanced textures.  Pt consumed portions of midday meal tray with SLP including soup, mashed potatoes, cooked carrots, and pot roast with gravy.  Pt benefited from liquid wash to help clear oral residuals.  Pt has had very poor PO intake recently and required consistent encourage for each bolus trial with SLP.  RN reports pill dysphagia.  Pt has been taking meds crushed in puree, but some of her medications cannot be crushed.  Today, pt demonstrated ability to orally transit pill with pudding.  Recommend oral medication whole with pudding if crushing is not permissible.  Discussed diet preference with family and pt and offered continuing regular diet with choosing softer foods, versus mechanical soft diet.  Family is agreeable to mechanical soft solids.  Pt may not be able to meet nutritional needs orally even with modified diet. RD is following pt.  Of note, pt had round white spots on back of throat which appeared c/w thrush.  Pt endorsed throat pain.  Showed RN and messaged team.  Pt is already getting diflucan.     Recommend mechanical soft solids with thin liquids.  SLP will follow for diet tolerance and possible advancement.  SLP Visit Diagnosis: Dysphagia, oral phase (R13.11)    Aspiration Risk  No limitations    Diet Recommendation Dysphagia 3 (Mech soft);Thin liquid   Liquid Administration via: Straw Medication Administration: Crushed with puree (whole with puree if unable to be crushed) Supervision: Full supervision/cueing for compensatory strategies;Staff to assist with self feeding Compensations: Slow rate;Small sips/bites;Follow solids with liquid Postural Changes: Seated upright  at 90 degrees    Other  Recommendations Oral Care Recommendations: Oral care BID    Recommendations for follow up therapy are one component of a multi-disciplinary discharge planning process, led by the attending physician.  Recommendations may be updated based on patient status, additional  functional criteria and insurance authorization.  Follow up Recommendations Skilled nursing-short term rehab (<3 hours/day)      Assistance Recommended at Discharge Intermittent Supervision/Assistance  Functional Status Assessment Patient has had a recent decline in their functional status and demonstrates the ability to make significant improvements in function in a reasonable and predictable amount of time.  Frequency and Duration min 2x/week  2 weeks       Prognosis Prognosis for Safe Diet Advancement: Good      Swallow Study   General Date of Onset: 04/02/22 HPI: Pt is a 71 y.o. female admitted 04/02/22 with L ankle cellulitis, necrotizing fasciitis. S/p LLE I&D with wound vac placement 6/19. Course complicated by severe sepsis, AKI, acute metabolic encephalopathy. 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  S/p L AKA revision again 6/28 with wound vac. CXR 6/19 with no acute findings. PMH includes pre-diabetes. Type of Study: Bedside Swallow Evaluation Previous Swallow Assessment: None Diet Prior to this Study: Regular Temperature Spikes Noted: No History of Recent Intubation: No Behavior/Cognition: Alert Oral Cavity Assessment: Other (comment) (round white spots on oropharynx.  RN and MD notified.) Oral Care Completed by SLP: No Oral Cavity - Dentition: Adequate natural dentition Self-Feeding Abilities: Total assist Patient Positioning: Partially reclined Baseline Vocal Quality: Normal    Oral/Motor/Sensory Function Overall Oral Motor/Sensory Function: Mild impairment Facial ROM: Reduced right;Reduced left Facial Symmetry: Within Functional Limits Lingual ROM: Reduced right;Reduced left Lingual Symmetry: Within Functional Limits Lingual Strength: Reduced Velum: Within Functional Limits Mandible: Within Functional Limits   Ice Chips Ice chips: Not tested   Thin Liquid Thin Liquid: Within functional  limits    Nectar Thick Nectar Thick Liquid: Not tested   Honey Thick Honey Thick Liquid: Not tested   Puree Puree: Impaired Oral Phase Functional Implications: Prolonged oral transit   Solid     Solid: Impaired Oral Phase Functional Implications: Impaired mastication;Oral residue;Prolonged oral transit      Kerrie Pleasure, MA, CCC-SLP Acute Rehabilitation Services Office: [phone] 04/12/2022,1:34 PM

## 2022-04-12 NOTE — TOC Progression Note (Signed)
Transition of Care [facility]) - Progression Note    Patient Details  Name: Desteny Freeman MRN: [id number] Date of Birth: Nov 13, 1950  Transition of Care [facility]) CM/SW Contact  Eduard Roux, [place] Phone Number: 04/12/2022, 2:04 PM  Clinical Narrative:     CSW provided bed offers- family states they will review and provide SNF choice.  Antony Blackbird, MSW, LCSW Clinical Social Worker    Expected Discharge Plan: IP Rehab Facility Barriers to Discharge: Continued Medical Work up  Expected Discharge Plan and Services Expected Discharge Plan: IP Rehab Facility       Living arrangements for the past 2 months: Single Family Home                                       Social Determinants of Health (SDOH) Interventions    Readmission Risk Interventions     No data to display

## 2022-04-12 NOTE — Progress Notes (Signed)
OT Cancellation Note  Patient Details Name: Jill Munoz MRN: [id number] DOB: [birth year]   Cancelled Treatment:    Reason Eval/Treat Not Completed: Pain limiting ability to participate.  OT to continue efforts as appropriate.    Dickie Labarre D Dewan Emond 04/12/2022, 4:08 PM

## 2022-04-12 NOTE — Evaluation (Signed)
Physical Therapy Re-Evaluation Patient Details Name: Jill Munoz MRN: [id number] DOB: 1951/03/01 Today's Date: 04/12/2022  History of Present Illness  Pt is a 71 y.o. female admitted 04/02/22 with L ankle cellulitis, necrotizing fasciitis. S/p LLE I&D with wound vac placement 6/19. Course complicated by severe sepsis, AKI, acute metabolic encephalopathy. 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  S/p L AKA revision again 6/28 with wound vac.  PMH includes pre-diabetes.  Clinical Impression  Patient presents s/p another revision of her L AKA.  She is in pain and difficult to engage for long in tasks for mobility or functional ADL's.  She was able to move to EOB with max encouragement and total A.  She did participate in washing her face at EOB.  Patient sat about 5 minutes with mod A for balance.  She will continue to benefit from skilled PT in the acute setting.  Continue to recommend STSNF level rehab at d/c.         Recommendations for follow up therapy are one component of a multi-disciplinary discharge planning process, led by the attending physician.  Recommendations may be updated based on patient status, additional functional criteria and insurance authorization.  Follow Up Recommendations Skilled nursing-short term rehab (<3 hours/day) Can patient physically be transported by private vehicle: No    Assistance Recommended at Discharge Frequent or constant Supervision/Assistance  Patient can return home with the following  A lot of help with bathing/dressing/bathroom;Assistance with cooking/housework;Assistance with feeding;Direct supervision/assist for medications management;Direct supervision/assist for financial management;Assist for transportation;Help with stairs or ramp for entrance;Two people to help with walking and/or transfers    Equipment Recommendations Wheelchair (measurements PT);Wheelchair cushion  (measurements PT);Hospital bed;Other (comment)  Recommendations for Other Services       Functional Status Assessment Patient has had a recent decline in their functional status and demonstrates the ability to make significant improvements in function in a reasonable and predictable amount of time.     Precautions / Restrictions Precautions Precautions: Fall Precaution Comments: wound vac L LE Restrictions RLE Weight Bearing: Weight bearing as tolerated LLE Weight Bearing: Non weight bearing      Mobility  Bed Mobility Overal bed mobility: Needs Assistance Bed Mobility: Supine to Sit, Sit to Supine     Supine to sit: HOB elevated, +2 for physical assistance, Total assist Sit to supine: Total assist, +2 for safety/equipment   General bed mobility comments: assist for scooting hips then for lifting trunk using L UE; to supine assist for all aspects and scooted to [facility] for positioning and pillows under R shoulder as leans R in bed    Transfers                   General transfer comment: NT due to pain L LE    Ambulation/Gait                  Stairs            Wheelchair Mobility    Modified Rankin (Stroke Patients Only)       Balance Overall balance assessment: Needs assistance Sitting-balance support: Single extremity supported, Feet unsupported Sitting balance-Leahy Scale: Poor Sitting balance - Comments: mod A for sitting EOB  Pertinent Vitals/Pain Pain Assessment Faces Pain Scale: Hurts worst (with movement) Pain Location: left residual limb Pain Descriptors / Indicators: Discomfort, Grimacing, Guarding, Moaning, Operative site guarding Pain Intervention(s): Premedicated before session, Monitored during session, Limited activity within patient's tolerance    Home Living Family/patient expects to be discharged to:: Private residence Living Arrangements: Spouse/significant  other Available Help at Discharge: Family Type of Home: House Home Access: Stairs to enter       Home Layout: Multi-level Home Equipment: Agricultural consultant (2 wheels);Rollator (4 wheels);Shower seat      Prior Function Prior Level of Function : Needs assist             Mobility Comments: Pt reported using RW and rollator at home and in community       Hand Dominance   Dominant Hand: Right    Extremity/Trunk Assessment   Upper Extremity Assessment Upper Extremity Assessment: Generalized weakness    Lower Extremity Assessment Lower Extremity Assessment: LLE deficits/detail;RLE deficits/detail RLE Deficits / Details: AROM WFL, but preference with R hip external rotation and flexion LLE Deficits / Details: L AKA with wound vac; too painful to assess movement LLE: Unable to fully assess due to pain    Cervical / Trunk Assessment Cervical / Trunk Assessment: Kyphotic  Communication   Communication: Prefers language other than English  Cognition Arousal/Alertness: Lethargic, Awake/alert   Overall Cognitive Status: Impaired/Different from baseline Area of Impairment: Following commands, Safety/judgement, Attention                   Current Attention Level: Focused   Following Commands: Follows one step commands inconsistently, Follows one step commands with increased time Safety/Judgement: Decreased awareness of safety, Decreased awareness of deficits   Problem Solving: Slow processing, Decreased initiation, Difficulty sequencing, Requires verbal cues, Requires tactile cues General Comments: limited eye opening with cues for focusing on tasks for brief moments, c/o fatigue and L LE pain throughout        General Comments General comments (skin integrity, edema, etc.): spouse present in the room, on RA and encouraged deep breathing for lung expansion in sitting; brother and sister here first time in 23 years.    Exercises Other Exercises Other Exercises:  AAROM shoulder flexion and elbow flexion x 3 reps prior to pt c/o fatigue   Assessment/Plan    PT Assessment Patient needs continued PT services  PT Problem List Decreased strength;Decreased activity tolerance;Decreased balance;Decreased mobility;Decreased cognition;Decreased safety awareness;Decreased knowledge of precautions;Cardiopulmonary status limiting activity;Impaired sensation;Decreased skin integrity;Decreased range of motion;Decreased knowledge of use of DME       PT Treatment Interventions DME instruction;Gait training;Functional mobility training;Therapeutic activities;Balance training;Neuromuscular re-education;Patient/family education;Therapeutic exercise    PT Goals (Current goals can be found in the Care Plan section)  Acute Rehab PT Goals Patient Stated Goal: to tolerate mobility better PT Goal Formulation: With patient/family Time For Goal Achievement: 04/26/22 Potential to Achieve Goals: Fair    Frequency Min 2X/week     Co-evaluation               AM-PAC PT "6 Clicks" Mobility  Outcome Measure Help needed turning from your back to your side while in a flat bed without using bedrails?: Total Help needed moving from lying on your back to sitting on the side of a flat bed without using bedrails?: Total Help needed moving to and from a bed to a chair (including a wheelchair)?: Total Help needed standing up from a chair using your arms (e.g., wheelchair or bedside  chair)?: Total Help needed to walk in hospital room?: Total Help needed climbing 3-5 steps with a railing? : Total 6 Click Score: 6    End of Session   Activity Tolerance: Patient limited by pain Patient left: in bed;with call bell/phone within reach;with family/visitor present   PT Visit Diagnosis: Unsteadiness on feet (R26.81);Other abnormalities of gait and mobility (R26.89);Muscle weakness (generalized) (M62.81);Difficulty in walking, not elsewhere classified (R26.2);Pain Pain - Right/Left:  Left Pain - part of body: Leg    Time: 1000-1018 PT Time Calculation (min) (ACUTE ONLY): 18 min   Charges:   PT Evaluation $PT Re-evaluation: 1 Re-eval          Sheran Lawless, PT Acute Rehabilitation Services [phone] Office:[phone] 04/12/2022   Elray Mcgregor 04/12/2022, 10:55 AM

## 2022-04-13 DIAGNOSIS — R1310 Dysphagia, unspecified: Secondary | ICD-10-CM

## 2022-04-13 LAB — BASIC METABOLIC PANEL
Anion gap: 6 (ref 5–15)
BUN: 19 mg/dL (ref 8–23)
CO2: 24 mmol/L (ref 22–32)
Calcium: 7.6 mg/dL — ABNORMAL LOW (ref 8.9–10.3)
Chloride: 102 mmol/L (ref 98–111)
Creatinine, Ser: 0.63 mg/dL (ref 0.44–1.00)
GFR, Estimated: >60 mL/min (ref >60)
Glucose, Bld: 114 mg/dL — ABNORMAL HIGH (ref 70–99)
Potassium: 4.2 mmol/L (ref 3.5–5.1)
Sodium: 132 mmol/L — ABNORMAL LOW (ref 135–145)

## 2022-04-13 LAB — GLUCOSE, CAPILLARY
Glucose-Capillary: 103 mg/dL — ABNORMAL HIGH (ref 70–99)
Glucose-Capillary: 153 mg/dL — ABNORMAL HIGH (ref 70–99)
Glucose-Capillary: 184 mg/dL — ABNORMAL HIGH (ref 70–99)
Glucose-Capillary: 184 mg/dL — ABNORMAL HIGH (ref 70–99)

## 2022-04-13 LAB — CBC
HCT: 26 % — ABNORMAL LOW (ref 36.0–46.0)
Hemoglobin: 8.7 g/dL — ABNORMAL LOW (ref 12.0–15.0)
MCH: 28.8 pg (ref 26.0–34.0)
MCHC: 33.5 g/dL (ref 30.0–36.0)
MCV: 86.1 fL (ref 80.0–100.0)
Platelets: 107 10*3/uL — ABNORMAL LOW (ref 150–400)
RBC: 3.02 MIL/uL — ABNORMAL LOW (ref 3.87–5.11)
RDW: 16.5 % — ABNORMAL HIGH (ref 11.5–15.5)
WBC: 11.2 10*3/uL — ABNORMAL HIGH (ref 4.0–10.5)
nRBC: 0.3 % — ABNORMAL HIGH (ref 0.0–0.2)

## 2022-04-13 MED ORDER — BISACODYL 10 MG RE SUPP
10.0000 mg | Freq: Once | RECTAL | Status: AC
  Administered 2022-04-13: 10 mg via RECTAL
  Filled 2022-04-13: qty 1

## 2022-04-13 MED ORDER — SODIUM CHLORIDE 0.9 % IV SOLN
510.0000 mg | Freq: Once | INTRAVENOUS | Status: AC
  Administered 2022-04-13: 510 mg via INTRAVENOUS
  Filled 2022-04-13: qty 17

## 2022-04-13 MED ORDER — BRIMONIDINE TARTRATE 0.2 % OP SOLN
1.0000 [drp] | Freq: Three times a day (TID) | OPHTHALMIC | Status: DC
  Administered 2022-04-13 – 2022-04-16 (×10): 1 [drp] via OPHTHALMIC
  Filled 2022-04-13: qty 5

## 2022-04-13 MED ORDER — METOPROLOL TARTRATE 12.5 MG HALF TABLET
12.5000 mg | ORAL_TABLET | Freq: Two times a day (BID) | ORAL | Status: DC
  Administered 2022-04-13 – 2022-04-16 (×7): 12.5 mg via ORAL
  Filled 2022-04-13 (×7): qty 1

## 2022-04-13 MED ORDER — BISACODYL 5 MG PO TBEC
5.0000 mg | DELAYED_RELEASE_TABLET | Freq: Once | ORAL | Status: DC
Start: 2022-04-13 — End: 2022-04-13

## 2022-04-13 MED ORDER — TRAZODONE HCL 50 MG PO TABS
50.0000 mg | ORAL_TABLET | Freq: Every day | ORAL | Status: DC
  Administered 2022-04-13 – 2022-04-15 (×3): 50 mg via ORAL
  Filled 2022-04-13 (×3): qty 1

## 2022-04-13 MED ORDER — DORZOLAMIDE HCL-TIMOLOL MAL 2-0.5 % OP SOLN
1.0000 [drp] | Freq: Two times a day (BID) | OPHTHALMIC | Status: DC
  Administered 2022-04-13 – 2022-04-16 (×6): 1 [drp] via OPHTHALMIC
  Filled 2022-04-13: qty 10

## 2022-04-13 NOTE — TOC Progression Note (Signed)
Transition of Care (TOC) - Progression Note    Patient Details  Name: Jill Munoz MRN: [id number] Date of Birth: 03/01/1951  Transition of Care (TOC) CM/SW Contact  Cynthia N Johnson, LCSW Phone Number: 04/13/2022, 4:08 PM  Clinical Narrative:     CSW met with spouse- he selected [facility] and [facility].   [facility] would not have availability until Monday.   [facility] was the family's next choice- [facility] confirmed bed offer- informed SNF, anticipate discharge tomorrow   TOC will continue to follow and assist with discharge planning.  Cynthia Johnson, MSW, LCSW Clinical Social Worker    Expected Discharge Plan: IP Rehab Facility Barriers to Discharge: Continued Medical Work up  Expected Discharge Plan and Services Expected Discharge Plan: IP Rehab Facility       Living arrangements for the past 2 months: Single Family Home                                       Social Determinants of Health (SDOH) Interventions    Readmission Risk Interventions     No data to display          

## 2022-04-13 NOTE — Discharge Summary (Shared)
Name: Jill Munoz MRN: [id number] DOB: 1951-06-28 71 y.o. PCP: Pcp, No  Date of Admission: 04/02/2022  7:15 AM Date of Discharge: No discharge date for patient encounter. Attending Physician: Mercie Eon, MD  Discharge Diagnosis: 1. Principal Problem:   Necrotizing fasciitis of lower leg (HCC) Active Problems:   Hyponatremia   Hypotension   Bacteremia due to Streptococcus Pyogenes   Type 2 diabetes mellitus with hyperglycemia (HCC)   Beta-hemolytic group A streptococcal sepsis (HCC)   Hypokalemia   Weakness HFrEF Thrombocytopenia AKI Iron Deficiency Anemia Hypokalemia Hypomagnesemia Zinc deficiency Vitamin A Deficiency Vitamin B6 deficiency Left subdural hygroma  Discharge Medications: Allergies as of 04/13/2022   No Known Allergies   Med Rec must be completed prior to using this [facility]***       Disposition and follow-up:   Jill Munoz was discharged from [facility] in Stable condition.  At the hospital follow up visit please address:  1.   Necrotizing Fasciitis s/p Left AKA -Good pain control -Orthopedic follow up -No signs of infection  S pyogenes Bacteremia -Completed cefadroxil course (complete 7/12) -CBC  -ID follow up  Thrush Dysphagia -Completed diflucan course (complete 7/3) -Assess for further thrush  HFrEF -GDMT titration -Assess volume status  Acute Constipation -Assess bowel movement -Adjust bowel regiment as needed  Hyponatremia -CMP -Assess PO intake  Thrombocytopenia -CBC -Assess for any bleeding  Neurogenic Bladder -Assess for further urinary retention  Type 2 Diabetes Mellitus -Assess CBGs -Adjust medication as needed  Vitamin/Mineral Deficiencies -Assess if vit A, B6, and zinc  2.  Labs / imaging needed at time of follow-up: CBC, CMP, Magnesium, Vitamin A (in 2 months), vitamin B6  3.  Pending labs/ test needing follow-up: none  Follow-up Appointments:  Follow-up Information      Vu, Tonita Phoenix, MD Follow up.   Specialty: Infectious Diseases Why: 04/26/22 at 2:45 pm. Please call to reschedule if you are not able to make this appointment. Contact information: [address] [place] [address] [phone]                 Hospital Course by problem list: Jill Munoz is a 71 y.o.female with no known PMHx who was admitted to [facility] for necrotizing fasciitis complicated by group a streptococcus bacteremia. Hospital course is detailed below:  Left Leg Necrotizing fasciitis Patient was admitted for 4-day history of progressively worsening hemorrhagic bullae.  CT imaging consistent with necrotizing soft tissue infection.  Patient had diffuse involvement of left leg, foot, knee.  Orthopedic surgery emergently went for irrigation and debridement.  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  Patient had 2 further revisions of left knee amputation before wound closure to ensure amputation had healthy margins.  Patient recommended for SNF.   Group A strep bacteremia Patient's blood culture grew strep pyogenes.  Likely source of infection from left leg wound.  Patient was initially started on penicillin and linezolid.  Repeat blood cultures obtained 2 days after initial blood culture showed no growth. Patient was eventually transitioned to cefazolin. After good source control with revision amputation, patient was ultimately was discharged to finish 14-day course of cefadroxil.  Hypotonic hypovolemic hyponatremia Patient's sodium was low down to 120.  Suspected that this is secondary to poor p.o. intake as well as necrotizing fasciitis.  Patient received IV fluid and slowly was titrated up to normal range. Patient's sodium near time of discharge discharge was consistently  in the 130s.   AKI Neurogenic bladder AKI likely prerenal and secondary to poor p.o. intake as well as bacteremia.  Patient  had difficulty urinating initially with concerns of neurogenic bladder.  Patient required a Foley catheter but was able to successfully transition off this with successful voiding trials.  HFrEF New diagnosis. Echo showed 40-45% EF with global hypokinesis. Patient was started on metoprolol 12.5 mg bid at discharge.   Iron Deficiency Anemia Patient's iron saturation ratio of 3%. Received 4 units PRBC due to debridement and AKA. Patient received a dose of feraheme prior to discharge for further iron supplementation. Recommended to start PO iron supplementation as an outpatient.   T2DM Diabetic Neuropathy New diagnosis.  A1c 11%.  Patient was placed on a sliding scale insulin and long-acting while in hospital.  Given patient's fasting glucose was persistently in the low 100s despite holding long-acting insulin near time of discharge, decision was to discharge patient with metformin rather than insulin.  Low CBGs likely secondary to lack of appetite while in hospital.   Discharge Exam:   BP (!) 149/61 (BP Location: Left Arm)   Pulse 78   Temp 98.7 F (37.1 C) (Oral)   Resp 18   Ht 5\' 5"  (1.651 m)   Wt 73.5 kg   SpO2 96%   BMI 26.96 kg/m  Discharge exam: ***  Pertinent Labs, Studies, and Procedures:  ECHOCARDIOGRAM COMPLETE  Result Date: 04/03/2022    ECHOCARDIOGRAM REPORT   Patient Name:   Jill Munoz Date of Exam: 04/03/2022 Medical Rec #:  04/05/2022       Height:       65.0 in Accession #:    [id number]      Weight:       170.0 lb Date of Birth:  1950-12-06        BSA:          1.846 m Patient Age:    71 years        BP:           127/68 mmHg Patient Gender: F               HR:           97 bpm. Exam Location:  Inpatient Procedure: 2D Echo, Color Doppler and Cardiac Doppler Indications:    Bacteremia  History:        Patient has no prior history of Echocardiogram examinations.  Sonographer:    05/16/1951 Senior RDCS Referring Phys: Irving Burton JULIE MACHEN IMPRESSIONS  1. The mitral valve is  abnormally thickened. Though morphology favors calcification, cannot exclude small vegetation (study indication is bacteremia). TEE may be considered. No evidence of mitral valve regurgitation.  2. Left ventricular ejection fraction, by estimation, is 40 to 45%. The left ventricle has mildly decreased function. The left ventricle demonstrates global hypokinesis. There is severe asymmetric left ventricular hypertrophy of the basal-septal segment. Left ventricular diastolic parameters are consistent with Grade I diastolic dysfunction (impaired relaxation).  3. Right ventricular systolic function is normal. The right ventricular size is normal. Tricuspid regurgitation signal is inadequate for assessing PA pressure.  4. The aortic valve is tricuspid. Aortic valve regurgitation is not visualized. Comparison(s): No prior Echocardiogram. FINDINGS  Left Ventricle: Left ventricular ejection fraction, by estimation, is 40 to 45%. The left ventricle has mildly decreased function. The left ventricle demonstrates global hypokinesis. The left ventricular internal cavity size was small. There is severe asymmetric left ventricular hypertrophy of the basal-septal segment. Left ventricular  diastolic parameters are consistent with Grade I diastolic dysfunction (impaired relaxation). Right Ventricle: The right ventricular size is normal. No increase in right ventricular wall thickness. Right ventricular systolic function is normal. Tricuspid regurgitation signal is inadequate for assessing PA pressure. Left Atrium: Left atrial size was normal in size. Right Atrium: Right atrial size was normal in size. Pericardium: There is no evidence of pericardial effusion. Mitral Valve: The mitral valve is abnormal. No evidence of mitral valve regurgitation. Tricuspid Valve: The tricuspid valve is grossly normal. Tricuspid valve regurgitation is not demonstrated. No evidence of tricuspid stenosis. Aortic Valve: The aortic valve is tricuspid. Aortic  valve regurgitation is not visualized. Pulmonic Valve: The pulmonic valve was normal in structure. Pulmonic valve regurgitation is not visualized. No evidence of pulmonic stenosis. Aorta: The aortic root and ascending aorta are structurally normal, with no evidence of dilitation. IAS/Shunts: No atrial level shunt detected by color flow Doppler.  LEFT VENTRICLE PLAX 2D LVIDd:         3.30 cm     Diastology LVIDs:         2.60 cm     LV e' medial:    5.55 cm/s LV PW:         1.10 cm     LV E/e' medial:  12.0 LV IVS:        1.00 cm     LV e' lateral:   4.79 cm/s LVOT diam:     2.10 cm     LV E/e' lateral: 13.9 LV SV:         42 LV SV Index:   23 LVOT Area:     3.46 cm  LV Volumes (MOD) LV vol d, MOD A2C: 68.2 ml LV vol d, MOD A4C: 58.4 ml LV vol s, MOD A2C: 35.4 ml LV vol s, MOD A4C: 31.3 ml LV SV MOD A2C:     32.8 ml LV SV MOD A4C:     58.4 ml LV SV MOD BP:      30.3 ml RIGHT VENTRICLE RV S prime:     10.70 cm/s TAPSE (M-mode): 2.1 cm LEFT ATRIUM           Index        RIGHT ATRIUM          Index LA diam:      3.30 cm 1.79 cm/m   RA Area:     8.63 cm LA Vol (A2C): 50.4 ml 27.30 ml/m  RA Volume:   15.30 ml 8.29 ml/m LA Vol (A4C): 46.3 ml 25.08 ml/m  AORTIC VALVE LVOT Vmax:   65.90 cm/s LVOT Vmean:  48.900 cm/s LVOT VTI:    0.121 m  AORTA Ao Root diam: 3.00 cm Ao Asc diam:  3.20 cm MITRAL VALVE MV Area (PHT): 3.33 cm    SHUNTS MV Decel Time: 228 msec    Systemic VTI:  0.12 m MV E velocity: 66.40 cm/s  Systemic Diam: 2.10 cm MV A velocity: 77.10 cm/s MV E/A ratio:  0.86 Riley Lam MD Electronically signed by Riley Lam MD Signature Date/Time: 04/03/2022/12:22:48 PM    Final    CT EXTREMITY LOWER LEFT W WO CONTRAST  Result Date: 04/02/2022 CLINICAL DATA:  Large fluid blister along the distal left lower leg EXAM: CT OF THE LOWER LEFT EXTREMITY WITHOUT CONTRAST TECHNIQUE: Multidetector CT imaging of the lower left extremity was performed following the standard protocol before and during bolus  administration of intravenous contrast. RADIATION DOSE REDUCTION: This exam was  performed according to the departmental dose-optimization program which includes automated exposure control, adjustment of the mA and/or kV according to patient size and/or use of iterative reconstruction technique. CONTRAST:  OMNIPAQUE IOHEXOL 300 MG/ML  SOLN COMPARISON:  None Available. FINDINGS: Bones/Joint/Cartilage Generalized osteopenia. No fracture or dislocation. Normal alignment. No joint effusion. Ligaments Ligaments are suboptimally evaluated by CT. Muscles and Tendons Muscles are normal. No muscle atrophy. No intramuscular fluid collection or hematoma. Soft tissue Soft tissue edema throughout the left lower leg consistent with cellulitis. Soft tissue emphysema in the medial aspect of the distal left lower leg within the subcutaneous fat. Large skin blister along the distal medial lower leg/ankle measuring 8.3 x 2.8 x 1.6 cm. Peripheral vascular atherosclerotic disease. No soft tissue mass. IMPRESSION: 1. Soft tissue edema throughout the left lower leg consistent with cellulitis. Soft tissue emphysema in the medial aspect of the distal left lower leg within the subcutaneous fat concerning for necrotizing infection. Large skin blister along the distal medial lower leg/ankle measuring 8.3 x 2.8 x 1.6 cm. Electronically Signed   By: Elige Ko M.D.   On: 04/02/2022 09:23   CT Head Wo Contrast  Result Date: 04/02/2022 CLINICAL DATA:  Numbness or tingling, paresthesia (Ped 0-17y) EXAM: CT HEAD WITHOUT CONTRAST TECHNIQUE: Contiguous axial images were obtained from the base of the skull through the vertex without intravenous contrast. RADIATION DOSE REDUCTION: This exam was performed according to the departmental dose-optimization program which includes automated exposure control, adjustment of the mA and/or kV according to patient size and/or use of iterative reconstruction technique. COMPARISON:  None Available.  FINDINGS: Brain: There is no evidence of acute intracranial hemorrhage. There is a low density extra-axial prominence along the left cerebral hemisphere, measuring up to 1.0 cm along the left frontal lobe and 1.1 cm along the parietal lobe (coronal series 5, images 30 and 47).There is mild flattening of the adjacent cortex. No ventricular effacement. The basal cisterns are patent. No significant midline shift. Scattered subcortical and periventricular white matter hypodensities, nonspecific but likely sequela of chronic small vessel ischemic disease. Vascular: No hyperdense vessel or unexpected calcification. Skull: Negative for acute fracture. Sinuses/Orbits: There is complete opacification of the right sphenoid sinus. Minimal ethmoid air cell mucosal thickening. The mastoid air cells are clear. The orbits are unremarkable. Other: None. 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 No priors for comparison. No evidence of an acute component. Mild sequela of chronic small vessel ischemic disease. Electronically Signed   By: Caprice Renshaw M.D.   On: 04/02/2022 09:09   DG Chest 2 View  Result Date: 04/02/2022 CLINICAL DATA:  71 year old female with suspected sepsis. Foot wound. EXAM: CHEST - 2 VIEW COMPARISON:  None Available. FINDINGS: Upright AP and lateral views of the chest. AP view mildly rotated to the right. Low lung volumes. There is cardiomegaly. Other mediastinal contours are within normal limits. Visualized tracheal air column is within normal limits. No pneumothorax, pulmonary edema, pleural effusion, or consolidation identified. Paucity of bowel gas in the upper abdomen. Osteopenia and thoracic kyphosis. No acute osseous abnormality identified. IMPRESSION: Low lung volumes.  No acute  cardiopulmonary abnormality. Electronically Signed   By: Odessa Fleming M.D.   On: 04/02/2022 08:16       Latest Ref Rng & Units 04/13/2022    1:36 AM 04/12/2022  2:51 AM 04/11/2022    1:01 AM  CBC  WBC 4.0 - 10.5 K/uL 11.2  9.5  7.7   Hemoglobin 12.0 - 15.0 g/dL 8.7  8.8  16.110.0   Hematocrit 36.0 - 46.0 % 26.0  26.8  29.0   Platelets 150 - 400 K/uL 107  83  77       Latest Ref Rng & Units 04/13/2022    1:36 AM 04/12/2022    2:51 AM 04/11/2022    1:01 AM  CMP  Glucose 70 - 99 mg/dL 096114  045165  [id number]   BUN 8 - 23 mg/dL 19  14  14    Creatinine 0.44 - 1.00 mg/dL 8.110.63  9.140.75  7.820.69   Sodium 135 - 145 mmol/L 132  134  137   Potassium 3.5 - 5.1 mmol/L 4.2  4.2  3.3   Chloride 98 - 111 mmol/L 102  101  99   CO2 22 - 32 mmol/L 24  25  26    Calcium 8.9 - 10.3 mg/dL 7.6  7.6  7.9     Discharge Instructions:   Signed: Park PopeJi, Andrew, MD 04/13/2022, 6:52 PM   Pager: @[phone] @

## 2022-04-13 NOTE — Progress Notes (Signed)
RN was in room for morning med pass and patients husband took eye drops to administer to the patient himself. RN advised the patients husband that he is not to administer the patient her medication while she is in the hospital. Those meds are scheduled and the RN administers meds according to how they are ordered here not per what the patient does at home. The patient told her husband that she got eye drops last night and the husband was upset. He stated that the RN changed her meds, she does not get eye drops at night. The RN advised the husband that the meds in the hospital are administered as they are ordered by the doctors here.

## 2022-04-13 NOTE — Care Management Important Message (Signed)
Important Message  Patient Details  Name: Jill Munoz MRN: [id number] Date of Birth: [birth year]   Medicare Important Message Given:  Yes     Renie Ora 04/13/2022, 8:41 AM

## 2022-04-13 NOTE — TOC Progression Note (Signed)
Transition of Care [facility]) - Progression Note    Patient Details  Name: Jill Munoz MRN: [id number] Date of Birth: Nov 10, 1950  Transition of Care [facility]) CM/SW [place],  Phone Number: 04/13/2022, 12:02 PM  Clinical Narrative:     CSW met with spouse for SNF choice-her has not made decision yet, he plans to have SNF choice by 3pm.  Thurmond Butts, MSW, LCSW Clinical Social Worker    Expected Discharge Plan: IP Rehab Facility Barriers to Discharge: Continued Medical Work up  Expected Discharge Plan and Services Expected Discharge Plan: [place]       Living arrangements for the past 2 months: Single Family Home                                       Social Determinants of Health (SDOH) Interventions    Readmission Risk Interventions     No data to display

## 2022-04-13 NOTE — Progress Notes (Signed)
Pt only took a few bites of morning medications crushed in applesauce and refused the rest. MD notified.

## 2022-04-13 NOTE — Progress Notes (Addendum)
HD#11 Subjective:  Overnight Events: NAEON  Patient and spouse seen and assessed at bedside. No acute complaints.   Patient is medically stable for SNF placement  Objective:  Vital signs in last 24 hours: Vitals:   04/12/22 1707 04/12/22 1931 04/13/22 0003 04/13/22 0440  BP: (!) 117/52 (!) 127/53 (!) 109/46   Pulse: 82 82 72   Resp: 18 18 18    Temp: 98.1 F (36.7 C) 97.8 F (36.6 C) 97.7 F (36.5 C)   TempSrc: Oral Oral Oral   SpO2: 97% 96% 94% 94%  Weight:      Height:       Supplemental O2: Room Air   Physical Exam:  Physical Exam Constitutional:      General: She is not in acute distress. HENT:     Head: Normocephalic.  Eyes:     General:        Right eye: No discharge.        Left eye: No discharge.     Conjunctiva/sclera: Conjunctivae normal.  Cardiovascular:     Rate and Rhythm: Normal rate and regular rhythm.  Pulmonary:     Effort: Pulmonary effort is normal. No respiratory distress.     Breath sounds: Normal breath sounds. No wheezing.  Abdominal:     General: Bowel sounds are normal. There is no distension.     Palpations: Abdomen is soft.  Musculoskeletal:     Comments: Left wound VAC in place. No serosanguinous drainage noted. Trace RLE pitting edema  Skin:    General: Skin is warm.  Psychiatric:        Mood and Affect: Mood normal.     Filed Weights   04/11/22 0500 04/11/22 0913 04/12/22 0500  Weight: 75.3 kg 75.3 kg 73.5 kg   Stable at 65.8 kg   Net IO Since Admission: -6,477.05 mL [04/13/22 0642]  Pertinent Labs:    Latest Ref Rng & Units 04/13/2022    1:36 AM 04/12/2022    2:51 AM 04/11/2022    1:01 AM  CBC  WBC 4.0 - 10.5 K/uL 11.2  9.5  7.7   Hemoglobin 12.0 - 15.0 g/dL 8.7  8.8  04/13/2022   Hematocrit 36.0 - 46.0 % 26.0  26.8  29.0   Platelets 150 - 400 K/uL 107  83  77        Latest Ref Rng & Units 04/13/2022    1:36 AM 04/12/2022    2:51 AM 04/11/2022    1:01 AM  CMP  Glucose 70 - 99 mg/dL 04/13/2022  390  300   BUN 8 - 23  mg/dL 19  14  14    Creatinine 0.44 - 1.00 mg/dL 923   3.00   Sodium 135 - 145 mmol/L 132  134  137   Potassium 3.5 - 5.1 mmol/L 4.2  4.2  3.3   Chloride 98 - 111 mmol/L 102  101  99   CO2 22 - 32 mmol/L 24  25  26    Calcium 8.9 - 10.3 mg/dL 7.6  7.6  7.9     Imaging: No results found.  Assessment/Plan:   Principal Problem:   Necrotizing fasciitis (HCC) Active Problems:   Hyponatremia   Hypotension   Bacteremia due to Streptococcus Pyogenes   Type 2 diabetes mellitus with hyperglycemia (HCC)   Beta-hemolytic group A streptococcal sepsis (HCC)   Hypokalemia   Weakness   Patient Summary: Jill Munoz is a 71 y.o. with no known PMHx, who presented  with worsening left hemorrhagic bulla of left leg  and admitted for necrotizing fasciitis of left leg, S. Pyogenes bacteremia, hypotonic hypovolemic hyponatremia and uncontrolled T2DM.    # Necrotizing Fasciitis of left leg s/p left AKA and wound closure No serosanguinous drainage. Wound vac in place.  -Praveena + portable wound vac pump x1 week after discharge -Outpatient orthopedic follow up 1 week post op -Minimize opioids to avoid altered mental status -Encourage PO intake to promote wound healing   #S. Pyogenes bacteremia Slight rise in WBC. Otherwise doing well -Appreciate ID recommendation -Continue cefadroxil 1000 mg bid for 14 days (finish 7/12)  #Thrush #Dysphagia -Diflucan 100 mg 7 days (finish 7/3) -SLP recommending mechanical soft diet  #Acute Constipation -Continue miralax/senna, will decrease frequency if continues to have diarrhea -KUB if having liquid stool and/or abdominal pain   #Thrombocytopenia, improving Likely secondary to linezolid. Improving once off linezolid. -Continue to monitor.    #Hypotonic Hypovolemic Hyponatremia Sodium 132 this morning -Encourage p.o. intake  #Hypokalemia, resolved #Hypomagnesemia, resolved -AM BMP   # Neurogenic Bladder, resolved Urinating well. Will  monitor since just post op. 2 L out yesterday. -Purewick   # Type 2 Diabetes Mellitus  A1c 11%.  Fasting CBG 103 this morning. - SSI, sensitive and CBG qac and qhs - Consider starting metformin 500 mg qd   # Iron Deficiency Anemia 8.8>8.7 - Daily CBC - Transfuse for hemoglobin < 7 - Feraheme x1, minimizing IVF due to HFrEF - PO iron supplementation at discharge   #HFrEF EF 40-45%, global hypokinesis with G1DD. Trace pitting edema of right leg. May need diuresis if shows sign of hypervolemia.  -Start metoprolol tartrate 12.5 mg bid -Outpatient cardiology follow up   # Left Subdural Hygroma  Incidental finding on CT head -Monitor for worsening neurological status  #Zinc Deficiency #Vitamin A deficiency #Vitamin B6 Deficiency -Vitamin A 10,000 unit daily x2 months -Pyridoxine 50 mg qd  x 2 weeks -Recheck levels after completion of supplementation -Appreciate RD recs   Diet: mechanical soft IVF: none VTE: Lovenox Code: Full PT/OT recs: SNF TOC recs: pending   Dispo: Anticipated discharge to SNF in 1-2 days pending SNF placement  Park Pope, MD 04/13/2022, 6:42 AM Pager: [phone]  Please contact the on call pager after 5 pm and on weekends at [phone].

## 2022-04-14 LAB — GLUCOSE, CAPILLARY
Glucose-Capillary: 175 mg/dL — ABNORMAL HIGH (ref 70–99)
Glucose-Capillary: 169 mg/dL — ABNORMAL HIGH (ref 70–99)
Glucose-Capillary: 157 mg/dL — ABNORMAL HIGH (ref 70–99)
Glucose-Capillary: 115 mg/dL — ABNORMAL HIGH (ref 70–99)

## 2022-04-14 LAB — CBC
HCT: 22.4 % — ABNORMAL LOW (ref 36.0–46.0)
Hemoglobin: 7.6 g/dL — ABNORMAL LOW (ref 12.0–15.0)
MCH: 28.6 pg (ref 26.0–34.0)
MCHC: 33.9 g/dL (ref 30.0–36.0)
MCV: 84.2 fL (ref 80.0–100.0)
Platelets: 111 10*3/uL — ABNORMAL LOW (ref 150–400)
RBC: 2.66 MIL/uL — ABNORMAL LOW (ref 3.87–5.11)
RDW: 16.5 % — ABNORMAL HIGH (ref 11.5–15.5)
WBC: 8.1 10*3/uL (ref 4.0–10.5)
nRBC: 0.6 % — ABNORMAL HIGH (ref 0.0–0.2)

## 2022-04-14 LAB — BASIC METABOLIC PANEL
Anion gap: 9 (ref 5–15)
BUN: 16 mg/dL (ref 8–23)
CO2: 25 mmol/L (ref 22–32)
Calcium: 7.7 mg/dL — ABNORMAL LOW (ref 8.9–10.3)
Chloride: 99 mmol/L (ref 98–111)
Creatinine, Ser: 0.62 mg/dL (ref 0.44–1.00)
GFR, Estimated: >60 mL/min (ref >60)
Glucose, Bld: 226 mg/dL — ABNORMAL HIGH (ref 70–99)
Potassium: 3.8 mmol/L (ref 3.5–5.1)
Sodium: 133 mmol/L — ABNORMAL LOW (ref 135–145)

## 2022-04-14 MED ORDER — METOPROLOL TARTRATE 25 MG PO TABS: 12.5000 mg | ORAL_TABLET | Freq: Two times a day (BID) | ORAL | Status: DC

## 2022-04-14 MED ORDER — POLYETHYLENE GLYCOL 3350 17 G PO PACK: 17.0000 g | PACK | Freq: Every day | ORAL | 0 refills | Status: DC

## 2022-04-14 MED ORDER — CEFADROXIL 500 MG PO CAPS: 1000.0000 mg | ORAL_CAPSULE | Freq: Two times a day (BID) | ORAL | 0 refills | Status: DC

## 2022-04-14 MED ORDER — LIDOCAINE VISCOUS HCL 2 % MT SOLN: 15.0000 mL | OROMUCOSAL | 0 refills | Status: DC | PRN

## 2022-04-14 MED ORDER — FERROUS SULFATE 325 (65 FE) MG PO TABS: 325.0000 mg | ORAL_TABLET | Freq: Every day | ORAL | 1 refills | Status: DC

## 2022-04-14 MED ORDER — DORZOLAMIDE HCL-TIMOLOL MAL 2-0.5 % OP SOLN: 1.0000 [drp] | Freq: Two times a day (BID) | OPHTHALMIC | 12 refills | Status: AC

## 2022-04-14 MED ORDER — OXYCODONE HCL 5 MG PO TABS: 5.0000 mg | ORAL_TABLET | ORAL | 0 refills | Status: DC | PRN

## 2022-04-14 MED ORDER — FLUCONAZOLE 100 MG PO TABS
100.0000 mg | ORAL_TABLET | Freq: Every day | ORAL | 0 refills | Status: DC
Start: 2022-04-14 — End: 2022-04-16

## 2022-04-14 MED ORDER — TRAZODONE HCL 50 MG PO TABS: 50.0000 mg | ORAL_TABLET | Freq: Every day | ORAL | Status: DC

## 2022-04-14 MED ORDER — PANTOPRAZOLE SODIUM 40 MG PO TBEC: 40.0000 mg | DELAYED_RELEASE_TABLET | Freq: Every day | ORAL | Status: DC

## 2022-04-14 MED ORDER — ACETAMINOPHEN 500 MG PO TABS: 1000.0000 mg | ORAL_TABLET | Freq: Three times a day (TID) | ORAL | 0 refills | Status: DC

## 2022-04-14 MED ORDER — SENNOSIDES-DOCUSATE SODIUM 8.6-50 MG PO TABS: 2.0000 | ORAL_TABLET | Freq: Two times a day (BID) | ORAL | Status: DC

## 2022-04-14 NOTE — Progress Notes (Signed)
In and Out cath done Patient Tol. Well. 1000 ml of clear yellow urne. Sherrilyn Rist R.N. assisting.

## 2022-04-14 NOTE — Progress Notes (Signed)
Hospital day#12 Subjective:    Overnight Events: Patient was bladder scanned - . Straight cath output 1L.   This AM, patient has been able to void without assistance. Throat discomfort has improved on Diflucan but swallowing still bothers her. Denies pain at the surgical site or general discomfort. Ready to go home as soon as the SNF takes her. Denied n/v/   Objective:  Vital signs in last 24 hours: Vitals:   04/13/22 2355 04/14/22 0406 04/14/22 0813 04/14/22 1114  BP: (!) 124/49 (!) 118/51 135/65 (!) 146/64  Pulse: 75 75 71 73  Resp: 13 15 16 17   Temp: (!) 97.5 F (36.4 C) (!) 97.5 F (36.4 C) 97.6 F (36.4 C) 97.7 F (36.5 C)  TempSrc: Axillary Oral Oral Oral  SpO2: 96% 95% 99% 96%  Weight:  70.8 kg    Height:       Supplemental O2: Room Air SpO2: 96 % O2 Flow Rate (L/min): 2 L/min   Physical Exam:  Constitutional: well-appearing woman, reclining in bed,  in no acute distress HENT: erythematous mucosa with flaky white plaques on tongue and posterior mouth. Cardiovascular: regular rate and rhythm Pulmonary/Chest: normal work of breathing on room air, lungs clear to auscultation bilaterally Abdominal: soft, non-tender, non-distended MSK: L BKA wound vac on site, no erythema or fluid around site. No output on wound vac. R leg well perfused. Neurological: alert & oriented x 3 Skin: warm and dry Psych: normal mood and affect  Filed Weights   04/11/22 0913 04/12/22 0500 04/14/22 0406  Weight: 75.3 kg 73.5 kg 70.8 kg     Intake/Output Summary (Last 24 hours) at 04/14/2022 1411 Last data filed at 04/14/2022 1114 Gross per 24 hour  Intake 110 ml  Output 2500 ml  Net -2390 ml   Net IO Since Admission: -9,867.05 mL [04/14/22 1411]  Pertinent Labs:    Latest Ref Rng & Units 04/14/2022    1:41 AM 04/13/2022    1:36 AM 04/12/2022    2:51 AM  CBC  WBC 4.0 - 10.5 K/uL 8.1  11.2  9.5   Hemoglobin 12.0 - 15.0 g/dL 7.6  8.7  8.8   Hematocrit 36.0 - 46.0 % 22.4   26.0  26.8   Platelets 150 - 400 K/uL 111  107  83        Latest Ref Rng & Units 04/14/2022    1:41 AM 04/13/2022    1:36 AM 04/12/2022    2:51 AM  CMP  Glucose 70 - 99 mg/dL 04/14/2022  322  025   BUN 8 - 23 mg/dL 16  19  14    Creatinine 0.44 - 1.00 mg/dL 427   0.62   Sodium 135 - 145 mmol/L 133  132  134   Potassium 3.5 - 5.1 mmol/L 3.8  4.2  4.2   Chloride 98 - 111 mmol/L 99  102  101   CO2 22 - 32 mmol/L 25  24  25    Calcium 8.9 - 10.3 mg/dL 7.7  7.6  7.6     Imaging: No results found.  Assessment/Plan:   Principal Problem:   Necrotizing fasciitis of lower leg (HCC) Active Problems:   Hyponatremia   Hypotension   Bacteremia due to Streptococcus Pyogenes   Type 2 diabetes mellitus with hyperglycemia (HCC)   Beta-hemolytic group A streptococcal sepsis (HCC)   Hypokalemia   Weakness   Patient Summary: Jill Munoz is a 71 y.o. with a pertinent PMH of T2DM, HFrEF,  Iron deficiency anemia, presented with necrotizing fasciitis of the L leg and strep pyogenes bacteremia s/p L AKA, hypotonic hypovolemic hyponatremia, and uncontrolled T2DM   #Necrotizing fasciitis #L AKA and wound vac - No wound drainage with wound vac in place - Orthopedic follow up 1wk after dischage - Encourage PO intake to promote wound healing  #Strep pyogenes bacteremia WBC down to 8.1. No clinical signs of worsening, patient doing well -Continue cefadroxil 1000 mg BID for 14 days (finish 7/12) -ID following, appreciate recommendations -Patient scheduled to see ID outpatient after discharge  #Thrush #Dysphagia Endorses improvement overall with mild discomfort with foods - Continue Diflucan 100 mg (finishes on 04/16/22) - Started on lidocaine wash to help with discomfort -SLP recommends mechanical soft diet  #Acute constipation Stable - Continue Miralax/Senna, re-evaluate if diarrhea  #Thrombocytopenia Improving, likely in the setting of Linezolid therapy now discontinued. - Continue to  monitor  #Hypotonic Hypovolemic Hyponatremia Na 133 today - Encourage PO intake  #Neurogenic bladder Bladder scan overnight . Straight cathh 1L ovenright Patient able to void without problem - Consider post void bladder scan if discomfort  #T2DM A1C 11%. FS: 175 - SSI, sensitive and CBG qac and qhs - Plan is to restarting metformin 500 mg on discharge or at the discretion of the  SNF  8.8>8.7 - Daily CBC - Transfuse for hemoglobin < 7 - Feraheme x1, minimizing IVF due to HFrEF - PO iron supplementation at discharge   #HFrEF EF 40-45%, global hypokinesis with G1DD. Today, has trace pitting edema of R leg.  Monitor for hypervolemia and diuresis  -Start metoprolol tartrate 12.5 mg bid  # Left Subdural Hygroma  Incidental finding on CT head -Monitor for worsening neurological status   #Zinc Deficiency #Vitamin A deficiency #Vitamin B6 Deficiency Patient has been declining supplements. Okay to skip to prioritize PO antibiotics and antifungal. -Vitamin A 10,000 unit daily x2 months -Pyridoxine 50 mg qd  x 2 weeks -Recheck levels after completion of supplementation -Appreciate RD recs   Diet: mechanical soft IVF: none VTE: Lovenox Code: Full PT/OT recs: SNF TOC recs: pending   Dispo: Anticipated discharge to Skilled nursing facility in 2 days. [facility] has a slot for patient but will be unable to receive her until Monday, 04/16/2022.   Morene Crocker, MD Internal Medicine Resident PGY-1 Please contact the on call pager after 5 pm and on weekends at [phone].

## 2022-04-14 NOTE — TOC Progression Note (Signed)
Transition of Care [facility]) - Progression Note    Patient Details  Name: Jill Munoz MRN: [id number] Date of Birth: [birth year]  Transition of [facility]) CM/SW Contact  Jimmy Picket, [place] Phone Number: 04/14/2022, 12:24 PM  Clinical Narrative:     [facility] is unable to accept pt to facility until Monday.   Expected Discharge Plan: IP Rehab Facility Barriers to Discharge: Continued Medical Work up  Expected Discharge Plan and Services Expected Discharge Plan: IP Rehab Facility       Living arrangements for the past 2 months: Single Family Home Expected Discharge Date: 04/14/22                                     Social Determinants of Health (SDOH) Interventions    Readmission Risk Interventions     No data to display         Jimmy Picket, LCSW Clinical Social Worker

## 2022-04-14 NOTE — Discharge Instructions (Addendum)
Dear Jill Munoz,  You were seen in the hospital for an infection on you left leg and in your blood, which we have treated by amputating the left leg above the knee and and a course of antibiotics. You now have a wound vac in place to help with healing and will need to continue an antibiotic called cefadroxil 1000mg  twice daily until 04/25/2012 and continue to see physical therapy at the skilled nursing home facility to help you get stronger. We also found that you had some infection in your throat - we have a medication called Diflucan that you should continue to take 100 mg daily and finish on 04/17/2022.  You are being discharged to a skilled nursing facility called [facility]. Please keep in mind the following information:  Follow up appointments:  Infectious disease: your appointment has been scheduled for 04/26/22 @ 2:15PM with Dr. 04/28/22 [address] [place] [address] [place] Please call:  if you cannot make your appointment. Phone: [phone]   Please call the following providers to make follow-up appointments Orthopedics - please call for 1 week follow up: [phone], MD [phone] [facility]  [address]  [place]

## 2022-04-14 NOTE — Progress Notes (Addendum)
Patient has not voided since Day shift yesterday. Bladder scan greater than 654 patient is comfort. Has not been drinking well. Dr. Burnice Logan page and call return. See orders for in and out cath now. Tim R.N. aware.

## 2022-04-15 LAB — GLUCOSE, CAPILLARY
Glucose-Capillary: 121 mg/dL — ABNORMAL HIGH (ref 70–99)
Glucose-Capillary: 151 mg/dL — ABNORMAL HIGH (ref 70–99)
Glucose-Capillary: 127 mg/dL — ABNORMAL HIGH (ref 70–99)
Glucose-Capillary: 163 mg/dL — ABNORMAL HIGH (ref 70–99)

## 2022-04-15 MED ORDER — LIDOCAINE VISCOUS HCL 2 % MT SOLN
15.0000 mL | OROMUCOSAL | Status: DC | PRN
  Filled 2022-04-15: qty 15

## 2022-04-15 MED ORDER — HYDROXYZINE HCL 10 MG PO TABS: 10.0000 mg | ORAL_TABLET | Freq: Every evening | ORAL | Status: DC | PRN

## 2022-04-15 NOTE — Progress Notes (Addendum)
Hospital day#13 Subjective:    Overnight Events: No acute overnight events.  Evaluated patient at bedside.  She reports doing well this morning, no acute complaints.  Her throat discomfort is gradually improving.  No pain at her surgical site.  Family at bedside, asked about wound healing process.  Discussed plan to go to SNF tomorrow.    Objective:  Vital signs in last 24 hours: Vitals:   04/14/22 2037 04/15/22 0025 04/15/22 0339 04/15/22 0740  BP: (!) 138/58 (!) 124/56 (!) 149/62 (!) 164/65  Pulse: 78 67 73 74  Resp: 16 17 18 17   Temp: 98.2 F (36.8 C) 97.7 F (36.5 C) 98 F (36.7 C) 97.6 F (36.4 C)  TempSrc: Oral Oral Oral Oral  SpO2: 97% 98% 98% 97%  Weight:   72.1 kg   Height:       Supplemental O2: Room Air SpO2: 97 % O2 Flow Rate (L/min): 2 L/min   Physical Exam:  General: well-appearing female, resting comfortably in bed, NAD. HENT: erythematous mucosa with flaky white plaques on tongue and oral mucosa, improving. CV: normal rate and regular rhythm.  No murmurs rubs or gallops. Chest: Clear to auscultation bilaterally, no adventitious breath sounds noted. Abdomen: Soft, nontender, nondistended.  Normoactive bowel sounds. MSK: Status post left AKA, wound VAC in place c/d/I.  No output in wound VAC. Trace pitting edema in RLE. Neuro: AAOx3, no focal deficits noted. Skin: warm and dry.   Filed Weights   04/12/22 0500 04/14/22 0406 04/15/22 0339  Weight: 73.5 kg 70.8 kg 72.1 kg     Intake/Output Summary (Last 24 hours) at 04/15/2022 0932 Last data filed at 04/14/2022 1700 Gross per 24 hour  Intake --  Output 1100 ml  Net -1100 ml   Net IO Since Admission: -10,567.05 mL [04/15/22 0932]  Pertinent Labs:    Latest Ref Rng & Units 04/14/2022    1:41 AM 04/13/2022    1:36 AM 04/12/2022    2:51 AM  CBC  WBC 4.0 - 10.5 K/uL 8.1  11.2  9.5   Hemoglobin 12.0 - 15.0 g/dL 7.6  8.7  8.8   Hematocrit 36.0 - 46.0 % 22.4  26.0  26.8   Platelets 150 - 400 K/uL  111  107  83        Latest Ref Rng & Units 04/14/2022    1:41 AM 04/13/2022    1:36 AM 04/12/2022    2:51 AM  CMP  Glucose 70 - 99 mg/dL 04/14/2022  097  353   BUN 8 - 23 mg/dL 16  19  14    Creatinine 0.44 - 1.00 mg/dL 299   2.42   Sodium 135 - 145 mmol/L 133  132  134   Potassium 3.5 - 5.1 mmol/L 3.8  4.2  4.2   Chloride 98 - 111 mmol/L 99  102  101   CO2 22 - 32 mmol/L 25  24  25    Calcium 8.9 - 10.3 mg/dL 7.7  7.6  7.6     Imaging: No results found.  Assessment/Plan:   Principal Problem:   Necrotizing fasciitis of lower leg (HCC) Active Problems:   Hyponatremia   Hypotension   Bacteremia due to Streptococcus Pyogenes   Type 2 diabetes mellitus with hyperglycemia (HCC)   Beta-hemolytic group A streptococcal sepsis (HCC)   Hypokalemia   Weakness   Patient Summary: Jill Munoz is a 71 y.o. with a pertinent PMH of T2DM, HFrEF, Iron deficiency anemia, presented with  necrotizing fasciitis of the L leg and strep pyogenes bacteremia s/p L AKA, currently medically stable for discharge and awaiting SNF placement to [facility] tomorrow.   #Necrotizing fasciitis #L AKA and wound vac Wound appears to be healing well, no active drainage noted in wound VAC. She remains medically stable for discharge, awaiting placement to [facility] tomorrow. - Orthopedic follow up 1wk after dischage - Encourage PO intake to promote wound healing  #Strep pyogenes bacteremia Remains afebrile. Leukocytosis had resolved yesterday. Overall, appears to be improving clinically. -Continue cefadroxil 1000 mg BID for 14 days (last day 04/25/22) -ID following, appreciate recommendations -F/u with ID as outpatient  #Thrush #Dysphagia Patient throat discomfort improving with diflucan therapy, oral thrush appears to be improving on exam as well. - Continue Diflucan 100 mg (last day 04/16/22) - prn viscous lidocaine mouthwash to help with pain - mechanical soft diet per SLP  #Constipation BM  overnight. Appears to be tolerating laxative therapy well. - Continue miralax daily and senna-s BID  #Thrombocytopenia Improving as of yesterday. Likely in the setting of zyvox therapy which has since been discontinued.  - monitor  #Hypotonic Hypovolemic Hyponatremia Mild, anticipate improvement with increased PO intake. - Encourage PO intake  #Urinary retention Bladder scan yesterday showing ~662mL with subsequent in and out cath producing 1L urine. She has been producing good urine output for the last few days so doubt this is true urinary retention. Produced 1.1L UOP overnight. No difficulties with voiding. - Consider post void bladder scan if discomfort  #T2DM A1C 11%. Consider starting metformin 500mg  on discharge or at [facility] after discharge. CBGs at goal. -sensitive SSI -CBG qac and qhs  Iron deficiency anemia Given one dose of feraheme for repletion, minimizing IVF due to concomitant HFrEF. 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 Trace pitting edema of RLE. -continue metoprolol tartrate 12.5 mg bid  # Left Subdural Hygroma  Incidental finding on CT head -Monitor for worsening neurological status   #Zinc Deficiency #Vitamin A deficiency #Vitamin B6 Deficiency Patient has been declining supplements. Okay to skip to prioritize PO antibiotics and antifungal. Can resume supplementation as outpatient. -Vitamin A 10,000 unit daily x2 months -Pyridoxine 50 mg qd  x 2 weeks -Recheck levels after completion of supplementation -Appreciate RD recs   Diet: mechanical soft IVF: none VTE: Lovenox Code: Full PT/OT recs: SNF TOC recs: pending   Dispo: Anticipated discharge to Skilled nursing facility in 1 day. [facility] has a slot for patient but will be unable to receive her until Monday, 04/16/2022.   06/17/2022, MD Internal Medicine Resident PGY-3 Please contact  the on call pager after 5 pm and on weekends at [phone].

## 2022-04-16 DIAGNOSIS — Z741 Need for assistance with personal care: Secondary | ICD-10-CM | POA: Diagnosis not present

## 2022-04-16 DIAGNOSIS — I5023 Acute on chronic systolic (congestive) heart failure: Secondary | ICD-10-CM | POA: Diagnosis not present

## 2022-04-16 DIAGNOSIS — M726 Necrotizing fasciitis: Secondary | ICD-10-CM | POA: Diagnosis not present

## 2022-04-16 DIAGNOSIS — R2681 Unsteadiness on feet: Secondary | ICD-10-CM | POA: Diagnosis not present

## 2022-04-16 DIAGNOSIS — Z89612 Acquired absence of left leg above knee: Secondary | ICD-10-CM | POA: Diagnosis not present

## 2022-04-16 DIAGNOSIS — F451 Undifferentiated somatoform disorder: Secondary | ICD-10-CM | POA: Diagnosis not present

## 2022-04-16 DIAGNOSIS — R030 Elevated blood-pressure reading, without diagnosis of hypertension: Secondary | ICD-10-CM | POA: Diagnosis not present

## 2022-04-16 DIAGNOSIS — K5901 Slow transit constipation: Secondary | ICD-10-CM | POA: Diagnosis not present

## 2022-04-16 DIAGNOSIS — A491 Streptococcal infection, unspecified site: Secondary | ICD-10-CM | POA: Diagnosis not present

## 2022-04-16 DIAGNOSIS — E639 Nutritional deficiency, unspecified: Secondary | ICD-10-CM | POA: Diagnosis not present

## 2022-04-16 DIAGNOSIS — R262 Difficulty in walking, not elsewhere classified: Secondary | ICD-10-CM | POA: Diagnosis not present

## 2022-04-16 DIAGNOSIS — I700 Atherosclerosis of aorta: Secondary | ICD-10-CM | POA: Diagnosis not present

## 2022-04-16 DIAGNOSIS — E1169 Type 2 diabetes mellitus with other specified complication: Secondary | ICD-10-CM

## 2022-04-16 DIAGNOSIS — K560 Paralytic ileus: Secondary | ICD-10-CM | POA: Diagnosis not present

## 2022-04-16 DIAGNOSIS — J189 Pneumonia, unspecified organism: Secondary | ICD-10-CM | POA: Diagnosis not present

## 2022-04-16 DIAGNOSIS — R1312 Dysphagia, oropharyngeal phase: Secondary | ICD-10-CM | POA: Diagnosis not present

## 2022-04-16 DIAGNOSIS — R7881 Bacteremia: Secondary | ICD-10-CM | POA: Diagnosis not present

## 2022-04-16 DIAGNOSIS — R1013 Epigastric pain: Secondary | ICD-10-CM | POA: Diagnosis not present

## 2022-04-16 DIAGNOSIS — Z9189 Other specified personal risk factors, not elsewhere classified: Secondary | ICD-10-CM | POA: Diagnosis not present

## 2022-04-16 DIAGNOSIS — K5900 Constipation, unspecified: Secondary | ICD-10-CM | POA: Diagnosis not present

## 2022-04-16 DIAGNOSIS — M6281 Muscle weakness (generalized): Secondary | ICD-10-CM | POA: Diagnosis not present

## 2022-04-16 DIAGNOSIS — R0789 Other chest pain: Secondary | ICD-10-CM | POA: Diagnosis not present

## 2022-04-16 DIAGNOSIS — Z7401 Bed confinement status: Secondary | ICD-10-CM | POA: Diagnosis not present

## 2022-04-16 DIAGNOSIS — M6259 Muscle wasting and atrophy, not elsewhere classified, multiple sites: Secondary | ICD-10-CM | POA: Diagnosis not present

## 2022-04-16 DIAGNOSIS — D509 Iron deficiency anemia, unspecified: Secondary | ICD-10-CM | POA: Diagnosis not present

## 2022-04-16 DIAGNOSIS — I5022 Chronic systolic (congestive) heart failure: Secondary | ICD-10-CM | POA: Diagnosis not present

## 2022-04-16 DIAGNOSIS — R2689 Other abnormalities of gait and mobility: Secondary | ICD-10-CM | POA: Diagnosis not present

## 2022-04-16 DIAGNOSIS — R5381 Other malaise: Secondary | ICD-10-CM | POA: Diagnosis not present

## 2022-04-16 DIAGNOSIS — F418 Other specified anxiety disorders: Secondary | ICD-10-CM | POA: Diagnosis not present

## 2022-04-16 DIAGNOSIS — R079 Chest pain, unspecified: Secondary | ICD-10-CM | POA: Diagnosis not present

## 2022-04-16 DIAGNOSIS — B955 Unspecified streptococcus as the cause of diseases classified elsewhere: Secondary | ICD-10-CM | POA: Diagnosis not present

## 2022-04-16 DIAGNOSIS — F5101 Primary insomnia: Secondary | ICD-10-CM | POA: Diagnosis not present

## 2022-04-16 DIAGNOSIS — K5909 Other constipation: Secondary | ICD-10-CM

## 2022-04-16 DIAGNOSIS — R062 Wheezing: Secondary | ICD-10-CM | POA: Diagnosis not present

## 2022-04-16 DIAGNOSIS — R5383 Other fatigue: Secondary | ICD-10-CM | POA: Diagnosis not present

## 2022-04-16 DIAGNOSIS — E871 Hypo-osmolality and hyponatremia: Secondary | ICD-10-CM | POA: Diagnosis not present

## 2022-04-16 DIAGNOSIS — D649 Anemia, unspecified: Secondary | ICD-10-CM | POA: Diagnosis not present

## 2022-04-16 DIAGNOSIS — R1310 Dysphagia, unspecified: Secondary | ICD-10-CM | POA: Diagnosis not present

## 2022-04-16 DIAGNOSIS — E0841 Diabetes mellitus due to underlying condition with diabetic mononeuropathy: Secondary | ICD-10-CM | POA: Diagnosis not present

## 2022-04-16 DIAGNOSIS — R100 Acute abdomen: Secondary | ICD-10-CM | POA: Diagnosis not present

## 2022-04-16 DIAGNOSIS — R109 Unspecified abdominal pain: Secondary | ICD-10-CM | POA: Diagnosis not present

## 2022-04-16 DIAGNOSIS — R918 Other nonspecific abnormal finding of lung field: Secondary | ICD-10-CM | POA: Diagnosis not present

## 2022-04-16 DIAGNOSIS — R140 Abdominal distension (gaseous): Secondary | ICD-10-CM | POA: Diagnosis not present

## 2022-04-16 DIAGNOSIS — R41841 Cognitive communication deficit: Secondary | ICD-10-CM | POA: Diagnosis not present

## 2022-04-16 DIAGNOSIS — K449 Diaphragmatic hernia without obstruction or gangrene: Secondary | ICD-10-CM | POA: Diagnosis not present

## 2022-04-16 DIAGNOSIS — R531 Weakness: Secondary | ICD-10-CM | POA: Diagnosis not present

## 2022-04-16 DIAGNOSIS — F4321 Adjustment disorder with depressed mood: Secondary | ICD-10-CM | POA: Diagnosis not present

## 2022-04-16 DIAGNOSIS — R198 Other specified symptoms and signs involving the digestive system and abdomen: Secondary | ICD-10-CM | POA: Diagnosis not present

## 2022-04-16 DIAGNOSIS — D75839 Thrombocytosis, unspecified: Secondary | ICD-10-CM | POA: Diagnosis not present

## 2022-04-16 DIAGNOSIS — E118 Type 2 diabetes mellitus with unspecified complications: Secondary | ICD-10-CM | POA: Diagnosis not present

## 2022-04-16 DIAGNOSIS — Z9181 History of falling: Secondary | ICD-10-CM | POA: Diagnosis not present

## 2022-04-16 DIAGNOSIS — R1311 Dysphagia, oral phase: Secondary | ICD-10-CM | POA: Diagnosis not present

## 2022-04-16 DIAGNOSIS — J984 Other disorders of lung: Secondary | ICD-10-CM | POA: Diagnosis not present

## 2022-04-16 DIAGNOSIS — I2510 Atherosclerotic heart disease of native coronary artery without angina pectoris: Secondary | ICD-10-CM | POA: Diagnosis not present

## 2022-04-16 LAB — CBC
HCT: 24.7 % — ABNORMAL LOW (ref 36.0–46.0)
Hemoglobin: 8 g/dL — ABNORMAL LOW (ref 12.0–15.0)
MCH: 27.4 pg (ref 26.0–34.0)
MCHC: 32.4 g/dL (ref 30.0–36.0)
MCV: 84.6 fL (ref 80.0–100.0)
Platelets: 256 10*3/uL (ref 150–400)
RBC: 2.92 MIL/uL — ABNORMAL LOW (ref 3.87–5.11)
RDW: 16.8 % — ABNORMAL HIGH (ref 11.5–15.5)
WBC: 7 10*3/uL (ref 4.0–10.5)
nRBC: 0 % (ref 0.0–0.2)

## 2022-04-16 LAB — GLUCOSE, CAPILLARY
Glucose-Capillary: 156 mg/dL — ABNORMAL HIGH (ref 70–99)
Glucose-Capillary: 288 mg/dL — ABNORMAL HIGH (ref 70–99)
Glucose-Capillary: 216 mg/dL — ABNORMAL HIGH (ref 70–99)

## 2022-04-16 MED ORDER — OXYCODONE HCL 5 MG PO TABS: 5.0000 mg | ORAL_TABLET | ORAL | 0 refills | Status: DC | PRN

## 2022-04-16 MED ORDER — SENNOSIDES-DOCUSATE SODIUM 8.6-50 MG PO TABS: 2.0000 | ORAL_TABLET | Freq: Every evening | ORAL | Status: DC | PRN

## 2022-04-16 MED ORDER — POLYETHYLENE GLYCOL 3350 17 G PO PACK: 17.0000 g | PACK | Freq: Every day | ORAL | Status: DC | PRN

## 2022-04-16 MED ORDER — LIDOCAINE VISCOUS HCL 2 % MT SOLN: 15.0000 mL | OROMUCOSAL | 0 refills | Status: DC | PRN

## 2022-04-16 MED ORDER — POLYETHYLENE GLYCOL 3350 17 G PO PACK: 17.0000 g | PACK | Freq: Every day | ORAL | 0 refills | Status: DC | PRN

## 2022-04-16 MED ORDER — CEFADROXIL 1 G PO TABS: 1.0000 g | ORAL_TABLET | Freq: Two times a day (BID) | ORAL | 0 refills | Status: AC

## 2022-04-16 NOTE — Progress Notes (Signed)
Occupational Therapy Treatment Patient Details Name: Jill Munoz MRN: [id number] DOB: February 05, 1951 Today's Date: 04/16/2022   History of present illness Pt is a 71 y.o. female admitted 04/02/22 with L ankle cellulitis, necrotizing fasciitis. S/p LLE I&D with wound vac placement 6/19. Course complicated by severe sepsis, AKI, acute metabolic encephalopathy. 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  S/p L AKA revision again 6/28 with wound vac.  PMH includes pre-diabetes.   OT comments  Pt is making progress towards OT goals this session. INitially patient was max A +2 to come EOB, then initially max to mod A for balance seated EOB, through multimodal cues Pt able to progress to min guard and participate in 2 grooming tasks for both UB and LB, engaging in meaningful occupation. Pt was then max A +2 with bed pad used as scoop to transfer to the recliner (built up with pillows and with lift-pad in place) Pt continues to benefit from skilled OT in the acute setting and will require SNF post-op to maximize safety and independence in ADL   Recommendations for follow up therapy are one component of a multi-disciplinary discharge planning process, led by the attending physician.  Recommendations may be updated based on patient status, additional functional criteria and insurance authorization.    Follow Up Recommendations  Skilled nursing-short term rehab (<3 hours/day)    Assistance Recommended at Discharge Frequent or constant Supervision/Assistance  Patient can return home with the following  Two people to help with walking and/or transfers;A lot of help with bathing/dressing/bathroom;Assist for transportation;Help with stairs or ramp for entrance;Direct supervision/assist for medications management;Assistance with cooking/housework;Direct supervision/assist for financial management   Equipment Recommendations  Wheelchair cushion  (measurements OT);Wheelchair (measurements OT);Hospital bed;Other (comment) (hoyer lift)    Recommendations for Other Services      Precautions / Restrictions Precautions Precautions: Fall Precaution Comments: wound vac L LE Restrictions Weight Bearing Restrictions: Yes RLE Weight Bearing: Weight bearing as tolerated LLE Weight Bearing: Non weight bearing Other Position/Activity Restrictions: Wound VAC       Mobility Bed Mobility Overal bed mobility: Needs Assistance Bed Mobility: Supine to Sit Rolling: Max assist, Total assist   Supine to sit: HOB elevated, +2 for physical assistance, Max assist     General bed mobility comments: Pt needed assist to facilitate hips to EOB with use of pad with pt moving her right LE a little to come to EOB. Pt encouraged to use her UEs with rail on right to assist with sitting up needing max assist of 2 to come to sitting with use ofpad.    Transfers Overall transfer level: Needs assistance Equipment used: None Transfers: Bed to chair/wheelchair/BSC            Lateral/Scoot Transfers: Max assist, +2 physical assistance, From elevated surface General transfer comment: Pt needed max assist to perform lateral scoot with use of pad to assist pt to drop arm recliner.     Balance Overall balance assessment: Needs assistance Sitting-balance support: Single extremity supported, Bilateral upper extremity supported, No upper extremity supported (1 foot supported) Sitting balance-Leahy Scale: Poor Sitting balance - Comments: max A for sitting EOB initially until pt obtained balance and pt progressing to min guard assist and was ble to sit up to 10 min at EOB with cues to posture. Pt very kyphotic. Postural control: Posterior lean     Standing balance comment: deferred  ADL either performed or assessed with clinical judgement   ADL Overall ADL's : Needs assistance/impaired     Grooming: Wash/dry face;Min  guard;Sitting Grooming Details (indicate cue type and reason): supported sitting             Lower Body Dressing: Maximal assistance;Sitting/lateral leans Lower Body Dressing Details (indicate cue type and reason): to don sock, Pt able to lift foot up and PT slid on Toilet Transfer: Maximal assistance;+2 for physical assistance;+2 for safety/equipment;Squat-pivot Toilet Transfer Details (indicate cue type and reason): use of bed pad to scoop/lift Toileting- Clothing Manipulation and Hygiene: Total assistance              Extremity/Trunk Assessment              Vision   Vision Assessment?: No apparent visual deficits   Perception     Praxis      Cognition Arousal/Alertness: Awake/alert Behavior During Therapy: Flat affect Overall Cognitive Status: Impaired/Different from baseline Area of Impairment: Following commands, Safety/judgement, Attention                   Current Attention Level: Sustained   Following Commands: Follows one step commands inconsistently, Follows one step commands with increased time Safety/Judgement: Decreased awareness of safety, Decreased awareness of deficits     General Comments: language barrier might impact - husband acting as Nurse, learning disability. 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 Comments spouse present    Pertinent Vitals/ Pain       Pain Assessment Pain Assessment: Faces Faces Pain Scale: Hurts even more (with movement) Pain Location: left residual limb Pain Descriptors / Indicators: Discomfort, Grimacing, Guarding, Moaning, Operative site guarding Pain Intervention(s): Limited activity within patient's tolerance, Monitored during session, Repositioned  Home Living                                          Prior Functioning/Environment              Frequency  Min 2X/week        Progress Toward Goals  OT Goals(current goals can now be  found in the care plan section)  Progress towards OT goals: Progressing toward goals  Acute Rehab OT Goals Patient Stated Goal: find out when she is leaving for Rehab OT Goal Formulation: With patient/family Time For Goal Achievement: 04/21/22 Potential to Achieve Goals: Fair  Plan Discharge plan remains appropriate    Co-evaluation    PT/OT/SLP Co-Evaluation/Treatment: Yes Reason for Co-Treatment: Complexity of the patient's impairments (multi-system involvement);For patient/therapist safety;To address functional/ADL transfers PT goals addressed during session: Mobility/safety with mobility;Balance;Strengthening/ROM OT goals addressed during session: ADL's and self-care;Strengthening/ROM      AM-PAC OT "6 Clicks" Daily Activity     Outcome Measure   Help from another person eating meals?: A Little Help from another person taking care of personal grooming?: A Lot Help from another person toileting, which includes using toliet, bedpan, or urinal?: Total Help from another person bathing (including washing, rinsing, drying)?: A Lot Help from another person to put on and taking off regular upper body clothing?: A Lot Help from another person to put on and taking off regular lower body clothing?: A Lot 6 Click Score: 12    End of Session Equipment Utilized During Treatment: Gait  belt  OT Visit Diagnosis: Other abnormalities of gait and mobility (R26.89);Muscle weakness (generalized) (M62.81);Other symptoms and signs involving cognitive function;Unsteadiness on feet (R26.81)   Activity Tolerance Patient tolerated treatment well   Patient Left in chair;with call bell/phone within reach;Other (comment) (maximove pad underneath Pt)   Nurse Communication Need for lift equipment        Time: [id number] OT Time Calculation (min): 31 min  Charges: OT General Charges $OT Visit: 1 Visit OT Treatments $Self Care/Home Management : 8-22 mins  Nyoka Cowden OTR/L Acute Rehabilitation  Services Office: [phone]  Evern Bio [facility] 04/16/2022, 1:14 PM

## 2022-04-16 NOTE — Progress Notes (Addendum)
Physical Therapy Treatment Patient Details Name: Jill Munoz MRN: [id number] DOB: [birth year] Today's Date: 04/16/2022   History of Present Illness Pt is a 71 y.o. female admitted 04/02/22 with L ankle cellulitis, necrotizing fasciitis. S/p LLE I&D with wound vac placement 6/19. Course complicated by severe sepsis, AKI, acute metabolic encephalopathy. 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  S/p L AKA revision again 6/28 with wound vac.  PMH includes pre-diabetes.    PT Comments    Pt admitted with above diagnosis. Pt was able to sit EOB for 15 minutes working on balance and some LE exercises while sitting EOB. Had pt rub lotion on her leg as well as wash her lips with cloth.  Pt needed +2 max assist to scoot to drop arm recliner with use of pad.  Co treat with OT for pt safety. Pt needs continued therapy.  Pt currently with functional limitations due to balance and endurance deficits. Pt will benefit from skilled PT to increase their independence and safety with mobility to allow discharge to the venue listed below.      Recommendations for follow up therapy are one component of a multi-disciplinary discharge planning process, led by the attending physician.  Recommendations may be updated based on patient status, additional functional criteria and insurance authorization.  Follow Up Recommendations  Skilled nursing-short term rehab (<3 hours/day) Can patient physically be transported by private vehicle: No   Assistance Recommended at Discharge Frequent or constant Supervision/Assistance  Patient can return home with the following A lot of help with bathing/dressing/bathroom;Assistance with cooking/housework;Assistance with feeding;Direct supervision/assist for medications management;Direct supervision/assist for financial management;Assist for transportation;Help with stairs or ramp for entrance;Two people to help with  walking and/or transfers   Equipment Recommendations  Wheelchair (measurements PT);Wheelchair cushion (measurements PT);Hospital bed;Other (comment)    Recommendations for Other Services       Precautions / Restrictions Precautions Precautions: Fall Precaution Comments: wound vac L LE Restrictions Weight Bearing Restrictions: Yes RLE Weight Bearing: Weight bearing as tolerated LLE Weight Bearing: Non weight bearing Other Position/Activity Restrictions: Wound VAC     Mobility  Bed Mobility Overal bed mobility: Needs Assistance Bed Mobility: Supine to Sit Rolling: Max assist, Total assist   Supine to sit: HOB elevated, +2 for physical assistance, Max assist     General bed mobility comments: Pt needed assist to facilitate hips to EOB with use of pad with pt moving her right LE a little to come to EOB. Pt encouraged to use her UEs with rail on right to assist with sitting up needing max assist of 2 to come to sitting with use ofpad.    Transfers Overall transfer level: Needs assistance Equipment used: None Transfers: Bed to chair/wheelchair/BSC            Lateral/Scoot Transfers: Max assist, +2 physical assistance, From elevated surface General transfer comment: Pt needed max assist to perform lateral scoot with use of pad to assist pt to drop arm recliner.    Ambulation/Gait               General Gait Details: deferred   Stairs             Wheelchair Mobility    Modified Rankin (Stroke Patients Only)       Balance Overall balance assessment: Needs assistance Sitting-balance support: Single extremity supported, Bilateral upper extremity supported, No upper extremity supported (1 foot supported) Sitting balance-Leahy Scale: Poor  Sitting balance - Comments: max A for sitting EOB initially until pt obtained balance and pt progressing to min guard assist and was ble to sit up to 10 min at EOB with cues to posture. Pt very kyphotic. Postural control:  Posterior lean     Standing balance comment: deferred                            Cognition Arousal/Alertness: Awake/alert Behavior During Therapy: Flat affect Overall Cognitive Status: Impaired/Different from baseline Area of Impairment: Following commands, Safety/judgement, Attention                 Orientation Level: Disoriented to, Time Current Attention Level: Focused Memory: Decreased recall of precautions Following Commands: Follows one step commands inconsistently, Follows one step commands with increased time Safety/Judgement: Decreased awareness of safety, Decreased awareness of deficits Awareness: Intellectual Problem Solving: Slow processing, Decreased initiation, Difficulty sequencing, Requires verbal cues, Requires tactile cues          Exercises General Exercises - Lower Extremity Long Arc Quad: AROM, Strengthening, Right, 10 reps, Seated Hip Flexion/Marching: AROM, Left, Seated Amputee Exercises Quad Sets: AROM, Right, Left, 10 reps, Supine Hip Flexion/Marching: AROM, Strengthening, 10 reps, Supine, Both, Seated    General Comments General comments (skin integrity, edema, etc.): spouse present      Pertinent Vitals/Pain Pain Assessment Pain Assessment: Faces Faces Pain Scale: Hurts even more (with movement) Pain Location: left residual limb Pain Descriptors / Indicators: Discomfort, Grimacing, Guarding, Moaning, Operative site guarding Pain Intervention(s): Limited activity within patient's tolerance, Monitored during session, Repositioned    Home Living                          Prior Function            PT Goals (current goals can now be found in the care plan section) Acute Rehab PT Goals Patient Stated Goal: to tolerate mobility better Progress towards PT goals: Progressing toward goals    Frequency    Min 2X/week      PT Plan Current plan remains appropriate    Co-evaluation              AM-PAC PT  "6 Clicks" Mobility   Outcome Measure  Help needed turning from your back to your side while in a flat bed without using bedrails?: Total Help needed moving from lying on your back to sitting on the side of a flat bed without using bedrails?: Total Help needed moving to and from a bed to a chair (including a wheelchair)?: Total Help needed standing up from a chair using your arms (e.g., wheelchair or bedside chair)?: Total Help needed to walk in hospital room?: Total Help needed climbing 3-5 steps with a railing? : Total 6 Click Score: 6    End of Session Equipment Utilized During Treatment: Gait belt Activity Tolerance: Patient limited by fatigue Patient left: with call bell/phone within reach;with family/visitor present;in chair;with chair alarm set Nurse Communication: Mobility status;Need for lift equipment (Maximove) PT Visit Diagnosis: Unsteadiness on feet (R26.81);Other abnormalities of gait and mobility (R26.89);Muscle weakness (generalized) (M62.81);Difficulty in walking, not elsewhere classified (R26.2);Pain Pain - Right/Left: Left Pain - part of body: Leg     Time: 1130-1157 PT Time Calculation (min) (ACUTE ONLY): 27 min  Charges:  $Therapeutic Activity: 8-22 mins  [facility] M,PT Acute Rehab Services [phone]    Bevelyn Buckles 04/16/2022, 12:36 PM

## 2022-04-16 NOTE — Progress Notes (Signed)
Called to give report to [phone] with no answer.  Will try again.  Kenard Gower, RN

## 2022-04-16 NOTE — TOC Transition Note (Signed)
Transition of Care [facility]) - CM/SW Discharge Note   Patient Details  Name: Jill Munoz MRN: [id number] Date of Birth: 1951/09/05  [facility]) CM/SW Contact:  Eduard Roux, LCSW Phone Number: 04/16/2022, 2:11 PM   Clinical Narrative:     Patient will Discharge to: [facility] Discharge Date: 04/16/2022 Family Notified: spouse Transport By: Sharin Mons  Per MD patient is ready for discharge. RN, patient, and facility notified of discharge. Discharge Summary sent to facility. RN given number for [phone], Room 805-A. Ambulance transport requested for patient.   Clinical Social Worker signing off.  Antony Blackbird, MSW, LCSW Clinical Social Worker     Final next level of care: Skilled Nursing Facility Barriers to Discharge: Barriers Resolved   Patient Goals and CMS Choice Patient states their goals for this hospitalization and ongoing recovery are:: To receive rehab in the hospital CMS Medicare.gov Compare Post Acute Care list provided to:: Patient Represenative (must comment) Choice offered to / list presented to : Spouse  Discharge Placement              Patient chooses bed at: [facility] Patient to be transferred to facility by: [facility] Name of family member notified: spouse Patient and family notified of of transfer: 04/16/22  Discharge Plan and Services                                     Social Determinants of Health (SDOH) Interventions     Readmission Risk Interventions     No data to display

## 2022-04-16 NOTE — Progress Notes (Signed)
Mobility Specialist: Progress Note   04/16/22 1710  Mobility  Activity Transferred from chair to bed  Level of Assistance +2 (takes two people)  Assistive Device MaxiMove  RLE Weight Bearing WBAT  LLE Weight Bearing NWB  Activity Response Tolerated well  $Mobility charge 1 Mobility   Pt assisted back to bed per request. C/o LLE pain during transfer, no rating given. Pt back in the bed with NT present in the room.   [facility] Georges Victorio Mobility Specialist Mobility Specialist [address]: [phone]

## 2022-04-18 DIAGNOSIS — Z89612 Acquired absence of left leg above knee: Secondary | ICD-10-CM | POA: Diagnosis not present

## 2022-04-18 DIAGNOSIS — R531 Weakness: Secondary | ICD-10-CM | POA: Diagnosis not present

## 2022-04-18 DIAGNOSIS — R100 Acute abdomen: Secondary | ICD-10-CM | POA: Diagnosis not present

## 2022-04-18 DIAGNOSIS — M6259 Muscle wasting and atrophy, not elsewhere classified, multiple sites: Secondary | ICD-10-CM | POA: Diagnosis not present

## 2022-04-18 DIAGNOSIS — R5381 Other malaise: Secondary | ICD-10-CM | POA: Diagnosis not present

## 2022-04-18 DIAGNOSIS — M726 Necrotizing fasciitis: Secondary | ICD-10-CM | POA: Diagnosis not present

## 2022-04-18 DIAGNOSIS — R2681 Unsteadiness on feet: Secondary | ICD-10-CM | POA: Diagnosis not present

## 2022-04-19 DIAGNOSIS — K560 Paralytic ileus: Secondary | ICD-10-CM | POA: Diagnosis not present

## 2022-04-19 DIAGNOSIS — I5022 Chronic systolic (congestive) heart failure: Secondary | ICD-10-CM | POA: Diagnosis not present

## 2022-04-19 DIAGNOSIS — Z9189 Other specified personal risk factors, not elsewhere classified: Secondary | ICD-10-CM | POA: Diagnosis not present

## 2022-04-19 DIAGNOSIS — D649 Anemia, unspecified: Secondary | ICD-10-CM | POA: Diagnosis not present

## 2022-04-19 DIAGNOSIS — Z89612 Acquired absence of left leg above knee: Secondary | ICD-10-CM | POA: Diagnosis not present

## 2022-04-19 DIAGNOSIS — R7881 Bacteremia: Secondary | ICD-10-CM | POA: Diagnosis not present

## 2022-04-19 DIAGNOSIS — R1310 Dysphagia, unspecified: Secondary | ICD-10-CM | POA: Diagnosis not present

## 2022-04-19 DIAGNOSIS — Z741 Need for assistance with personal care: Secondary | ICD-10-CM | POA: Diagnosis not present

## 2022-04-19 DIAGNOSIS — B955 Unspecified streptococcus as the cause of diseases classified elsewhere: Secondary | ICD-10-CM | POA: Diagnosis not present

## 2022-04-19 DIAGNOSIS — M726 Necrotizing fasciitis: Secondary | ICD-10-CM | POA: Diagnosis not present

## 2022-04-19 DIAGNOSIS — E0841 Diabetes mellitus due to underlying condition with diabetic mononeuropathy: Secondary | ICD-10-CM | POA: Diagnosis not present

## 2022-04-19 DIAGNOSIS — F451 Undifferentiated somatoform disorder: Secondary | ICD-10-CM | POA: Diagnosis not present

## 2022-04-19 DIAGNOSIS — R030 Elevated blood-pressure reading, without diagnosis of hypertension: Secondary | ICD-10-CM | POA: Diagnosis not present

## 2022-04-19 DIAGNOSIS — R198 Other specified symptoms and signs involving the digestive system and abdomen: Secondary | ICD-10-CM | POA: Diagnosis not present

## 2022-04-19 DIAGNOSIS — E118 Type 2 diabetes mellitus with unspecified complications: Secondary | ICD-10-CM | POA: Diagnosis not present

## 2022-04-19 DIAGNOSIS — D509 Iron deficiency anemia, unspecified: Secondary | ICD-10-CM | POA: Diagnosis not present

## 2022-04-19 DIAGNOSIS — E639 Nutritional deficiency, unspecified: Secondary | ICD-10-CM | POA: Diagnosis not present

## 2022-04-23 DIAGNOSIS — M726 Necrotizing fasciitis: Secondary | ICD-10-CM | POA: Diagnosis not present

## 2022-04-23 DIAGNOSIS — F4321 Adjustment disorder with depressed mood: Secondary | ICD-10-CM | POA: Diagnosis not present

## 2022-04-23 DIAGNOSIS — R7881 Bacteremia: Secondary | ICD-10-CM | POA: Diagnosis not present

## 2022-04-23 DIAGNOSIS — M6259 Muscle wasting and atrophy, not elsewhere classified, multiple sites: Secondary | ICD-10-CM | POA: Diagnosis not present

## 2022-04-23 DIAGNOSIS — R5381 Other malaise: Secondary | ICD-10-CM | POA: Diagnosis not present

## 2022-04-23 DIAGNOSIS — Z89612 Acquired absence of left leg above knee: Secondary | ICD-10-CM | POA: Diagnosis not present

## 2022-04-23 DIAGNOSIS — F5101 Primary insomnia: Secondary | ICD-10-CM | POA: Diagnosis not present

## 2022-04-23 DIAGNOSIS — F418 Other specified anxiety disorders: Secondary | ICD-10-CM | POA: Diagnosis not present

## 2022-04-23 DIAGNOSIS — R2681 Unsteadiness on feet: Secondary | ICD-10-CM | POA: Diagnosis not present

## 2022-04-23 DIAGNOSIS — R100 Acute abdomen: Secondary | ICD-10-CM | POA: Diagnosis not present

## 2022-04-23 DIAGNOSIS — I5022 Chronic systolic (congestive) heart failure: Secondary | ICD-10-CM | POA: Diagnosis not present

## 2022-04-23 DIAGNOSIS — R531 Weakness: Secondary | ICD-10-CM | POA: Diagnosis not present

## 2022-04-23 DIAGNOSIS — K5900 Constipation, unspecified: Secondary | ICD-10-CM | POA: Diagnosis not present

## 2022-04-23 DIAGNOSIS — E118 Type 2 diabetes mellitus with unspecified complications: Secondary | ICD-10-CM | POA: Diagnosis not present

## 2022-04-24 DIAGNOSIS — D509 Iron deficiency anemia, unspecified: Secondary | ICD-10-CM | POA: Diagnosis not present

## 2022-04-24 DIAGNOSIS — E118 Type 2 diabetes mellitus with unspecified complications: Secondary | ICD-10-CM | POA: Diagnosis not present

## 2022-04-24 DIAGNOSIS — K5901 Slow transit constipation: Secondary | ICD-10-CM | POA: Diagnosis not present

## 2022-04-24 DIAGNOSIS — D75839 Thrombocytosis, unspecified: Secondary | ICD-10-CM | POA: Diagnosis not present

## 2022-04-24 DIAGNOSIS — E639 Nutritional deficiency, unspecified: Secondary | ICD-10-CM | POA: Diagnosis not present

## 2022-04-25 ENCOUNTER — Encounter: Payer: Self-pay | Admitting: Family

## 2022-04-25 ENCOUNTER — Ambulatory Visit (INDEPENDENT_AMBULATORY_CARE_PROVIDER_SITE_OTHER): Payer: Medicare Other | Admitting: Family

## 2022-04-25 DIAGNOSIS — R100 Acute abdomen: Secondary | ICD-10-CM | POA: Diagnosis not present

## 2022-04-25 DIAGNOSIS — R531 Weakness: Secondary | ICD-10-CM | POA: Diagnosis not present

## 2022-04-25 DIAGNOSIS — S78112A Complete traumatic amputation at level between left hip and knee, initial encounter: Secondary | ICD-10-CM

## 2022-04-25 DIAGNOSIS — R5381 Other malaise: Secondary | ICD-10-CM | POA: Diagnosis not present

## 2022-04-25 DIAGNOSIS — R2681 Unsteadiness on feet: Secondary | ICD-10-CM | POA: Diagnosis not present

## 2022-04-25 DIAGNOSIS — M6259 Muscle wasting and atrophy, not elsewhere classified, multiple sites: Secondary | ICD-10-CM | POA: Diagnosis not present

## 2022-04-25 DIAGNOSIS — M726 Necrotizing fasciitis: Secondary | ICD-10-CM | POA: Diagnosis not present

## 2022-04-25 DIAGNOSIS — Z89612 Acquired absence of left leg above knee: Secondary | ICD-10-CM | POA: Diagnosis not present

## 2022-04-25 NOTE — Progress Notes (Signed)
   Post-Op Visit Note   Patient: Jill Munoz           Date of Birth: 12/19/1950           MRN: [id number] Visit Date: 04/25/2022 PCP: Pcp, No  Chief Complaint: No chief complaint on file.   HPI:  HPI The patient is a 71 year old woman who is seen status post left above-knee amputation Ortho Exam On examination of the left residual limb sutures are in place.  This is healing well there is no gaping drainage no erythema  Visit Diagnoses: No diagnosis found.  Plan: Begin daily Dial soap cleansing.  Dry dressings.  Follow-up in the office in 2 weeks anticipate harvesting remaining sutures at that time.  Given an order for her prosthesis set up for [facility] today.  They voiced they may prefer to have her prosthesis fabricated elsewhere  Follow-Up Instructions: No follow-ups on file.   Imaging: No results found.  Orders:  No orders of the defined types were placed in this encounter.  No orders of the defined types were placed in this encounter.    PMFS History: Patient Active Problem List   Diagnosis Date Noted   Weakness    Hypokalemia 04/10/2022   Bacteremia due to Streptococcus Pyogenes 04/03/2022   Type 2 diabetes mellitus with hyperglycemia (HCC) 04/03/2022   Beta-hemolytic group A streptococcal sepsis (HCC)    Necrotizing fasciitis of lower leg (HCC) 04/02/2022   Hyponatremia 04/02/2022   Hypotension 04/02/2022   Past Medical History:  Diagnosis Date   Pre-diabetes     History reviewed. No pertinent family history.  Past Surgical History:  Procedure Laterality Date   AMPUTATION  04/06/2022   Procedure: AMPUTATION ABOVE KNEE REVISION;  Surgeon: [name], MD;  Location: [facility] OR;  Service: Orthopedics;;   I & D EXTREMITY Left 04/02/2022   Procedure: IRRIGATION AND DEBRIDEMENT LEFT LOWER LEG APPLICATION OF WOUND VAC ;  Surgeon: Ernestina Columbia, MD;  Location: [facility] OR;  Service: Orthopedics;  Laterality: Left;   I & D EXTREMITY Left 04/04/2022   Procedure:  LEFT ABOVE KNEE AMPUTATION;  Surgeon: [name], MD;  Location: [facility] OR;  Service: Orthopedics;  Laterality: Left;   I & D EXTREMITY Left 04/06/2022   Procedure: LEFT LEG DEBRIDEMENT;  Surgeon: [name], MD;  Location: [facility] OR;  Service: Orthopedics;  Laterality: Left;   STUMP REVISION Left 04/11/2022   Procedure: REVISION LEFT ABOVE KNEE AMPUTATION;  Surgeon: [name], MD;  Location: [facility] OR;  Service: Orthopedics;  Laterality: Left;   Social History   Occupational History   Not on file  Tobacco Use   Smoking status: Never   Smokeless tobacco: Never  Vaping Use   Vaping Use: Never used  Substance and Sexual Activity   Alcohol use: Never   Drug use: Never   Sexual activity: Not on file

## 2022-04-26 ENCOUNTER — Ambulatory Visit (INDEPENDENT_AMBULATORY_CARE_PROVIDER_SITE_OTHER): Payer: Medicare Other | Admitting: Internal Medicine

## 2022-04-26 ENCOUNTER — Encounter (HOSPITAL_COMMUNITY): Payer: Self-pay | Admitting: Emergency Medicine

## 2022-04-26 ENCOUNTER — Emergency Department (HOSPITAL_COMMUNITY)
Admission: EM | Admit: 2022-04-26 | Discharge: 2022-04-26 | Disposition: A | Discharge status: Skilled Nursing Facility | Payer: Medicare Other | Attending: Emergency Medicine | Admitting: Emergency Medicine

## 2022-04-26 ENCOUNTER — Emergency Department (HOSPITAL_COMMUNITY): Payer: Medicare Other

## 2022-04-26 ENCOUNTER — Other Ambulatory Visit: Payer: Self-pay

## 2022-04-26 ENCOUNTER — Encounter: Payer: Self-pay | Admitting: Internal Medicine

## 2022-04-26 VITALS — BP 175/78 | HR 74 | Temp 97.5°F

## 2022-04-26 DIAGNOSIS — R109 Unspecified abdominal pain: Secondary | ICD-10-CM | POA: Diagnosis not present

## 2022-04-26 DIAGNOSIS — A491 Streptococcal infection, unspecified site: Secondary | ICD-10-CM | POA: Diagnosis not present

## 2022-04-26 DIAGNOSIS — J189 Pneumonia, unspecified organism: Secondary | ICD-10-CM

## 2022-04-26 DIAGNOSIS — I2510 Atherosclerotic heart disease of native coronary artery without angina pectoris: Secondary | ICD-10-CM | POA: Diagnosis not present

## 2022-04-26 DIAGNOSIS — R0789 Other chest pain: Secondary | ICD-10-CM

## 2022-04-26 DIAGNOSIS — K449 Diaphragmatic hernia without obstruction or gangrene: Secondary | ICD-10-CM | POA: Diagnosis not present

## 2022-04-26 DIAGNOSIS — I700 Atherosclerosis of aorta: Secondary | ICD-10-CM | POA: Diagnosis not present

## 2022-04-26 DIAGNOSIS — R079 Chest pain, unspecified: Secondary | ICD-10-CM | POA: Insufficient documentation

## 2022-04-26 DIAGNOSIS — R1013 Epigastric pain: Secondary | ICD-10-CM | POA: Insufficient documentation

## 2022-04-26 DIAGNOSIS — J984 Other disorders of lung: Secondary | ICD-10-CM | POA: Diagnosis not present

## 2022-04-26 DIAGNOSIS — M726 Necrotizing fasciitis: Secondary | ICD-10-CM

## 2022-04-26 DIAGNOSIS — R918 Other nonspecific abnormal finding of lung field: Secondary | ICD-10-CM | POA: Diagnosis not present

## 2022-04-26 LAB — TROPONIN I (HIGH SENSITIVITY)
Troponin I (High Sensitivity): 8 ng/L (ref <18)
Troponin I (High Sensitivity): 8 ng/L (ref <18)

## 2022-04-26 LAB — CBC
HCT: 26.9 % — ABNORMAL LOW (ref 36.0–46.0)
Hemoglobin: 8.4 g/dL — ABNORMAL LOW (ref 12.0–15.0)
MCH: 27.9 pg (ref 26.0–34.0)
MCHC: 31.2 g/dL (ref 30.0–36.0)
MCV: 89.4 fL (ref 80.0–100.0)
Platelets: 661 10*3/uL — ABNORMAL HIGH (ref 150–400)
RBC: 3.01 MIL/uL — ABNORMAL LOW (ref 3.87–5.11)
RDW: 16.8 % — ABNORMAL HIGH (ref 11.5–15.5)
WBC: 7.8 10*3/uL (ref 4.0–10.5)
nRBC: 0 % (ref 0.0–0.2)

## 2022-04-26 LAB — HEPATIC FUNCTION PANEL
ALT: 12 U/L (ref 0–44)
AST: 23 U/L (ref 15–41)
Albumin: 1.9 g/dL — ABNORMAL LOW (ref 3.5–5.0)
Alkaline Phosphatase: 149 U/L — ABNORMAL HIGH (ref 38–126)
Bilirubin, Direct: 0.1 mg/dL (ref 0.0–0.2)
Indirect Bilirubin: 0.5 mg/dL (ref 0.3–0.9)
Total Bilirubin: 0.6 mg/dL (ref 0.3–1.2)
Total Protein: 7.1 g/dL (ref 6.5–8.1)

## 2022-04-26 LAB — BASIC METABOLIC PANEL
Anion gap: 10 (ref 5–15)
BUN: 14 mg/dL (ref 8–23)
CO2: 22 mmol/L (ref 22–32)
Calcium: 8.6 mg/dL — ABNORMAL LOW (ref 8.9–10.3)
Chloride: 103 mmol/L (ref 98–111)
Creatinine, Ser: 0.57 mg/dL (ref 0.44–1.00)
GFR, Estimated: >60 mL/min (ref >60)
Glucose, Bld: 207 mg/dL — ABNORMAL HIGH (ref 70–99)
Potassium: 4.2 mmol/L (ref 3.5–5.1)
Sodium: 135 mmol/L (ref 135–145)

## 2022-04-26 LAB — LIPASE, BLOOD: Lipase: 28 U/L (ref 11–51)

## 2022-04-26 MED ORDER — SODIUM CHLORIDE 0.9 % IV BOLUS
1000.0000 mL | Freq: Once | INTRAVENOUS | Status: AC
  Administered 2022-04-26: 1000 mL via INTRAVENOUS

## 2022-04-26 MED ORDER — IOHEXOL 350 MG/ML SOLN
100.0000 mL | Freq: Once | INTRAVENOUS | Status: AC | PRN
  Administered 2022-04-26: 100 mL via INTRAVENOUS

## 2022-04-26 MED ORDER — AZITHROMYCIN 250 MG PO TABS: ORAL_TABLET | ORAL | 0 refills | Status: DC

## 2022-04-26 NOTE — Progress Notes (Signed)
Transported patient to [facility] emergency department via wheelchair. Checked patient in at ED and took to triage area. Three of patient's family members were present and are staying with the patient. Patient was in no acute distress when RN left ED.   Sandie Ano, RN

## 2022-04-26 NOTE — Discharge Instructions (Addendum)
Your CT scan showed possible atypical pneumonia so I recommend a Z-Pak in addition to your current antibiotic  Your heart function test is normal  Your CT scan of your abdomen is normal.  See your doctor and your infectious disease doctor for follow-up  Return to ER if you have worse trouble breathing, chest pain, abdominal pain

## 2022-04-26 NOTE — Progress Notes (Signed)
[facility]  Patient Active Problem List   Diagnosis Date Noted   Weakness    Hypokalemia 04/10/2022   Bacteremia due to Streptococcus Pyogenes 04/03/2022   Type 2 diabetes mellitus with hyperglycemia (HCC) 04/03/2022   Beta-hemolytic group A streptococcal sepsis (HCC)    Necrotizing fasciitis of lower leg (HCC) 04/02/2022   Hyponatremia 04/02/2022   Hypotension 04/02/2022      Subjective:    Patient ID: Jill Munoz, female    DOB: 08-20-1951, 71 y.o.   MRN: [id number]  No chief complaint on file.   HPI:  Jill Munoz is a 71 y.o. female here for f/u nec fasc / gas infection   She had aka and debridements last one 6/28. She initially had linezolid/cefazolin abx combo and was switched to cefadroxil 2 more weeks on discharge to be taken from 6/28  She saw ortho yesterday who is happy with progress of wound healing  Minimal tenderness No f/c/n/v/diarrhea  She reports chest heaviness/epigastric discomfort. Unclear due to language barrier if pleuritic. No n, hands numbness/tingling. She minimally active given recent surgery/admission and in wheel chair  Eating doesn't make it worse  No hx heart attack  She does mention she has similar sx previously as well   No Known Allergies    Outpatient Medications Prior to Visit  Medication Sig Dispense Refill   acetaminophen (TYLENOL) 500 MG tablet Take 2 tablets (1,000 mg total) by mouth every 8 (eight) hours. 180 tablet 0   Ascorbic Acid (VITAMIN C) 1000 MG tablet Take 1,000 mg by mouth daily.     brimonidine (ALPHAGAN) 0.2 % ophthalmic solution Place 1 drop into both eyes 3 (three) times daily.     dorzolamide-timolol (COSOPT) 22.3-6.8 MG/ML ophthalmic solution Place 1 drop into both eyes 2 (two) times daily. 10 mL 12   ferrous sulfate 325 (65 FE) MG tablet Take 1 tablet (325 mg total) by mouth daily. 30 tablet 1   gabapentin (NEURONTIN) 100 MG capsule Take by mouth.     HUMALOG  100 UNIT/ML injection Inject into the skin.     lidocaine (XYLOCAINE) 2 % solution Use as directed 15 mLs in the mouth or throat every 4 (four) hours as needed for mouth pain. 15 mL 0   lidocaine (XYLOCAINE) 2 % solution Use as directed 15 mLs in the mouth or throat every 4 (four) hours as needed for mouth pain.  0   metoprolol tartrate (LOPRESSOR) 25 MG tablet Take 0.5 tablets (12.5 mg total) by mouth 2 (two) times daily.     oxyCODONE (OXY IR/ROXICODONE) 5 MG immediate release tablet Take 1 tablet (5 mg total) by mouth every 4 (four) hours as needed for severe pain or breakthrough pain. 15 tablet 0   pantoprazole (PROTONIX) 40 MG tablet Take 1 tablet (40 mg total) by mouth daily.     polyethylene glycol (MIRALAX / GLYCOLAX) 17 g packet Take 17 g by mouth daily. 14 each 0   polyethylene glycol (MIRALAX / GLYCOLAX) 17 g packet Take 17 g by mouth daily as needed for mild constipation or moderate constipation. 14 each 0   senna-docusate (SENOKOT-S) 8.6-50 MG tablet Take 2 tablets by mouth 2 (two) times daily.     senna-docusate (SENOKOT-S) 8.6-50 MG tablet Take 2 tablets by mouth at bedtime as needed for mild constipation.     traZODone (DESYREL) 50 MG tablet Take 1 tablet (50 mg total) by mouth at bedtime.  No facility-administered medications prior to visit.     Social History   Socioeconomic History   Marital status: Married    Spouse name: Not on file   Number of children: Not on file   Years of education: Not on file   Highest education level: Not on file  Occupational History   Not on file  Tobacco Use   Smoking status: Never   Smokeless tobacco: Never  Vaping Use   Vaping Use: Never used  Substance and Sexual Activity   Alcohol use: Never   Drug use: Never   Sexual activity: Not on file  Other Topics Concern   Not on file  Social History Narrative   Not on file   Social Determinants of Health   Financial Resource Strain: Not on file  Food Insecurity: Not on file   Transportation Needs: Not on file  Physical Activity: Not on file  Stress: Not on file  Social Connections: Not on file  Intimate Partner Violence: Not on file      Review of Systems     Objective:    BP (!) 175/78   Pulse 74   Temp (!) 97.5 F (36.4 C) (Oral)   SpO2 100%  Nursing note and vital signs reviewed.  Physical Exam     General/constitutional: no distress, pleasant, conversant HEENT: Normocephalic, PER, Conj Clear, EOMI, Oropharynx clear Neck supple CV: rrr no mrg Chest wall: no tenderness Lungs: clear to auscultation, normal respiratory effort Abd: Soft, Nontender Ext: no edema Skin: No Rash -- stump without dehiscence; medially there is some sutures still; the lateral sutures removed; no swelling/redness; mild tenderness Neuro: nonfocal    Labs:  Micro:  Serology:  Imaging:  Assessment & Plan:   Problem List Items Addressed This Visit       Other   Necrotizing fasciitis of lower leg (HCC)   Relevant Medications   cefadroxil (DURICEF) 500 MG capsule   Other Visit Diagnoses     Infection due to Streptococcus pyogenes    -  Primary   Relevant Medications   cefadroxil (DURICEF) 500 MG capsule   Chest pressure           71 yo female with GAS bacteremia dn lle nec fasc recent admission late 03/2022 here for f/u  She is s/p aka and I&D last on 6/28. She finished 2 weeks of cefadroxil from that date. Previously finish combo therapy with linezolid/cefazolin.  She had post-op ortho visit 04/25/22 with no wound dehiscence/intact sutures and good progress on wound healing  At this time her Brandt Loosen is well treated.   Continue wound care as per orthopedic surgery -- f/u with them 2 more weeks to see if suture removal needed  Precaution sign for stump infection to include increased pain, redness, swelling, discharge, or fever chill and to call our clinic as needed  Otherwise no need for f/u with our clinic at this time  Chest pain/pressure  -- need to r/o pe given recent admission/surgery and for age CAD event. Other consideration is dyspepsia/gi which she seems to allude to chronicity and can f/u with GI/pCP for this     No orders of the defined types were placed in this encounter.     Follow-up: Return if symptoms worsen or fail to improve.      Raymondo Band, MD [facility] 04/26/2022, 2:04 PM

## 2022-04-26 NOTE — Progress Notes (Signed)
Nursing facility called to confirm what hospital patient was sent [facility]. Staff faxed office noted to SNF at [phone] attn: [facility] RN unit manager Valarie Cones

## 2022-04-26 NOTE — ED Provider Triage Note (Signed)
Emergency Medicine Provider Triage Evaluation Note  Jill Munoz , a 71 y.o. female  was evaluated in triage.  Pt complains of chest heaviness onset several days.  Denies chest pain or shortness of breath.  No meds tried prior to arrival.  Denies past medical history of MI or cardiac catheterization.   Review of Systems  Positive: As per HPI Negative:   Physical Exam  BP (!) 169/81 (BP Location: Left Arm)   Pulse 80   Temp 98.2 F (36.8 C) (Oral)   Resp (!) 22   SpO2 100%  Gen:   Awake, no distress   Resp:  Normal effort  MSK:   Moves extremities without difficulty  Other:  No chest wall tenderness to palpation  Medical Decision Making  Medically screening exam initiated at 3:43 PM.  Appropriate orders placed.  Jill Munoz was informed that the remainder of the evaluation will be completed by another provider, this initial triage assessment does not replace that evaluation, and the importance of remaining in the ED until their evaluation is complete.  Work-up initiated   Jill Munoz A, PA-C 04/26/22 1552

## 2022-04-26 NOTE — Patient Instructions (Addendum)
You are doing well   Continue wound care per orthopedic surgery advice   If increased redness, pain, swelling, purulence discharge, or fever chill please call our ID office  Otherwise you do not need to make an appointment with [place] at this time   As you have chest heaviness, I would advise going to the emergency for them to rule out blood clot in the lungs or heart attack. I would do this within the next hour

## 2022-04-26 NOTE — ED Notes (Signed)
Attempted to call [facility].  Call was answered and forward multiple times.  Unable to given report

## 2022-04-26 NOTE — ED Provider Notes (Signed)
[facility] EMERGENCY DEPARTMENT Provider Note   CSN: [id number] Arrival date & time: 04/26/22  1449     History  Chief Complaint  Patient presents with   Chest Pain    Jill Munoz is a 71 y.o. 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  Patient is currently from a nursing home.  She states that she has been having some epigastric pain and also some chest pain.  Patient went to infectious disease doctor today and was sent in for CT PE to rule out PE and also rule out ACS.   The history is provided by the patient.       Home Medications Prior to Admission medications   Medication Sig Start Date End Date Taking? Authorizing Provider  acetaminophen (TYLENOL) 500 MG tablet Take 2 tablets (1,000 mg total) by mouth every 8 (eight) hours. 04/14/22 05/14/22  Morene Crocker, MD  Ascorbic Acid (VITAMIN C) 1000 MG tablet Take 1,000 mg by mouth daily.    [provider]  brimonidine (ALPHAGAN) 0.2 % ophthalmic solution Place 1 drop into both eyes 3 (three) times daily. 03/22/22   [provider]  cefadroxil (DURICEF) 500 MG capsule Take 500 mg by mouth 2 (two) times daily. 04/16/22   [provider]  dorzolamide-timolol (COSOPT) 22.3-6.8 MG/ML ophthalmic solution Place 1 drop into both eyes 2 (two) times daily. 04/14/22   Morene Crocker, MD  ferrous sulfate 325 (65 FE) MG tablet Take 1 tablet (325 mg total) by mouth daily. 04/14/22 04/14/23  Morene Crocker, MD  gabapentin (NEURONTIN) 100 MG capsule Take by mouth. 04/19/22   [provider]  HUMALOG 100 UNIT/ML injection Inject into the skin. 04/23/22   [provider]  Lactulose 20 GM/30ML SOLN Take 20 g by mouth daily.    [provider]  lidocaine (XYLOCAINE) 2 % solution Use as directed 15 mLs in the mouth or throat every 4 (four) hours as  needed for mouth pain. 04/14/22   Morene Crocker, MD  lidocaine (XYLOCAINE) 2 % solution Use as directed 15 mLs in the mouth or throat every 4 (four) hours as needed for mouth pain. 04/16/22   Morene Crocker, MD  melatonin 3 MG TABS tablet Take 3 mg by mouth at bedtime.    [provider]  metoprolol tartrate (LOPRESSOR) 25 MG tablet Take 0.5 tablets (12.5 mg total) by mouth 2 (two) times daily. 04/14/22   Morene Crocker, MD  oxyCODONE (OXY IR/ROXICODONE) 5 MG immediate release tablet Take 1 tablet (5 mg total) by mouth every 4 (four) hours as needed for severe pain or breakthrough pain. 04/16/22   Morene Crocker, MD  pantoprazole (PROTONIX) 40 MG tablet Take 1 tablet (40 mg total) by mouth daily. 04/14/22   Morene Crocker, MD  polyethylene glycol (MIRALAX / GLYCOLAX) 17 g packet Take 17 g by mouth daily. 04/14/22   Morene Crocker, MD  polyethylene glycol (MIRALAX / GLYCOLAX) 17 g packet Take 17 g by mouth daily as needed for mild constipation or moderate constipation. 04/16/22   Morene Crocker, MD  senna-docusate (SENOKOT-S) 8.6-50 MG tablet Take 2 tablets by mouth 2 (two) times daily. 04/14/22   Morene Crocker, MD  senna-docusate (SENOKOT-S) 8.6-50 MG tablet Take 2 tablets by mouth at bedtime as needed for mild constipation. 04/16/22   Morene Crocker, MD  traZODone (DESYREL) 50 MG tablet Take 1 tablet (50 mg total) by mouth at  bedtime. 04/14/22   Morene Crocker, MD      Allergies    Patient has no known allergies.    Review of Systems   Review of Systems  Cardiovascular:  Positive for chest pain.  All other systems reviewed and are negative.   Physical Exam Updated Vital Signs BP (!) 160/82 (BP Location: Left Arm)   Pulse 79   Temp 98.2 F (36.8 C) (Oral)   Resp 15   SpO2 100%  Physical Exam Vitals and nursing note reviewed.  Constitutional:      Comments: Chronically ill  HENT:     Head: Normocephalic.  Eyes:      Extraocular Movements: Extraocular movements intact.     Pupils: Pupils are equal, round, and reactive to light.  Cardiovascular:     Rate and Rhythm: Normal rate and regular rhythm.     Heart sounds: Normal heart sounds.  Pulmonary:     Effort: Pulmonary effort is normal.     Breath sounds: Normal breath sounds.  Abdominal:     Comments: Mildly distended and mild epigastric tenderness  Musculoskeletal:     Cervical back: Normal range of motion and neck supple.     Comments: Left AKA, dressing is intact and was seen by Ortho 2 days ago  Skin:    General: Skin is warm.     Capillary Refill: Capillary refill takes less than 2 seconds.  Neurological:     General: No focal deficit present.  Psychiatric:        Mood and Affect: Mood normal.        Behavior: Behavior normal. Behavior is not agitated.     ED Results / Procedures / Treatments   Labs (all labs ordered are listed, but only abnormal results are displayed) Labs Reviewed  BASIC METABOLIC PANEL - Abnormal; Notable for the following components:      Result Value   Glucose, Bld 207 (*)    Calcium 8.6 (*)    All other components within normal limits  CBC - Abnormal; Notable for the following components:   RBC 3.01 (*)    Hemoglobin 8.4 (*)    HCT 26.9 (*)    RDW 16.8 (*)    Platelets 661 (*)    All other components within normal limits  HEPATIC FUNCTION PANEL - Abnormal; Notable for the following components:   Albumin 1.9 (*)    Alkaline Phosphatase 149 (*)    All other components within normal limits  LIPASE, BLOOD  TROPONIN I (HIGH SENSITIVITY)  TROPONIN I (HIGH SENSITIVITY)    EKG EKG Interpretation  Date/Time:  Thursday April 26 2022 15:22:00 EDT Ventricular Rate:  80 PR Interval:  158 QRS Duration: 72 QT Interval:  368 QTC Calculation: 424 R Axis:   104 Text Interpretation: Normal sinus rhythm Rightward axis Borderline ECG When compared with ECG of 02-Apr-2022 11:50, No significant change since last  tracing Confirmed by Richardean Canal [phone]) on 04/26/2022 7:17:16 PM  Radiology CT Angio Chest PE W and/or Wo Contrast  Result Date: 04/26/2022 CLINICAL DATA:  Concern for pulmonary embolism. EXAM: CT ANGIOGRAPHY CHEST WITH CONTRAST TECHNIQUE: Multidetector CT imaging of the chest was performed using the standard protocol during bolus administration of intravenous contrast. Multiplanar CT image reconstructions and MIPs were obtained to evaluate the vascular anatomy. RADIATION DOSE REDUCTION: This exam was performed according to the departmental dose-optimization program which includes automated exposure control, adjustment of the mA and/or kV according to patient size and/or use of  iterative reconstruction technique. CONTRAST:  OMNIPAQUE IOHEXOL 350 MG/ML SOLN COMPARISON:  Chest radiograph dated 04/26/2022. FINDINGS: Cardiovascular: There is no cardiomegaly or pericardial effusion. Advanced coronary vascular calcification. Mild atherosclerotic calcification of the thoracic aorta. No aneurysmal dilatation. Evaluation of the pulmonary arteries is limited due to respiratory motion and suboptimal visualization of the peripheral branches. No central pulmonary artery embolus identified. Mediastinum/Nodes: No hilar adenopathy. There is a small hiatal hernia. Mild thickened appearance of the distal esophagus may be related to underdistention. Esophagitis is not excluded clinical correlation is recommended. No mediastinal fluid collection. Lungs/Pleura: No focal consolidation, pleural effusion, or pneumothorax. Scattered pulmonary nodules primarily involving the right lung most consistent with an infectious or inflammatory etiology. Clinical correlation is recommended. The central airways are patent. Upper Abdomen: No acute findings. Musculoskeletal: Osteopenia with degenerative changes of the spine. Age indeterminate T10-T12 compression fractures with anterior wedging as well as age indeterminate T8 compression  fracture. Correlation with clinical exam and point tenderness recommended. Review of the MIP images confirms the above findings. IMPRESSION: 1. No CT evidence of central pulmonary artery embolus. 2. Scattered pulmonary nodules primarily involving the right lung most consistent with an infectious or inflammatory etiology. Findings may represent an atypical infection or aspiration. 3. Age indeterminate T10-T12 compression fractures. Correlation with clinical exam and point tenderness recommended. 4. Aortic Atherosclerosis (ICD10-I70.0). Electronically Signed   By: Elgie Collard M.D.   On: 04/26/2022 21:18   CT ABDOMEN PELVIS W CONTRAST  Result Date: 04/26/2022 CLINICAL DATA:  Acute abdominal pain. EXAM: CT ABDOMEN AND PELVIS WITH CONTRAST TECHNIQUE: Multidetector CT imaging of the abdomen and pelvis was performed using the standard protocol following bolus administration of intravenous contrast. RADIATION DOSE REDUCTION: This exam was performed according to the departmental dose-optimization program which includes automated exposure control, adjustment of the mA and/or kV according to patient size and/or use of iterative reconstruction technique. CONTRAST:  OMNIPAQUE IOHEXOL 350 MG/ML SOLN COMPARISON:  None Available. FINDINGS: Lower Chest: No acute findings. Hepatobiliary:  No hepatic masses identified. Pancreas:  No mass or inflammatory changes. Spleen: Within normal limits in size and appearance. Adrenals/Urinary Tract: No masses identified. No evidence of ureteral calculi or hydronephrosis. Urinary bladder is distended, and urinary retention cannot be excluded. Stomach/Bowel: Small hiatal hernia is seen. No evidence of obstruction, inflammatory process or abnormal fluid collections. Normal appendix visualized. Diverticulosis is seen mainly involving the sigmoid colon, however there is no evidence of diverticulitis. Vascular/Lymphatic: No pathologically enlarged lymph nodes. No acute vascular findings.  Aortic atherosclerotic calcification incidentally noted. Reproductive:  No mass or other significant abnormality. Other:  None. Musculoskeletal: No suspicious bone lesions identified. Multiple vertebral body compression deformities are seen in the lower thoracic and upper lumbar spine which appear chronic. IMPRESSION: Distended urinary bladder; recommend clinical correlation for urinary retention. Colonic diverticulosis, without radiographic evidence of diverticulitis. Small hiatal hernia. Aortic Atherosclerosis (ICD10-I70.0). Electronically Signed   By: Danae Orleans M.D.   On: 04/26/2022 21:16   DG Chest 1 View  Result Date: 04/26/2022 CLINICAL DATA:  Chest heaviness EXAM: CHEST  1 VIEW COMPARISON:  04/02/2022 FINDINGS: Cardiac size is within normal limits. There are no signs of alveolar pulmonary edema. Small linear densities in right lower lung field may suggest scarring or subsegmental atelectasis. Rest of the lung fields are essentially clear. There is no pleural effusion or pneumothorax. IMPRESSION: Small linear densities in the right lower lung fields suggest scarring or subsegmental atelectasis. There are no signs of pulmonary edema or focal pulmonary consolidation.  Electronically Signed   By: Ernie Avena M.D.   On: 04/26/2022 16:38    Procedures Procedures    Medications Ordered in ED Medications  sodium chloride 0.9 % bolus 1,000 mL (0 mLs Intravenous Stopped 04/26/22 2135)  iohexol (OMNIPAQUE) 350 MG/ML injection 100 mL (100 mLs Intravenous Contrast Given 04/26/22 2043)    ED Course/ Medical Decision Making/ A&P                           Medical Decision Making Shakerria Parran is a 71 y.o. female here presenting with chest pain abdominal pain.  Patient is very vague about her symptoms.  Unclear how long its been going on for.  She just had necrotizing fasciitis had major surgery in the hospital so at risk for PE.  Also consider ileus versus obstruction as well.  Plan to get CBC  and CMP and CT PE and CT abdomen pelvis.  10 pm I reviewed patient's labs and independently interpreted imaging studies.  CT showed no PE but there is possible atypical infection.  Patient is already on Duricef.  We will add azithromycin.  Stable for discharge back to nursing home.   Problems Addressed: Atypical pneumonia: acute illness or injury  Amount and/or Complexity of Data Reviewed Labs: ordered. Decision-making details documented in ED Course. Radiology: ordered and independent interpretation performed. Decision-making details documented in ED Course. ECG/medicine tests: ordered and independent interpretation performed. Decision-making details documented in ED Course.  Risk Prescription drug management.    Final Clinical Impression(s) / ED Diagnoses Final diagnoses:  None    Rx / DC Orders ED Discharge Orders     None         Charlynne Pander, MD 04/26/22 2354

## 2022-04-26 NOTE — ED Triage Notes (Signed)
Patient sent to ED for evaluation of chest heaviness that started several days ago. Patient denies pain and shortness of breath. Patient is alert, oriented, and in no apparent distress at this time.

## 2022-05-01 DIAGNOSIS — K560 Paralytic ileus: Secondary | ICD-10-CM | POA: Diagnosis not present

## 2022-05-01 DIAGNOSIS — E118 Type 2 diabetes mellitus with unspecified complications: Secondary | ICD-10-CM | POA: Diagnosis not present

## 2022-05-01 DIAGNOSIS — E639 Nutritional deficiency, unspecified: Secondary | ICD-10-CM | POA: Diagnosis not present

## 2022-05-01 DIAGNOSIS — E0841 Diabetes mellitus due to underlying condition with diabetic mononeuropathy: Secondary | ICD-10-CM | POA: Diagnosis not present

## 2022-05-01 LAB — FUNGAL ORGANISM REFLEX

## 2022-05-01 LAB — FUNGUS CULTURE WITH STAIN

## 2022-05-01 LAB — FUNGUS CULTURE RESULT

## 2022-05-03 ENCOUNTER — Other Ambulatory Visit: Payer: Self-pay | Admitting: *Deleted

## 2022-05-03 ENCOUNTER — Ambulatory Visit (INDEPENDENT_AMBULATORY_CARE_PROVIDER_SITE_OTHER): Payer: Medicare Other | Admitting: Orthopedic Surgery

## 2022-05-03 DIAGNOSIS — Z89612 Acquired absence of left leg above knee: Secondary | ICD-10-CM

## 2022-05-03 DIAGNOSIS — S78112A Complete traumatic amputation at level between left hip and knee, initial encounter: Secondary | ICD-10-CM

## 2022-05-03 NOTE — Patient Outreach (Signed)
[facility] Post- Acute Care Coordinator follow up. Per [employer] eligible member currently resides in [facility].  Screening for potential [facility] care management/care coordination as a benefit of Mrs. Retail buyer and PCP with [employer].  Verified with Winona Legato that Mrs. Norem's PCP is Dr. Loralyn Freshwater at [facility]. Writer called Nurse, learning disability to confirm that Mrs. Senna will be establishing as a new patient with Dr. Legrand Como on July 28th at 84 am.   Met with Kathrynn Running, SNF social workers and Verdis Frederickson, Marketing executive to discuss transition plans and progression. Mrs. Mcfall will discharge from [facility] on Saturday, July 22nd. She will have home health with [place]. Winona Legato reports DME  has been ordered thru [employer]- wheelchair, hospital bed, transfer board, bedside commode, and shower chair. Winona Legato advised Probation officer to call husband.   Went to bedside to speak with Mrs. Topp. Sister was at bedside. Mrs. Bonillas states she is feeling a lot better. Discussed [facility] follow up. Mrs. Fishman requests writer to contact her husband to discuss further. Left [facility] Care Management brochure, writer's contact information, and 24-hr nurse advice line magnet at bedside.   Will plan outreach to Mrs. Foxworthy's huband.    Marthenia Rolling, MSN, RN,BSN [place] Acute Care Coordinator [phone] [facility]) [phone]  (Toll free office)

## 2022-05-04 ENCOUNTER — Other Ambulatory Visit: Payer: Self-pay | Admitting: *Deleted

## 2022-05-04 DIAGNOSIS — D75839 Thrombocytosis, unspecified: Secondary | ICD-10-CM | POA: Diagnosis not present

## 2022-05-04 DIAGNOSIS — R198 Other specified symptoms and signs involving the digestive system and abdomen: Secondary | ICD-10-CM | POA: Diagnosis not present

## 2022-05-04 DIAGNOSIS — Z89612 Acquired absence of left leg above knee: Secondary | ICD-10-CM | POA: Diagnosis not present

## 2022-05-04 DIAGNOSIS — M726 Necrotizing fasciitis: Secondary | ICD-10-CM | POA: Diagnosis not present

## 2022-05-04 DIAGNOSIS — D649 Anemia, unspecified: Secondary | ICD-10-CM | POA: Diagnosis not present

## 2022-05-04 DIAGNOSIS — I5022 Chronic systolic (congestive) heart failure: Secondary | ICD-10-CM | POA: Diagnosis not present

## 2022-05-04 DIAGNOSIS — E118 Type 2 diabetes mellitus with unspecified complications: Secondary | ICD-10-CM | POA: Diagnosis not present

## 2022-05-04 LAB — FUNGAL ORGANISM REFLEX

## 2022-05-04 LAB — FUNGUS CULTURE WITH STAIN

## 2022-05-04 LAB — FUNGUS CULTURE RESULT

## 2022-05-04 NOTE — Patient Outreach (Signed)
[facility] Post- Acute Care Coordinator follow up. Jill Munoz is slated to dc from [facility] on Saturday, July 22nd.   Telephone call made to [name], [name] husband at [phone] to discuss [facility] follow up. However, [name] states it is not a good time. Asked Clinical research associate to call back.   Discussed writer will call back on Monday.   Raiford Noble, MSN, RN,BSN [facility] Post Acute Care Coordinator [phone] [facility]) [phone]  (Toll free office)

## 2022-05-07 ENCOUNTER — Other Ambulatory Visit: Payer: Self-pay | Admitting: *Deleted

## 2022-05-07 DIAGNOSIS — E1141 Type 2 diabetes mellitus with diabetic mononeuropathy: Secondary | ICD-10-CM | POA: Diagnosis not present

## 2022-05-07 DIAGNOSIS — E559 Vitamin D deficiency, unspecified: Secondary | ICD-10-CM | POA: Diagnosis not present

## 2022-05-07 DIAGNOSIS — Z794 Long term (current) use of insulin: Secondary | ICD-10-CM | POA: Diagnosis not present

## 2022-05-07 DIAGNOSIS — M726 Necrotizing fasciitis: Secondary | ICD-10-CM | POA: Diagnosis not present

## 2022-05-07 DIAGNOSIS — N319 Neuromuscular dysfunction of bladder, unspecified: Secondary | ICD-10-CM | POA: Diagnosis not present

## 2022-05-07 DIAGNOSIS — R7881 Bacteremia: Secondary | ICD-10-CM | POA: Diagnosis not present

## 2022-05-07 DIAGNOSIS — R1310 Dysphagia, unspecified: Secondary | ICD-10-CM | POA: Diagnosis not present

## 2022-05-07 DIAGNOSIS — Z89612 Acquired absence of left leg above knee: Secondary | ICD-10-CM | POA: Diagnosis not present

## 2022-05-07 DIAGNOSIS — Z4781 Encounter for orthopedic aftercare following surgical amputation: Secondary | ICD-10-CM | POA: Diagnosis not present

## 2022-05-07 DIAGNOSIS — B955 Unspecified streptococcus as the cause of diseases classified elsewhere: Secondary | ICD-10-CM | POA: Diagnosis not present

## 2022-05-07 DIAGNOSIS — L03116 Cellulitis of left lower limb: Secondary | ICD-10-CM | POA: Diagnosis not present

## 2022-05-07 DIAGNOSIS — F451 Undifferentiated somatoform disorder: Secondary | ICD-10-CM | POA: Diagnosis not present

## 2022-05-07 DIAGNOSIS — I5022 Chronic systolic (congestive) heart failure: Secondary | ICD-10-CM | POA: Diagnosis not present

## 2022-05-07 DIAGNOSIS — D509 Iron deficiency anemia, unspecified: Secondary | ICD-10-CM | POA: Diagnosis not present

## 2022-05-07 NOTE — Patient Outreach (Signed)
[facility] Post-Acute Care Coordinator follow up. [facility] eligible member screened for potential [facility] Care Management services as a benefit of member's insurance plan.  Verified in [facility] Jill Munoz transitioned from [facility] on 05/05/22.   Telephone call made to spouse Jill Munoz [phone] to discuss [name] follow up. However, no answer. HIPAA compliant voicemail message left to request return call.    Raiford Noble, MSN, RN,BSN [facility] Post Acute Care Coordinator [phone] [facility]) [phone]  (Toll free office)

## 2022-05-08 ENCOUNTER — Other Ambulatory Visit: Payer: Self-pay | Admitting: *Deleted

## 2022-05-08 ENCOUNTER — Encounter: Payer: Self-pay | Admitting: Orthopedic Surgery

## 2022-05-08 DIAGNOSIS — E1141 Type 2 diabetes mellitus with diabetic mononeuropathy: Secondary | ICD-10-CM | POA: Diagnosis not present

## 2022-05-08 DIAGNOSIS — L03116 Cellulitis of left lower limb: Secondary | ICD-10-CM | POA: Diagnosis not present

## 2022-05-08 DIAGNOSIS — Z4781 Encounter for orthopedic aftercare following surgical amputation: Secondary | ICD-10-CM | POA: Diagnosis not present

## 2022-05-08 DIAGNOSIS — R7881 Bacteremia: Secondary | ICD-10-CM | POA: Diagnosis not present

## 2022-05-08 DIAGNOSIS — I5022 Chronic systolic (congestive) heart failure: Secondary | ICD-10-CM | POA: Diagnosis not present

## 2022-05-08 DIAGNOSIS — B955 Unspecified streptococcus as the cause of diseases classified elsewhere: Secondary | ICD-10-CM | POA: Diagnosis not present

## 2022-05-08 NOTE — Progress Notes (Addendum)
Office Visit Note   Patient: Kiasha Bellin           Date of Birth: 01-Apr-1951           MRN: [id number] Visit Date: 05/03/2022              Requested by: No referring provider defined for this encounter. PCP: Pcp, No  Chief Complaint  Patient presents with   Left Leg - Routine Post Op    04/11/2022 revision left AKA with Kerecis micro       HPI: Patient is 3 weeks status post left above-knee amputation with Kerecis micro graft.  Assessment & Plan: Visit Diagnoses:  1. Above knee amputation of left lower extremity (HCC)     Plan: Patient is planning for discharge to home from [facility].  A prescription was provided for [employer] for a above-the-knee prosthesis.  Follow-Up Instructions: Return in about 4 weeks (around 05/31/2022).   Ortho Exam  Patient is alert, oriented, no adenopathy, well-dressed, normal affect, normal respiratory effort. Examination the incision is well-healed there is no redness swelling or drainage there are a few remaining stitches that are removed.  Patient is a new left transfemoral amputee.  Patient's current comorbidities are not expected to impact the ability to function with the prescribed prosthesis. Patient verbally communicates a strong desire to use a prosthesis. Patient currently requires mobility aids to ambulate without a prosthesis.  Expects not to use mobility aids with a new prosthesis.  Patient is a new left transfemoral amputee.  Patient's current comorbidities are not expected to impact the ability to function with the prescribed prosthesis. Patient verbally communicates a strong desire to use a prosthesis. Patient currently requires mobility aids to ambulate without a prosthesis.  Expects not to use mobility aids with a new prosthesis.  Patient is a K2 level ambulator that will use a prosthesis to walk around their home and the community over low level environmental barriers.    This note was addended to reflect that a  microprocessor was never intended to be ordered for this patient.  It was part of a pre populated order set.  The current addended note reflects that patient is a K2 level ambulator and needs the prosthesis appropriate for this level of ambulation.        Imaging: No results found. No images are attached to the encounter.  Labs: Lab Results  Component Value Date   HGBA1C 11.0 (H) 04/02/2022   REPTSTATUS 04/09/2022 FINAL 04/04/2022   GRAMSTAIN NO WBC SEEN NO ORGANISMS SEEN  04/04/2022   CULT  04/04/2022    RARE GROUP A STREP (S.PYOGENES) ISOLATED Beta hemolytic streptococci are predictably susceptible to penicillin and other beta lactams. Susceptibility testing not routinely performed. NO ANAEROBES ISOLATED Performed at [facility], [address] [place], [place] [address]    [facility] STAPHYLOCOCCUS LUGDUNENSIS 04/02/2022     Lab Results  Component Value Date   ALBUMIN 1.9 (L) 04/26/2022   ALBUMIN 1.7 (L) 04/03/2022   ALBUMIN 2.1 (L) 04/02/2022    Lab Results  Component Value Date   MG 1.7 04/11/2022   MG 1.9 04/09/2022   MG 1.4 (L) 04/08/2022   No results found for: "VD25OH"  No results found for: "PREALBUMIN"    Latest Ref Rng & Units 04/26/2022    3:54 PM 04/16/2022    7:18 AM 04/14/2022    1:41 AM  CBC EXTENDED  WBC 4.0 - 10.5 K/uL 7.8  7.0  8.1  RBC 3.87 - 5.11 MIL/uL 3.01  2.92  2.66   Hemoglobin 12.0 - 15.0 g/dL 8.4  8.0  7.6   HCT 24.0 - 46.0 % 26.9  24.7  22.4   Platelets 150 - 400 K/uL 661  256  111      There is no height or weight on file to calculate BMI.  Orders:  No orders of the defined types were placed in this encounter.  No orders of the defined types were placed in this encounter.    Procedures: No procedures performed  Clinical Data: No additional findings.  ROS:  All other systems negative, except as noted in the HPI. Review of Systems  Objective: Vital Signs: There were no vitals taken for this  visit.  Specialty Comments:  No specialty comments available.  PMFS History: Patient Active Problem List   Diagnosis Date Noted   Weakness    Hypokalemia 04/10/2022   Bacteremia due to Streptococcus Pyogenes 04/03/2022   Type 2 diabetes mellitus with hyperglycemia (HCC) 04/03/2022   Beta-hemolytic group A streptococcal sepsis (HCC)    Necrotizing fasciitis of lower leg (HCC) 04/02/2022   Hyponatremia 04/02/2022   Hypotension 04/02/2022   Past Medical History:  Diagnosis Date   Pre-diabetes     History reviewed. No pertinent family history.  Past Surgical History:  Procedure Laterality Date   AMPUTATION  04/06/2022   Procedure: AMPUTATION ABOVE KNEE REVISION;  Surgeon: [name], MD;  Location: [facility] OR;  Service: Orthopedics;;   I & D EXTREMITY Left 04/02/2022   Procedure: IRRIGATION AND DEBRIDEMENT LEFT LOWER LEG APPLICATION OF WOUND VAC ;  Surgeon: Ernestina Columbia, MD;  Location: [facility] OR;  Service: Orthopedics;  Laterality: Left;   I & D EXTREMITY Left 04/04/2022   Procedure: LEFT ABOVE KNEE AMPUTATION;  Surgeon: [name], MD;  Location: [facility] OR;  Service: Orthopedics;  Laterality: Left;   I & D EXTREMITY Left 04/06/2022   Procedure: LEFT LEG DEBRIDEMENT;  Surgeon: [name], MD;  Location: [facility] OR;  Service: Orthopedics;  Laterality: Left;   STUMP REVISION Left 04/11/2022   Procedure: REVISION LEFT ABOVE KNEE AMPUTATION;  Surgeon: [name], MD;  Location: [facility] OR;  Service: Orthopedics;  Laterality: Left;   Social History   Occupational History   Not on file  Tobacco Use   Smoking status: Never   Smokeless tobacco: Never  Vaping Use   Vaping Use: Never used  Substance and Sexual Activity   Alcohol use: Never   Drug use: Never   Sexual activity: Not on file

## 2022-05-08 NOTE — Patient Outreach (Signed)
[facility] Post- Acute Care Coordinator follow up.   Jill Munoz discharged from [facility] on 05/05/22.   Third attempt to reach Jill Munoz spouse, per her request, at [phone], to discuss [facility] Care Management services. No answer, HIPAA compliant voicemail message left to request return call.   Contact information and [facility] Care Management brochure previously provided to Jill Munoz and family.   Will sign off due to inability to reach.    Raiford Noble, MSN, RN,BSN [facility] Post Acute Care Coordinator [phone] [facility]) [phone]  (Toll free office)

## 2022-05-11 ENCOUNTER — Ambulatory Visit (INDEPENDENT_AMBULATORY_CARE_PROVIDER_SITE_OTHER): Payer: Medicare Other | Admitting: Family Medicine

## 2022-05-11 ENCOUNTER — Other Ambulatory Visit: Payer: Self-pay | Admitting: *Deleted

## 2022-05-11 ENCOUNTER — Encounter: Payer: Self-pay | Admitting: Family Medicine

## 2022-05-11 VITALS — BP 140/84 | HR 95 | Temp 98.6°F

## 2022-05-11 DIAGNOSIS — M726 Necrotizing fasciitis: Secondary | ICD-10-CM | POA: Diagnosis not present

## 2022-05-11 DIAGNOSIS — E1165 Type 2 diabetes mellitus with hyperglycemia: Secondary | ICD-10-CM | POA: Diagnosis not present

## 2022-05-11 DIAGNOSIS — H538 Other visual disturbances: Secondary | ICD-10-CM

## 2022-05-11 DIAGNOSIS — Z89612 Acquired absence of left leg above knee: Secondary | ICD-10-CM | POA: Diagnosis not present

## 2022-05-11 DIAGNOSIS — E1159 Type 2 diabetes mellitus with other circulatory complications: Secondary | ICD-10-CM | POA: Diagnosis not present

## 2022-05-11 DIAGNOSIS — Z780 Asymptomatic menopausal state: Secondary | ICD-10-CM

## 2022-05-11 DIAGNOSIS — I152 Hypertension secondary to endocrine disorders: Secondary | ICD-10-CM

## 2022-05-11 DIAGNOSIS — K5901 Slow transit constipation: Secondary | ICD-10-CM

## 2022-05-11 DIAGNOSIS — B950 Streptococcus, group A, as the cause of diseases classified elsewhere: Secondary | ICD-10-CM | POA: Diagnosis not present

## 2022-05-11 DIAGNOSIS — D638 Anemia in other chronic diseases classified elsewhere: Secondary | ICD-10-CM | POA: Diagnosis not present

## 2022-05-11 DIAGNOSIS — M40204 Unspecified kyphosis, thoracic region: Secondary | ICD-10-CM | POA: Insufficient documentation

## 2022-05-11 DIAGNOSIS — K5900 Constipation, unspecified: Secondary | ICD-10-CM | POA: Insufficient documentation

## 2022-05-11 DIAGNOSIS — S22000A Wedge compression fracture of unspecified thoracic vertebra, initial encounter for closed fracture: Secondary | ICD-10-CM | POA: Insufficient documentation

## 2022-05-11 DIAGNOSIS — M4004 Postural kyphosis, thoracic region: Secondary | ICD-10-CM

## 2022-05-11 DIAGNOSIS — S22000D Wedge compression fracture of unspecified thoracic vertebra, subsequent encounter for fracture with routine healing: Secondary | ICD-10-CM | POA: Diagnosis not present

## 2022-05-11 LAB — POCT GLYCOSYLATED HEMOGLOBIN (HGB A1C): Hemoglobin A1C: 8.2 % — AB (ref 4.0–5.6)

## 2022-05-11 MED ORDER — SENNA-DOCUSATE SODIUM 8.6-50 MG PO TABS: 1.0000 | ORAL_TABLET | Freq: Every day | ORAL | 0 refills | Status: DC | PRN

## 2022-05-11 MED ORDER — DAPAGLIFLOZIN PROPANEDIOL 10 MG PO TABS: 10.0000 mg | ORAL_TABLET | Freq: Every day | ORAL | 1 refills | Status: DC

## 2022-05-11 NOTE — Patient Instructions (Signed)
Start the senokot as needed for the constipation.  Start Marcelline Deist once daily to help control your blood sugars.   Continue to work with the physical therapists.

## 2022-05-11 NOTE — Progress Notes (Signed)
Established Patient Office Visit  Subjective   Patient ID: Jill Munoz, female    DOB: 03/18/1951  Age: 71 y.o. MRN: [id number]  Chief Complaint  Patient presents with   Establish Care    Pt is here for follow up. Husband is with her in the visit today. He is reporting that she is having problems with constipation. [facility] and [facility] is making her feel uncomfortable and full in her abdomen. States she has to push and strain to have a BM.She is also complaining of back pain, states that her back is "curving" and it hurts on either side of her spine. Husband reports that he bought her a back brace and asked if this would help her.  The patient has an extensive history of diabetes. She recently was in the hospital  in June 2023 and had a left AKA due to necrotizing fasciitis. She went to a skilled nursing facility after her hospital stay to recover. I have extensively reviewed her documentation from the hospital stay and I reviewed her operative reports.   She is accompanied by her husband and brother in the visit. They report that she had only been seeing a naturopathic doctor and she has only been taking herbal supplements to control her blood sugars. I reviewed all of the herbal supplements she has brought to the visit (there was a large cardboard box full of herbal supplements). She has never had a mammogram or a colonoscopy, does not take vaccines, she declines all of these things today.     Current Outpatient Medications  Medication Instructions   Berberine Chloride 500 MG CAPS 1 capsule, Oral, 3 times daily   brimonidine (ALPHAGAN) 0.2 % ophthalmic solution 1 drop, Both Eyes, 3 times daily   dapagliflozin propanediol (FARXIGA) 10 mg, Oral, Daily before breakfast   dorzolamide-timolol (COSOPT) 22.3-6.8 MG/ML ophthalmic solution 1 drop, Both Eyes, 2 times daily   lidocaine (XYLOCAINE) 2 % solution 15 mLs, Mouth/Throat, Every 4 hours PRN    lidocaine (XYLOCAINE) 2 % solution 15 mLs, Mouth/Throat, Every 4 hours PRN   melatonin 3 mg, Oral, Daily at bedtime   sennosides-docusate sodium (SENOKOT-S) 8.6-50 MG tablet 1 tablet, Oral, Daily PRN   vitamin C 1,000 mg, Oral, Daily       Review of Systems  Constitutional:  Negative for chills and fever.  Eyes:  Positive for blurred vision and discharge.  Respiratory:  Negative for cough, sputum production and shortness of breath.   Gastrointestinal:  Positive for constipation. Negative for blood in stool.  Musculoskeletal:  Positive for back pain.  Neurological:  Positive for weakness.  Endo/Heme/Allergies:  Negative for polydipsia. Does not bruise/bleed easily.  Psychiatric/Behavioral:  Negative for depression.   All other systems reviewed and are negative.     Objective:     BP 140/84 (BP Location: Right Arm, Patient Position: Sitting, Cuff Size: Normal)   Pulse 95   Temp 98.6 F (37 C) (Oral)   SpO2 96%    Physical Exam Vitals reviewed.  Constitutional:      Appearance: Normal appearance. She is well-groomed and normal weight.  HENT:     Head: Normocephalic and atraumatic.     Mouth/Throat:     Mouth: Mucous membranes are moist.     Pharynx: Oropharynx is clear.  Eyes:     General:        Right eye: Discharge present.        Left  eye: Discharge present.    Extraocular Movements: Extraocular movements intact.     Conjunctiva/sclera: Conjunctivae normal.     Pupils: Pupils are equal, round, and reactive to light.     Comments: There is crusting on the eyelashes and tearing from both eyes  Cardiovascular:     Rate and Rhythm: Normal rate and regular rhythm.     Heart sounds: S1 normal and S2 normal. No murmur heard. Pulmonary:     Effort: Pulmonary effort is normal.     Breath sounds: Normal breath sounds and air entry. No wheezing or rhonchi.  Abdominal:     General: Abdomen is flat. Bowel sounds are normal.     Palpations: Abdomen is soft.  Musculoskeletal:         General: Normal range of motion.     Cervical back: Normal range of motion and neck supple.     Thoracic back: Deformity present. No signs of trauma or tenderness. Scoliosis present.     Lumbar back: Normal.     Right lower leg: No edema.       Legs:     Comments: Left AKA, the left thigh is normal in size without erythema or edema, there is a bandage over the surgical site that is clean and dry.  Skin:    General: Skin is warm and dry.  Neurological:     Mental Status: She is alert and oriented to person, place, and time. Mental status is at baseline.     Gait: Gait is intact.  Psychiatric:        Mood and Affect: Mood and affect normal.        Speech: Speech normal.        Behavior: Behavior normal.        Judgment: Judgment normal.      Results for orders placed or performed in visit on 05/11/22  POC HgB A1c  Result Value Ref Range   Hemoglobin A1C 8.2 (A) 4.0 - 5.6 %   HbA1c POC (<> result, manual entry)     HbA1c, POC (prediabetic range)     HbA1c, POC (controlled diabetic range)      Last CBC Lab Results  Component Value Date   WBC 7.8 04/26/2022   HGB 8.4 (L) 04/26/2022   HCT 26.9 (L) 04/26/2022   MCV 89.4 04/26/2022   MCH 27.9 04/26/2022   RDW 16.8 (H) 04/26/2022   PLT 661 (H) 04/26/2022   Last metabolic panel Lab Results  Component Value Date   GLUCOSE 207 (H) 04/26/2022   NA 135 04/26/2022   K 4.2 04/26/2022   CL 103 04/26/2022   CO2 22 04/26/2022   BUN 14 04/26/2022   CREATININE 0.57 04/26/2022   GFRNONAA >60 04/26/2022   CALCIUM 8.6 (L) 04/26/2022   PROT 7.1 04/26/2022   ALBUMIN 1.9 (L) 04/26/2022   BILITOT 0.6 04/26/2022   ALKPHOS 149 (H) 04/26/2022   AST 23 04/26/2022   ALT 12 04/26/2022   ANIONGAP 10 04/26/2022   CT Angio Chest PE W and/or Wo Contrast CLINICAL DATA:  Concern for pulmonary embolism.  EXAM: CT ANGIOGRAPHY CHEST WITH CONTRAST  TECHNIQUE: Multidetector CT imaging of the chest was performed using the standard  protocol during bolus administration of intravenous contrast. Multiplanar CT image reconstructions and MIPs were obtained to evaluate the vascular anatomy.  RADIATION DOSE REDUCTION: This exam was performed according to the departmental dose-optimization program which includes automated exposure control, adjustment of the mA and/or kV according to  patient size and/or use of iterative reconstruction technique.  CONTRAST:  OMNIPAQUE IOHEXOL 350 MG/ML SOLN  COMPARISON:  Chest radiograph dated 04/26/2022.  FINDINGS: Cardiovascular: There is no cardiomegaly or pericardial effusion. Advanced coronary vascular calcification. Mild atherosclerotic calcification of the thoracic aorta. No aneurysmal dilatation. Evaluation of the pulmonary arteries is limited due to respiratory motion and suboptimal visualization of the peripheral branches. No central pulmonary artery embolus identified.  Mediastinum/Nodes: No hilar adenopathy. There is a small hiatal hernia. Mild thickened appearance of the distal esophagus may be related to underdistention. Esophagitis is not excluded clinical correlation is recommended. No mediastinal fluid collection.  Lungs/Pleura: No focal consolidation, pleural effusion, or pneumothorax. Scattered pulmonary nodules primarily involving the right lung most consistent with an infectious or inflammatory etiology. Clinical correlation is recommended. The central airways are patent.  Upper Abdomen: No acute findings.  Musculoskeletal: Osteopenia with degenerative changes of the spine. Age indeterminate T10-T12 compression fractures with anterior wedging as well as age indeterminate T8 compression fracture. Correlation with clinical exam and point tenderness recommended.  Review of the MIP images confirms the above findings.  IMPRESSION: 1. No CT evidence of central pulmonary artery embolus. 2. Scattered pulmonary nodules primarily involving the right lung most  consistent with an infectious or inflammatory etiology. Findings may represent an atypical infection or aspiration. 3. Age indeterminate T10-T12 compression fractures. Correlation with clinical exam and point tenderness recommended. 4. Aortic Atherosclerosis (ICD10-I70.0).  Electronically Signed   By: Elgie Collard M.D.   On: 04/26/2022 21:18 CT ABDOMEN PELVIS W CONTRAST CLINICAL DATA:  Acute abdominal pain.  EXAM: CT ABDOMEN AND PELVIS WITH CONTRAST  TECHNIQUE: Multidetector CT imaging of the abdomen and pelvis was performed using the standard protocol following bolus administration of intravenous contrast.  RADIATION DOSE REDUCTION: This exam was performed according to the departmental dose-optimization program which includes automated exposure control, adjustment of the mA and/or kV according to patient size and/or use of iterative reconstruction technique.  CONTRAST:  OMNIPAQUE IOHEXOL 350 MG/ML SOLN  COMPARISON:  None Available.  FINDINGS: Lower Chest: No acute findings.  Hepatobiliary:  No hepatic masses identified.  Pancreas:  No mass or inflammatory changes.  Spleen: Within normal limits in size and appearance.  Adrenals/Urinary Tract: No masses identified. No evidence of ureteral calculi or hydronephrosis. Urinary bladder is distended, and urinary retention cannot be excluded.  Stomach/Bowel: Small hiatal hernia is seen. No evidence of obstruction, inflammatory process or abnormal fluid collections. Normal appendix visualized. Diverticulosis is seen mainly involving the sigmoid colon, however there is no evidence of diverticulitis.  Vascular/Lymphatic: No pathologically enlarged lymph nodes. No acute vascular findings. Aortic atherosclerotic calcification incidentally noted.  Reproductive:  No mass or other significant abnormality.  Other:  None.  Musculoskeletal: No suspicious bone lesions identified. Multiple vertebral body compression  deformities are seen in the lower thoracic and upper lumbar spine which appear chronic.  IMPRESSION: Distended urinary bladder; recommend clinical correlation for urinary retention.  Colonic diverticulosis, without radiographic evidence of diverticulitis.  Small hiatal hernia.  Aortic Atherosclerosis (ICD10-I70.0).  Electronically Signed   By: Danae Orleans M.D.   On: 04/26/2022 21:16 DG Chest 1 View CLINICAL DATA:  Chest heaviness  EXAM: CHEST  1 VIEW  COMPARISON:  04/02/2022  FINDINGS: Cardiac size is within normal limits. There are no signs of alveolar pulmonary edema. Small linear densities in right lower lung field may suggest scarring or subsegmental atelectasis. Rest of the lung fields are essentially clear. There is no pleural effusion or pneumothorax.  IMPRESSION: Small linear densities in the right lower lung fields suggest scarring or subsegmental atelectasis. There are no signs of pulmonary edema or focal pulmonary consolidation.  Electronically Signed   By: Ernie Avena M.D.   On: 04/26/2022 16:38   The ASCVD Risk score (Arnett DK, et al., 2019) failed to calculate for the following reasons:   The valid HDL cholesterol range is 20 to 100 mg/dL   The valid total cholesterol range is 130 to 320 mg/dL    Assessment & Plan:   Problem List Items Addressed This Visit       Cardiovascular and Mediastinum   Hypertension associated with diabetes (HCC) (Chronic)    BP today is 140 systolic, she was hypotensive in hospital so meds were not started. She has never seen an allopathic physician before. Since this is only 1 pressure I will continue to monitor her BP trends at each visit. RTC in 3 months for follow up.      Relevant Medications   dapagliflozin propanediol (FARXIGA) 10 MG TABS tablet     Endocrine   Type 2 diabetes mellitus with hyperglycemia (HCC) - Primary    Uncontrolled A1C today, she has been taking berberine at home and other herbal  supplements that were given to her by a naturopathic practitioner. I explained the difference between naturopaths and allopathic doctors and I recommended that we start Farxiga 10 mg daily to help control her blood sugars better. Patient is agreeable to starting this medication. She is also reporting blurry vision so I will refer her to ophthalmology for her eye exam. Patient has declined all vaccines today. Will continue to see the patient every 3 months in office until her medical problems are under good control      Relevant Medications   dapagliflozin propanediol (FARXIGA) 10 MG TABS tablet   Other Relevant Orders   POC HgB A1c (Completed)     Musculoskeletal and Integument   Kyphosis of thoracic region (Chronic)    Patient is having significant pain in her thoracic spine. This is most likely due to osteoporosis and the age-indeterminate compression fractures at T10-12 that were seen on a recent CT of her chest. She needs a DEXA scan to confirm a diagnosis of osteoporosis which is likely the cause of the compression fractures. I encouraged her husband to use the back brace that he has obtained for her and to let the physical therapists know to work with her on back strengthening exercises.      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 No signs of infection noted on exam today.      Amputee, above knee, left (HCC)   Constipation    Likely secondary to inability to ambulate after having left AKA and SNF stay. I recommended senokot S daily as needed to help with BM's . I also encouraged her trying to increase physical activity with her home PT as this will encourage her bowel to move more frequently.      Relevant Medications   sennosides-docusate sodium (SENOKOT-S) 8.6-50 MG tablet   Blurry vision, bilateral   Relevant Orders   Ambulatory  referral to Ophthalmology   Anemia of chronic disease (Chronic)    Last Hb was 8.4, this was after a prolonged hospital stay due to severe infection during which she required amputation of her left leg for  necrotizing fasciitis. I will check iron and TIBC today to see if she needs iron replacement and to better characterize the nature of the anemia.       Relevant Orders   Iron and TIBC   Other Visit Diagnoses     Postmenopausal state       Relevant Orders   DG Bone Density      I have spent over 60 minutes on this new patient encounter reviewing her hospital discharge documentation, medications, imaging studies,operative reports, specialists notes, labs, etc. I also extensively reviewed the herbal medications she brought in and educated her and her husband on them.  Return in about 3 months (around 08/11/2022) for diabetes follow up - repeat A1C.    Karie Georges, MD

## 2022-05-11 NOTE — Patient Outreach (Signed)
Late entry for 05/10/22 [facility] Post-Acute Care Coordinator follow up.   Telephone call received from Mr. Creedon husband on 05/10/22. Patient identifiers confirmed.   Explained [facility] Care Management services.  Dr. Katy Fitch (spouse) endorses home health ([facility]) has been visiting Mrs. Vieau. Has scheduled new patient appointment scheduled for Friday, July 28th with Dr. Casimiro Needle with [facility]. Dr. Katy Fitch denies having any [facility] follow up needs. States he has writer's contact information to call if needed.    Raiford Noble, MSN, RN,BSN [facility] Post Acute Care Coordinator [phone] [facility]) [phone]  (Toll free office)

## 2022-05-11 NOTE — Assessment & Plan Note (Signed)
Patient is having significant pain in her thoracic spine. This is most likely due to osteoporosis and the age-indeterminate compression fractures at T10-12 that were seen on a recent CT of her chest. She needs a DEXA scan to confirm a diagnosis of osteoporosis which is likely the cause of the compression fractures. I encouraged her husband to use the back brace that he has obtained for her and to let the physical therapists know to work with her on back strengthening exercises.

## 2022-05-11 NOTE — Assessment & Plan Note (Signed)
Resolved s/p left AKA and extended hospitalization. No signs of infection noted on exam today.

## 2022-05-11 NOTE — Assessment & Plan Note (Signed)
BP today is 140 systolic, she was hypotensive in hospital so meds were not started. She has never seen an allopathic physician before. Since this is only 1 pressure I will continue to monitor her BP trends at each visit. RTC in 3 months for follow up.

## 2022-05-11 NOTE — Assessment & Plan Note (Signed)
Likely secondary to inability to ambulate after having left AKA and SNF stay. I recommended senokot S daily as needed to help with BM's . I also encouraged her trying to increase physical activity with her home PT as this will encourage her bowel to move more frequently.

## 2022-05-11 NOTE — Assessment & Plan Note (Signed)
Last Hb was 8.4, this was after a prolonged hospital stay due to severe infection during which she required amputation of her left leg for necrotizing fasciitis. I will check iron and TIBC today to see if she needs iron replacement and to better characterize the nature of the anemia.

## 2022-05-11 NOTE — Assessment & Plan Note (Signed)
Uncontrolled A1C today, she has been taking berberine at home and other herbal supplements that were given to her by a naturopathic practitioner. I explained the difference between naturopaths and allopathic doctors and I recommended that we start Farxiga 10 mg daily to help control her blood sugars better. Patient is agreeable to starting this medication. She is also reporting blurry vision so I will refer her to ophthalmology for her eye exam. Patient has declined all vaccines today. Will continue to see the patient every 3 months in office until her medical problems are under good control

## 2022-05-12 LAB — IRON AND TIBC
Iron Saturation: 14 % — ABNORMAL LOW (ref 15–55)
Iron: 39 ug/dL (ref 27–139)
Total Iron Binding Capacity: 270 ug/dL (ref 250–450)
UIBC: 231 ug/dL (ref 118–369)

## 2022-05-14 ENCOUNTER — Other Ambulatory Visit: Payer: Self-pay | Admitting: Family Medicine

## 2022-05-14 ENCOUNTER — Telehealth: Payer: Self-pay | Admitting: Family Medicine

## 2022-05-14 DIAGNOSIS — E1165 Type 2 diabetes mellitus with hyperglycemia: Secondary | ICD-10-CM

## 2022-05-14 MED ORDER — EMPAGLIFLOZIN 10 MG PO TABS: 10.0000 mg | ORAL_TABLET | Freq: Every day | ORAL | 1 refills | Status: DC

## 2022-05-14 NOTE — Telephone Encounter (Signed)
Pt states the dapagliflozin propanediol (FARXIGA) 10 MG TABS tablet  is expensive and they are seeking more cost effective alternative.

## 2022-05-14 NOTE — Progress Notes (Signed)
I communicated to Dr. Renold Don, who had seen patient in clinic. Thank you!

## 2022-05-14 NOTE — Telephone Encounter (Signed)
Spoke with the patient's husband and informed him of the message below.  ?

## 2022-05-15 ENCOUNTER — Telehealth: Payer: Self-pay | Admitting: Family Medicine

## 2022-05-15 DIAGNOSIS — B955 Unspecified streptococcus as the cause of diseases classified elsewhere: Secondary | ICD-10-CM | POA: Diagnosis not present

## 2022-05-15 DIAGNOSIS — R7881 Bacteremia: Secondary | ICD-10-CM | POA: Diagnosis not present

## 2022-05-15 DIAGNOSIS — I5022 Chronic systolic (congestive) heart failure: Secondary | ICD-10-CM | POA: Diagnosis not present

## 2022-05-15 DIAGNOSIS — E1141 Type 2 diabetes mellitus with diabetic mononeuropathy: Secondary | ICD-10-CM | POA: Diagnosis not present

## 2022-05-15 DIAGNOSIS — L03116 Cellulitis of left lower limb: Secondary | ICD-10-CM | POA: Diagnosis not present

## 2022-05-15 DIAGNOSIS — Z4781 Encounter for orthopedic aftercare following surgical amputation: Secondary | ICD-10-CM | POA: Diagnosis not present

## 2022-05-15 NOTE — Telephone Encounter (Signed)
Ok to give verbal order.

## 2022-05-15 NOTE — Telephone Encounter (Signed)
Physical therapy for home health 1-2x8 pls call and confirm

## 2022-05-15 NOTE — Telephone Encounter (Signed)
Spoke with Dorian and informed him of the approval for orders as below.

## 2022-05-18 DIAGNOSIS — R7881 Bacteremia: Secondary | ICD-10-CM | POA: Diagnosis not present

## 2022-05-18 DIAGNOSIS — B955 Unspecified streptococcus as the cause of diseases classified elsewhere: Secondary | ICD-10-CM | POA: Diagnosis not present

## 2022-05-18 DIAGNOSIS — Z4781 Encounter for orthopedic aftercare following surgical amputation: Secondary | ICD-10-CM | POA: Diagnosis not present

## 2022-05-18 DIAGNOSIS — E1141 Type 2 diabetes mellitus with diabetic mononeuropathy: Secondary | ICD-10-CM | POA: Diagnosis not present

## 2022-05-18 DIAGNOSIS — I5022 Chronic systolic (congestive) heart failure: Secondary | ICD-10-CM | POA: Diagnosis not present

## 2022-05-18 DIAGNOSIS — L03116 Cellulitis of left lower limb: Secondary | ICD-10-CM | POA: Diagnosis not present

## 2022-05-21 ENCOUNTER — Telehealth: Payer: Self-pay | Admitting: Family Medicine

## 2022-05-21 DIAGNOSIS — Z4781 Encounter for orthopedic aftercare following surgical amputation: Secondary | ICD-10-CM | POA: Diagnosis not present

## 2022-05-21 DIAGNOSIS — R7881 Bacteremia: Secondary | ICD-10-CM | POA: Diagnosis not present

## 2022-05-21 DIAGNOSIS — I5022 Chronic systolic (congestive) heart failure: Secondary | ICD-10-CM | POA: Diagnosis not present

## 2022-05-21 DIAGNOSIS — N898 Other specified noninflammatory disorders of vagina: Secondary | ICD-10-CM

## 2022-05-21 DIAGNOSIS — L03116 Cellulitis of left lower limb: Secondary | ICD-10-CM | POA: Diagnosis not present

## 2022-05-21 DIAGNOSIS — B955 Unspecified streptococcus as the cause of diseases classified elsewhere: Secondary | ICD-10-CM | POA: Diagnosis not present

## 2022-05-21 DIAGNOSIS — E1141 Type 2 diabetes mellitus with diabetic mononeuropathy: Secondary | ICD-10-CM | POA: Diagnosis not present

## 2022-05-21 NOTE — Telephone Encounter (Signed)
Pt complaining of vaginal discharge x 2d. Seeking guidance, Declined OV

## 2022-05-22 MED ORDER — FLUCONAZOLE 150 MG PO TABS: ORAL_TABLET | ORAL | 0 refills | Status: DC

## 2022-05-22 NOTE — Telephone Encounter (Signed)
Ok I will send in diflucan for her-- this is likely due to the Jardiance I placed her on for her blood sugar

## 2022-05-22 NOTE — Telephone Encounter (Signed)
Spoke with the patient's husband and informed him of the message below.  ?

## 2022-05-23 ENCOUNTER — Encounter: Payer: Self-pay | Admitting: Family Medicine

## 2022-05-23 ENCOUNTER — Telehealth: Payer: Self-pay | Admitting: Family Medicine

## 2022-05-23 ENCOUNTER — Telehealth (INDEPENDENT_AMBULATORY_CARE_PROVIDER_SITE_OTHER): Payer: Medicare Other | Admitting: Family Medicine

## 2022-05-23 DIAGNOSIS — K5909 Other constipation: Secondary | ICD-10-CM

## 2022-05-23 DIAGNOSIS — E1165 Type 2 diabetes mellitus with hyperglycemia: Secondary | ICD-10-CM

## 2022-05-23 NOTE — Telephone Encounter (Signed)
Pt's spouse called at 3:15pm on 05/23/22 to say they do not have transportation for an in office visit.  Pt asked why they cannot do virtual or phone visit?  Please advise.

## 2022-05-23 NOTE — Progress Notes (Signed)
Virtual Visit via Telephone Note  I connected with Jill Munoz on 05/23/22 at  3:30 PM EDT by telephone and verified that I am speaking with the correct person using two identifiers.   I discussed the limitations, risks, security and privacy concerns of performing an evaluation and management service by telephone and the availability of in person appointments. I also discussed with the patient that there may be a patient responsible charge related to this service. The patient expressed understanding and agreed to proceed.  Location patient: home Location provider: work or home office Participants present for the call: patient's husband, pt in background, provider Patient did not have a visit in the prior 7 days to address this/these issue(s).  In person visit was advised, however transportation was a limiting factor in today's appt.  Chief Complaint  Patient presents with   Constipation    Ongoing issue, discussed with PCP, took meds and still nothing     History of Present Illness: Pt with persistent constipation per pt's husband.  Senokot worked for a day or 2 but stopped.  Pt tried an enema with some hard stools expelled 5 days ago.  Pt has not had a BM in 4-5 days.  Eating toast with butter and eggs for breakfast, beef or lamb kabob for lunch, and cheese, bread, cucumber, herbs and a few teaspoons of olive oil for dinner.  Has honey qod to help with nausea.  Pt with gas and bloating.  Takes Gas X frequently.     Concerns for elevated blood sugar despite new medication.  Rx for Jardiance 10 mg daily written on 05/14/2022.  BS was 108 fasting then 161 after having honey this am.    Observations/Objective: Patient sounds cheerful and well on the phone. I do not appreciate any SOB. Speech and thought processing are grossly intact. Patient reported vitals:  Assessment and Plan: Chronic constipation -Discussed the limitations of telephone visit for current symptoms.  -Advised  impaction possible -Continue current medications.  Can use MiraLAX twice daily, then decrease as needed -Advised symptoms likely multifactorial including recent pain medication, and decreased ambulation, etc. -Given strict ED precautions.  Type 2 diabetes mellitus with hyperglycemia, without long-term current use of insulin (HCC) -Hemoglobin A1c 8.2% on 05/11/2022 -Advised to continue Jardiance 10 mg daily -Advised timing likely contributing to elevation in BS -Discussed the importance of lifestyle modifications   Follow Up Instructions:  Follow-up in the next few weeks with PCP  [id number] [id number] [id number] 11-20 [id number] 21-30 I did not refer this patient for an OV in the next 24 hours for this/these issue(s).  I discussed the assessment and treatment plan with the patient. The patient was provided an opportunity to ask questions and all were answered. The patient agreed with the plan and demonstrated an understanding of the instructions.   The patient was advised to call back or seek an in-person evaluation if the symptoms worsen or if the condition fails to improve as anticipated.  I provided 16 minutes of non-face-to-face time during this encounter.   Jill Saint, MD

## 2022-05-24 NOTE — Telephone Encounter (Signed)
Well I wanted to recheck her blood pressure and her A1C in the office in October-- if they have home health I can ask the home health nurse to draw an A1C and get her vitals -- then we can do a virtual visit.

## 2022-05-24 NOTE — Telephone Encounter (Signed)
Left a message for Asgar to return my call .

## 2022-05-25 DIAGNOSIS — B955 Unspecified streptococcus as the cause of diseases classified elsewhere: Secondary | ICD-10-CM | POA: Diagnosis not present

## 2022-05-25 DIAGNOSIS — R7881 Bacteremia: Secondary | ICD-10-CM | POA: Diagnosis not present

## 2022-05-25 DIAGNOSIS — Z4781 Encounter for orthopedic aftercare following surgical amputation: Secondary | ICD-10-CM | POA: Diagnosis not present

## 2022-05-25 DIAGNOSIS — I5022 Chronic systolic (congestive) heart failure: Secondary | ICD-10-CM | POA: Diagnosis not present

## 2022-05-25 DIAGNOSIS — L03116 Cellulitis of left lower limb: Secondary | ICD-10-CM | POA: Diagnosis not present

## 2022-05-25 DIAGNOSIS — E1141 Type 2 diabetes mellitus with diabetic mononeuropathy: Secondary | ICD-10-CM | POA: Diagnosis not present

## 2022-05-25 NOTE — Telephone Encounter (Signed)
Spoke with the patient's husband and informed him of the message below.  Jill Munoz stated the patient already has an appt in October and they have a way to get to the appt.  I scheduled a visit for 10/30 as no appt was previously scheduled.

## 2022-05-28 DIAGNOSIS — I5022 Chronic systolic (congestive) heart failure: Secondary | ICD-10-CM | POA: Diagnosis not present

## 2022-05-28 DIAGNOSIS — R7881 Bacteremia: Secondary | ICD-10-CM | POA: Diagnosis not present

## 2022-05-28 DIAGNOSIS — B955 Unspecified streptococcus as the cause of diseases classified elsewhere: Secondary | ICD-10-CM | POA: Diagnosis not present

## 2022-05-28 DIAGNOSIS — Z4781 Encounter for orthopedic aftercare following surgical amputation: Secondary | ICD-10-CM | POA: Diagnosis not present

## 2022-05-28 DIAGNOSIS — E1141 Type 2 diabetes mellitus with diabetic mononeuropathy: Secondary | ICD-10-CM | POA: Diagnosis not present

## 2022-05-28 DIAGNOSIS — L03116 Cellulitis of left lower limb: Secondary | ICD-10-CM | POA: Diagnosis not present

## 2022-05-29 DIAGNOSIS — B955 Unspecified streptococcus as the cause of diseases classified elsewhere: Secondary | ICD-10-CM | POA: Diagnosis not present

## 2022-05-29 DIAGNOSIS — E1141 Type 2 diabetes mellitus with diabetic mononeuropathy: Secondary | ICD-10-CM | POA: Diagnosis not present

## 2022-05-29 DIAGNOSIS — L03116 Cellulitis of left lower limb: Secondary | ICD-10-CM | POA: Diagnosis not present

## 2022-05-29 DIAGNOSIS — Z4781 Encounter for orthopedic aftercare following surgical amputation: Secondary | ICD-10-CM | POA: Diagnosis not present

## 2022-05-29 DIAGNOSIS — R7881 Bacteremia: Secondary | ICD-10-CM | POA: Diagnosis not present

## 2022-05-29 DIAGNOSIS — I5022 Chronic systolic (congestive) heart failure: Secondary | ICD-10-CM | POA: Diagnosis not present

## 2022-06-01 DIAGNOSIS — L03116 Cellulitis of left lower limb: Secondary | ICD-10-CM | POA: Diagnosis not present

## 2022-06-01 DIAGNOSIS — Z4781 Encounter for orthopedic aftercare following surgical amputation: Secondary | ICD-10-CM | POA: Diagnosis not present

## 2022-06-01 DIAGNOSIS — E1141 Type 2 diabetes mellitus with diabetic mononeuropathy: Secondary | ICD-10-CM | POA: Diagnosis not present

## 2022-06-01 DIAGNOSIS — I5022 Chronic systolic (congestive) heart failure: Secondary | ICD-10-CM | POA: Diagnosis not present

## 2022-06-01 DIAGNOSIS — B955 Unspecified streptococcus as the cause of diseases classified elsewhere: Secondary | ICD-10-CM | POA: Diagnosis not present

## 2022-06-01 DIAGNOSIS — R7881 Bacteremia: Secondary | ICD-10-CM | POA: Diagnosis not present

## 2022-06-04 DIAGNOSIS — L03116 Cellulitis of left lower limb: Secondary | ICD-10-CM | POA: Diagnosis not present

## 2022-06-04 DIAGNOSIS — R7881 Bacteremia: Secondary | ICD-10-CM | POA: Diagnosis not present

## 2022-06-04 DIAGNOSIS — Z4781 Encounter for orthopedic aftercare following surgical amputation: Secondary | ICD-10-CM | POA: Diagnosis not present

## 2022-06-04 DIAGNOSIS — I5022 Chronic systolic (congestive) heart failure: Secondary | ICD-10-CM | POA: Diagnosis not present

## 2022-06-04 DIAGNOSIS — B955 Unspecified streptococcus as the cause of diseases classified elsewhere: Secondary | ICD-10-CM | POA: Diagnosis not present

## 2022-06-04 DIAGNOSIS — E1141 Type 2 diabetes mellitus with diabetic mononeuropathy: Secondary | ICD-10-CM | POA: Diagnosis not present

## 2022-06-05 ENCOUNTER — Telehealth: Payer: Self-pay | Admitting: Family Medicine

## 2022-06-05 DIAGNOSIS — I5022 Chronic systolic (congestive) heart failure: Secondary | ICD-10-CM | POA: Diagnosis not present

## 2022-06-05 DIAGNOSIS — B955 Unspecified streptococcus as the cause of diseases classified elsewhere: Secondary | ICD-10-CM | POA: Diagnosis not present

## 2022-06-05 DIAGNOSIS — R7881 Bacteremia: Secondary | ICD-10-CM | POA: Diagnosis not present

## 2022-06-05 DIAGNOSIS — E1141 Type 2 diabetes mellitus with diabetic mononeuropathy: Secondary | ICD-10-CM | POA: Diagnosis not present

## 2022-06-05 DIAGNOSIS — Z4781 Encounter for orthopedic aftercare following surgical amputation: Secondary | ICD-10-CM | POA: Diagnosis not present

## 2022-06-05 DIAGNOSIS — L03116 Cellulitis of left lower limb: Secondary | ICD-10-CM | POA: Diagnosis not present

## 2022-06-05 NOTE — Telephone Encounter (Signed)
Pt's spouse called to ask MD if there is a generic brand for the [place]?  Please advise. [phone]

## 2022-06-05 NOTE — Telephone Encounter (Signed)
Spoke with the patient's husband and informed him Marcelline Deist is not available in a generic.  I offered the send a message to the PCP if needed and he declined.

## 2022-06-06 DIAGNOSIS — Z4781 Encounter for orthopedic aftercare following surgical amputation: Secondary | ICD-10-CM | POA: Diagnosis not present

## 2022-06-06 DIAGNOSIS — D509 Iron deficiency anemia, unspecified: Secondary | ICD-10-CM | POA: Diagnosis not present

## 2022-06-06 DIAGNOSIS — B955 Unspecified streptococcus as the cause of diseases classified elsewhere: Secondary | ICD-10-CM | POA: Diagnosis not present

## 2022-06-06 DIAGNOSIS — Z89612 Acquired absence of left leg above knee: Secondary | ICD-10-CM | POA: Diagnosis not present

## 2022-06-06 DIAGNOSIS — F451 Undifferentiated somatoform disorder: Secondary | ICD-10-CM | POA: Diagnosis not present

## 2022-06-06 DIAGNOSIS — M726 Necrotizing fasciitis: Secondary | ICD-10-CM | POA: Diagnosis not present

## 2022-06-06 DIAGNOSIS — Z794 Long term (current) use of insulin: Secondary | ICD-10-CM | POA: Diagnosis not present

## 2022-06-06 DIAGNOSIS — R7881 Bacteremia: Secondary | ICD-10-CM | POA: Diagnosis not present

## 2022-06-06 DIAGNOSIS — N319 Neuromuscular dysfunction of bladder, unspecified: Secondary | ICD-10-CM | POA: Diagnosis not present

## 2022-06-06 DIAGNOSIS — L03116 Cellulitis of left lower limb: Secondary | ICD-10-CM | POA: Diagnosis not present

## 2022-06-06 DIAGNOSIS — E559 Vitamin D deficiency, unspecified: Secondary | ICD-10-CM | POA: Diagnosis not present

## 2022-06-06 DIAGNOSIS — R1310 Dysphagia, unspecified: Secondary | ICD-10-CM | POA: Diagnosis not present

## 2022-06-06 DIAGNOSIS — I5022 Chronic systolic (congestive) heart failure: Secondary | ICD-10-CM | POA: Diagnosis not present

## 2022-06-06 DIAGNOSIS — E1141 Type 2 diabetes mellitus with diabetic mononeuropathy: Secondary | ICD-10-CM | POA: Diagnosis not present

## 2022-06-08 ENCOUNTER — Encounter: Payer: Self-pay | Admitting: Family

## 2022-06-08 ENCOUNTER — Ambulatory Visit (INDEPENDENT_AMBULATORY_CARE_PROVIDER_SITE_OTHER): Payer: Medicare Other | Admitting: Family

## 2022-06-08 DIAGNOSIS — S78112A Complete traumatic amputation at level between left hip and knee, initial encounter: Secondary | ICD-10-CM

## 2022-06-08 DIAGNOSIS — Z89612 Acquired absence of left leg above knee: Secondary | ICD-10-CM

## 2022-06-08 NOTE — Progress Notes (Signed)
Office Visit Note   Patient: Jill Munoz           Date of Birth: 1951/03/06           MRN: [id number] Visit Date: 06/08/2022              Requested by: Karie Georges, MD [address] 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  Plan: Proceed with prosthesis set up.  She will follow-up in the office as needed. Follow-Up Instructions: No follow-ups on file.   Ortho Exam  Patient is alert, oriented, no adenopathy, well-dressed, normal affect, normal respiratory effort. On examination of the left above-knee amputation this is well consolidated well-healed there is no gaping no drainage no erythema no warmth  Imaging: No results found. No images are attached to the encounter.  Labs: Lab Results  Component Value Date   HGBA1C 8.2 (A) 05/11/2022   HGBA1C 11.0 (H) 04/02/2022   REPTSTATUS 04/09/2022 FINAL 04/04/2022   GRAMSTAIN NO WBC SEEN NO ORGANISMS SEEN  04/04/2022   CULT  04/04/2022    RARE GROUP A STREP (S.PYOGENES) ISOLATED Beta hemolytic streptococci are predictably susceptible to penicillin and other beta lactams. Susceptibility testing not routinely performed. NO ANAEROBES ISOLATED Performed at [facility], [address], [place], [place] [address]    [facility] STAPHYLOCOCCUS LUGDUNENSIS 04/02/2022     Lab Results  Component Value Date   ALBUMIN 1.9 (L) 04/26/2022   ALBUMIN 1.7 (L) 04/03/2022   ALBUMIN 2.1 (L) 04/02/2022    Lab Results  Component Value Date   MG 1.7 04/11/2022   MG 1.9 04/09/2022   MG 1.4 (L) 04/08/2022   No results found for: "VD25OH"  No results found for:  "PREALBUMIN"    Latest Ref Rng & Units 04/26/2022    3:54 PM 04/16/2022    7:18 AM 04/14/2022    1:41 AM  CBC EXTENDED  WBC 4.0 - 10.5 K/uL 7.8  7.0  8.1   RBC 3.87 - 5.11 MIL/uL 3.01  2.92  2.66   Hemoglobin 12.0 - 15.0 g/dL 8.4  8.0  7.6   HCT 40.8 - 46.0 % 26.9  24.7  22.4   Platelets 150 - 400 K/uL 661  256  111      There is no height or weight on file to calculate BMI.  Orders:  No orders of the defined types were placed in this encounter.  No orders of the defined types were placed in this encounter.    Procedures: No procedures performed  Clinical Data: No additional findings.  ROS:  All other systems negative, except as noted in the HPI. Review of Systems  Objective: Vital Signs: There were no vitals taken for this visit.  Specialty Comments:  No specialty comments available.  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  Past Surgical History:  Procedure Laterality Date   AMPUTATION  04/06/2022   Procedure: AMPUTATION ABOVE KNEE REVISION;  Surgeon: [name], MD;  Location: [facility] OR;  Service: Orthopedics;;   I & D EXTREMITY Left 04/02/2022   Procedure: IRRIGATION AND DEBRIDEMENT LEFT LOWER LEG APPLICATION OF WOUND VAC ;  Surgeon:  Ernestina Columbia, MD;  Location: [facility] OR;  Service: Orthopedics;  Laterality: Left;   I & D EXTREMITY Left 04/04/2022   Procedure: LEFT ABOVE KNEE AMPUTATION;  Surgeon: [name], MD;  Location: [facility] OR;  Service: Orthopedics;  Laterality: Left;   I & D EXTREMITY Left 04/06/2022   Procedure: LEFT LEG DEBRIDEMENT;  Surgeon: [name], MD;  Location: [facility] OR;  Service: Orthopedics;  Laterality: Left;   STUMP REVISION Left 04/11/2022   Procedure: REVISION LEFT ABOVE KNEE AMPUTATION;  Surgeon: [name], MD;  Location: [facility] OR;  Service: Orthopedics;  Laterality: Left;   Social History   Occupational History   Not on file  Tobacco Use   Smoking status: Never   Smokeless tobacco: Never  Vaping Use   Vaping Use: Never used  Substance and Sexual Activity   Alcohol use: Never   Drug use: Never   Sexual activity: Not on file

## 2022-06-11 DIAGNOSIS — E1141 Type 2 diabetes mellitus with diabetic mononeuropathy: Secondary | ICD-10-CM | POA: Diagnosis not present

## 2022-06-11 DIAGNOSIS — Z4781 Encounter for orthopedic aftercare following surgical amputation: Secondary | ICD-10-CM | POA: Diagnosis not present

## 2022-06-11 DIAGNOSIS — L03116 Cellulitis of left lower limb: Secondary | ICD-10-CM | POA: Diagnosis not present

## 2022-06-11 DIAGNOSIS — B955 Unspecified streptococcus as the cause of diseases classified elsewhere: Secondary | ICD-10-CM | POA: Diagnosis not present

## 2022-06-11 DIAGNOSIS — I5022 Chronic systolic (congestive) heart failure: Secondary | ICD-10-CM | POA: Diagnosis not present

## 2022-06-11 DIAGNOSIS — R7881 Bacteremia: Secondary | ICD-10-CM | POA: Diagnosis not present

## 2022-06-12 ENCOUNTER — Telehealth: Payer: Self-pay | Admitting: Orthopedic Surgery

## 2022-06-12 NOTE — Telephone Encounter (Signed)
Patient called in asking if Dr. Lajoyce Corners completed the test [facility]) form, and is she still going to get the prosthetic they talked about so she can continue her activities.

## 2022-06-14 DIAGNOSIS — E113511 Type 2 diabetes mellitus with proliferative diabetic retinopathy with macular edema, right eye: Secondary | ICD-10-CM | POA: Diagnosis not present

## 2022-06-15 NOTE — Telephone Encounter (Signed)
Per both Erin and Dr. Lajoyce Corners, we do not have these forms and we do not perform this kind of testing here. Pt was told this her last two appointments in office. She was given her prosthetic Rx when she last saw Dr.Duda.

## 2022-06-15 NOTE — Telephone Encounter (Signed)
I SW pt's husband. He is informed about the testing form that we cannot fill this out. He was talking about the level of her prosthesis and I explained to him that she was marked as a K2 level. Anything higher than that is for ambulating on uneven terrain, hiking, exercising, etc. He said they want her to be marked as a k3 level due to the type of prosthesis they are wanting her to have. Will save message for Dr. Lajoyce Corners when he returns in office next week.

## 2022-06-16 DIAGNOSIS — I5022 Chronic systolic (congestive) heart failure: Secondary | ICD-10-CM | POA: Diagnosis not present

## 2022-06-16 DIAGNOSIS — Z4781 Encounter for orthopedic aftercare following surgical amputation: Secondary | ICD-10-CM | POA: Diagnosis not present

## 2022-06-16 DIAGNOSIS — B955 Unspecified streptococcus as the cause of diseases classified elsewhere: Secondary | ICD-10-CM | POA: Diagnosis not present

## 2022-06-16 DIAGNOSIS — L03116 Cellulitis of left lower limb: Secondary | ICD-10-CM | POA: Diagnosis not present

## 2022-06-16 DIAGNOSIS — E1141 Type 2 diabetes mellitus with diabetic mononeuropathy: Secondary | ICD-10-CM | POA: Diagnosis not present

## 2022-06-16 DIAGNOSIS — R7881 Bacteremia: Secondary | ICD-10-CM | POA: Diagnosis not present

## 2022-06-19 NOTE — Telephone Encounter (Signed)
Rx written and note updated, will fax into [facility]

## 2022-06-20 DIAGNOSIS — Z4781 Encounter for orthopedic aftercare following surgical amputation: Secondary | ICD-10-CM | POA: Diagnosis not present

## 2022-06-20 DIAGNOSIS — B955 Unspecified streptococcus as the cause of diseases classified elsewhere: Secondary | ICD-10-CM | POA: Diagnosis not present

## 2022-06-20 DIAGNOSIS — L03116 Cellulitis of left lower limb: Secondary | ICD-10-CM | POA: Diagnosis not present

## 2022-06-20 DIAGNOSIS — I5022 Chronic systolic (congestive) heart failure: Secondary | ICD-10-CM | POA: Diagnosis not present

## 2022-06-20 DIAGNOSIS — E1141 Type 2 diabetes mellitus with diabetic mononeuropathy: Secondary | ICD-10-CM | POA: Diagnosis not present

## 2022-06-20 DIAGNOSIS — R7881 Bacteremia: Secondary | ICD-10-CM | POA: Diagnosis not present

## 2022-06-21 DIAGNOSIS — I5022 Chronic systolic (congestive) heart failure: Secondary | ICD-10-CM | POA: Diagnosis not present

## 2022-06-21 DIAGNOSIS — R7881 Bacteremia: Secondary | ICD-10-CM | POA: Diagnosis not present

## 2022-06-21 DIAGNOSIS — L03116 Cellulitis of left lower limb: Secondary | ICD-10-CM | POA: Diagnosis not present

## 2022-06-21 DIAGNOSIS — E1141 Type 2 diabetes mellitus with diabetic mononeuropathy: Secondary | ICD-10-CM | POA: Diagnosis not present

## 2022-06-21 DIAGNOSIS — Z4781 Encounter for orthopedic aftercare following surgical amputation: Secondary | ICD-10-CM | POA: Diagnosis not present

## 2022-06-21 DIAGNOSIS — B955 Unspecified streptococcus as the cause of diseases classified elsewhere: Secondary | ICD-10-CM | POA: Diagnosis not present

## 2022-06-25 DIAGNOSIS — I5022 Chronic systolic (congestive) heart failure: Secondary | ICD-10-CM | POA: Diagnosis not present

## 2022-06-25 DIAGNOSIS — L03116 Cellulitis of left lower limb: Secondary | ICD-10-CM | POA: Diagnosis not present

## 2022-06-25 DIAGNOSIS — E1141 Type 2 diabetes mellitus with diabetic mononeuropathy: Secondary | ICD-10-CM | POA: Diagnosis not present

## 2022-06-25 DIAGNOSIS — Z4781 Encounter for orthopedic aftercare following surgical amputation: Secondary | ICD-10-CM | POA: Diagnosis not present

## 2022-06-25 DIAGNOSIS — B955 Unspecified streptococcus as the cause of diseases classified elsewhere: Secondary | ICD-10-CM | POA: Diagnosis not present

## 2022-06-25 DIAGNOSIS — R7881 Bacteremia: Secondary | ICD-10-CM | POA: Diagnosis not present

## 2022-06-29 DIAGNOSIS — H4053X4 Glaucoma secondary to other eye disorders, bilateral, indeterminate stage: Secondary | ICD-10-CM | POA: Diagnosis not present

## 2022-07-04 DIAGNOSIS — E1141 Type 2 diabetes mellitus with diabetic mononeuropathy: Secondary | ICD-10-CM | POA: Diagnosis not present

## 2022-07-04 DIAGNOSIS — B955 Unspecified streptococcus as the cause of diseases classified elsewhere: Secondary | ICD-10-CM | POA: Diagnosis not present

## 2022-07-04 DIAGNOSIS — R7881 Bacteremia: Secondary | ICD-10-CM | POA: Diagnosis not present

## 2022-07-04 DIAGNOSIS — L03116 Cellulitis of left lower limb: Secondary | ICD-10-CM | POA: Diagnosis not present

## 2022-07-04 DIAGNOSIS — Z4781 Encounter for orthopedic aftercare following surgical amputation: Secondary | ICD-10-CM | POA: Diagnosis not present

## 2022-07-04 DIAGNOSIS — I5022 Chronic systolic (congestive) heart failure: Secondary | ICD-10-CM | POA: Diagnosis not present

## 2022-07-05 ENCOUNTER — Telehealth: Payer: Self-pay | Admitting: Family Medicine

## 2022-07-05 DIAGNOSIS — Z4781 Encounter for orthopedic aftercare following surgical amputation: Secondary | ICD-10-CM | POA: Diagnosis not present

## 2022-07-05 DIAGNOSIS — E1141 Type 2 diabetes mellitus with diabetic mononeuropathy: Secondary | ICD-10-CM | POA: Diagnosis not present

## 2022-07-05 DIAGNOSIS — I5022 Chronic systolic (congestive) heart failure: Secondary | ICD-10-CM | POA: Diagnosis not present

## 2022-07-05 DIAGNOSIS — B955 Unspecified streptococcus as the cause of diseases classified elsewhere: Secondary | ICD-10-CM | POA: Diagnosis not present

## 2022-07-05 DIAGNOSIS — R7881 Bacteremia: Secondary | ICD-10-CM | POA: Diagnosis not present

## 2022-07-05 DIAGNOSIS — Z89612 Acquired absence of left leg above knee: Secondary | ICD-10-CM

## 2022-07-05 DIAGNOSIS — L03116 Cellulitis of left lower limb: Secondary | ICD-10-CM | POA: Diagnosis not present

## 2022-07-05 NOTE — Telephone Encounter (Signed)
Clair Gulling OT is calling and pt needs a pediatric rolling walker . Please fax order to [facility]

## 2022-07-06 NOTE — Telephone Encounter (Signed)
Yes

## 2022-07-06 NOTE — Telephone Encounter (Signed)
Orders have been placed in Epic and community message has been sent.

## 2022-07-19 DIAGNOSIS — E113511 Type 2 diabetes mellitus with proliferative diabetic retinopathy with macular edema, right eye: Secondary | ICD-10-CM | POA: Diagnosis not present

## 2022-08-13 ENCOUNTER — Ambulatory Visit: Payer: Medicare Other | Admitting: Family Medicine

## 2022-08-14 ENCOUNTER — Ambulatory Visit: Payer: Medicare Other | Admitting: Family Medicine

## 2022-08-20 ENCOUNTER — Ambulatory Visit: Payer: Medicare Other | Admitting: Family Medicine

## 2022-08-21 ENCOUNTER — Encounter: Payer: Self-pay | Admitting: Family Medicine

## 2022-08-21 ENCOUNTER — Ambulatory Visit (INDEPENDENT_AMBULATORY_CARE_PROVIDER_SITE_OTHER): Payer: Medicare Other | Admitting: Family Medicine

## 2022-08-21 VITALS — BP 112/70 | HR 80 | Temp 96.5°F

## 2022-08-21 DIAGNOSIS — K219 Gastro-esophageal reflux disease without esophagitis: Secondary | ICD-10-CM

## 2022-08-21 DIAGNOSIS — E1165 Type 2 diabetes mellitus with hyperglycemia: Secondary | ICD-10-CM | POA: Diagnosis not present

## 2022-08-21 DIAGNOSIS — L659 Nonscarring hair loss, unspecified: Secondary | ICD-10-CM

## 2022-08-21 DIAGNOSIS — K5901 Slow transit constipation: Secondary | ICD-10-CM

## 2022-08-21 DIAGNOSIS — K5909 Other constipation: Secondary | ICD-10-CM | POA: Diagnosis not present

## 2022-08-21 LAB — POCT GLYCOSYLATED HEMOGLOBIN (HGB A1C): Hemoglobin A1C: 7.7 % — AB (ref 4.0–5.6)

## 2022-08-21 MED ORDER — SENNA-DOCUSATE SODIUM 8.6-50 MG PO TABS: 1.0000 | ORAL_TABLET | Freq: Every day | ORAL | 1 refills | Status: DC

## 2022-08-21 MED ORDER — PANTOPRAZOLE SODIUM 40 MG PO TBEC: 40.0000 mg | DELAYED_RELEASE_TABLET | Freq: Every day | ORAL | 1 refills | Status: DC

## 2022-08-21 NOTE — Progress Notes (Signed)
Established Patient Office Visit  Subjective   Patient ID: Jill Munoz, female    DOB: 1951/07/18  Age: 71 y.o. MRN: [id number]  Chief Complaint  Patient presents with  . Follow-up    Patient is here for follow up  Patient is reporting inability to sleep. States that she is having more "foam" in her mouth, no coughing, she is also reporting a burning in her back and also her chest, no chest pain, no pain with inspiration. States that when the foam "comes up" she feels better. States that it is happening in the mornings and the evenings.  States that she has a lot of trouble sleeping -- she cannot lay down comfortably at night.   Patient is reporting continued constipation, states it is still very hard to have a bowel movement, thinks that it is making her burning in her chest worse. Also complaining of gas pain in her abdomen, states she has difficulty passing it.   Current Outpatient Medications  Medication Instructions  . Berberine Chloride 500 MG CAPS 1 capsule, Oral, 3 times daily  . brimonidine (ALPHAGAN) 0.2 % ophthalmic solution 1 drop, Both Eyes, 3 times daily  . dapagliflozin propanediol (FARXIGA) 5 mg, Oral, Daily  . dorzolamide-timolol (COSOPT) 22.3-6.8 MG/ML ophthalmic solution 1 drop, Both Eyes, 2 times daily  . fluconazole (DIFLUCAN) 150 MG tablet Take 1 tablet every 3 days for 2 doses  . lidocaine (XYLOCAINE) 2 % solution 15 mLs, Mouth/Throat, Every 4 hours PRN  . lidocaine (XYLOCAINE) 2 % solution 15 mLs, Mouth/Throat, Every 4 hours PRN  . melatonin 3 mg, Oral, Daily at bedtime  . pantoprazole (PROTONIX) 40 mg, Oral, Daily  . sennosides-docusate sodium (SENOKOT-S) 8.6-50 MG tablet 1 tablet, Oral, Daily PRN  . sennosides-docusate sodium (SENOKOT-S) 8.6-50 MG tablet 1 tablet, Oral, Daily  . vitamin C 1,000 mg, Oral, Daily    Patient Active Problem List   Diagnosis Date Noted  . Amputee, above knee, left (HCC) 05/11/2022  . Anemia of chronic disease 05/11/2022  .  Constipation 05/11/2022  . Kyphosis of thoracic region 05/11/2022  . Blurry vision, bilateral 05/11/2022  . Hypertension associated with diabetes (HCC) 05/11/2022  . Closed compression fracture of thoracic vertebra (HCC) 05/11/2022  . Weakness   . Hypokalemia 04/10/2022  . Bacteremia due to Streptococcus Pyogenes 04/03/2022  . Type 2 diabetes mellitus with hyperglycemia (HCC) 04/03/2022  . Beta-hemolytic group A streptococcal sepsis (HCC)   . 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 Hyponatremia 04/02/2022      Review of Systems  All other systems reviewed and are negative.     Objective:     BP 112/70 (BP Location: Left Arm, Patient Position: Sitting, Cuff Size: Normal)   Pulse 80   Temp (!) 96.5 F (35.8 C) (Axillary)   SpO2 98%  {Vitals History (Optional):23777}  Physical Exam Vitals reviewed.  Constitutional:      Appearance: Normal appearance. She is well-groomed and normal weight.  Eyes:     Conjunctiva/sclera: Conjunctivae normal.  Neck:     Thyroid: No thyromegaly.  Cardiovascular:     Rate and Rhythm: Normal rate and regular rhythm.     Pulses: Normal pulses.     Heart sounds: S1 normal and S2 normal.  Pulmonary:     Effort: Pulmonary effort is normal.     Breath sounds: Normal breath sounds and air entry.  Abdominal:     General: Bowel sounds are normal.  Musculoskeletal:  Right lower leg: No edema.     Left lower leg: No edema.  Neurological:     Mental Status: She is alert and oriented to person, place, and time. Mental status is at baseline.     Gait: Gait is intact.  Psychiatric:        Mood and Affect: Mood and affect normal.        Speech: Speech normal.        Behavior: Behavior normal.        Judgment: Judgment normal.     Results for orders placed or performed in visit on 08/21/22  POC HgB A1c  Result Value Ref Range   Hemoglobin A1C 7.7 (A) 4.0 - 5.6 %   HbA1c POC (<> result, manual entry)      HbA1c, POC (prediabetic range)     HbA1c, POC (controlled diabetic range)      {Labs (Optional):23779}  The ASCVD Risk score (Arnett DK, et al., 2019) failed to calculate for the following reasons:   The valid HDL cholesterol range is 20 to 100 mg/dL   The valid total cholesterol range is 130 to 320 mg/dL    Assessment & Plan:   Problem List Items Addressed This Visit       Endocrine   Type 2 diabetes mellitus with hyperglycemia (HCC) - Primary   Relevant Medications   dapagliflozin propanediol (FARXIGA) 5 MG TABS tablet   Other Relevant Orders   POC HgB A1c (Completed)   Microalbumin/Creatinine Ratio, Urine   Other Visit Diagnoses     Gastroesophageal reflux disease without esophagitis       Relevant Medications   pantoprazole (PROTONIX) 40 MG tablet   sennosides-docusate sodium (SENOKOT-S) 8.6-50 MG tablet   Chronic constipation       Relevant Medications   sennosides-docusate sodium (SENOKOT-S) 8.6-50 MG tablet   Hair loss       Relevant Orders   TSH   Vitamin B12       Return in about 3 months (around 11/21/2022) for follow up .    Farrel Conners, MD

## 2022-08-21 NOTE — Patient Instructions (Signed)
Take your berberine 30 minutes before your meals

## 2022-08-22 DIAGNOSIS — K219 Gastro-esophageal reflux disease without esophagitis: Secondary | ICD-10-CM | POA: Insufficient documentation

## 2022-08-22 NOTE — Assessment & Plan Note (Signed)
This could explain the burning symptoms in her chest and back. I will start her on pantoprazole 40 mg daily and see her back in 3 months to re-evaluate her symptoms.

## 2022-08-22 NOTE — Assessment & Plan Note (Signed)
Will add senokot S 1-2 tablets daily. I also recommended using prune juice as needed also.

## 2022-08-22 NOTE — Assessment & Plan Note (Signed)
A1C is improved on the 5 mg farxiga daily, I offered to increase this medication to help lower her A1C further, however pt wants to wait to see if her A1C improves over the next 3 months. Will recheck A1C in 3 months

## 2022-08-24 LAB — MICROALBUMIN / CREATININE URINE RATIO
Creatinine,U: 21 mg/dL
Microalb Creat Ratio: 7.2 mg/g (ref 0.0–30.0)
Microalb, Ur: 1.5 mg/dL (ref 0.0–1.9)

## 2022-08-31 ENCOUNTER — Telehealth: Payer: Self-pay | Admitting: Family Medicine

## 2022-08-31 NOTE — Telephone Encounter (Signed)
Pt is having px in her back and when she tries to move it

## 2022-09-03 NOTE — Telephone Encounter (Signed)
Caller states he is calling for the pt. The pt is experiencing lower back pain and they want to know what to do for her.  08/31/2022 12:09:04 PM FINAL ATTEMPT MADE - message left Donadeo, RN, Belenda Cruise  09/03/22 1020 - LVM instructions to call back to check status of pt & to determine if appt with PCP is needed. Please transfer to CMA if pt returns call.

## 2022-09-03 NOTE — Telephone Encounter (Signed)
Norma transferred the call to me as below.  Spoke with the patient's husband and he stated the patient is slightly better today and agreed to call back for an appt if needed.  Message sent to PCP.

## 2022-09-17 ENCOUNTER — Telehealth: Payer: Self-pay | Admitting: Orthopedic Surgery

## 2022-09-17 NOTE — Telephone Encounter (Signed)
I called pt and lm on vm to advise that because she has insurance there was not a program that could help with cost however, she is approved for a limb and her responsibility would be 20% and [facility] is working on reducing that to half of the amount out of pocket and working on getting her a prosthetic. To call with any questions and also advised to call [facility] as they would be able to give her the most accurate and up to date information.

## 2022-09-17 NOTE — Telephone Encounter (Signed)
Patient requesting financial assistance for [facility] Prosthetic. Please advise..[phone]

## 2022-09-17 NOTE — Telephone Encounter (Signed)
Message sent to [facility] with [facility] asking if there are any programs available for this pt for financial assistance? We do not offer that here. Will hold pending advisement

## 2022-09-18 ENCOUNTER — Telehealth: Payer: Self-pay | Admitting: Family Medicine

## 2022-09-18 MED ORDER — ACCU-CHEK GUIDE VI STRP: ORAL_STRIP | 3 refills | Status: AC

## 2022-09-18 NOTE — Telephone Encounter (Signed)
Rx done. 

## 2022-09-18 NOTE — Telephone Encounter (Signed)
Pt husband is calling and pt needs new rx for accu check guide testing strip please send to  [id number] - [place], [place] - [address] AT [facility] [address] [facility] [address] Phone: [phone]  Fax: [phone]

## 2022-09-21 ENCOUNTER — Ambulatory Visit: Payer: Medicare Other

## 2022-09-27 ENCOUNTER — Telehealth: Payer: Self-pay | Admitting: Orthopedic Surgery

## 2022-09-27 NOTE — Telephone Encounter (Signed)
Pt's husband Gerri Lins called about getting a script for a wheelchair. Please call pt at [phone]. He states pt need wheelchair

## 2022-09-27 NOTE — Telephone Encounter (Signed)
I called and lm on vm to advise that I need an updated height and weight for the pt to include in the order for wheelchair. Advised that once this has been submitted [facility] will call and discuss any potential out of pocket expenses the pt may have an also discuss delivery. They will not deliver the product without speaking to someone so advised to answer any unknown numbers as it may be them. To call with height and weight and I will submit order today.

## 2022-10-02 NOTE — Telephone Encounter (Signed)
Pt has not returned calls. She has an appt on 10/09/2022 and we can obtain a height and weight at that appt and place the order for her wheelchair if she does not call back before then.

## 2022-10-09 ENCOUNTER — Ambulatory Visit (INDEPENDENT_AMBULATORY_CARE_PROVIDER_SITE_OTHER): Payer: Medicare Other

## 2022-10-09 VITALS — Ht 61.0 in | Wt 157.0 lb

## 2022-10-09 DIAGNOSIS — Z0000 Encounter for general adult medical examination without abnormal findings: Secondary | ICD-10-CM

## 2022-10-09 NOTE — Patient Instructions (Addendum)
Ms. Jill Munoz , Thank you for taking time to come for your Medicare Wellness Visit. I appreciate your ongoing commitment to your health goals. Please review the following plan we discussed and let me know if I can assist you in the future.   These are the goals we discussed:  Goals       Patient stated (pt-stated)      I want to walk on my own w/o use of medical devices.        This is a list of the screening recommended for you and due dates:  Health Maintenance  Topic Date Due   Complete foot exam   Never done   Eye exam for diabetics  Never done   DTaP/Tdap/Td vaccine (1 - Tdap) Never done   DEXA scan (bone density measurement)  Never done   COVID-19 Vaccine (1) 10/25/2022*   Zoster (Shingles) Vaccine (1 of 2) 11/21/2022*   Flu Shot  01/13/2023*   Pneumonia Vaccine (1 - PCV) 05/12/2023*   Mammogram  05/12/2023*   Colon Cancer Screening  05/12/2023*   Hepatitis C Screening: USPSTF Recommendation to screen - Ages 18-79 yo.  05/12/2023*   Hemoglobin A1C  02/19/2023   Yearly kidney function blood test for diabetes  04/27/2023   Yearly kidney health urinalysis for diabetes  08/25/2023   Medicare Annual Wellness Visit  10/10/2023   HPV Vaccine  Aged Out  *Topic was postponed. The date shown is not the original due date.    Advanced directives: Please bring a copy of your health care power of attorney and living will to the office to be added to your chart at your convenience.   Conditions/risks identified: None  Next appointment: Follow up in one year for your annual wellness visit     Preventive Care 65 Years and Older, Female Preventive care refers to lifestyle choices and visits with your health care provider that can promote health and wellness. What does preventive care include? A yearly physical exam. This is also called an annual well check. Dental exams once or twice a year. Routine eye exams. Ask your health care provider how often you should have your eyes  checked. Personal lifestyle choices, including: Daily care of your teeth and gums. Regular physical activity. Eating a healthy diet. Avoiding tobacco and drug use. Limiting alcohol use. Practicing safe sex. Taking low-dose aspirin every day. Taking vitamin and mineral supplements as recommended by your health care provider. What happens during an annual well check? The services and screenings done by your health care provider during your annual well check will depend on your age, overall health, lifestyle risk factors, and family history of disease. Counseling  Your health care provider may ask you questions about your: Alcohol use. Tobacco use. Drug use. Emotional well-being. Home and relationship well-being. Sexual activity. Eating habits. History of falls. Memory and ability to understand (cognition). Work and work Astronomer. Reproductive health. Screening  You may have the following tests or measurements: Height, weight, and BMI. Blood pressure. Lipid and cholesterol levels. These may be checked every 5 years, or more frequently if you are over 6 years old. Skin check. Lung cancer screening. You may have this screening every year starting at age 87 if you have a 30-pack-year history of smoking and currently smoke or have quit within the past 15 years. Fecal occult blood test (FOBT) of the stool. You may have this test every year starting at age 39. Flexible sigmoidoscopy or colonoscopy. You may have a sigmoidoscopy  every 5 years or a colonoscopy every 10 years starting at age 44. Hepatitis C blood test. Hepatitis B blood test. Sexually transmitted disease (STD) testing. Diabetes screening. This is done by checking your blood sugar (glucose) after you have not eaten for a while (fasting). You may have this done every 1-3 years. Bone density scan. This is done to screen for osteoporosis. You may have this done starting at age 39. Mammogram. This may be done every 1-2  years. Talk to your health care provider about how often you should have regular mammograms. Talk with your health care provider about your test results, treatment options, and if necessary, the need for more tests. Vaccines  Your health care provider may recommend certain vaccines, such as: Influenza vaccine. This is recommended every year. Tetanus, diphtheria, and acellular pertussis (Tdap, Td) vaccine. You may need a Td booster every 10 years. Zoster vaccine. You may need this after age 59. Pneumococcal 13-valent conjugate (PCV13) vaccine. One dose is recommended after age 33. Pneumococcal polysaccharide (PPSV23) vaccine. One dose is recommended after age 27. Talk to your health care provider about which screenings and vaccines you need and how often you need them. This information is not intended to replace advice given to you by your health care provider. Make sure you discuss any questions you have with your health care provider. Document Released: 10/28/2015 Document Revised: 06/20/2016 Document Reviewed: 08/02/2015 Elsevier Interactive Patient Education  2017 [place] Prevention in the Home Falls can cause injuries. They can happen to people of all ages. There are many things you can do to make your home safe and to help prevent falls. What can I do on the outside of my home? Regularly fix the edges of walkways and driveways and fix any cracks. Remove anything that might make you trip as you walk through a door, such as a raised step or threshold. Trim any bushes or trees on the path to your home. Use bright outdoor lighting. Clear any walking paths of anything that might make someone trip, such as rocks or tools. Regularly check to see if handrails are loose or broken. Make sure that both sides of any steps have handrails. Any raised decks and porches should have guardrails on the edges. Have any leaves, snow, or ice cleared regularly. Use sand or salt on walking paths  during winter. Clean up any spills in your garage right away. This includes oil or grease spills. What can I do in the bathroom? Use night lights. Install grab bars by the toilet and in the tub and shower. Do not use towel bars as grab bars. Use non-skid mats or decals in the tub or shower. If you need to sit down in the shower, use a plastic, non-slip stool. Keep the floor dry. Clean up any water that spills on the floor as soon as it happens. Remove soap buildup in the tub or shower regularly. Attach bath mats securely with double-sided non-slip rug tape. Do not have throw rugs and other things on the floor that can make you trip. What can I do in the bedroom? Use night lights. Make sure that you have a light by your bed that is easy to reach. Do not use any sheets or blankets that are too big for your bed. They should not hang down onto the floor. Have a firm chair that has side arms. You can use this for support while you get dressed. Do not have throw rugs and other things  on the floor that can make you trip. What can I do in the kitchen? Clean up any spills right away. Avoid walking on wet floors. Keep items that you use a lot in easy-to-reach places. If you need to reach something above you, use a strong step stool that has a grab bar. Keep electrical cords out of the way. Do not use floor polish or wax that makes floors slippery. If you must use wax, use non-skid floor wax. Do not have throw rugs and other things on the floor that can make you trip. What can I do with my stairs? Do not leave any items on the stairs. Make sure that there are handrails on both sides of the stairs and use them. Fix handrails that are broken or loose. Make sure that handrails are as long as the stairways. Check any carpeting to make sure that it is firmly attached to the stairs. Fix any carpet that is loose or worn. Avoid having throw rugs at the top or bottom of the stairs. If you do have throw  rugs, attach them to the floor with carpet tape. Make sure that you have a light switch at the top of the stairs and the bottom of the stairs. If you do not have them, ask someone to add them for you. What else can I do to help prevent falls? Wear shoes that: Do not have high heels. Have rubber bottoms. Are comfortable and fit you well. Are closed at the toe. Do not wear sandals. If you use a stepladder: Make sure that it is fully opened. Do not climb a closed stepladder. Make sure that both sides of the stepladder are locked into place. Ask someone to hold it for you, if possible. Clearly mark and make sure that you can see: Any grab bars or handrails. First and last steps. Where the edge of each step is. Use tools that help you move around (mobility aids) if they are needed. These include: Canes. Walkers. Scooters. Crutches. Turn on the lights when you go into a dark area. Replace any light bulbs as soon as they burn out. Set up your furniture so you have a clear path. Avoid moving your furniture around. If any of your floors are uneven, fix them. If there are any pets around you, be aware of where they are. Review your medicines with your doctor. Some medicines can make you feel dizzy. This can increase your chance of falling. Ask your doctor what other things that you can do to help prevent falls. This information is not intended to replace advice given to you by your health care provider. Make sure you discuss any questions you have with your health care provider. Document Released: 07/28/2009 Document Revised: 03/08/2016 Document Reviewed: 11/05/2014 Elsevier Interactive Patient Education  2017 [employer].

## 2022-10-09 NOTE — Progress Notes (Signed)
Subjective:   Jill Munoz is a 71 y.o. female who presents for Medicare Annual (Subsequent) preventive examination.  Review of Systems    Virtual Visit via Telephone Note  I connected with  Camren Henthorn on 10/09/22 at  9:45 AM EST by telephone and verified that I am speaking with the correct person using two identifiers.  Location: Patient: Home Provider: Office Persons participating in the virtual visit: patient/Nurse Health Advisor   I discussed the limitations, risks, security and privacy concerns of performing an evaluation and management service by telephone and the availability of in person appointments. The patient expressed understanding and agreed to proceed.  Interactive audio and video telecommunications were attempted between this nurse and patient, however failed, due to patient having technical difficulties OR patient did not have access to video capability.  We continued and completed visit with audio only.  Some vital signs may be absent or patient reported.   Criselda Peaches, LPN  Cardiac Risk Factors include: advanced age (>48mn, >>36women);hypertension;diabetes mellitus     Objective:    Today's Vitals   10/09/22 0947  Weight: 157 lb (71.2 kg)  Height: _0  (1.549 m)   Body mass index is 29.66 kg/m.     10/09/2022   10:08 AM 04/06/2022    8:21 AM 04/02/2022   10:16 AM  Advanced Directives  Does Patient Have a Medical Advance Directive? Yes No No  Type of [place]amed[place]of [place] will    Does patient want to make changes to medical advance directive? No - Patient declined    Copy of [place]? No - copy requested    Would patient like information on creating a medical advance directive? No - Patient declined No - Patient declined No - Patient declined    Current Medications (verified) Outpatient Encounter Medications as of 10/09/2022  Medication Sig   Ascorbic Acid (VITAMIN C) 1000  MG tablet Take 1,000 mg by mouth daily.   Berberine Chloride 500 MG CAPS Take 1 capsule by mouth in the morning, at noon, and at bedtime.   brimonidine (ALPHAGAN) 0.2 % ophthalmic solution Place 1 drop into both eyes 3 (three) times daily.   dapagliflozin propanediol (FARXIGA) 5 MG TABS tablet Take 5 mg by mouth daily.   dorzolamide-timolol (COSOPT) 22.3-6.8 MG/ML ophthalmic solution Place 1 drop into both eyes 2 (two) times daily.   fluconazole (DIFLUCAN) 150 MG tablet Take 1 tablet every 3 days for 2 doses   glucose blood (ACCU-CHEK GUIDE) test strip Use as instructed once a day   lidocaine (XYLOCAINE) 2 % solution Use as directed 15 mLs in the mouth or throat every 4 (four) hours as needed for mouth pain.   lidocaine (XYLOCAINE) 2 % solution Use as directed 15 mLs in the mouth or throat every 4 (four) hours as needed for mouth pain.   melatonin 3 MG TABS tablet Take 3 mg by mouth at bedtime.   pantoprazole (PROTONIX) 40 MG tablet Take 1 tablet (40 mg total) by mouth daily.   sennosides-docusate sodium (SENOKOT-S) 8.6-50 MG tablet Take 1 tablet by mouth daily as needed for constipation.   sennosides-docusate sodium (SENOKOT-S) 8.6-50 MG tablet Take 1 tablet by mouth daily.   No facility-administered encounter medications on file as of 10/09/2022.    Allergies (verified) Patient has no known allergies.   History: Past Medical History:  Diagnosis Date   Pre-diabetes    Past Surgical History:  Procedure Laterality Date  AMPUTATION  04/06/2022   Procedure: AMPUTATION ABOVE KNEE REVISION;  Surgeon: Newt Minion, MD;  Location: [place];  Service: Orthopedics;;   I & D EXTREMITY Left 04/02/2022   Procedure: IRRIGATION AND DEBRIDEMENT LEFT LOWER LEG APPLICATION OF WOUND VAC ;  Surgeon: Georgeanna Harrison, MD;  Location: [facility];  Service: Orthopedics;  Laterality: Left;   I & D EXTREMITY Left 04/04/2022   Procedure: LEFT ABOVE KNEE AMPUTATION;  Surgeon: Newt Minion, MD;  Location: [facility];   Service: Orthopedics;  Laterality: Left;   I & D EXTREMITY Left 04/06/2022   Procedure: LEFT LEG DEBRIDEMENT;  Surgeon: Newt Minion, MD;  Location: [facility];  Service: Orthopedics;  Laterality: Left;   STUMP REVISION Left 04/11/2022   Procedure: REVISION LEFT ABOVE KNEE AMPUTATION;  Surgeon: Newt Minion, MD;  Location: [facility];  Service: Orthopedics;  Laterality: Left;   History reviewed. No pertinent family history. Social History   Socioeconomic History   Marital status: Married    Spouse name: Not on file   Number of children: Not on file   Years of education: Not on file   Highest education level: Not on file  Occupational History   Not on file  Tobacco Use   Smoking status: Never   Smokeless tobacco: Never  Vaping Use   Vaping Use: Never used  Substance and Sexual Activity   Alcohol use: Never   Drug use: Never   Sexual activity: Not on file  Other Topics Concern   Not on file  Social History Narrative   Not on file   Social Determinants of Health   Financial Resource Strain: Low Risk  (10/09/2022)   Overall Financial Resource Strain (CARDIA)    Difficulty of Paying Living Expenses: Not hard at all  Food Insecurity: No Food Insecurity (10/09/2022)   Hunger Vital Sign    Worried About Running Out of Food in the Last Year: Never true    [place] in the Last Year: Never true  Transportation Needs: No Transportation Needs (10/09/2022)   PRAPARE - Hydrologist (Medical): No    Lack of Transportation (Non-Medical): No  Physical Activity: Inactive (10/09/2022)   Exercise Vital Sign    Days of Exercise per Week: 0 days    Minutes of Exercise per Session: 0 min  Stress: No Stress Concern Present (10/09/2022)   [place]    Feeling of Stress : Not at all  Social Connections: [place] (10/09/2022)   Social Connection and Isolation Panel [NHANES]    Frequency  of Communication with Friends and Family: More than three times a week    Frequency of Social Gatherings with Friends and Family: More than three times a week    Attends Religious Services: More than 4 times per year    Active Member of [employer] or Organizations: Yes    Attends Music therapist: More than 4 times per year    Marital Status: Married    Tobacco Counseling Counseling given: Not Answered   Clinical Intake:  Pre-visit preparation completed: No  Pain : No/denies pain     BMI - recorded: 29.66 Nutritional Status: BMI 25 -29 Overweight Nutritional Risks: None Diabetes: Yes CBG done?: Yes CBG resulted in Enter/ Edit results?: Yes (CBG 155 Taken by patient) Did pt. bring in CBG monitor from home?: No Nutrition Risk Assessment:  Has the patient had any N/V/D within  the last 2 months?  No  Does the patient have any non-healing wounds?  No  Has the patient had any unintentional weight loss or weight gain?  No   Diabetes:  Is the patient diabetic?  Yes  If diabetic, was a CBG obtained today?  Yes CBG 155 Taken by patient Did the patient bring in their glucometer from home?  No  How often do you monitor your CBG's? Daily.   Financial Strains and Diabetes Management:  Are you having any financial strains with the device, your supplies or your medication? No .  Does the patient want to be seen by Chronic Care Management for management of their diabetes?  No  Would the patient like to be referred to a Nutritionist or for Diabetic Management?  No   Diabetic Exams:  Diabetic Eye Exam: Completed No. Overdue for diabetic eye exam. Pt has been advised about the importance in completing this exam. A referral has been placed today. Message sent to referral coordinator for scheduling purposes. Advised pt to expect a call from office referred to regarding appt.  Diabetic Foot Exam: Completed No. Pt has been advised about the importance in completing this exam. Pt is  scheduled for diabetic foot exam on Followed by PCP.   How often do you need to have someone help you when you read instructions, pamphlets, or other written materials from your doctor or pharmacy?: 1 - Never  Diabetic?  Yes  Interpreter Needed?: No  Information entered by :: Rolene Arbour LPN   Activities of Daily Living    10/09/2022   10:02 AM 04/12/2022    9:12 PM  In your present state of health, do you have any difficulty performing the following activities:  Hearing? 0 0  Vision? 0 0  Difficulty concentrating or making decisions? 0 0  Walking or climbing stairs? 1 1  Comment Left leg amputation, Uses Cane, walker and wheelchair   Dressing or bathing? 1 1  Comment Husband assist   Doing errands, shopping? 1 1  Comment Husband assist   Preparing Food and eating ? Y   Comment Husband assist   Using the Toilet? N   Comment Husband assist   In the past six months, have you accidently leaked urine? N   Do you have problems with loss of bowel control? N   Managing your Medications? Y   Comment Husband assist   Managing your Finances? Y   Comment Husband assist   Housekeeping or managing your Housekeeping? Y   Comment Husband assist     Patient Care Team: Farrel Conners, MD as PCP - General (Family Medicine)  Indicate any recent Medical Services you may have received from other than [facility] providers in the past year (date may be approximate).     Assessment:   This is a routine wellness examination for Lydiah.  Hearing/Vision screen Hearing Screening - Comments:: Denies hearing difficulties   Vision Screening - Comments:: - up to date with routine eye exams with  [facility]  Dietary issues and exercise activities discussed: Current Exercise Habits: The patient does not participate in regular exercise at present, Exercise limited by: orthopedic condition(s)   Goals Addressed               This Visit's Progress     Patient stated (pt-stated)        I  want to walk on my own w/o use of medical devices.       Depression  Screen    10/09/2022   10:00 AM  PHQ 2/9 Scores  PHQ - 2 Score 0    Fall Risk    10/09/2022   10:06 AM 04/26/2022    2:05 PM  [place] in the past year? 0 0  Number falls in past yr: 0   Injury with Fall? 0   Risk for fall due to : No Fall Risks Impaired balance/gait;Impaired mobility  Follow up Falls prevention discussed Falls evaluation completed    FALL RISK PREVENTION PERTAINING TO THE HOME:  Any stairs in or around the home? Yes  If so, are there any without handrails? No Home free of loose throw rugs in walkways, pet beds, electrical cords, etc? Yes  Adequate lighting in your home to reduce risk of falls? Yes   ASSISTIVE DEVICES UTILIZED TO PREVENT FALLS:  Life alert? No  Use of a cane, walker or w/c? Yes  Grab bars in the bathroom? No  Shower chair or bench in shower? No  Elevated toilet seat or a handicapped toilet? No   TIMED UP AND GO:  Was the test performed? No . Audio Visit  Cognitive Function:        10/09/2022   10:08 AM  6CIT Screen  What Year? 0 points  What month? 0 points  What time? 0 points  Count back from 20 0 points  Months in reverse 0 points  Repeat phrase 0 points  Total Score 0 points    Immunizations  There is no immunization history on file for this patient.  TDAP status: Due, Education has been provided regarding the importance of this vaccine. Advised may receive this vaccine at local pharmacy or Health Dept. Aware to provide a copy of the vaccination record if obtained from local pharmacy or Health Dept. Verbalized acceptance and understanding.  Flu Vaccine status: Declined, Education has been provided regarding the importance of this vaccine but patient still declined. Advised may receive this vaccine at local pharmacy or Health Dept. Aware to provide a copy of the vaccination record if obtained from local pharmacy or Health Dept. Verbalized  acceptance and understanding.  Pneumococcal vaccine status: Declined,  Education has been provided regarding the importance of this vaccine but patient still declined. Advised may receive this vaccine at local pharmacy or Health Dept. Aware to provide a copy of the vaccination record if obtained from local pharmacy or Health Dept. Verbalized acceptance and understanding.   Covid-19 vaccine status: Declined, Education has been provided regarding the importance of this vaccine but patient still declined. Advised may receive this vaccine at local pharmacy or Health Dept.or vaccine clinic. Aware to provide a copy of the vaccination record if obtained from local pharmacy or Health Dept. Verbalized acceptance and understanding.  Qualifies for Shingles Vaccine? Yes   Zostavax completed No   Shingrix Completed?: No.    Education has been provided regarding the importance of this vaccine. Patient has been advised to call insurance company to determine out of pocket expense if they have not yet received this vaccine. Advised may also receive vaccine at local pharmacy or Health Dept. Verbalized acceptance and understanding.  Screening Tests Health Maintenance  Topic Date Due   FOOT EXAM  Never done   OPHTHALMOLOGY EXAM  Never done   DTaP/Tdap/Td (1 - Tdap) Never done   DEXA SCAN  Never done   COVID-19 Vaccine (1) 10/25/2022 (Originally 09/15/1951)   Zoster Vaccines- Shingrix (1 of 2) 11/21/2022 (Originally 03/17/2003)  INFLUENZA VACCINE  01/13/2023 (Originally 05/15/2022)   Pneumonia Vaccine 52+ Years old (1 - PCV) 05/12/2023 (Originally 03/16/2018)   MAMMOGRAM  05/12/2023 (Originally 03/17/2003)   COLONOSCOPY (Pts 45-73yr Insurance coverage will need to be confirmed)  05/12/2023 (Originally 03/16/1998)   Hepatitis C Screening  05/12/2023 (Originally 03/17/1969)   HEMOGLOBIN A1C  02/19/2023   Diabetic kidney evaluation - eGFR measurement  04/27/2023   Diabetic kidney evaluation - Urine ACR  08/25/2023    Medicare Annual Wellness (AWV)  10/10/2023   HPV VACCINES  Aged Out    Health Maintenance  Health Maintenance Due  Topic Date Due   FOOT EXAM  Never done   OPHTHALMOLOGY EXAM  Never done   DTaP/Tdap/Td (1 - Tdap) Never done   DEXA SCAN  Never done    Colorectal cancer screening: Referral to GI placed Deferred. Pt aware the office will call re: appt.  Mammogram status: Ordered Deferred. Pt provided with contact info and advised to call to schedule appt.     Lung Cancer Screening: (Low Dose CT Chest recommended if Age 69-80years, 30 pack-year currently smoking OR have quit w/in 15years.) does not qualify.     Additional Screening:  Hepatitis C Screening: does qualify;  Deferred  Vision Screening: Recommended annual ophthalmology exams for early detection of glaucoma and other disorders of the eye. Is the patient up to date with their annual eye exam?  Yes  Who is the provider or what is the name of the office in which the patient attends annual eye exams? [place] is not established with a provider, would they like to be referred to a provider to establish care? No .   Dental Screening: Recommended annual dental exams for proper oral hygiene  Community Resource Referral / Chronic Care Management:  CRR required this visit?  No   CCM required this visit?  No      Plan:     I have personally reviewed and noted the following in the patient's chart:   Medical and social history Use of alcohol, tobacco or illicit drugs  Current medications and supplements including opioid prescriptions. Patient is not currently taking opioid prescriptions. Functional ability and status Nutritional status Physical activity Advanced directives List of other physicians Hospitalizations, surgeries, and ER visits in previous 12 months Vitals Screenings to include cognitive, depression, and falls Referrals and appointments  In addition, I have reviewed and discussed with  patient certain preventive protocols, quality metrics, and best practice recommendations. A written personalized care plan for preventive services as well as general preventive health recommendations were provided to patient.     BCriselda Peaches LPN   [id number]  Nurse Notes: None

## 2022-10-18 ENCOUNTER — Telehealth: Payer: Self-pay | Admitting: Orthopedic Surgery

## 2022-10-18 NOTE — Telephone Encounter (Signed)
Patient height and weight 5 2  weight 150. Patient stated that this was requested for wheel chair. [phone]

## 2022-10-19 NOTE — Telephone Encounter (Signed)
Order sent through parach[employer] and will hold this message to monitor for approval.

## 2022-10-22 NOTE — Telephone Encounter (Signed)
I called and sw pt's husband. I checked the portal this morning and message states that the pt is already renting a wheelchair from [place]. He questioned if her insurance would allow for her to purchase and I advised that they can call and discuss this with [employer]. Will call with any other questions.

## 2022-10-22 NOTE — Telephone Encounter (Signed)
Per [employer], pt is already actively renting wheelchair from their company. They did cancel the order.

## 2022-11-09 ENCOUNTER — Telehealth: Payer: Self-pay | Admitting: Orthopedic Surgery

## 2022-11-09 NOTE — Telephone Encounter (Signed)
Patient would like a RX for a WC. Would like someone to call her. [phone]

## 2022-11-12 NOTE — Telephone Encounter (Signed)
I tried to call pt and mailbox is full. We had spoken about a wheelchair earlier this month and I had advised the pt that they are currently renting a wheelchair through [employer] and that they can call and see what they can do about making this a purchase vs renting. Had advised to call the company. I will hold message and try to reach pt again.

## 2022-11-12 NOTE — Telephone Encounter (Signed)
I called and sw pt's husband to advise again that he will have to contact [employer] to change for a rental to a purchase of a wheelchair. I am not able to write an order as she already has one as I was advised earlier this month. The pt's husband asked if they can get one from another vendor. I advised that is this is being billed trough her insurance then I do not believe so. Being a rental it may be different but encouraged him to call [employer] so that they can help him with this and if there was anything that I needed to do to to let me know and I would be happy to help.

## 2022-11-13 ENCOUNTER — Ambulatory Visit: Payer: Medicare Other | Admitting: Family

## 2022-11-20 ENCOUNTER — Ambulatory Visit: Payer: Medicare Other | Admitting: Family

## 2022-12-02 ENCOUNTER — Other Ambulatory Visit: Payer: Self-pay | Admitting: Family Medicine

## 2022-12-07 DIAGNOSIS — Z89612 Acquired absence of left leg above knee: Secondary | ICD-10-CM | POA: Diagnosis not present

## 2022-12-10 ENCOUNTER — Other Ambulatory Visit: Payer: Self-pay

## 2022-12-10 DIAGNOSIS — H4053X4 Glaucoma secondary to other eye disorders, bilateral, indeterminate stage: Secondary | ICD-10-CM | POA: Diagnosis not present

## 2022-12-10 DIAGNOSIS — H2513 Age-related nuclear cataract, bilateral: Secondary | ICD-10-CM | POA: Diagnosis not present

## 2022-12-10 DIAGNOSIS — H16143 Punctate keratitis, bilateral: Secondary | ICD-10-CM | POA: Diagnosis not present

## 2022-12-10 DIAGNOSIS — S78112A Complete traumatic amputation at level between left hip and knee, initial encounter: Secondary | ICD-10-CM

## 2022-12-10 DIAGNOSIS — E113513 Type 2 diabetes mellitus with proliferative diabetic retinopathy with macular edema, bilateral: Secondary | ICD-10-CM | POA: Diagnosis not present

## 2022-12-10 DIAGNOSIS — H02402 Unspecified ptosis of left eyelid: Secondary | ICD-10-CM | POA: Diagnosis not present

## 2022-12-13 ENCOUNTER — Telehealth: Payer: Self-pay | Admitting: Orthopedic Surgery

## 2022-12-13 NOTE — Telephone Encounter (Signed)
Pt's Jill Munoz called requesting a call back. He states pt does not want to rent wheelchair form [employer] anymore she would like her insurance to pay for her own wheelchair. Pt's husband also asking for call to discuss someone coming out to help pt with artifical leg and instructions on how to put in on. Please call pt's husband at [phone].

## 2022-12-13 NOTE — Telephone Encounter (Signed)
I called and sw the pt's husband at length about the message below. Advised that he will have to call the number provided to speak with [employer] and see what they can do. I also advised that the pt has exhausted her in home PT visit and that an order for outpatient therapy has been placed. They have tried to call and schedule but the pt has not answered the phone. I advised that I would send a message to scheduling to see if they can set the pt up for prosthetic gait training.

## 2022-12-13 NOTE — Telephone Encounter (Signed)
The pt is currently renting a wheelchair with [employer]. I have explained multiple times he will have to contact [employer] [facility] to discuss not renting and actually purchasing a wheelchair they will not accept my order. (I have placed another order today with a note explaining the situation to see if I can get someone from [employer] to help th patient with this.  This is the message that I was sent in parachute:   He can call the local branch #Phone: [phone] and ask if they can replace the wheelchair. The insurance will not cover a new one, just because he wants a new one. He will need to reach out to the branch and see if they will swap out the current for a newer one.  I have asked Jill Munoz at [employer] to clarify if a new wheelchair would be out of pocket for the pt because insurance paid for the ental and this was also the response:    I cannot guarantee they will do that. That is an Pro[employer], television/film/video. However, he can pay the rest of the copays upfront, so they will not have to do it monthly. Then when time, it will automatically be converted to a purchase.   I will call the pt to discuss

## 2022-12-17 DIAGNOSIS — H4051X4 Glaucoma secondary to other eye disorders, right eye, indeterminate stage: Secondary | ICD-10-CM | POA: Diagnosis not present

## 2022-12-17 DIAGNOSIS — H4089 Other specified glaucoma: Secondary | ICD-10-CM | POA: Diagnosis not present

## 2023-01-03 DIAGNOSIS — D649 Anemia, unspecified: Secondary | ICD-10-CM | POA: Diagnosis not present

## 2023-01-03 DIAGNOSIS — E1140 Type 2 diabetes mellitus with diabetic neuropathy, unspecified: Secondary | ICD-10-CM | POA: Diagnosis not present

## 2023-01-04 ENCOUNTER — Telehealth: Payer: Self-pay | Admitting: Hematology and Oncology

## 2023-01-04 NOTE — Telephone Encounter (Signed)
scheduled per 3/22 referral , pt has been called and confirmed date and time. Pt is aware of location and to arrive early for check in

## 2023-01-10 ENCOUNTER — Inpatient Hospital Stay: Payer: Medicare Other

## 2023-01-10 ENCOUNTER — Other Ambulatory Visit: Payer: Self-pay

## 2023-01-10 ENCOUNTER — Inpatient Hospital Stay: Payer: Medicare Other | Attending: Hematology and Oncology | Admitting: Hematology and Oncology

## 2023-01-10 VITALS — BP 166/56 | HR 100 | Temp 97.7°F | Resp 18 | Ht 61.0 in

## 2023-01-10 DIAGNOSIS — E119 Type 2 diabetes mellitus without complications: Secondary | ICD-10-CM | POA: Diagnosis not present

## 2023-01-10 DIAGNOSIS — D649 Anemia, unspecified: Secondary | ICD-10-CM | POA: Insufficient documentation

## 2023-01-10 DIAGNOSIS — Z89612 Acquired absence of left leg above knee: Secondary | ICD-10-CM | POA: Diagnosis not present

## 2023-01-10 DIAGNOSIS — Z993 Dependence on wheelchair: Secondary | ICD-10-CM | POA: Diagnosis not present

## 2023-01-10 DIAGNOSIS — D75839 Thrombocytosis, unspecified: Secondary | ICD-10-CM | POA: Insufficient documentation

## 2023-01-10 LAB — IRON AND IRON BINDING CAPACITY (CC-WL,HP ONLY)
Iron: 21 ug/dL — ABNORMAL LOW (ref 28–170)
Saturation Ratios: 4 % — ABNORMAL LOW (ref 10.4–31.8)
TIBC: 473 ug/dL — ABNORMAL HIGH (ref 250–450)
UIBC: 452 ug/dL — ABNORMAL HIGH (ref 148–442)

## 2023-01-10 LAB — CMP (CANCER CENTER ONLY)
ALT: 11 U/L (ref 0–44)
AST: 11 U/L — ABNORMAL LOW (ref 15–41)
Albumin: 4 g/dL (ref 3.5–5.0)
Alkaline Phosphatase: 168 U/L — ABNORMAL HIGH (ref 38–126)
Anion gap: 8 (ref 5–15)
BUN: 27 mg/dL — ABNORMAL HIGH (ref 8–23)
CO2: 27 mmol/L (ref 22–32)
Calcium: 9.5 mg/dL (ref 8.9–10.3)
Chloride: 101 mmol/L (ref 98–111)
Creatinine: 1.04 mg/dL — ABNORMAL HIGH (ref 0.44–1.00)
GFR, Estimated: 58 mL/min — ABNORMAL LOW (ref >60)
Glucose, Bld: 303 mg/dL — ABNORMAL HIGH (ref 70–99)
Potassium: 4.3 mmol/L (ref 3.5–5.1)
Sodium: 136 mmol/L (ref 135–145)
Total Bilirubin: 0.2 mg/dL — ABNORMAL LOW (ref 0.3–1.2)
Total Protein: 8 g/dL (ref 6.5–8.1)

## 2023-01-10 LAB — CBC WITH DIFFERENTIAL/PLATELET
Abs Immature Granulocytes: 0.01 10*3/uL (ref 0.00–0.07)
Basophils Absolute: 0 10*3/uL (ref 0.0–0.1)
Basophils Relative: 0 %
Eosinophils Absolute: 0.1 10*3/uL (ref 0.0–0.5)
Eosinophils Relative: 2 %
HCT: 26.4 % — ABNORMAL LOW (ref 36.0–46.0)
Hemoglobin: 8.5 g/dL — ABNORMAL LOW (ref 12.0–15.0)
Immature Granulocytes: 0 %
Lymphocytes Relative: 22 %
Lymphs Abs: 1.2 10*3/uL (ref 0.7–4.0)
MCH: 24.4 pg — ABNORMAL LOW (ref 26.0–34.0)
MCHC: 32.2 g/dL (ref 30.0–36.0)
MCV: 75.6 fL — ABNORMAL LOW (ref 80.0–100.0)
Monocytes Absolute: 0.3 10*3/uL (ref 0.1–1.0)
Monocytes Relative: 6 %
Neutro Abs: 3.6 10*3/uL (ref 1.7–7.7)
Neutrophils Relative %: 70 %
Platelets: 393 10*3/uL (ref 150–400)
RBC: 3.49 MIL/uL — ABNORMAL LOW (ref 3.87–5.11)
RDW: 15.5 % (ref 11.5–15.5)
WBC: 5.2 10*3/uL (ref 4.0–10.5)
nRBC: 0 % (ref 0.0–0.2)

## 2023-01-10 LAB — RETICULOCYTES
Immature Retic Fract: 15.8 % (ref 2.3–15.9)
RBC.: 3.52 MIL/uL — ABNORMAL LOW (ref 3.87–5.11)
Retic Count, Absolute: 29.6 10*3/uL (ref 19.0–186.0)
Retic Ct Pct: 0.8 % (ref 0.4–3.1)

## 2023-01-10 LAB — LACTATE DEHYDROGENASE: LDH: 120 U/L (ref 98–192)

## 2023-01-10 LAB — FERRITIN: Ferritin: 12 ng/mL (ref 11–307)

## 2023-01-10 LAB — VITAMIN B12: Vitamin B-12: 746 pg/mL (ref 180–914)

## 2023-01-10 NOTE — Progress Notes (Signed)
[place] NOTE  Patient Care Team: Farrel Conners, MD as PCP - General (Family Medicine)  CHIEF COMPLAINTS/PURPOSE OF CONSULTATION:  Normocytic normochromic anemia.  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  She was recently started on oral iron supplementation last Friday.  She denies any complaints today.  Physical examination today unremarkable, no major findings.  AKA noted, she is wheelchair-bound because of this.  She does not have a prosthesis yet.  I reviewed her labs from her PCPs office which showed normocytic normochromic anemia, of note her labs were similar back in July 2023 when she had the left leg amputation.  I do not see an iron panel and ferritin done on her most recent labs.  I do agree that could be a mild form of iron deficiency given the thrombocytosis and anemia however I recommended additional blood work including iron panel, ferritin, [id number], folic acid, hemolysis labs, SPEP today.  She in the interim can continue her oral iron supplementation.  I will call her back with the results and discuss additional recommendations.  She is not symptomatic and hemoglobin has relatively stayed the same for many months hence there is no urgent need for blood transfusion.  She and her family expressed understanding of all the recommendations.  She has never had a colonoscopy, we usually recommend screening colonoscopy and everyone over the age of 93 every 10 years at the least.  I have also sent an in basket message to her primary care physician since she had an EF of 45% on her last echo, right lower extremity swelling. I recommended that she see a cardiologist for further evaluation. Thank you for consulting [place] in the care of this patient.  Please do not hesitate  to contact us with any additional questions or concerns.  HISTORY OF PRESENTING ILLNESS:  Jill Munoz 72 y.o. female is here because of normocytic normochromic anemia.  This is a very pleasant 72 year old female patient with past medical history significant for diabetes, recent history of necrotizing fasciitis of the left lower extremity status post AKA arrived to the office today in a wheelchair with her husband and her cousin.  Reason for referral was anemia and thrombocytosis.  She was apparently started on oral iron supplementation recently and has started taking it last Friday.  Prior to that she was taking some over-the-counter iron.  She was also trying to eat more of the lung liver.  She denies any blood loss in her stool or melena.  She has no dietary restrictions.  No history of stomach surgeries.  She is postmenopausal.  Besides anemia, she denies any complaints from it.  She has no fatigue, dizziness, pica, shortness of breath with exertion.  She is mostly limited because of the amputation.  Rest of the pertinent 10 point ROS reviewed and negative   MEDICAL HISTORY:  Past Medical History:  Diagnosis Date   Pre-diabetes     SURGICAL HISTORY: Past Surgical History:  Procedure Laterality Date   AMPUTATION  04/06/2022   Procedure: AMPUTATION ABOVE KNEE REVISION;  Surgeon: Newt Minion, MD;  Location: [facility];  Service: Orthopedics;;   I & D EXTREMITY Left 04/02/2022   Procedure: IRRIGATION AND DEBRIDEMENT LEFT LOWER LEG APPLICATION OF WOUND VAC ;  Surgeon: Georgeanna Harrison, MD;  Location: [facility];  Service: Orthopedics;  Laterality: Left;   I & D EXTREMITY Left 04/04/2022   Procedure: LEFT ABOVE KNEE AMPUTATION;  Surgeon: Newt Minion, MD;  Location: [facility];  Service: Orthopedics;  Laterality: Left;   I & D EXTREMITY Left 04/06/2022   Procedure: LEFT LEG DEBRIDEMENT;  Surgeon: Newt Minion, MD;  Location: [facility];  Service: Orthopedics;  Laterality: Left;   STUMP REVISION Left  04/11/2022   Procedure: REVISION LEFT ABOVE KNEE AMPUTATION;  Surgeon: Newt Minion, MD;  Location: [facility];  Service: Orthopedics;  Laterality: Left;    SOCIAL HISTORY: Social History   Socioeconomic History   Marital status: Married    Spouse name: Not on file   Number of children: Not on file   Years of education: Not on file   Highest education level: Not on file  Occupational History   Not on file  Tobacco Use   Smoking status: Never   Smokeless tobacco: Never  Vaping Use   Vaping Use: Never used  Substance and Sexual Activity   Alcohol use: Never   Drug use: Never   Sexual activity: Not on file  Other Topics Concern   Not on file  Social History Narrative   Not on file   Social Determinants of Health   Financial Resource Strain: Low Risk  (10/09/2022)   Overall Financial Resource Strain (CARDIA)    Difficulty of Paying Living Expenses: Not hard at all  Food Insecurity: No Food Insecurity (10/09/2022)   Hunger Vital Sign    Worried About Running Out of Food in the Last Year: Never true    [place] in the Last Year: Never true  Transportation Needs: No Transportation Needs (10/09/2022)   PRAPARE - Hydrologist (Medical): No    Lack of Transportation (Non-Medical): No  Physical Activity: Inactive (10/09/2022)   Exercise Vital Sign    Days of Exercise per Week: 0 days    Minutes of Exercise per Session: 0 min  Stress: No Stress Concern Present (10/09/2022)   [place]    Feeling of Stress : Not at all  Social Connections: [place] (10/09/2022)   Social Connection and Isolation Panel [NHANES]    Frequency of Communication with Friends and Family: More than three times a week    Frequency of Social Gatherings with Friends and Family: More than three times a week    Attends Religious Services: More than 4 times per year    Active Member of [employer] or  Organizations: Yes    Attends Archivist Meetings: More than 4 times per year    Marital Status: Married  Human resources officer Violence: Not At Risk (10/09/2022)   Humiliation, Afraid, Rape, and Kick questionnaire    Fear of Current or Ex-Partner: No    Emotionally Abused: No    Physically Abused: No    Sexually Abused: No    FAMILY HISTORY: No family history on file.  ALLERGIES:  has No Known Allergies.  MEDICATIONS:  Current Outpatient Medications  Medication Sig Dispense Refill   Ascorbic Acid (VITAMIN C) 1000 MG tablet Take 1,000 mg by mouth daily.     Berberine Chloride 500 MG CAPS Take 1 capsule by mouth in the morning, at noon, and at bedtime.     brimonidine (ALPHAGAN) 0.2 % ophthalmic solution Place 1 drop into both eyes 3 (three) times daily.     dapagliflozin propanediol (FARXIGA)  10 MG TABS tablet TAKE 1 TABLET BY MOUTH DAILY BEFORE BREAKFAST. 90 tablet 1   dapagliflozin propanediol (FARXIGA) 5 MG TABS tablet Take 5 mg by mouth daily.     dorzolamide-timolol (COSOPT) 22.3-6.8 MG/ML ophthalmic solution Place 1 drop into both eyes 2 (two) times daily. 10 mL 12   fluconazole (DIFLUCAN) 150 MG tablet Take 1 tablet every 3 days for 2 doses 2 tablet 0   glucose blood (ACCU-CHEK GUIDE) test strip Use as instructed once a day 100 each 3   lidocaine (XYLOCAINE) 2 % solution Use as directed 15 mLs in the mouth or throat every 4 (four) hours as needed for mouth pain. 15 mL 0   lidocaine (XYLOCAINE) 2 % solution Use as directed 15 mLs in the mouth or throat every 4 (four) hours as needed for mouth pain.  0   melatonin 3 MG TABS tablet Take 3 mg by mouth at bedtime.     pantoprazole (PROTONIX) 40 MG tablet Take 1 tablet (40 mg total) by mouth daily. 90 tablet 1   sennosides-docusate sodium (SENOKOT-S) 8.6-50 MG tablet Take 1 tablet by mouth daily as needed for constipation. 90 tablet 0   sennosides-docusate sodium (SENOKOT-S) 8.6-50 MG tablet Take 1 tablet by mouth daily. 90  tablet 1   No current facility-administered medications for this visit.     PHYSICAL EXAMINATION: ECOG PERFORMANCE STATUS: 0 - Asymptomatic  Vitals:   01/10/23 1103  BP: (!) 166/56  Pulse: 100  Resp: 18  Temp: 97.7 F (36.5 C)  SpO2: 100%   Filed Weights    GENERAL:alert, no distress and comfortable SKIN: skin color, texture, turgor are normal, no rashes or significant lesions EYES: normal, conjunctiva are pink and non-injected, sclera clear OROPHARYNX:no exudate, no erythema and lips, buccal mucosa, and tongue normal  NECK: supple, thyroid normal size, non-tender, without nodularity LYMPH:  no palpable lymphadenopathy in the cervical, axillary or inguinal LUNGS: clear to auscultation and percussion with normal breathing effort HEART: regular rate & rhythm and no murmurs and no lower extremity edema ABDOMEN:abdomen soft, non-tender and normal bowel sounds Musculoskeletal:no cyanosis of digits and no clubbing  PSYCH: alert & oriented x 3 with fluent speech NEURO: no focal motor/sensory deficits  LABORATORY DATA:  I have reviewed the data as listed Lab Results  Component Value Date   WBC 7.8 04/26/2022   HGB 8.4 (L) 04/26/2022   HCT 26.9 (L) 04/26/2022   MCV 89.4 04/26/2022   PLT 661 (H) 04/26/2022     Chemistry      Component Value Date/Time   NA 135 04/26/2022 1554   K 4.2 04/26/2022 1554   CL 103 04/26/2022 1554   CO2 22 04/26/2022 1554   BUN 14 04/26/2022 1554   CREATININE 0.57 04/26/2022 1554      Component Value Date/Time   CALCIUM 8.6 (L) 04/26/2022 1554   ALKPHOS 149 (H) 04/26/2022 1830   AST 23 04/26/2022 1830   ALT 12 04/26/2022 1830   BILITOT 0.6 04/26/2022 1830       RADIOGRAPHIC STUDIES: I have personally reviewed the radiological images as listed and agreed with the findings in the report. No results found.  All questions were answered. The patient knows to call the clinic with any problems, questions or concerns. I spent 45 min in the  care of this patient, reviewed history, physical exam, review of records, counseling and coordination of care.     Benay Pike, MD 01/10/2023 11:22 AM

## 2023-01-11 ENCOUNTER — Encounter: Payer: Self-pay | Admitting: Orthopedic Surgery

## 2023-01-11 ENCOUNTER — Telehealth: Payer: Self-pay | Admitting: Hematology and Oncology

## 2023-01-11 LAB — KAPPA/LAMBDA LIGHT CHAINS
Kappa free light chain: 62.7 mg/L — ABNORMAL HIGH (ref 3.3–19.4)
Kappa, lambda light chain ratio: 2.08 — ABNORMAL HIGH (ref 0.26–1.65)
Lambda free light chains: 30.1 mg/L — ABNORMAL HIGH (ref 5.7–26.3)

## 2023-01-11 NOTE — Telephone Encounter (Signed)
Contacted patient to scheduled appointments. Patient is aware of appointments that are scheduled.   

## 2023-01-14 LAB — PROTEIN ELECTROPHORESIS, SERUM, WITH REFLEX
A/G Ratio: 0.9 (ref 0.7–1.7)
Albumin ELP: 3.7 g/dL (ref 2.9–4.4)
Alpha-1-Globulin: 0.3 g/dL (ref 0.0–0.4)
Alpha-2-Globulin: 0.9 g/dL (ref 0.4–1.0)
Beta Globulin: 1.1 g/dL (ref 0.7–1.3)
Gamma Globulin: 1.6 g/dL (ref 0.4–1.8)
Globulin, Total: 3.9 g/dL (ref 2.2–3.9)
Total Protein ELP: 7.6 g/dL (ref 6.0–8.5)

## 2023-01-14 LAB — FOLATE RBC
Folate, Hemolysate: 434 ng/mL
Folate, RBC: 1601 ng/mL (ref >498)
Hematocrit: 27.1 % — ABNORMAL LOW (ref 34.0–46.6)

## 2023-01-16 NOTE — Progress Notes (Signed)
Received a call from [place] at [facility] requesting latest MD visit including labs. Visit and labs faxed to [phone] with receipt confirmation.

## 2023-01-17 ENCOUNTER — Telehealth: Payer: Self-pay | Admitting: Hematology and Oncology

## 2023-01-18 ENCOUNTER — Inpatient Hospital Stay: Payer: Medicare Other | Admitting: Hematology and Oncology

## 2023-01-18 DIAGNOSIS — R0989 Other specified symptoms and signs involving the circulatory and respiratory systems: Secondary | ICD-10-CM | POA: Diagnosis not present

## 2023-01-23 ENCOUNTER — Telehealth: Payer: Self-pay | Admitting: Orthopedic Surgery

## 2023-01-23 ENCOUNTER — Other Ambulatory Visit: Payer: Self-pay | Admitting: Orthopedic Surgery

## 2023-01-23 NOTE — Telephone Encounter (Signed)
Jill Munoz at [facility] to see if they can take her on for HHPT.

## 2023-01-23 NOTE — Telephone Encounter (Signed)
Patient's husband would like home PT for her. Would like someone to send a referral for home PT. His call back number is [phone]

## 2023-01-24 ENCOUNTER — Inpatient Hospital Stay: Payer: Medicare Other | Attending: Hematology and Oncology | Admitting: Hematology and Oncology

## 2023-01-24 DIAGNOSIS — D649 Anemia, unspecified: Secondary | ICD-10-CM | POA: Diagnosis not present

## 2023-01-24 NOTE — Progress Notes (Signed)
[facility] CONSULT NOTE  Patient Care Team: Karie Georges, MD as PCP - General (Family Medicine)  CHIEF COMPLAINTS/PURPOSE OF CONSULTATION:  Normocytic normochromic anemia.  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   She is here for telephone visit to review labs.  We have discussed that she has some iron deficiency anemia and possibly anemia of acute blood loss anemia was noted after her amputation.  She has started taking oral iron ferrous sulfate once a day and has been tolerating it well.  Rest of the labs show elevated kappa lambda ratio but this is not of clinical significance since both kappa light chains and lambda chains were elevated.  There is no evidence of M protein.  There is no evidence of B12 or folic acid deficiency.  At this time, I have give her two choices, we discussed continuing oral iron for 8 weeks and re assess vs consider IV iron.  She wants to continue oral iron supplementation at this time.  She will return to clinic in June 2024.  Thank you for consulting [place] in the care of this patient.  Please do not hesitate to contact us with any additional questions or concerns.  HISTORY OF PRESENTING ILLNESS:  Jill Munoz 72 y.o. female is here because of normocytic normochromic anemia.  This is a very pleasant 72 year old female patient with past medical history significant for diabetes, recent history of necrotizing fasciitis of the left lower extremity status post AKA who is here for telephone visit to follow-up on iron deficiency anemia.  Since her last visit here she has been taking oral iron supplementation regularly and she denies any new health complaints.  She does not feel unwell.  Rest of the pertinent 10 point ROS reviewed and  negative   MEDICAL HISTORY:  Past Medical History:  Diagnosis Date   Pre-diabetes     SURGICAL HISTORY: Past Surgical History:  Procedure Laterality Date   AMPUTATION  04/06/2022   Procedure: AMPUTATION ABOVE KNEE REVISION;  Surgeon: [name], MD;  Location[facility] OR;  Service: Orthopedics;;   I & D EXTREMITY Left 04/02/2022   Procedure: IRRIGATION AND DEBRIDEMENT LEFT LOWER LEG APPLICATION OF WOUND VAC ;  Surgeon: Ernestina Columbia, MD;  Location: [facility] OR;  Service: Orthopedics;  Laterality: Left;   I & D EXTREMITY Left 04/04/2022   Procedure: LEFT ABOVE KNEE AMPUTATION;  Surgeon: [name], MD;  Location: [facility] OR;  Service: Orthopedics;  Laterality: Left;   I & D EXTREMITY Left 04/06/2022   Procedure: LEFT LEG DEBRIDEMENT;  Surgeon: [name], MD;  Location: [facility] OR;  Service: Orthopedics;  Laterality: Left;   STUMP REVISION Left 04/11/2022   Procedure: REVISION LEFT ABOVE KNEE AMPUTATION;  Surgeon: [name], MD;  Location: [facility] OR;  Service: Orthopedics;  Laterality: Left;    SOCIAL HISTORY: Social History   Socioeconomic History   Marital status: Married    Spouse name: Not on file   Number of children: Not on file   Years of education: Not on file   Highest education level: Not on file  Occupational History   Not on file  Tobacco Use   Smoking status: Never   Smokeless tobacco: Never  Vaping Use   Vaping Use: Never used  Substance and Sexual Activity  Alcohol use: Never   Drug use: Never   Sexual activity: Not on file  Other Topics Concern   Not on file  Social History Narrative   Not on file   Social Determinants of Health   Financial Resource Strain: Low Risk  (10/09/2022)   Overall Financial Resource Strain (CARDIA)    Difficulty of Paying Living Expenses: Not hard at all  Food Insecurity: No Food Insecurity (10/09/2022)   Hunger Vital Sign    Worried About Running Out of Food in the Last Year: Never true    Ran Out of Food in the Last Year: Never  true  Transportation Needs: No Transportation Needs (10/09/2022)   PRAPARE - Administrator, Civil ServiceTransportation    Lack of Transportation (Medical): No    Lack of Transportation (Non-Medical): No  Physical Activity: Inactive (10/09/2022)   Exercise Vital Sign    Days of Exercise per Week: 0 days    Minutes of Exercise per Session: 0 min  Stress: No Stress Concern Present (10/09/2022)   [employer] - Occupational Stress Questionnaire    Feeling of Stress : Not at all  Social Connections: Socially Integrated (10/09/2022)   Social Connection and Isolation Panel [NHANES]    Frequency of Communication with Friends and Family: More than three times a week    Frequency of Social Gatherings with Friends and Family: More than three times a week    Attends Religious Services: More than 4 times per year    Active Member of [employer] or Organizations: Yes    Attends Banker[employer] or Organization Meetings: More than 4 times per year    Marital Status: Married  Catering managerntimate Partner Violence: Not At Risk (10/09/2022)   Humiliation, Afraid, Rape, and Kick questionnaire    Fear of Current or Ex-Partner: No    Emotionally Abused: No    Physically Abused: No    Sexually Abused: No    FAMILY HISTORY: No family history on file.  ALLERGIES:  has No Known Allergies.  MEDICATIONS:  Current Outpatient Medications  Medication Sig Dispense Refill   Ascorbic Acid (VITAMIN C) 1000 MG tablet Take 1,000 mg by mouth daily.     Berberine Chloride 500 MG CAPS Take 1 capsule by mouth in the morning, at noon, and at bedtime.     brimonidine (ALPHAGAN) 0.2 % ophthalmic solution Place 1 drop into both eyes 3 (three) times daily.     dapagliflozin propanediol (FARXIGA) 10 MG TABS tablet TAKE 1 TABLET BY MOUTH DAILY BEFORE BREAKFAST. 90 tablet 1   dapagliflozin propanediol (FARXIGA) 5 MG TABS tablet Take 5 mg by mouth daily.     dorzolamide-timolol (COSOPT) 22.3-6.8 MG/ML ophthalmic solution Place 1 drop into both eyes 2  (two) times daily. 10 mL 12   fluconazole (DIFLUCAN) 150 MG tablet Take 1 tablet every 3 days for 2 doses 2 tablet 0   glucose blood (ACCU-CHEK GUIDE) test strip Use as instructed once a day 100 each 3   lidocaine (XYLOCAINE) 2 % solution Use as directed 15 mLs in the mouth or throat every 4 (four) hours as needed for mouth pain. 15 mL 0   lidocaine (XYLOCAINE) 2 % solution Use as directed 15 mLs in the mouth or throat every 4 (four) hours as needed for mouth pain.  0   melatonin 3 MG TABS tablet Take 3 mg by mouth at bedtime.     pantoprazole (PROTONIX) 40 MG tablet Take 1 tablet (40 mg total) by mouth daily. 90 tablet 1  sennosides-docusate sodium (SENOKOT-S) 8.6-50 MG tablet Take 1 tablet by mouth daily as needed for constipation. 90 tablet 0   sennosides-docusate sodium (SENOKOT-S) 8.6-50 MG tablet Take 1 tablet by mouth daily. 90 tablet 1   No current facility-administered medications for this visit.     PHYSICAL EXAMINATION: ECOG PERFORMANCE STATUS: 0 - Asymptomatic  There were no vitals filed for this visit.  There were no vitals filed for this visit.   Physical exam deferred, telephone visit  LABORATORY DATA:  I have reviewed the data as listed Lab Results  Component Value Date   WBC 5.2 01/10/2023   HGB 8.5 (L) 01/10/2023   HCT 27.1 (L) 01/10/2023   MCV 75.6 (L) 01/10/2023   PLT 393 01/10/2023     Chemistry      Component Value Date/Time   NA 136 01/10/2023 1154   K 4.3 01/10/2023 1154   CL 101 01/10/2023 1154   CO2 27 01/10/2023 1154   BUN 27 (H) 01/10/2023 1154   CREATININE 1.04 (H) 01/10/2023 1154      Component Value Date/Time   CALCIUM 9.5 01/10/2023 1154   ALKPHOS 168 (H) 01/10/2023 1154   AST 11 (L) 01/10/2023 1154   ALT 11 01/10/2023 1154   BILITOT 0.2 (L) 01/10/2023 1154       RADIOGRAPHIC STUDIES: I have personally reviewed the radiological images as listed and agreed with the findings in the report. No results found.  All questions were  answered. The patient knows to call the clinic with any problems, questions or concerns. I spent 12 min in the care of this patient, reviewed history, physical exam, review of records, counseling and coordination of care.  I connected with  Jobe Gibbon on 01/24/23 by a telephone application and verified that I am speaking with the correct person using two identifiers.   I discussed the limitations of evaluation and management by telemedicine. The patient expressed understanding and agreed to proceed.      Rachel Moulds, MD 01/24/2023 4:27 PM

## 2023-01-25 ENCOUNTER — Telehealth: Payer: Self-pay | Admitting: Hematology and Oncology

## 2023-01-25 DIAGNOSIS — Z794 Long term (current) use of insulin: Secondary | ICD-10-CM | POA: Diagnosis not present

## 2023-01-25 DIAGNOSIS — E113513 Type 2 diabetes mellitus with proliferative diabetic retinopathy with macular edema, bilateral: Secondary | ICD-10-CM | POA: Diagnosis not present

## 2023-01-25 NOTE — Telephone Encounter (Signed)
Sent request to Amy with [facility]. Pending

## 2023-01-25 NOTE — Telephone Encounter (Signed)
Jill Munoz with [facility] states they will take this patient for HHPT

## 2023-01-25 NOTE — Telephone Encounter (Signed)
[facility] called today and states they cannot take patient for PT at this time. I will send referral to [facility] for HHPT.

## 2023-01-25 NOTE — Telephone Encounter (Signed)
Lmtcb on husband's number listed below.

## 2023-01-25 NOTE — Telephone Encounter (Signed)
Spoke with patient confirming upcoming appointment  

## 2023-01-28 ENCOUNTER — Encounter: Payer: Medicare Other | Admitting: Physical Therapy

## 2023-01-28 NOTE — Telephone Encounter (Signed)
Jill Munoz is reaching out to pt. Pt's husband has not returned call.

## 2023-01-31 ENCOUNTER — Ambulatory Visit: Payer: Medicaid Other | Admitting: Cardiology

## 2023-01-31 ENCOUNTER — Encounter: Payer: Self-pay | Admitting: Cardiology

## 2023-01-31 VITALS — BP 179/82 | HR 98 | Resp 16 | Ht 61.0 in | Wt 102.0 lb

## 2023-01-31 DIAGNOSIS — Z89612 Acquired absence of left leg above knee: Secondary | ICD-10-CM | POA: Diagnosis not present

## 2023-01-31 DIAGNOSIS — E119 Type 2 diabetes mellitus without complications: Secondary | ICD-10-CM | POA: Diagnosis not present

## 2023-01-31 DIAGNOSIS — Z8739 Personal history of other diseases of the musculoskeletal system and connective tissue: Secondary | ICD-10-CM

## 2023-01-31 DIAGNOSIS — I10 Essential (primary) hypertension: Secondary | ICD-10-CM

## 2023-01-31 DIAGNOSIS — D638 Anemia in other chronic diseases classified elsewhere: Secondary | ICD-10-CM | POA: Diagnosis not present

## 2023-01-31 DIAGNOSIS — I509 Heart failure, unspecified: Secondary | ICD-10-CM

## 2023-01-31 DIAGNOSIS — I700 Atherosclerosis of aorta: Secondary | ICD-10-CM | POA: Diagnosis not present

## 2023-01-31 MED ORDER — TORSEMIDE 20 MG PO TABS
20.0000 mg | ORAL_TABLET | Freq: Every morning | ORAL | 0 refills | Status: DC
Start: 2023-01-31 — End: 2023-02-08

## 2023-01-31 MED ORDER — LOSARTAN POTASSIUM 50 MG PO TABS
50.0000 mg | ORAL_TABLET | Freq: Every day | ORAL | 0 refills | Status: DC
Start: 2023-01-31 — End: 2023-02-22

## 2023-01-31 NOTE — Progress Notes (Signed)
ID:  Jill Munoz, DOB Oct 25, 1950, MRN [id number]  PCP:  Karie Georges, MD  Cardiologist:  Tessa Lerner, DO, [facility] (established care 01/31/23)  REASON FOR CONSULT: Evaluate for CHF  REQUESTING PHYSICIAN:  Karie Georges, MD [address] [place],  [place] [address]  Chief Complaint  Patient presents with   Congestive Heart Failure   New Patient (Initial Visit)    HPI  Jill Munoz is a 72 y.o. [place] descent female who presents to the clinic for evaluation of new onset of congestive heart failure at the request of Karie Georges, MD. Her past medical history and cardiovascular risk factors include:  Aortic atherosclerosis, GERD, glaucoma, Normocytic normochromic anemia, iron deficiency, non-insulin-dependent diabetes mellitus type 2, history of necrotizing fasciitis status post left AKA.  Patient is accompanied by her husband and cousin at today's office visit.  Patient speaks English minimally but is able to answer a few questions.  Husband and cousin provide translation services during today's encounter.  Patient states that she was referred to hematology for evaluation of anemia.  However there is concern for possible new onset of congestive heart failure and is referred to cardiology for further evaluation and management.  Patient states that she underwent left AKA surgery last year in 2023 and since then has not been back to baseline.  She recently started sleeping upright at 45 degrees, gets very symptomatic with dyspnea if she lays flat, has lower extremity swelling more prominent on the right lower extremity, and shortness of breath with overexertion.  Her overall physical capacity is limited as she is wheelchair-bound due to left AKA.  She does not check her weights at home.  2 months ago she used to weigh 102 pounds.  Due to risk for falls her weight was not checked at today's office visit.  She does not check her blood pressures at home or blood  glucose.  Echocardiogram from 2023 notes mildly reduced LVEF, grade 1 diastolic dysfunction, and LVH.  FUNCTIONAL STATUS: Limited - in wheelchair she is s/p left AKA awaiting prosthetic leg.   ALLERGIES: No Known Allergies  MEDICATION LIST PRIOR TO VISIT: Current Meds  Medication Sig   Ascorbic Acid (VITAMIN C) 1000 MG tablet Take 1,000 mg by mouth daily.   Berberine Chloride 500 MG CAPS Take 1 capsule by mouth in the morning, at noon, and at bedtime.   brimonidine (ALPHAGAN) 0.2 % ophthalmic solution Place 1 drop into both eyes 3 (three) times daily.   dapagliflozin propanediol (FARXIGA) 10 MG TABS tablet TAKE 1 TABLET BY MOUTH DAILY BEFORE BREAKFAST.   dorzolamide-timolol (COSOPT) 22.3-6.8 MG/ML ophthalmic solution Place 1 drop into both eyes 2 (two) times daily.   glucose blood (ACCU-CHEK GUIDE) test strip Use as instructed once a day   lidocaine (XYLOCAINE) 2 % solution Use as directed 15 mLs in the mouth or throat every 4 (four) hours as needed for mouth pain.   losartan (COZAAR) 50 MG tablet Take 1 tablet (50 mg total) by mouth daily at 10 pm.   melatonin 3 MG TABS tablet Take 3 mg by mouth at bedtime.   pantoprazole (PROTONIX) 40 MG tablet Take 1 tablet (40 mg total) by mouth daily.   sennosides-docusate sodium (SENOKOT-S) 8.6-50 MG tablet Take 1 tablet by mouth daily as needed for constipation.   torsemide (DEMADEX) 20 MG tablet Take 1 tablet (20 mg total) by mouth in the morning.     PAST MEDICAL HISTORY: Past Medical History:  Diagnosis Date  Anemia    Diabetes mellitus without complication    Pre-diabetes     PAST SURGICAL HISTORY: Past Surgical History:  Procedure Laterality Date   AMPUTATION  04/06/2022   Procedure: AMPUTATION ABOVE KNEE REVISION;  Surgeon: [name], MD;  Location: [facility] OR;  Service: Orthopedics;;   I & D EXTREMITY Left 04/02/2022   Procedure: IRRIGATION AND DEBRIDEMENT LEFT LOWER LEG APPLICATION OF WOUND VAC ;  Surgeon: Ernestina Columbia, MD;   Location: [facility] OR;  Service: Orthopedics;  Laterality: Left;   I & D EXTREMITY Left 04/04/2022   Procedure: LEFT ABOVE KNEE AMPUTATION;  Surgeon: [name], MD;  Location: [facility] OR;  Service: Orthopedics;  Laterality: Left;   I & D EXTREMITY Left 04/06/2022   Procedure: LEFT LEG DEBRIDEMENT;  Surgeon: [name], MD;  Location: [facility] OR;  Service: Orthopedics;  Laterality: Left;   STUMP REVISION Left 04/11/2022   Procedure: REVISION LEFT ABOVE KNEE AMPUTATION;  Surgeon: [name], MD;  Location: [facility] OR;  Service: Orthopedics;  Laterality: Left;    FAMILY HISTORY: The patient family history includes Heart disease in her mother.  SOCIAL HISTORY:  The patient  reports that she has never smoked. She has never used smokeless tobacco. She reports that she does not drink alcohol and does not use drugs.  REVIEW OF SYSTEMS: Review of Systems  Constitutional: Positive for weight gain.  Cardiovascular:  Positive for leg swelling, orthopnea and paroxysmal nocturnal dyspnea. Negative for chest pain, claudication, dyspnea on exertion, irregular heartbeat, near-syncope, palpitations and syncope.  Respiratory:  Negative for shortness of breath.   Hematologic/Lymphatic: Negative for bleeding problem.  Musculoskeletal:  Negative for muscle cramps and myalgias.  Neurological:  Negative for dizziness and light-headedness.    PHYSICAL EXAM:    01/31/2023   10:56 AM 01/31/2023   10:51 AM 01/10/2023   11:03 AM  Vitals with BMI  Height     Weight  102 lbs   BMI  19.28   Systolic 179 181 696  Diastolic 82 86 56  Pulse 98 97 100    Physical Exam  Constitutional: No distress.  Age appropriate, hemodynamically stable.   Neck: No JVD present.  Cardiovascular: Normal rate, regular rhythm, S1 normal, S2 normal, intact distal pulses and normal pulses. Exam reveals no gallop, no S3 and no S4.  No murmur heard. Pulmonary/Chest: Effort normal and breath sounds normal. No stridor. She has no  wheezes. She has no rales.  Abdominal: Soft. Bowel sounds are normal. She exhibits no distension. There is no abdominal tenderness.  Musculoskeletal:        General: No edema.     Cervical back: Neck supple.  Neurological: She is alert and oriented to person, place, and time. She has intact cranial nerves (2-12).  Skin: Skin is warm and moist.   CARDIAC DATABASE: EKG: January 31, 2023 sinus rhythm, 100 bpm, LAE, baseline artifact, without underlying injury pattern.  Echocardiogram: 04/03/2022:  1. The mitral valve is abnormally thickened. Though morphology favors  calcification, cannot exclude small vegetation (study indication is  bacteremia). TEE may be considered. No evidence of mitral valve  regurgitation.   2. Left ventricular ejection fraction, by estimation, is 40 to 45%. The  left ventricle has mildly decreased function. The left ventricle  demonstrates global hypokinesis. There is severe asymmetric left  ventricular hypertrophy of the basal-septal  segment. Left ventricular diastolic parameters are consistent with Grade I  diastolic dysfunction (impaired relaxation).   3. Right  ventricular systolic function is normal. The right ventricular  size is normal. Tricuspid regurgitation signal is inadequate for assessing  PA pressure.   4. The aortic valve is tricuspid. Aortic valve regurgitation is not  visualized.    Stress Testing: No results found for this or any previous visit from the past 1095 days.   Heart Catheterization: None  LABORATORY DATA:    Latest Ref Rng & Units 01/10/2023   11:55 AM 01/10/2023   11:54 AM 04/26/2022    3:54 PM  CBC  WBC 4.0 - 10.5 K/uL  5.2  7.8   Hemoglobin 12.0 - 15.0 g/dL  8.5  8.4   Hematocrit 34.0 - 46.6 % 27.1  26.4  26.9   Platelets 150 - 400 K/uL  393  661        Latest Ref Rng & Units 01/10/2023   11:54 AM 04/26/2022    6:30 PM 04/26/2022    3:54 PM  CMP  Glucose 70 - 99 mg/dL 578   469   BUN 8 - 23 mg/dL 27   14    Creatinine 6.29 - 1.00 mg/dL 5.28   4.13   Sodium 244 - 145 mmol/L 136   135   Potassium 3.5 - 5.1 mmol/L 4.3   4.2   Chloride 98 - 111 mmol/L 101   103   CO2 22 - 32 mmol/L 27   22   Calcium 8.9 - 10.3 mg/dL 9.5   8.6   Total Protein 6.5 - 8.1 g/dL 8.0  7.1    Total Bilirubin 0.3 - 1.2 mg/dL 0.2  0.6    Alkaline Phos 38 - 126 U/L 168  149    AST 15 - 41 U/L 11  23    ALT 0 - 44 U/L 11  12      Lipid Panel     Component Value Date/Time   CHOL 87 04/02/2022 1556   TRIG 150 (H) 04/02/2022 1556   HDL 16 (L) 04/02/2022 1556   CHOLHDL 5.4 04/02/2022 1556   VLDL 30 04/02/2022 1556   LDLCALC 41 04/02/2022 1556    No components found for: "NTPROBNP" No results for input(s): "PROBNP" in the last 8760 hours. Recent Labs    04/03/22 0503  TSH 0.861    BMP Recent Labs    04/14/22 0141 04/26/22 1554 01/10/23 1154  NA 133* 135 136  K 3.8 4.2 4.3  CL 99 103 101  CO2 GLUCOSE 226* 207* 303*  BUN 16 14 27*  CREATININE 0.62 0.57 1.04*  CALCIUM 7.7* 8.6* 9.5  GFRNONAA >60 >60 58*    HEMOGLOBIN A1C Lab Results  Component Value Date   HGBA1C 7.7 (A) 08/21/2022   MPG 269 04/02/2022   External Labs: Collected: 01/03/2023 Hemoglobin 8.3, hematocrit 25.9%. BUN 24, creatinine 0.87. Sodium 134, potassium 4.7, chloride 99, bicarb 27 AST 10, ALT 11, alkaline phosphatase 164 Hemoglobin A1c 9.7  IMPRESSION:    ICD-10-CM   1. New onset of congestive heart failure  I50.9 EKG 12-Lead    PCV ECHOCARDIOGRAM COMPLETE    Pro b natriuretic peptide (BNP)    Basic metabolic panel    Magnesium    torsemide (DEMADEX) 20 MG tablet    losartan (COZAAR) 50 MG tablet    2. Benign hypertension  I10     3. Non-insulin dependent type 2 diabetes mellitus  E11.9     4. Atherosclerosis of aorta  I70.0     5. Anemia  of chronic disease  D63.8     6. History of necrotizing fasciitis  Z87.39     7. Hx of AKA (above knee amputation), left  Z89.612     8. Congestive heart failure,  unspecified HF chronicity, unspecified heart failure type  I50.9        RECOMMENDATIONS: Jill Munoz is a 72 y.o. [place] descent female whose past medical history and cardiac risk factors include: Aortic atherosclerosis, GERD, glaucoma, Normocytic normochromic anemia, iron deficiency, non-insulin-dependent diabetes mellitus type 2, history of necrotizing fasciitis status post left AKA.  New onset of congestive heart failure Based on symptoms and prior echocardiograms I suspect she has acute systolic and diastolic heart failure. Would like to focus on volume management prior to up titration of GDMT. Check labs to get a baseline BNP level. Start torsemide 20 mg p.o. every morning Start losartan 50 mg p.o. every afternoon Reemphasized the importance of daily weights, strict I's and O's, and reducing salt in the diet. Echo will be ordered to evaluate for structural heart disease and left ventricular systolic function. Once she is euvolemic would recommend stress test to evaluate for ischemic burden given the poorly controlled diabetes, history of necrotizing fasciitis, uncontrolled hypertension, reduced LVEF as of 2023. Unfortunately, I feel she has poor insight in her medical history/comorbid conditions.  Patient states that she wants to come off of all pills and treat these comorbidities with dietary changes.  Benign hypertension Office blood pressures are not well-controlled Repeat blood pressures are also similar. Start losartan given her comorbid conditions Educated her on getting a blood pressure cuff and checking it twice a day and to bring in her numbers at the next visit  Non-insulin dependent type 2 diabetes mellitus Hemoglobin A1c not well-controlled. Currently on metformin and Jardiance. Given the fact that she has poorly controlled diabetes and her ASCVD risk or recommended statin therapy-but patient is reluctant to be on so any pharmacological agents.   Will reevaluate at  the next visit In the interim, if she has fasting lipids with PCP recommend checking direct LDL and lipid profile  Anemia of chronic disease Currently on iron supplementation. Was offered iron infusion by hematology oncology.  Hx of AKA (above knee amputation), left Recently did get approved for prosthetic leg. Will shortly start rehab.  Data Reviewed: I have independently reviewed external notes provided by the referring provider as part of this office visit.   I have independently reviewed results of EKG, labs, prior echocardiogram, most recent hematology progress note as part of medical decision making. I have ordered the following tests:  Orders Placed This Encounter  Procedures   Pro b natriuretic peptide (BNP)   Basic metabolic panel   Magnesium   EKG 12-Lead   PCV ECHOCARDIOGRAM COMPLETE    Standing Status:   Future    Standing Expiration Date:   01/31/2024   I have made medications changes at today's encounter as noted above. History of present illness was obtained by both patient and husband, cousin at today's office visit.    FINAL MEDICATION LIST END OF ENCOUNTER: Meds ordered this encounter  Medications   torsemide (DEMADEX) 20 MG tablet    Sig: Take 1 tablet (20 mg total) by mouth in the morning.    Dispense:  30 tablet    Refill:  0   losartan (COZAAR) 50 MG tablet    Sig: Take 1 tablet (50 mg total) by mouth daily at 10 pm.    Dispense:  30 tablet  Refill:  0    Medications Discontinued During This Encounter  Medication Reason   dapagliflozin propanediol (FARXIGA) 5 MG TABS tablet    sennosides-docusate sodium (SENOKOT-S) 8.6-50 MG tablet    fluconazole (DIFLUCAN) 150 MG tablet    lidocaine (XYLOCAINE) 2 % solution      Current Outpatient Medications:    Ascorbic Acid (VITAMIN C) 1000 MG tablet, Take 1,000 mg by mouth daily., Disp: , Rfl:    Berberine Chloride 500 MG CAPS, Take 1 capsule by mouth in the morning, at noon, and at bedtime., Disp: , Rfl:     brimonidine (ALPHAGAN) 0.2 % ophthalmic solution, Place 1 drop into both eyes 3 (three) times daily., Disp: , Rfl:    dapagliflozin propanediol (FARXIGA) 10 MG TABS tablet, TAKE 1 TABLET BY MOUTH DAILY BEFORE BREAKFAST., Disp: 90 tablet, Rfl: 1   dorzolamide-timolol (COSOPT) 22.3-6.8 MG/ML ophthalmic solution, Place 1 drop into both eyes 2 (two) times daily., Disp: 10 mL, Rfl: 12   glucose blood (ACCU-CHEK GUIDE) test strip, Use as instructed once a day, Disp: 100 each, Rfl: 3   lidocaine (XYLOCAINE) 2 % solution, Use as directed 15 mLs in the mouth or throat every 4 (four) hours as needed for mouth pain., Disp: 15 mL, Rfl: 0   losartan (COZAAR) 50 MG tablet, Take 1 tablet (50 mg total) by mouth daily at 10 pm., Disp: 30 tablet, Rfl: 0   melatonin 3 MG TABS tablet, Take 3 mg by mouth at bedtime., Disp: , Rfl:    pantoprazole (PROTONIX) 40 MG tablet, Take 1 tablet (40 mg total) by mouth daily., Disp: 90 tablet, Rfl: 1   sennosides-docusate sodium (SENOKOT-S) 8.6-50 MG tablet, Take 1 tablet by mouth daily as needed for constipation., Disp: 90 tablet, Rfl: 0   torsemide (DEMADEX) 20 MG tablet, Take 1 tablet (20 mg total) by mouth in the morning., Disp: 30 tablet, Rfl: 0  Orders Placed This Encounter  Procedures   Pro b natriuretic peptide (BNP)   Basic metabolic panel   Magnesium   EKG 12-Lead   PCV ECHOCARDIOGRAM COMPLETE    There are no Patient Instructions on file for this visit.   --Continue cardiac medications as reconciled in final medication list. --Return in about 3 weeks (around 02/21/2023) for Follow up work for HF. or sooner if needed. --Continue follow-up with your primary care physician regarding the management of your other chronic comorbid conditions.  Patient's questions and concerns were addressed to her satisfaction. She voices understanding of the instructions provided during this encounter.   This note was created using a voice recognition software as a result there may  be grammatical errors inadvertently enclosed that do not reflect the nature of this encounter. Every attempt is made to correct such errors.  Tessa Lerner, [place], [facility]  Pager:  [phone] Office: [phone]

## 2023-02-01 ENCOUNTER — Other Ambulatory Visit: Payer: Self-pay

## 2023-02-01 ENCOUNTER — Other Ambulatory Visit: Payer: Self-pay | Admitting: Orthopedic Surgery

## 2023-02-01 ENCOUNTER — Telehealth: Payer: Self-pay | Admitting: Orthopedic Surgery

## 2023-02-01 DIAGNOSIS — I10 Essential (primary) hypertension: Secondary | ICD-10-CM

## 2023-02-01 DIAGNOSIS — I509 Heart failure, unspecified: Secondary | ICD-10-CM

## 2023-02-01 DIAGNOSIS — S78112A Complete traumatic amputation at level between left hip and knee, initial encounter: Secondary | ICD-10-CM

## 2023-02-01 LAB — BASIC METABOLIC PANEL
BUN/Creatinine Ratio: 31 — ABNORMAL HIGH (ref 12–28)
BUN: 28 mg/dL — ABNORMAL HIGH (ref 8–27)
CO2: 21 mmol/L (ref 20–29)
Calcium: 9.5 mg/dL (ref 8.7–10.3)
Chloride: 101 mmol/L (ref 96–106)
Creatinine, Ser: 0.91 mg/dL (ref 0.57–1.00)
Glucose: 246 mg/dL — ABNORMAL HIGH (ref 70–99)
Potassium: 4.3 mmol/L (ref 3.5–5.2)
Sodium: 138 mmol/L (ref 134–144)
eGFR: 68 mL/min/{1.73_m2} (ref >59)

## 2023-02-01 LAB — MAGNESIUM: Magnesium: 2.2 mg/dL (ref 1.6–2.3)

## 2023-02-01 LAB — PRO B NATRIURETIC PEPTIDE: NT-Pro BNP: 300 pg/mL (ref 0–301)

## 2023-02-01 NOTE — Telephone Encounter (Signed)
Left a very detailed message on husbands VM (verified name on greeting), about how the original order didn't get processed last Friday. They are working on this now and for him to answer any numbers he might not know due to them calling him to schedule HHPT.

## 2023-02-01 NOTE — Telephone Encounter (Signed)
SW Clydie Braun at [facility], she says that they do not have a referral for this pt. I emailed Amy with [facility] last week and she said that they can take her on as a PT patient. Wasn't aware if I had to send in a referral as well for this as nothing was sent to them when I got the approval. I am sending in a PT referral to fax # [phone] and the earliest they can see her is 02/06/23 (short staffed on PT)

## 2023-02-01 NOTE — Telephone Encounter (Signed)
Referral, notes, demo sheet faxed to fax # below.

## 2023-02-01 NOTE — Telephone Encounter (Signed)
Patient's husband called. Says he still hasn't heard anything from home agency. He would like a call back. [phone]

## 2023-02-01 NOTE — Progress Notes (Signed)
Called patient husband and informed him about patient lab and medication changes. Patient husband understood

## 2023-02-04 DIAGNOSIS — M726 Necrotizing fasciitis: Secondary | ICD-10-CM | POA: Diagnosis not present

## 2023-02-04 DIAGNOSIS — R262 Difficulty in walking, not elsewhere classified: Secondary | ICD-10-CM | POA: Diagnosis not present

## 2023-02-04 DIAGNOSIS — I509 Heart failure, unspecified: Secondary | ICD-10-CM | POA: Diagnosis not present

## 2023-02-04 DIAGNOSIS — M6281 Muscle weakness (generalized): Secondary | ICD-10-CM | POA: Diagnosis not present

## 2023-02-04 DIAGNOSIS — R1312 Dysphagia, oropharyngeal phase: Secondary | ICD-10-CM | POA: Diagnosis not present

## 2023-02-04 DIAGNOSIS — M6259 Muscle wasting and atrophy, not elsewhere classified, multiple sites: Secondary | ICD-10-CM | POA: Diagnosis not present

## 2023-02-04 DIAGNOSIS — J952 Acute pulmonary insufficiency following nonthoracic surgery: Secondary | ICD-10-CM | POA: Diagnosis not present

## 2023-02-06 ENCOUNTER — Telehealth: Payer: Self-pay

## 2023-02-06 ENCOUNTER — Telehealth: Payer: Self-pay | Admitting: Orthopedic Surgery

## 2023-02-06 NOTE — Telephone Encounter (Signed)
Jill Munoz informed okay of start of date.

## 2023-02-06 NOTE — Telephone Encounter (Signed)
Please let her know given her comorbidities for blood pressure 131/68 is considered normal. She is supposed to have labs in 1 week after the initiation of therapy as discussed on the prior result note dated 01/31/2023.  Seven Marengo [place], DO, [facility]

## 2023-02-06 NOTE — Telephone Encounter (Signed)
Patients husband called BP is 131/68, I had a hard time understanding him but I think he was asking if the pills re necessary he thinks her BP is too low. Please advise

## 2023-02-06 NOTE — Telephone Encounter (Signed)
Patient's husband aware °

## 2023-02-06 NOTE — Telephone Encounter (Signed)
[facility] asking for verbal orders. Start of da stating 02/07/23 advising that patinet asked for another day before starting.Jill Munoz) [phone]

## 2023-02-07 ENCOUNTER — Telehealth: Payer: Self-pay | Admitting: Orthopedic Surgery

## 2023-02-07 ENCOUNTER — Other Ambulatory Visit: Payer: Medicaid Other

## 2023-02-07 DIAGNOSIS — D638 Anemia in other chronic diseases classified elsewhere: Secondary | ICD-10-CM | POA: Diagnosis not present

## 2023-02-07 DIAGNOSIS — E1159 Type 2 diabetes mellitus with other circulatory complications: Secondary | ICD-10-CM | POA: Diagnosis not present

## 2023-02-07 DIAGNOSIS — Z89612 Acquired absence of left leg above knee: Secondary | ICD-10-CM | POA: Diagnosis not present

## 2023-02-07 DIAGNOSIS — Z7984 Long term (current) use of oral hypoglycemic drugs: Secondary | ICD-10-CM | POA: Diagnosis not present

## 2023-02-07 DIAGNOSIS — Z4781 Encounter for orthopedic aftercare following surgical amputation: Secondary | ICD-10-CM | POA: Diagnosis not present

## 2023-02-07 DIAGNOSIS — E1165 Type 2 diabetes mellitus with hyperglycemia: Secondary | ICD-10-CM | POA: Diagnosis not present

## 2023-02-07 DIAGNOSIS — I10 Essential (primary) hypertension: Secondary | ICD-10-CM | POA: Diagnosis not present

## 2023-02-07 NOTE — Telephone Encounter (Signed)
Betsy given VO approval 

## 2023-02-07 NOTE — Telephone Encounter (Signed)
[facility] asking for verbal orders/ O/T therapy evaluation for Strengthen/ ADL Tamela Oddi) [phone]

## 2023-02-08 ENCOUNTER — Other Ambulatory Visit: Payer: Self-pay | Admitting: Cardiology

## 2023-02-08 DIAGNOSIS — N179 Acute kidney failure, unspecified: Secondary | ICD-10-CM

## 2023-02-08 DIAGNOSIS — I10 Essential (primary) hypertension: Secondary | ICD-10-CM

## 2023-02-08 LAB — BMP8+EGFR
BUN/Creatinine Ratio: 25 (ref 12–28)
BUN: 37 mg/dL — ABNORMAL HIGH (ref 8–27)
CO2: 23 mmol/L (ref 20–29)
Calcium: 9.7 mg/dL (ref 8.7–10.3)
Chloride: 97 mmol/L (ref 96–106)
Creatinine, Ser: 1.49 mg/dL — ABNORMAL HIGH (ref 0.57–1.00)
Glucose: 240 mg/dL — ABNORMAL HIGH (ref 70–99)
Potassium: 4.7 mmol/L (ref 3.5–5.2)
Sodium: 137 mmol/L (ref 134–144)
eGFR: 38 mL/min/{1.73_m2} — ABNORMAL LOW

## 2023-02-08 LAB — PRO B NATRIURETIC PEPTIDE: NT-Pro BNP: 168 pg/mL (ref 0–301)

## 2023-02-11 DIAGNOSIS — E113513 Type 2 diabetes mellitus with proliferative diabetic retinopathy with macular edema, bilateral: Secondary | ICD-10-CM | POA: Diagnosis not present

## 2023-02-11 DIAGNOSIS — Z794 Long term (current) use of insulin: Secondary | ICD-10-CM | POA: Diagnosis not present

## 2023-02-13 ENCOUNTER — Telehealth: Payer: Self-pay

## 2023-02-13 NOTE — Telephone Encounter (Signed)
Make sure they have stopped torsemide.  Continue farxgia.  Reduce losartan 25mg  po qPM and hold if SPB <173mmHg.  Labs this Friday.   Lilou Kneip [place], DO, [facility]

## 2023-02-13 NOTE — Telephone Encounter (Signed)
Patients husband is calling to let us know AM BP was 107/64. He said you asked him to call with this information.

## 2023-02-13 NOTE — Telephone Encounter (Signed)
LMTCB for this info

## 2023-02-14 DIAGNOSIS — Z7984 Long term (current) use of oral hypoglycemic drugs: Secondary | ICD-10-CM | POA: Diagnosis not present

## 2023-02-14 DIAGNOSIS — E1165 Type 2 diabetes mellitus with hyperglycemia: Secondary | ICD-10-CM | POA: Diagnosis not present

## 2023-02-14 DIAGNOSIS — Z4781 Encounter for orthopedic aftercare following surgical amputation: Secondary | ICD-10-CM | POA: Diagnosis not present

## 2023-02-14 DIAGNOSIS — D638 Anemia in other chronic diseases classified elsewhere: Secondary | ICD-10-CM | POA: Diagnosis not present

## 2023-02-14 DIAGNOSIS — I10 Essential (primary) hypertension: Secondary | ICD-10-CM | POA: Diagnosis not present

## 2023-02-14 DIAGNOSIS — Z89612 Acquired absence of left leg above knee: Secondary | ICD-10-CM | POA: Diagnosis not present

## 2023-02-14 DIAGNOSIS — E1159 Type 2 diabetes mellitus with other circulatory complications: Secondary | ICD-10-CM | POA: Diagnosis not present

## 2023-02-15 ENCOUNTER — Telehealth: Payer: Self-pay

## 2023-02-15 DIAGNOSIS — I10 Essential (primary) hypertension: Secondary | ICD-10-CM | POA: Diagnosis not present

## 2023-02-15 DIAGNOSIS — Z89612 Acquired absence of left leg above knee: Secondary | ICD-10-CM | POA: Diagnosis not present

## 2023-02-15 DIAGNOSIS — N179 Acute kidney failure, unspecified: Secondary | ICD-10-CM | POA: Diagnosis not present

## 2023-02-15 DIAGNOSIS — E1159 Type 2 diabetes mellitus with other circulatory complications: Secondary | ICD-10-CM | POA: Diagnosis not present

## 2023-02-15 DIAGNOSIS — Z7984 Long term (current) use of oral hypoglycemic drugs: Secondary | ICD-10-CM | POA: Diagnosis not present

## 2023-02-15 DIAGNOSIS — D638 Anemia in other chronic diseases classified elsewhere: Secondary | ICD-10-CM | POA: Diagnosis not present

## 2023-02-15 DIAGNOSIS — E1165 Type 2 diabetes mellitus with hyperglycemia: Secondary | ICD-10-CM | POA: Diagnosis not present

## 2023-02-15 DIAGNOSIS — Z4781 Encounter for orthopedic aftercare following surgical amputation: Secondary | ICD-10-CM | POA: Diagnosis not present

## 2023-02-15 NOTE — Telephone Encounter (Signed)
I called [facility] and advised.

## 2023-02-15 NOTE — Telephone Encounter (Signed)
Christy, RN with [facility] would like an verbal order to see patient next week for OT.  Cb# [phone].  Please advise.  Thank you.

## 2023-02-15 NOTE — Telephone Encounter (Signed)
Ok with us.. 

## 2023-02-16 LAB — BASIC METABOLIC PANEL
BUN/Creatinine Ratio: 31 — ABNORMAL HIGH (ref 12–28)
BUN: 33 mg/dL — ABNORMAL HIGH (ref 8–27)
CO2: 21 mmol/L (ref 20–29)
Calcium: 9.7 mg/dL (ref 8.7–10.3)
Chloride: 101 mmol/L (ref 96–106)
Creatinine, Ser: 1.05 mg/dL — ABNORMAL HIGH (ref 0.57–1.00)
Glucose: 250 mg/dL — ABNORMAL HIGH (ref 70–99)
Potassium: 4.6 mmol/L (ref 3.5–5.2)
Sodium: 138 mmol/L (ref 134–144)
eGFR: 58 mL/min/{1.73_m2} — ABNORMAL LOW (ref >59)

## 2023-02-18 DIAGNOSIS — D638 Anemia in other chronic diseases classified elsewhere: Secondary | ICD-10-CM | POA: Diagnosis not present

## 2023-02-18 DIAGNOSIS — Z4781 Encounter for orthopedic aftercare following surgical amputation: Secondary | ICD-10-CM | POA: Diagnosis not present

## 2023-02-18 DIAGNOSIS — Z89612 Acquired absence of left leg above knee: Secondary | ICD-10-CM | POA: Diagnosis not present

## 2023-02-18 DIAGNOSIS — I10 Essential (primary) hypertension: Secondary | ICD-10-CM | POA: Diagnosis not present

## 2023-02-18 DIAGNOSIS — E1165 Type 2 diabetes mellitus with hyperglycemia: Secondary | ICD-10-CM | POA: Diagnosis not present

## 2023-02-18 DIAGNOSIS — Z7984 Long term (current) use of oral hypoglycemic drugs: Secondary | ICD-10-CM | POA: Diagnosis not present

## 2023-02-18 DIAGNOSIS — E1159 Type 2 diabetes mellitus with other circulatory complications: Secondary | ICD-10-CM | POA: Diagnosis not present

## 2023-02-19 DIAGNOSIS — I10 Essential (primary) hypertension: Secondary | ICD-10-CM | POA: Diagnosis not present

## 2023-02-19 DIAGNOSIS — E1159 Type 2 diabetes mellitus with other circulatory complications: Secondary | ICD-10-CM | POA: Diagnosis not present

## 2023-02-19 DIAGNOSIS — E1165 Type 2 diabetes mellitus with hyperglycemia: Secondary | ICD-10-CM | POA: Diagnosis not present

## 2023-02-19 DIAGNOSIS — Z89612 Acquired absence of left leg above knee: Secondary | ICD-10-CM | POA: Diagnosis not present

## 2023-02-19 DIAGNOSIS — Z4781 Encounter for orthopedic aftercare following surgical amputation: Secondary | ICD-10-CM | POA: Diagnosis not present

## 2023-02-19 DIAGNOSIS — Z7984 Long term (current) use of oral hypoglycemic drugs: Secondary | ICD-10-CM | POA: Diagnosis not present

## 2023-02-19 DIAGNOSIS — D638 Anemia in other chronic diseases classified elsewhere: Secondary | ICD-10-CM | POA: Diagnosis not present

## 2023-02-19 NOTE — Progress Notes (Signed)
Called patient, NA, mailbox, cannot leave a message.

## 2023-02-19 NOTE — Progress Notes (Signed)
Patient called back, I have discussed results with husband.

## 2023-02-21 ENCOUNTER — Ambulatory Visit: Payer: Medicaid Other | Admitting: Cardiology

## 2023-02-22 ENCOUNTER — Other Ambulatory Visit: Payer: Self-pay | Admitting: Cardiology

## 2023-02-22 DIAGNOSIS — D638 Anemia in other chronic diseases classified elsewhere: Secondary | ICD-10-CM | POA: Diagnosis not present

## 2023-02-22 DIAGNOSIS — Z89612 Acquired absence of left leg above knee: Secondary | ICD-10-CM | POA: Diagnosis not present

## 2023-02-22 DIAGNOSIS — E1159 Type 2 diabetes mellitus with other circulatory complications: Secondary | ICD-10-CM | POA: Diagnosis not present

## 2023-02-22 DIAGNOSIS — Z4781 Encounter for orthopedic aftercare following surgical amputation: Secondary | ICD-10-CM | POA: Diagnosis not present

## 2023-02-22 DIAGNOSIS — Z7984 Long term (current) use of oral hypoglycemic drugs: Secondary | ICD-10-CM | POA: Diagnosis not present

## 2023-02-22 DIAGNOSIS — I10 Essential (primary) hypertension: Secondary | ICD-10-CM | POA: Diagnosis not present

## 2023-02-22 DIAGNOSIS — I509 Heart failure, unspecified: Secondary | ICD-10-CM

## 2023-02-22 DIAGNOSIS — E1165 Type 2 diabetes mellitus with hyperglycemia: Secondary | ICD-10-CM | POA: Diagnosis not present

## 2023-02-26 ENCOUNTER — Telehealth: Payer: Self-pay

## 2023-02-26 DIAGNOSIS — I10 Essential (primary) hypertension: Secondary | ICD-10-CM | POA: Diagnosis not present

## 2023-02-26 DIAGNOSIS — D638 Anemia in other chronic diseases classified elsewhere: Secondary | ICD-10-CM | POA: Diagnosis not present

## 2023-02-26 DIAGNOSIS — Z7984 Long term (current) use of oral hypoglycemic drugs: Secondary | ICD-10-CM | POA: Diagnosis not present

## 2023-02-26 DIAGNOSIS — E1165 Type 2 diabetes mellitus with hyperglycemia: Secondary | ICD-10-CM | POA: Diagnosis not present

## 2023-02-26 DIAGNOSIS — E1159 Type 2 diabetes mellitus with other circulatory complications: Secondary | ICD-10-CM | POA: Diagnosis not present

## 2023-02-26 DIAGNOSIS — Z4781 Encounter for orthopedic aftercare following surgical amputation: Secondary | ICD-10-CM | POA: Diagnosis not present

## 2023-02-26 DIAGNOSIS — Z89612 Acquired absence of left leg above knee: Secondary | ICD-10-CM | POA: Diagnosis not present

## 2023-02-26 NOTE — Telephone Encounter (Signed)
Charisse from [facility] called to inform us that patient bp before PT was 170/80 and after PT bp was 140/80. Charisse mention that patient stated that she does not like to take her Losartan due that it make her feel odd and not able to sleep. Patient did not take her Losartan today.  

## 2023-02-26 NOTE — Telephone Encounter (Signed)
Losartan should not effect her sleeping habits.  Given her comorbid conditions losartan has multiple benefits.  Zackery Brine [place], DO, [facility]

## 2023-02-26 NOTE — Telephone Encounter (Signed)
Charisse from [facility] called to inform us that patient bp before PT was 170/80 and after PT bp was 140/80. Charisse mention that patient stated that she does not like to take her Losartan due that it make her feel odd and not able to sleep. Patient did not take her Losartan today.

## 2023-02-27 NOTE — Telephone Encounter (Signed)
No, I would recommend they continue the current dose of losartan or reduce it to 25mg  po qday given her co-morbid conditions. What is feeling is not a true allergy.   Jill Munoz [place], DO, [facility]

## 2023-02-27 NOTE — Telephone Encounter (Signed)
Called patient and husband answered and inform me that patient bp last night was 110/68 and patient husband mention that you told them to only take the bp when the bp is high.

## 2023-02-28 DIAGNOSIS — E1159 Type 2 diabetes mellitus with other circulatory complications: Secondary | ICD-10-CM | POA: Diagnosis not present

## 2023-02-28 DIAGNOSIS — Z89612 Acquired absence of left leg above knee: Secondary | ICD-10-CM | POA: Diagnosis not present

## 2023-02-28 DIAGNOSIS — Z4781 Encounter for orthopedic aftercare following surgical amputation: Secondary | ICD-10-CM | POA: Diagnosis not present

## 2023-02-28 DIAGNOSIS — I10 Essential (primary) hypertension: Secondary | ICD-10-CM | POA: Diagnosis not present

## 2023-02-28 DIAGNOSIS — Z7984 Long term (current) use of oral hypoglycemic drugs: Secondary | ICD-10-CM | POA: Diagnosis not present

## 2023-02-28 DIAGNOSIS — D638 Anemia in other chronic diseases classified elsewhere: Secondary | ICD-10-CM | POA: Diagnosis not present

## 2023-02-28 DIAGNOSIS — E1165 Type 2 diabetes mellitus with hyperglycemia: Secondary | ICD-10-CM | POA: Diagnosis not present

## 2023-03-01 DIAGNOSIS — E113513 Type 2 diabetes mellitus with proliferative diabetic retinopathy with macular edema, bilateral: Secondary | ICD-10-CM | POA: Diagnosis not present

## 2023-03-01 DIAGNOSIS — Z794 Long term (current) use of insulin: Secondary | ICD-10-CM | POA: Diagnosis not present

## 2023-03-04 DIAGNOSIS — Z7984 Long term (current) use of oral hypoglycemic drugs: Secondary | ICD-10-CM | POA: Diagnosis not present

## 2023-03-04 DIAGNOSIS — Z4781 Encounter for orthopedic aftercare following surgical amputation: Secondary | ICD-10-CM | POA: Diagnosis not present

## 2023-03-04 DIAGNOSIS — I10 Essential (primary) hypertension: Secondary | ICD-10-CM | POA: Diagnosis not present

## 2023-03-04 DIAGNOSIS — E1159 Type 2 diabetes mellitus with other circulatory complications: Secondary | ICD-10-CM | POA: Diagnosis not present

## 2023-03-04 DIAGNOSIS — D638 Anemia in other chronic diseases classified elsewhere: Secondary | ICD-10-CM | POA: Diagnosis not present

## 2023-03-04 DIAGNOSIS — E1165 Type 2 diabetes mellitus with hyperglycemia: Secondary | ICD-10-CM | POA: Diagnosis not present

## 2023-03-04 DIAGNOSIS — Z89612 Acquired absence of left leg above knee: Secondary | ICD-10-CM | POA: Diagnosis not present

## 2023-03-05 DIAGNOSIS — E1159 Type 2 diabetes mellitus with other circulatory complications: Secondary | ICD-10-CM | POA: Diagnosis not present

## 2023-03-05 DIAGNOSIS — E1165 Type 2 diabetes mellitus with hyperglycemia: Secondary | ICD-10-CM | POA: Diagnosis not present

## 2023-03-05 DIAGNOSIS — I10 Essential (primary) hypertension: Secondary | ICD-10-CM | POA: Diagnosis not present

## 2023-03-05 DIAGNOSIS — Z7984 Long term (current) use of oral hypoglycemic drugs: Secondary | ICD-10-CM | POA: Diagnosis not present

## 2023-03-05 DIAGNOSIS — Z4781 Encounter for orthopedic aftercare following surgical amputation: Secondary | ICD-10-CM | POA: Diagnosis not present

## 2023-03-05 DIAGNOSIS — D638 Anemia in other chronic diseases classified elsewhere: Secondary | ICD-10-CM | POA: Diagnosis not present

## 2023-03-05 DIAGNOSIS — Z89612 Acquired absence of left leg above knee: Secondary | ICD-10-CM | POA: Diagnosis not present

## 2023-03-05 NOTE — Telephone Encounter (Signed)
Patient informed.  Verbalized understanding.

## 2023-03-06 DIAGNOSIS — R262 Difficulty in walking, not elsewhere classified: Secondary | ICD-10-CM | POA: Diagnosis not present

## 2023-03-06 DIAGNOSIS — I509 Heart failure, unspecified: Secondary | ICD-10-CM | POA: Diagnosis not present

## 2023-03-06 DIAGNOSIS — M726 Necrotizing fasciitis: Secondary | ICD-10-CM | POA: Diagnosis not present

## 2023-03-06 DIAGNOSIS — M6281 Muscle weakness (generalized): Secondary | ICD-10-CM | POA: Diagnosis not present

## 2023-03-06 DIAGNOSIS — J952 Acute pulmonary insufficiency following nonthoracic surgery: Secondary | ICD-10-CM | POA: Diagnosis not present

## 2023-03-06 DIAGNOSIS — R1312 Dysphagia, oropharyngeal phase: Secondary | ICD-10-CM | POA: Diagnosis not present

## 2023-03-06 DIAGNOSIS — M6259 Muscle wasting and atrophy, not elsewhere classified, multiple sites: Secondary | ICD-10-CM | POA: Diagnosis not present

## 2023-03-07 DIAGNOSIS — E1165 Type 2 diabetes mellitus with hyperglycemia: Secondary | ICD-10-CM | POA: Diagnosis not present

## 2023-03-07 DIAGNOSIS — Z4781 Encounter for orthopedic aftercare following surgical amputation: Secondary | ICD-10-CM | POA: Diagnosis not present

## 2023-03-07 DIAGNOSIS — E1159 Type 2 diabetes mellitus with other circulatory complications: Secondary | ICD-10-CM | POA: Diagnosis not present

## 2023-03-07 DIAGNOSIS — Z7984 Long term (current) use of oral hypoglycemic drugs: Secondary | ICD-10-CM | POA: Diagnosis not present

## 2023-03-07 DIAGNOSIS — Z89612 Acquired absence of left leg above knee: Secondary | ICD-10-CM | POA: Diagnosis not present

## 2023-03-07 DIAGNOSIS — D638 Anemia in other chronic diseases classified elsewhere: Secondary | ICD-10-CM | POA: Diagnosis not present

## 2023-03-07 DIAGNOSIS — I10 Essential (primary) hypertension: Secondary | ICD-10-CM | POA: Diagnosis not present

## 2023-03-11 DIAGNOSIS — Z7984 Long term (current) use of oral hypoglycemic drugs: Secondary | ICD-10-CM | POA: Diagnosis not present

## 2023-03-11 DIAGNOSIS — E1165 Type 2 diabetes mellitus with hyperglycemia: Secondary | ICD-10-CM | POA: Diagnosis not present

## 2023-03-11 DIAGNOSIS — Z89612 Acquired absence of left leg above knee: Secondary | ICD-10-CM | POA: Diagnosis not present

## 2023-03-11 DIAGNOSIS — D638 Anemia in other chronic diseases classified elsewhere: Secondary | ICD-10-CM | POA: Diagnosis not present

## 2023-03-11 DIAGNOSIS — I10 Essential (primary) hypertension: Secondary | ICD-10-CM | POA: Diagnosis not present

## 2023-03-11 DIAGNOSIS — Z4781 Encounter for orthopedic aftercare following surgical amputation: Secondary | ICD-10-CM | POA: Diagnosis not present

## 2023-03-11 DIAGNOSIS — E1159 Type 2 diabetes mellitus with other circulatory complications: Secondary | ICD-10-CM | POA: Diagnosis not present

## 2023-03-12 DIAGNOSIS — E1159 Type 2 diabetes mellitus with other circulatory complications: Secondary | ICD-10-CM | POA: Diagnosis not present

## 2023-03-12 DIAGNOSIS — Z4781 Encounter for orthopedic aftercare following surgical amputation: Secondary | ICD-10-CM | POA: Diagnosis not present

## 2023-03-12 DIAGNOSIS — Z89612 Acquired absence of left leg above knee: Secondary | ICD-10-CM | POA: Diagnosis not present

## 2023-03-12 DIAGNOSIS — E1165 Type 2 diabetes mellitus with hyperglycemia: Secondary | ICD-10-CM | POA: Diagnosis not present

## 2023-03-12 DIAGNOSIS — Z7984 Long term (current) use of oral hypoglycemic drugs: Secondary | ICD-10-CM | POA: Diagnosis not present

## 2023-03-12 DIAGNOSIS — I10 Essential (primary) hypertension: Secondary | ICD-10-CM | POA: Diagnosis not present

## 2023-03-12 DIAGNOSIS — D638 Anemia in other chronic diseases classified elsewhere: Secondary | ICD-10-CM | POA: Diagnosis not present

## 2023-03-14 ENCOUNTER — Telehealth: Payer: Self-pay | Admitting: Orthopedic Surgery

## 2023-03-14 NOTE — Telephone Encounter (Signed)
Patient asking for an rx for a wheelchair permanent use . Please advise

## 2023-03-15 DIAGNOSIS — Z4781 Encounter for orthopedic aftercare following surgical amputation: Secondary | ICD-10-CM | POA: Diagnosis not present

## 2023-03-15 DIAGNOSIS — I10 Essential (primary) hypertension: Secondary | ICD-10-CM | POA: Diagnosis not present

## 2023-03-15 DIAGNOSIS — E1159 Type 2 diabetes mellitus with other circulatory complications: Secondary | ICD-10-CM | POA: Diagnosis not present

## 2023-03-15 DIAGNOSIS — Z89612 Acquired absence of left leg above knee: Secondary | ICD-10-CM | POA: Diagnosis not present

## 2023-03-15 DIAGNOSIS — D638 Anemia in other chronic diseases classified elsewhere: Secondary | ICD-10-CM | POA: Diagnosis not present

## 2023-03-15 DIAGNOSIS — Z7984 Long term (current) use of oral hypoglycemic drugs: Secondary | ICD-10-CM | POA: Diagnosis not present

## 2023-03-15 DIAGNOSIS — E1165 Type 2 diabetes mellitus with hyperglycemia: Secondary | ICD-10-CM | POA: Diagnosis not present

## 2023-03-18 ENCOUNTER — Other Ambulatory Visit: Payer: Medicaid Other

## 2023-03-18 DIAGNOSIS — Z89612 Acquired absence of left leg above knee: Secondary | ICD-10-CM | POA: Diagnosis not present

## 2023-03-18 DIAGNOSIS — D638 Anemia in other chronic diseases classified elsewhere: Secondary | ICD-10-CM | POA: Diagnosis not present

## 2023-03-18 DIAGNOSIS — E1159 Type 2 diabetes mellitus with other circulatory complications: Secondary | ICD-10-CM | POA: Diagnosis not present

## 2023-03-18 DIAGNOSIS — Z7984 Long term (current) use of oral hypoglycemic drugs: Secondary | ICD-10-CM | POA: Diagnosis not present

## 2023-03-18 DIAGNOSIS — I10 Essential (primary) hypertension: Secondary | ICD-10-CM | POA: Diagnosis not present

## 2023-03-18 DIAGNOSIS — Z4781 Encounter for orthopedic aftercare following surgical amputation: Secondary | ICD-10-CM | POA: Diagnosis not present

## 2023-03-18 DIAGNOSIS — E1165 Type 2 diabetes mellitus with hyperglycemia: Secondary | ICD-10-CM | POA: Diagnosis not present

## 2023-03-18 NOTE — Telephone Encounter (Signed)
SW pt's son, let her know that I would have a paper Rx filled out and signed and he can pick up at office to take any DME company that they choose. They have been told several times that they cannot get a new one to purchase under insurance due to them already renting one that their insurance has been paying. They will not approve of another purchase. We had sent one through [employer] earlier this year and which was denied. He asked if I could place in the mail and I have done so. He is aware. I let him know this could take a week or two to get to them by mail.

## 2023-03-22 ENCOUNTER — Telehealth: Payer: Self-pay | Admitting: Family

## 2023-03-22 DIAGNOSIS — D638 Anemia in other chronic diseases classified elsewhere: Secondary | ICD-10-CM | POA: Diagnosis not present

## 2023-03-22 DIAGNOSIS — Z89612 Acquired absence of left leg above knee: Secondary | ICD-10-CM | POA: Diagnosis not present

## 2023-03-22 DIAGNOSIS — I10 Essential (primary) hypertension: Secondary | ICD-10-CM | POA: Diagnosis not present

## 2023-03-22 DIAGNOSIS — Z4781 Encounter for orthopedic aftercare following surgical amputation: Secondary | ICD-10-CM | POA: Diagnosis not present

## 2023-03-22 DIAGNOSIS — E1165 Type 2 diabetes mellitus with hyperglycemia: Secondary | ICD-10-CM | POA: Diagnosis not present

## 2023-03-22 DIAGNOSIS — E1159 Type 2 diabetes mellitus with other circulatory complications: Secondary | ICD-10-CM | POA: Diagnosis not present

## 2023-03-22 DIAGNOSIS — Z7984 Long term (current) use of oral hypoglycemic drugs: Secondary | ICD-10-CM | POA: Diagnosis not present

## 2023-03-22 NOTE — Telephone Encounter (Signed)
[facility] called in stating they would like a verbal request to move OT to the week of the 21st being therapist will not be in office cb number [phone] ask to speak with [facility]

## 2023-03-22 NOTE — Telephone Encounter (Signed)
Gave Christy VO approval

## 2023-03-25 ENCOUNTER — Ambulatory Visit: Payer: Medicaid Other | Admitting: Cardiology

## 2023-03-27 ENCOUNTER — Ambulatory Visit: Payer: Medicaid Other | Admitting: Hematology and Oncology

## 2023-03-27 ENCOUNTER — Other Ambulatory Visit: Payer: Medicaid Other

## 2023-03-28 ENCOUNTER — Other Ambulatory Visit: Payer: Medicaid Other

## 2023-03-28 ENCOUNTER — Ambulatory Visit: Payer: Medicaid Other | Admitting: Hematology and Oncology

## 2023-03-28 DIAGNOSIS — I10 Essential (primary) hypertension: Secondary | ICD-10-CM | POA: Diagnosis not present

## 2023-03-28 DIAGNOSIS — E1165 Type 2 diabetes mellitus with hyperglycemia: Secondary | ICD-10-CM | POA: Diagnosis not present

## 2023-03-28 DIAGNOSIS — Z4781 Encounter for orthopedic aftercare following surgical amputation: Secondary | ICD-10-CM | POA: Diagnosis not present

## 2023-03-28 DIAGNOSIS — Z89612 Acquired absence of left leg above knee: Secondary | ICD-10-CM | POA: Diagnosis not present

## 2023-03-28 DIAGNOSIS — E1159 Type 2 diabetes mellitus with other circulatory complications: Secondary | ICD-10-CM | POA: Diagnosis not present

## 2023-03-28 DIAGNOSIS — Z7984 Long term (current) use of oral hypoglycemic drugs: Secondary | ICD-10-CM | POA: Diagnosis not present

## 2023-03-28 DIAGNOSIS — D638 Anemia in other chronic diseases classified elsewhere: Secondary | ICD-10-CM | POA: Diagnosis not present

## 2023-03-29 ENCOUNTER — Other Ambulatory Visit: Payer: Medicaid Other

## 2023-03-29 DIAGNOSIS — H4053X4 Glaucoma secondary to other eye disorders, bilateral, indeterminate stage: Secondary | ICD-10-CM | POA: Diagnosis not present

## 2023-03-29 DIAGNOSIS — Z794 Long term (current) use of insulin: Secondary | ICD-10-CM | POA: Diagnosis not present

## 2023-03-29 DIAGNOSIS — E113513 Type 2 diabetes mellitus with proliferative diabetic retinopathy with macular edema, bilateral: Secondary | ICD-10-CM | POA: Diagnosis not present

## 2023-03-29 DIAGNOSIS — E1136 Type 2 diabetes mellitus with diabetic cataract: Secondary | ICD-10-CM | POA: Diagnosis not present

## 2023-04-05 ENCOUNTER — Telehealth: Payer: Self-pay | Admitting: Orthopedic Surgery

## 2023-04-05 NOTE — Telephone Encounter (Signed)
Called and sw Will verbal ok to orders below.

## 2023-04-05 NOTE — Telephone Encounter (Signed)
Will called he is the OT working with patient. He says he needs verbal orders for discharge for next week his call back number is [phone]

## 2023-04-06 DIAGNOSIS — R1312 Dysphagia, oropharyngeal phase: Secondary | ICD-10-CM | POA: Diagnosis not present

## 2023-04-06 DIAGNOSIS — M6259 Muscle wasting and atrophy, not elsewhere classified, multiple sites: Secondary | ICD-10-CM | POA: Diagnosis not present

## 2023-04-06 DIAGNOSIS — J952 Acute pulmonary insufficiency following nonthoracic surgery: Secondary | ICD-10-CM | POA: Diagnosis not present

## 2023-04-06 DIAGNOSIS — M726 Necrotizing fasciitis: Secondary | ICD-10-CM | POA: Diagnosis not present

## 2023-04-06 DIAGNOSIS — R262 Difficulty in walking, not elsewhere classified: Secondary | ICD-10-CM | POA: Diagnosis not present

## 2023-04-06 DIAGNOSIS — M6281 Muscle weakness (generalized): Secondary | ICD-10-CM | POA: Diagnosis not present

## 2023-04-06 DIAGNOSIS — I509 Heart failure, unspecified: Secondary | ICD-10-CM | POA: Diagnosis not present

## 2023-04-08 ENCOUNTER — Telehealth: Payer: Self-pay | Admitting: Orthopedic Surgery

## 2023-04-08 DIAGNOSIS — Z89612 Acquired absence of left leg above knee: Secondary | ICD-10-CM | POA: Diagnosis not present

## 2023-04-08 DIAGNOSIS — E1159 Type 2 diabetes mellitus with other circulatory complications: Secondary | ICD-10-CM | POA: Diagnosis not present

## 2023-04-08 DIAGNOSIS — Z4781 Encounter for orthopedic aftercare following surgical amputation: Secondary | ICD-10-CM | POA: Diagnosis not present

## 2023-04-08 DIAGNOSIS — D638 Anemia in other chronic diseases classified elsewhere: Secondary | ICD-10-CM | POA: Diagnosis not present

## 2023-04-08 DIAGNOSIS — E1165 Type 2 diabetes mellitus with hyperglycemia: Secondary | ICD-10-CM | POA: Diagnosis not present

## 2023-04-08 DIAGNOSIS — I10 Essential (primary) hypertension: Secondary | ICD-10-CM | POA: Diagnosis not present

## 2023-04-08 DIAGNOSIS — Z7984 Long term (current) use of oral hypoglycemic drugs: Secondary | ICD-10-CM | POA: Diagnosis not present

## 2023-04-08 NOTE — Telephone Encounter (Signed)
Charris from [facility] called. Woulld like verbal orders for OT 1wk 1x, 2wk 2, 1wk 1x. Call back number is [phone]

## 2023-04-08 NOTE — Telephone Encounter (Signed)
SW [place]. Gave her VO for PT ( it is not for OT). She also asked if pt needed to schedule an appointment. Was told by scheduler that she hasn't been seen for a year. I let Charrise know that if pt is still doing well she can come in as an as needed basis. If she has any issues with amputation we can see her in office for eval.

## 2023-04-11 ENCOUNTER — Inpatient Hospital Stay: Payer: Medicare Other | Attending: Hematology and Oncology

## 2023-04-11 ENCOUNTER — Inpatient Hospital Stay: Payer: Medicare Other | Admitting: Hematology and Oncology

## 2023-04-15 ENCOUNTER — Ambulatory Visit: Payer: Medicaid Other

## 2023-04-15 DIAGNOSIS — I10 Essential (primary) hypertension: Secondary | ICD-10-CM | POA: Diagnosis not present

## 2023-04-15 DIAGNOSIS — I509 Heart failure, unspecified: Secondary | ICD-10-CM

## 2023-04-19 DIAGNOSIS — I10 Essential (primary) hypertension: Secondary | ICD-10-CM | POA: Diagnosis not present

## 2023-04-19 DIAGNOSIS — Z4781 Encounter for orthopedic aftercare following surgical amputation: Secondary | ICD-10-CM | POA: Diagnosis not present

## 2023-04-19 DIAGNOSIS — E1165 Type 2 diabetes mellitus with hyperglycemia: Secondary | ICD-10-CM | POA: Diagnosis not present

## 2023-04-19 DIAGNOSIS — E1159 Type 2 diabetes mellitus with other circulatory complications: Secondary | ICD-10-CM | POA: Diagnosis not present

## 2023-04-19 DIAGNOSIS — Z89612 Acquired absence of left leg above knee: Secondary | ICD-10-CM | POA: Diagnosis not present

## 2023-04-19 DIAGNOSIS — Z7984 Long term (current) use of oral hypoglycemic drugs: Secondary | ICD-10-CM | POA: Diagnosis not present

## 2023-04-19 DIAGNOSIS — D638 Anemia in other chronic diseases classified elsewhere: Secondary | ICD-10-CM | POA: Diagnosis not present

## 2023-04-22 ENCOUNTER — Telehealth: Payer: Self-pay | Admitting: Orthopedic Surgery

## 2023-04-22 DIAGNOSIS — Z4781 Encounter for orthopedic aftercare following surgical amputation: Secondary | ICD-10-CM | POA: Diagnosis not present

## 2023-04-22 DIAGNOSIS — D638 Anemia in other chronic diseases classified elsewhere: Secondary | ICD-10-CM | POA: Diagnosis not present

## 2023-04-22 DIAGNOSIS — E1159 Type 2 diabetes mellitus with other circulatory complications: Secondary | ICD-10-CM | POA: Diagnosis not present

## 2023-04-22 DIAGNOSIS — Z89612 Acquired absence of left leg above knee: Secondary | ICD-10-CM | POA: Diagnosis not present

## 2023-04-22 DIAGNOSIS — E1165 Type 2 diabetes mellitus with hyperglycemia: Secondary | ICD-10-CM | POA: Diagnosis not present

## 2023-04-22 DIAGNOSIS — Z7984 Long term (current) use of oral hypoglycemic drugs: Secondary | ICD-10-CM | POA: Diagnosis not present

## 2023-04-22 DIAGNOSIS — I10 Essential (primary) hypertension: Secondary | ICD-10-CM | POA: Diagnosis not present

## 2023-04-22 NOTE — Telephone Encounter (Signed)
SW Jill Munoz, given his VO approval.

## 2023-04-22 NOTE — Telephone Encounter (Signed)
Wil with [facility] called. He is the OT working with patient. He would like verbal orders 1wk 1, 2wk 1, 1wk 1. His cb# [phone]

## 2023-04-23 ENCOUNTER — Ambulatory Visit: Payer: Medicaid Other | Admitting: Cardiology

## 2023-04-23 DIAGNOSIS — Z7984 Long term (current) use of oral hypoglycemic drugs: Secondary | ICD-10-CM | POA: Diagnosis not present

## 2023-04-23 DIAGNOSIS — Z4781 Encounter for orthopedic aftercare following surgical amputation: Secondary | ICD-10-CM | POA: Diagnosis not present

## 2023-04-23 DIAGNOSIS — D638 Anemia in other chronic diseases classified elsewhere: Secondary | ICD-10-CM | POA: Diagnosis not present

## 2023-04-23 DIAGNOSIS — I10 Essential (primary) hypertension: Secondary | ICD-10-CM | POA: Diagnosis not present

## 2023-04-23 DIAGNOSIS — E1165 Type 2 diabetes mellitus with hyperglycemia: Secondary | ICD-10-CM | POA: Diagnosis not present

## 2023-04-23 DIAGNOSIS — E1159 Type 2 diabetes mellitus with other circulatory complications: Secondary | ICD-10-CM | POA: Diagnosis not present

## 2023-04-23 DIAGNOSIS — Z89612 Acquired absence of left leg above knee: Secondary | ICD-10-CM | POA: Diagnosis not present

## 2023-04-24 NOTE — Progress Notes (Signed)
Called patient he is aware of results and next appointment

## 2023-04-25 DIAGNOSIS — I10 Essential (primary) hypertension: Secondary | ICD-10-CM | POA: Diagnosis not present

## 2023-04-25 DIAGNOSIS — Z7984 Long term (current) use of oral hypoglycemic drugs: Secondary | ICD-10-CM | POA: Diagnosis not present

## 2023-04-25 DIAGNOSIS — E1159 Type 2 diabetes mellitus with other circulatory complications: Secondary | ICD-10-CM | POA: Diagnosis not present

## 2023-04-25 DIAGNOSIS — Z89612 Acquired absence of left leg above knee: Secondary | ICD-10-CM | POA: Diagnosis not present

## 2023-04-25 DIAGNOSIS — D638 Anemia in other chronic diseases classified elsewhere: Secondary | ICD-10-CM | POA: Diagnosis not present

## 2023-04-25 DIAGNOSIS — Z4781 Encounter for orthopedic aftercare following surgical amputation: Secondary | ICD-10-CM | POA: Diagnosis not present

## 2023-04-25 DIAGNOSIS — E1165 Type 2 diabetes mellitus with hyperglycemia: Secondary | ICD-10-CM | POA: Diagnosis not present

## 2023-04-26 DIAGNOSIS — I10 Essential (primary) hypertension: Secondary | ICD-10-CM | POA: Diagnosis not present

## 2023-04-26 DIAGNOSIS — K5900 Constipation, unspecified: Secondary | ICD-10-CM | POA: Diagnosis not present

## 2023-04-26 DIAGNOSIS — E1165 Type 2 diabetes mellitus with hyperglycemia: Secondary | ICD-10-CM | POA: Diagnosis not present

## 2023-04-26 DIAGNOSIS — Z1389 Encounter for screening for other disorder: Secondary | ICD-10-CM | POA: Diagnosis not present

## 2023-04-26 DIAGNOSIS — J309 Allergic rhinitis, unspecified: Secondary | ICD-10-CM | POA: Diagnosis not present

## 2023-04-26 DIAGNOSIS — Z0000 Encounter for general adult medical examination without abnormal findings: Secondary | ICD-10-CM | POA: Diagnosis not present

## 2023-04-26 DIAGNOSIS — H409 Unspecified glaucoma: Secondary | ICD-10-CM | POA: Diagnosis not present

## 2023-04-26 DIAGNOSIS — Z1211 Encounter for screening for malignant neoplasm of colon: Secondary | ICD-10-CM | POA: Diagnosis not present

## 2023-04-26 DIAGNOSIS — I700 Atherosclerosis of aorta: Secondary | ICD-10-CM | POA: Diagnosis not present

## 2023-04-26 DIAGNOSIS — D649 Anemia, unspecified: Secondary | ICD-10-CM | POA: Diagnosis not present

## 2023-04-26 DIAGNOSIS — Z89612 Acquired absence of left leg above knee: Secondary | ICD-10-CM | POA: Diagnosis not present

## 2023-04-30 ENCOUNTER — Ambulatory Visit: Payer: Medicaid Other | Admitting: Cardiology

## 2023-04-30 DIAGNOSIS — H4053X4 Glaucoma secondary to other eye disorders, bilateral, indeterminate stage: Secondary | ICD-10-CM | POA: Diagnosis not present

## 2023-05-02 DIAGNOSIS — E1165 Type 2 diabetes mellitus with hyperglycemia: Secondary | ICD-10-CM | POA: Diagnosis not present

## 2023-05-02 DIAGNOSIS — D638 Anemia in other chronic diseases classified elsewhere: Secondary | ICD-10-CM | POA: Diagnosis not present

## 2023-05-02 DIAGNOSIS — E1159 Type 2 diabetes mellitus with other circulatory complications: Secondary | ICD-10-CM | POA: Diagnosis not present

## 2023-05-02 DIAGNOSIS — Z4781 Encounter for orthopedic aftercare following surgical amputation: Secondary | ICD-10-CM | POA: Diagnosis not present

## 2023-05-02 DIAGNOSIS — Z7984 Long term (current) use of oral hypoglycemic drugs: Secondary | ICD-10-CM | POA: Diagnosis not present

## 2023-05-02 DIAGNOSIS — I10 Essential (primary) hypertension: Secondary | ICD-10-CM | POA: Diagnosis not present

## 2023-05-02 DIAGNOSIS — Z89612 Acquired absence of left leg above knee: Secondary | ICD-10-CM | POA: Diagnosis not present

## 2023-05-06 ENCOUNTER — Telehealth: Payer: Self-pay | Admitting: Orthopedic Surgery

## 2023-05-06 DIAGNOSIS — E1159 Type 2 diabetes mellitus with other circulatory complications: Secondary | ICD-10-CM | POA: Diagnosis not present

## 2023-05-06 DIAGNOSIS — D638 Anemia in other chronic diseases classified elsewhere: Secondary | ICD-10-CM | POA: Diagnosis not present

## 2023-05-06 DIAGNOSIS — E1165 Type 2 diabetes mellitus with hyperglycemia: Secondary | ICD-10-CM | POA: Diagnosis not present

## 2023-05-06 DIAGNOSIS — M726 Necrotizing fasciitis: Secondary | ICD-10-CM | POA: Diagnosis not present

## 2023-05-06 DIAGNOSIS — M6281 Muscle weakness (generalized): Secondary | ICD-10-CM | POA: Diagnosis not present

## 2023-05-06 DIAGNOSIS — I10 Essential (primary) hypertension: Secondary | ICD-10-CM | POA: Diagnosis not present

## 2023-05-06 DIAGNOSIS — R262 Difficulty in walking, not elsewhere classified: Secondary | ICD-10-CM | POA: Diagnosis not present

## 2023-05-06 DIAGNOSIS — Z89612 Acquired absence of left leg above knee: Secondary | ICD-10-CM | POA: Diagnosis not present

## 2023-05-06 DIAGNOSIS — M6259 Muscle wasting and atrophy, not elsewhere classified, multiple sites: Secondary | ICD-10-CM | POA: Diagnosis not present

## 2023-05-06 DIAGNOSIS — Z4781 Encounter for orthopedic aftercare following surgical amputation: Secondary | ICD-10-CM | POA: Diagnosis not present

## 2023-05-06 DIAGNOSIS — R1312 Dysphagia, oropharyngeal phase: Secondary | ICD-10-CM | POA: Diagnosis not present

## 2023-05-06 DIAGNOSIS — J952 Acute pulmonary insufficiency following nonthoracic surgery: Secondary | ICD-10-CM | POA: Diagnosis not present

## 2023-05-06 DIAGNOSIS — Z7984 Long term (current) use of oral hypoglycemic drugs: Secondary | ICD-10-CM | POA: Diagnosis not present

## 2023-05-06 DIAGNOSIS — I509 Heart failure, unspecified: Secondary | ICD-10-CM | POA: Diagnosis not present

## 2023-05-06 NOTE — Telephone Encounter (Signed)
Besty from [facility] called. Would like PT orders 2wk 4, 1wk 1x. Cb# [phone]

## 2023-05-06 NOTE — Telephone Encounter (Signed)
Called and advised verbal ok for orders as requested below.

## 2023-05-07 ENCOUNTER — Telehealth: Payer: Self-pay | Admitting: Orthopedic Surgery

## 2023-05-07 DIAGNOSIS — Z89612 Acquired absence of left leg above knee: Secondary | ICD-10-CM | POA: Diagnosis not present

## 2023-05-07 DIAGNOSIS — E1165 Type 2 diabetes mellitus with hyperglycemia: Secondary | ICD-10-CM | POA: Diagnosis not present

## 2023-05-07 DIAGNOSIS — Z4781 Encounter for orthopedic aftercare following surgical amputation: Secondary | ICD-10-CM | POA: Diagnosis not present

## 2023-05-07 DIAGNOSIS — Z7984 Long term (current) use of oral hypoglycemic drugs: Secondary | ICD-10-CM | POA: Diagnosis not present

## 2023-05-07 DIAGNOSIS — E1159 Type 2 diabetes mellitus with other circulatory complications: Secondary | ICD-10-CM | POA: Diagnosis not present

## 2023-05-07 DIAGNOSIS — D638 Anemia in other chronic diseases classified elsewhere: Secondary | ICD-10-CM | POA: Diagnosis not present

## 2023-05-07 DIAGNOSIS — I10 Essential (primary) hypertension: Secondary | ICD-10-CM | POA: Diagnosis not present

## 2023-05-07 NOTE — Telephone Encounter (Signed)
Received call from Will with [facility] needing verbal orders to extend HHOT 1 Wk 3. The number to contact Will is [phone]

## 2023-05-07 NOTE — Telephone Encounter (Signed)
Called and sw Will advised verbal ok for the orders as requested below.

## 2023-05-09 DIAGNOSIS — D638 Anemia in other chronic diseases classified elsewhere: Secondary | ICD-10-CM | POA: Diagnosis not present

## 2023-05-09 DIAGNOSIS — Z7984 Long term (current) use of oral hypoglycemic drugs: Secondary | ICD-10-CM | POA: Diagnosis not present

## 2023-05-09 DIAGNOSIS — Z89612 Acquired absence of left leg above knee: Secondary | ICD-10-CM | POA: Diagnosis not present

## 2023-05-09 DIAGNOSIS — E1165 Type 2 diabetes mellitus with hyperglycemia: Secondary | ICD-10-CM | POA: Diagnosis not present

## 2023-05-09 DIAGNOSIS — I10 Essential (primary) hypertension: Secondary | ICD-10-CM | POA: Diagnosis not present

## 2023-05-09 DIAGNOSIS — E1159 Type 2 diabetes mellitus with other circulatory complications: Secondary | ICD-10-CM | POA: Diagnosis not present

## 2023-05-09 DIAGNOSIS — Z4781 Encounter for orthopedic aftercare following surgical amputation: Secondary | ICD-10-CM | POA: Diagnosis not present

## 2023-05-13 ENCOUNTER — Ambulatory Visit: Payer: Medicaid Other | Admitting: Cardiology

## 2023-05-13 DIAGNOSIS — I10 Essential (primary) hypertension: Secondary | ICD-10-CM | POA: Diagnosis not present

## 2023-05-13 DIAGNOSIS — Z89612 Acquired absence of left leg above knee: Secondary | ICD-10-CM | POA: Diagnosis not present

## 2023-05-13 DIAGNOSIS — E1165 Type 2 diabetes mellitus with hyperglycemia: Secondary | ICD-10-CM | POA: Diagnosis not present

## 2023-05-13 DIAGNOSIS — E1159 Type 2 diabetes mellitus with other circulatory complications: Secondary | ICD-10-CM | POA: Diagnosis not present

## 2023-05-13 DIAGNOSIS — Z7984 Long term (current) use of oral hypoglycemic drugs: Secondary | ICD-10-CM | POA: Diagnosis not present

## 2023-05-13 DIAGNOSIS — Z4781 Encounter for orthopedic aftercare following surgical amputation: Secondary | ICD-10-CM | POA: Diagnosis not present

## 2023-05-13 DIAGNOSIS — D638 Anemia in other chronic diseases classified elsewhere: Secondary | ICD-10-CM | POA: Diagnosis not present

## 2023-05-14 ENCOUNTER — Ambulatory Visit: Payer: Medicaid Other | Admitting: Cardiology

## 2023-05-14 ENCOUNTER — Encounter: Payer: Self-pay | Admitting: Cardiology

## 2023-05-14 VITALS — BP 178/72 | HR 82 | Resp 16 | Ht 61.0 in | Wt 102.0 lb

## 2023-05-14 DIAGNOSIS — D638 Anemia in other chronic diseases classified elsewhere: Secondary | ICD-10-CM

## 2023-05-14 DIAGNOSIS — I5032 Chronic diastolic (congestive) heart failure: Secondary | ICD-10-CM

## 2023-05-14 DIAGNOSIS — Z89612 Acquired absence of left leg above knee: Secondary | ICD-10-CM | POA: Diagnosis not present

## 2023-05-14 DIAGNOSIS — E119 Type 2 diabetes mellitus without complications: Secondary | ICD-10-CM | POA: Diagnosis not present

## 2023-05-14 DIAGNOSIS — I10 Essential (primary) hypertension: Secondary | ICD-10-CM

## 2023-05-14 MED ORDER — ROSUVASTATIN CALCIUM 20 MG PO TABS
20.0000 mg | ORAL_TABLET | Freq: Every day | ORAL | 3 refills | Status: DC
Start: 2023-05-14 — End: 2024-03-02

## 2023-05-14 MED ORDER — SPIRONOLACTONE 25 MG PO TABS
25.0000 mg | ORAL_TABLET | Freq: Every day | ORAL | 3 refills | Status: DC
Start: 2023-05-14 — End: 2024-03-02

## 2023-05-14 NOTE — Progress Notes (Signed)
ID:  Jill Munoz, DOB 01/19/1951, MRN [id number]  PCP:  Karie Georges, MD  Cardiologist:  Tessa Lerner, DO, [facility] (established care 01/31/23)  Date: 05/14/23 Last Office Visit: 01/31/2023  Chief Complaint  Patient presents with   Congestive heart failure [facility])   Follow-up    HPI  Jill Munoz is a 72 y.o. [place] descent female whose past medical history and cardiovascular risk factors include:  Aortic atherosclerosis, GERD, glaucoma, Normocytic normochromic anemia, iron deficiency, non-insulin-dependent diabetes mellitus type 2, history of necrotizing fasciitis status post left AKA.  Patient is accompanied by her husband and cousin at today's office visit.  Patient speaks English minimally but is able to answer a few questions.  Husband and cousin provide translation services during today's encounter.  Patient was referred to the practice for evaluation of heart failure management.  She underwent left AKA surgery in 2023 and since then has not been back to baseline.  Symptoms suggestive of congestive heart failure and prior echocardiogram in June 2023 notes LVEF of 40-45%, grade 1 diastolic impairment.  She was started on torsemide and losartan at the last office visit.  Due to the development of AKI patient was recommended to stop torsemide but to continue Farxiga in the morning and losartan at night.  Follow-up labs have noted a significant improvement in renal function back to baseline. Given her diabetes mellitus she was also recommended to be on statin therapy but refused.   Patient presents today for 51-month follow-up visit.  Denies anginal chest pain.  She still prefers to sleep at a 45 degree incline due to back pain from kyphoscoliosis of the thoracic spine.  If she were to lay flat she is more tolerable compared to past.  Swelling in the right lower extremity has improved.  Last hemoglobin A1c according to the family is 9 (do not have records available for review).  They  did keep a log of her blood pressures in July on visual estimation morning blood pressures are between 130-140 mmHg and evening blood pressures are between 120-150 mmHg.   FUNCTIONAL STATUS: Limited - in wheelchair she is s/p left AKA awaiting prosthetic leg.   ALLERGIES: No Known Allergies  MEDICATION LIST PRIOR TO VISIT: Current Meds  Medication Sig   Ascorbic Acid (VITAMIN C) 1000 MG tablet Take 1,000 mg by mouth daily.   Berberine Chloride 500 MG CAPS Take 1 capsule by mouth in the morning, at noon, and at bedtime.   brimonidine (ALPHAGAN) 0.2 % ophthalmic solution Place 1 drop into both eyes 3 (three) times daily.   dapagliflozin propanediol (FARXIGA) 10 MG TABS tablet TAKE 1 TABLET BY MOUTH DAILY BEFORE BREAKFAST.   dorzolamide-timolol (COSOPT) 22.3-6.8 MG/ML ophthalmic solution Place 1 drop into both eyes 2 (two) times daily.   glucose blood (ACCU-CHEK GUIDE) test strip Use as instructed once a day   melatonin 3 MG TABS tablet Take 3 mg by mouth at bedtime.   pantoprazole (PROTONIX) 40 MG tablet Take 1 tablet (40 mg total) by mouth daily.   rosuvastatin (CRESTOR) 20 MG tablet Take 1 tablet (20 mg total) by mouth at bedtime.   sennosides-docusate sodium (SENOKOT-S) 8.6-50 MG tablet Take 1 tablet by mouth daily as needed for constipation.   spironolactone (ALDACTONE) 25 MG tablet Take 1 tablet (25 mg total) by mouth daily.     PAST MEDICAL HISTORY: Past Medical History:  Diagnosis Date   Anemia    Diabetes mellitus without complication (HCC)    Pre-diabetes  PAST SURGICAL HISTORY: Past Surgical History:  Procedure Laterality Date   AMPUTATION  04/06/2022   Procedure: AMPUTATION ABOVE KNEE REVISION;  Surgeon: [name], MD;  Location: [facility] OR;  Service: Orthopedics;;   I & D EXTREMITY Left 04/02/2022   Procedure: IRRIGATION AND DEBRIDEMENT LEFT LOWER LEG APPLICATION OF WOUND VAC ;  Surgeon: Ernestina Columbia, MD;  Location: [facility] OR;  Service: Orthopedics;  Laterality: Left;    I & D EXTREMITY Left 04/04/2022   Procedure: LEFT ABOVE KNEE AMPUTATION;  Surgeon: [name], MD;  Location[facility] OR;  Service: Orthopedics;  Laterality: Left;   I & D EXTREMITY Left 04/06/2022   Procedure: LEFT LEG DEBRIDEMENT;  Surgeon: [name], MD;  Location: [facility] OR;  Service: Orthopedics;  Laterality: Left;   STUMP REVISION Left 04/11/2022   Procedure: REVISION LEFT ABOVE KNEE AMPUTATION;  Surgeon: [name], MD;  Location: [facility] OR;  Service: Orthopedics;  Laterality: Left;    FAMILY HISTORY: The patient family history includes Heart attack in her mother; Heart disease in her mother.  SOCIAL HISTORY:  The patient  reports that she has never smoked. She has never used smokeless tobacco. She reports that she does not drink alcohol and does not use drugs.  REVIEW OF SYSTEMS: Review of Systems  Constitutional: Positive for weight gain.  Cardiovascular:  Positive for leg swelling, orthopnea and paroxysmal nocturnal dyspnea. Negative for chest pain, claudication, dyspnea on exertion, irregular heartbeat, near-syncope, palpitations and syncope.  Respiratory:  Negative for shortness of breath.   Hematologic/Lymphatic: Negative for bleeding problem.  Musculoskeletal:  Negative for muscle cramps and myalgias.  Neurological:  Negative for dizziness and light-headedness.    PHYSICAL EXAM:    05/14/2023   11:16 AM 05/14/2023   11:07 AM 01/31/2023   10:56 AM  Vitals with BMI  Height  5\' 1"    Weight  102 lbs   BMI  19.28   Systolic 178 194 409  Diastolic 72 100 82  Pulse 82 85 98    Physical Exam  Constitutional: No distress.  Age appropriate, hemodynamically stable.   Neck: No JVD present.  Cardiovascular: Normal rate, regular rhythm, S1 normal, S2 normal, intact distal pulses and normal pulses. Exam reveals no gallop, no S3 and no S4.  No murmur heard. Pulmonary/Chest: Effort normal and breath sounds normal. No stridor. She has no wheezes. She has no rales.  Abdominal:  Soft. Bowel sounds are normal. She exhibits no distension. There is no abdominal tenderness.  Musculoskeletal:        General: No edema.     Cervical back: Neck supple.  Neurological: She is alert and oriented to person, place, and time. She has intact cranial nerves (2-12).  Skin: Skin is warm and moist.   CARDIAC DATABASE: EKG: January 31, 2023 sinus rhythm, 100 bpm, LAE, baseline artifact, without underlying injury pattern.  Echocardiogram: 04/03/2022:  1. The mitral valve is abnormally thickened. Though morphology favors calcification, cannot exclude small vegetation (study indication is  bacteremia). TEE may be considered. No evidence of mitral valve regurgitation.   2. Left ventricular ejection fraction, by estimation, is 40 to 45%. The left ventricle has mildly decreased function. The left ventricle  demonstrates global hypokinesis. There is severe asymmetric left  ventricular hypertrophy of the basal-septal  segment. Left ventricular diastolic parameters are consistent with Grade I  diastolic dysfunction (impaired relaxation).   3. Right ventricular systolic function is normal. The right ventricular  size is normal. Tricuspid regurgitation signal  is inadequate for assessing  PA pressure.   4. The aortic valve is tricuspid. Aortic valve regurgitation is not  visualized.    04/15/2023: Left ventricle cavity is normal in size. Normal left ventricular wall thickness. Normal global wall motion. Normal LV systolic function with EF 66%. Doppler evidence of grade I (impaired) diastolic dysfunction, normal LAP. Left atrial cavity is moderately dilated. No significant valvular abnormality. No evidence of pulmonary hypertension.  Stress Testing: No results found for this or any previous visit from the past 1095 days.   Heart Catheterization: None  LABORATORY DATA:    Latest Ref Rng & Units 01/10/2023   11:55 AM 01/10/2023   11:54 AM 04/26/2022    3:54 PM  CBC  WBC 4.0 - 10.5 K/uL   5.2  7.8   Hemoglobin 12.0 - 15.0 g/dL  8.5  8.4   Hematocrit 34.0 - 46.6 % 27.1  26.4  26.9   Platelets 150 - 400 K/uL  393  661        Latest Ref Rng & Units 02/15/2023    3:47 PM 02/07/2023    4:20 PM 01/31/2023   12:41 PM  CMP  Glucose 70 - 99 mg/dL 161  096  045   BUN 8 - 27 mg/dL 33  37  28   Creatinine 0.57 - 1.00 mg/dL 4.09  8.11  9.14   Sodium 134 - 144 mmol/L 138  137  138   Potassium 3.5 - 5.2 mmol/L 4.6  4.7  4.3   Chloride 96 - 106 mmol/L 101  97  101   CO2 20 - 29 mmol/L 21  23  21    Calcium 8.7 - 10.3 mg/dL 9.7  9.7  9.5     Lipid Panel     Component Value Date/Time   CHOL 87 04/02/2022 1556   TRIG 150 (H) 04/02/2022 1556   HDL 16 (L) 04/02/2022 1556   CHOLHDL 5.4 04/02/2022 1556   VLDL 30 04/02/2022 1556   LDLCALC 41 04/02/2022 1556    No components found for: "NTPROBNP" Recent Labs    01/31/23 1241 02/07/23 1625  PROBNP 300 168   No results for input(s): "TSH" in the last 8760 hours.   BMP Recent Labs    01/10/23 1154 01/31/23 1241 02/07/23 1620 02/15/23 1547  NA 136 138 137 138  K 4.3 4.3 4.7 4.6  CL 101 101 97 101  CO2 27 21 23 21   GLUCOSE 303* 246* 240* 250*  BUN 27* 28* 37* 33*  CREATININE 1.04* 0.91 1.49* 1.05*  CALCIUM 9.5 9.5 9.7 9.7  GFRNONAA 58*  --   --   --     HEMOGLOBIN A1C Lab Results  Component Value Date   HGBA1C 7.7 (A) 08/21/2022   MPG 269 04/02/2022   External Labs: Collected: 01/03/2023 Hemoglobin 8.3, hematocrit 25.9%. BUN 24, creatinine 0.87. Sodium 134, potassium 4.7, chloride 99, bicarb 27 AST 10, ALT 11, alkaline phosphatase 164 Hemoglobin A1c 9.7  IMPRESSION:    ICD-10-CM   1. Chronic heart failure with preserved ejection fraction (HCC)  I50.32 spironolactone (ALDACTONE) 25 MG tablet    PCV MYOCARDIAL PERFUSION WITH LEXISCAN    CMP14+EGFR    2. Benign hypertension  I10     3. Non-insulin dependent type 2 diabetes mellitus (HCC)  E11.9 rosuvastatin (CRESTOR) 20 MG tablet    Lipid Panel With  LDL/HDL Ratio    LDL cholesterol, direct    CMP14+EGFR    4. Anemia of chronic disease  D63.8     5. Hx of AKA (above knee amputation), left [facility])  A41.660         RECOMMENDATIONS: Jill Munoz is a 72 y.o. [place] descent female whose past medical history and cardiac risk factors include: Aortic atherosclerosis, GERD, glaucoma, Normocytic normochromic anemia, iron deficiency, non-insulin-dependent diabetes mellitus type 2, history of necrotizing fasciitis status post left AKA.  Chronic heart failure with preserved ejection fraction [facility]) June 2023: LVEF 40-45%, grade 1 diastolic impairment. July 2024: LVEF 66%, grade 1 diastolic impairment, see report for additional details. Patient's symptoms of heart failure have significantly improved since last office visit. Torsemide had to be discontinued due to AKI. Continue current medical therapy. Will start spironolactone 25 mg p.o. daily. Given the a recent episode of AKI they wanted to hold off on transitioning from ARB to [place] at this time. Strict I's and O's, daily weights. Right lower extremity compression stockings recommended. Home blood pressures are better controlled but not at goal. Shared decision was to proceed with ischemic workup given multiple cardiovascular risk factors including diabetes and HFpEF.  Benign hypertension Office blood pressures are not at goal. Home blood pressures are improving. Medications as noted above. Patient is aware that her goal SBP should be around 130 mmHg. May consider BiDil at the next office visit  Non-insulin dependent type 2 diabetes mellitus (HCC) Family states that her hemoglobin A1c is 9, I do not have these records available for review. Currently on metformin and Jardiance. Patient is agreeable with starting statin therapy. Will obtain baseline lipid profile. Start rosuvastatin 20 mg p.o. nightly. Will need follow-up lipids in 6 weeks to reevaluate therapy  Hx of AKA (above  knee amputation), left (HCC) Currently in physical therapy to use her orthotics.  Patient was accompanied by her husband and cousin at today's office visit.  They both helped with translation to make sure the patient understands the results of her recent testing, labs, disease progression with regards to management, and answer questions.    FINAL MEDICATION LIST END OF ENCOUNTER: Meds ordered this encounter  Medications   spironolactone (ALDACTONE) 25 MG tablet    Sig: Take 1 tablet (25 mg total) by mouth daily.    Dispense:  90 tablet    Refill:  3   rosuvastatin (CRESTOR) 20 MG tablet    Sig: Take 1 tablet (20 mg total) by mouth at bedtime.    Dispense:  90 tablet    Refill:  3    Medications Discontinued During This Encounter  Medication Reason   lidocaine (XYLOCAINE) 2 % solution      Current Outpatient Medications:    Ascorbic Acid (VITAMIN C) 1000 MG tablet, Take 1,000 mg by mouth daily., Disp: , Rfl:    Berberine Chloride 500 MG CAPS, Take 1 capsule by mouth in the morning, at noon, and at bedtime., Disp: , Rfl:    brimonidine (ALPHAGAN) 0.2 % ophthalmic solution, Place 1 drop into both eyes 3 (three) times daily., Disp: , Rfl:    dapagliflozin propanediol (FARXIGA) 10 MG TABS tablet, TAKE 1 TABLET BY MOUTH DAILY BEFORE BREAKFAST., Disp: 90 tablet, Rfl: 1   dorzolamide-timolol (COSOPT) 22.3-6.8 MG/ML ophthalmic solution, Place 1 drop into both eyes 2 (two) times daily., Disp: 10 mL, Rfl: 12   glucose blood (ACCU-CHEK GUIDE) test strip, Use as instructed once a day, Disp: 100 each, Rfl: 3   melatonin 3 MG TABS tablet, Take 3 mg by mouth at bedtime., Disp: , Rfl:  pantoprazole (PROTONIX) 40 MG tablet, Take 1 tablet (40 mg total) by mouth daily., Disp: 90 tablet, Rfl: 1   rosuvastatin (CRESTOR) 20 MG tablet, Take 1 tablet (20 mg total) by mouth at bedtime., Disp: 90 tablet, Rfl: 3   sennosides-docusate sodium (SENOKOT-S) 8.6-50 MG tablet, Take 1 tablet by mouth daily as needed  for constipation., Disp: 90 tablet, Rfl: 0   spironolactone (ALDACTONE) 25 MG tablet, Take 1 tablet (25 mg total) by mouth daily., Disp: 90 tablet, Rfl: 3   losartan (COZAAR) 50 MG tablet, TAKE 1 TABLET BY MOUTH DAILY AT 10PM, Disp: 90 tablet, Rfl: 1  Orders Placed This Encounter  Procedures   Lipid Panel With LDL/HDL Ratio   LDL cholesterol, direct   QIO96+EXBM   PCV MYOCARDIAL PERFUSION WITH LEXISCAN    There are no Patient Instructions on file for this visit.   --Continue cardiac medications as reconciled in final medication list. --Return in about 3 months (around 08/14/2023) for Follow up HFpEF. or sooner if needed. --Continue follow-up with your primary care physician regarding the management of your other chronic comorbid conditions.  Patient's questions and concerns were addressed to her satisfaction. She voices understanding of the instructions provided during this encounter.   This note was created using a voice recognition software as a result there may be grammatical errors inadvertently enclosed that do not reflect the nature of this encounter. Every attempt is made to correct such errors.  Tessa Lerner, [place], [facility]  Pager:  [phone] Office: [phone]

## 2023-05-16 ENCOUNTER — Telehealth: Payer: Self-pay | Admitting: Orthopedic Surgery

## 2023-05-16 DIAGNOSIS — Z89612 Acquired absence of left leg above knee: Secondary | ICD-10-CM | POA: Diagnosis not present

## 2023-05-16 DIAGNOSIS — I10 Essential (primary) hypertension: Secondary | ICD-10-CM | POA: Diagnosis not present

## 2023-05-16 DIAGNOSIS — Z4781 Encounter for orthopedic aftercare following surgical amputation: Secondary | ICD-10-CM | POA: Diagnosis not present

## 2023-05-16 DIAGNOSIS — E1165 Type 2 diabetes mellitus with hyperglycemia: Secondary | ICD-10-CM | POA: Diagnosis not present

## 2023-05-16 DIAGNOSIS — Z7984 Long term (current) use of oral hypoglycemic drugs: Secondary | ICD-10-CM | POA: Diagnosis not present

## 2023-05-16 DIAGNOSIS — D638 Anemia in other chronic diseases classified elsewhere: Secondary | ICD-10-CM | POA: Diagnosis not present

## 2023-05-16 DIAGNOSIS — E1159 Type 2 diabetes mellitus with other circulatory complications: Secondary | ICD-10-CM | POA: Diagnosis not present

## 2023-05-16 NOTE — Telephone Encounter (Signed)
Patient's spouse Gerri Lins called asked if Dr. 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  The number to contact Gerri Lins is [phone]  Gerri Lins also asked where can he get a wheelchair for the patient? Also, need the address to go pickup the wheelchair.

## 2023-05-17 DIAGNOSIS — I10 Essential (primary) hypertension: Secondary | ICD-10-CM | POA: Diagnosis not present

## 2023-05-17 DIAGNOSIS — Z4781 Encounter for orthopedic aftercare following surgical amputation: Secondary | ICD-10-CM | POA: Diagnosis not present

## 2023-05-17 DIAGNOSIS — E1159 Type 2 diabetes mellitus with other circulatory complications: Secondary | ICD-10-CM | POA: Diagnosis not present

## 2023-05-17 DIAGNOSIS — Z89612 Acquired absence of left leg above knee: Secondary | ICD-10-CM | POA: Diagnosis not present

## 2023-05-17 DIAGNOSIS — D638 Anemia in other chronic diseases classified elsewhere: Secondary | ICD-10-CM | POA: Diagnosis not present

## 2023-05-17 DIAGNOSIS — Z7984 Long term (current) use of oral hypoglycemic drugs: Secondary | ICD-10-CM | POA: Diagnosis not present

## 2023-05-17 DIAGNOSIS — E1165 Type 2 diabetes mellitus with hyperglycemia: Secondary | ICD-10-CM | POA: Diagnosis not present

## 2023-05-17 NOTE — Telephone Encounter (Signed)
SW husband, he asked if I can mail the letter to his home address along with DME company information. This has been completed and in outgoing mail

## 2023-05-17 NOTE — Telephone Encounter (Signed)
Letter needed for [employer], letter finished.

## 2023-05-20 DIAGNOSIS — Z7984 Long term (current) use of oral hypoglycemic drugs: Secondary | ICD-10-CM | POA: Diagnosis not present

## 2023-05-20 DIAGNOSIS — Z89612 Acquired absence of left leg above knee: Secondary | ICD-10-CM | POA: Diagnosis not present

## 2023-05-20 DIAGNOSIS — E1159 Type 2 diabetes mellitus with other circulatory complications: Secondary | ICD-10-CM | POA: Diagnosis not present

## 2023-05-20 DIAGNOSIS — E1165 Type 2 diabetes mellitus with hyperglycemia: Secondary | ICD-10-CM | POA: Diagnosis not present

## 2023-05-20 DIAGNOSIS — D638 Anemia in other chronic diseases classified elsewhere: Secondary | ICD-10-CM | POA: Diagnosis not present

## 2023-05-20 DIAGNOSIS — I10 Essential (primary) hypertension: Secondary | ICD-10-CM | POA: Diagnosis not present

## 2023-05-20 DIAGNOSIS — Z4781 Encounter for orthopedic aftercare following surgical amputation: Secondary | ICD-10-CM | POA: Diagnosis not present

## 2023-05-23 ENCOUNTER — Other Ambulatory Visit: Payer: Medicare Other

## 2023-05-23 DIAGNOSIS — E1165 Type 2 diabetes mellitus with hyperglycemia: Secondary | ICD-10-CM | POA: Diagnosis not present

## 2023-05-23 DIAGNOSIS — Z89612 Acquired absence of left leg above knee: Secondary | ICD-10-CM | POA: Diagnosis not present

## 2023-05-23 DIAGNOSIS — E1159 Type 2 diabetes mellitus with other circulatory complications: Secondary | ICD-10-CM | POA: Diagnosis not present

## 2023-05-23 DIAGNOSIS — I10 Essential (primary) hypertension: Secondary | ICD-10-CM | POA: Diagnosis not present

## 2023-05-23 DIAGNOSIS — Z4781 Encounter for orthopedic aftercare following surgical amputation: Secondary | ICD-10-CM | POA: Diagnosis not present

## 2023-05-23 DIAGNOSIS — D638 Anemia in other chronic diseases classified elsewhere: Secondary | ICD-10-CM | POA: Diagnosis not present

## 2023-05-23 DIAGNOSIS — Z7984 Long term (current) use of oral hypoglycemic drugs: Secondary | ICD-10-CM | POA: Diagnosis not present

## 2023-05-24 DIAGNOSIS — E113513 Type 2 diabetes mellitus with proliferative diabetic retinopathy with macular edema, bilateral: Secondary | ICD-10-CM | POA: Diagnosis not present

## 2023-05-24 DIAGNOSIS — Z794 Long term (current) use of insulin: Secondary | ICD-10-CM | POA: Diagnosis not present

## 2023-05-27 DIAGNOSIS — Z7984 Long term (current) use of oral hypoglycemic drugs: Secondary | ICD-10-CM | POA: Diagnosis not present

## 2023-05-27 DIAGNOSIS — D638 Anemia in other chronic diseases classified elsewhere: Secondary | ICD-10-CM | POA: Diagnosis not present

## 2023-05-27 DIAGNOSIS — E1159 Type 2 diabetes mellitus with other circulatory complications: Secondary | ICD-10-CM | POA: Diagnosis not present

## 2023-05-27 DIAGNOSIS — Z4781 Encounter for orthopedic aftercare following surgical amputation: Secondary | ICD-10-CM | POA: Diagnosis not present

## 2023-05-27 DIAGNOSIS — Z89612 Acquired absence of left leg above knee: Secondary | ICD-10-CM | POA: Diagnosis not present

## 2023-05-27 DIAGNOSIS — I10 Essential (primary) hypertension: Secondary | ICD-10-CM | POA: Diagnosis not present

## 2023-05-27 DIAGNOSIS — E1165 Type 2 diabetes mellitus with hyperglycemia: Secondary | ICD-10-CM | POA: Diagnosis not present

## 2023-05-31 DIAGNOSIS — I10 Essential (primary) hypertension: Secondary | ICD-10-CM | POA: Diagnosis not present

## 2023-05-31 DIAGNOSIS — Z4781 Encounter for orthopedic aftercare following surgical amputation: Secondary | ICD-10-CM | POA: Diagnosis not present

## 2023-05-31 DIAGNOSIS — D638 Anemia in other chronic diseases classified elsewhere: Secondary | ICD-10-CM | POA: Diagnosis not present

## 2023-05-31 DIAGNOSIS — Z89612 Acquired absence of left leg above knee: Secondary | ICD-10-CM | POA: Diagnosis not present

## 2023-05-31 DIAGNOSIS — Z7984 Long term (current) use of oral hypoglycemic drugs: Secondary | ICD-10-CM | POA: Diagnosis not present

## 2023-05-31 DIAGNOSIS — E1159 Type 2 diabetes mellitus with other circulatory complications: Secondary | ICD-10-CM | POA: Diagnosis not present

## 2023-05-31 DIAGNOSIS — E1165 Type 2 diabetes mellitus with hyperglycemia: Secondary | ICD-10-CM | POA: Diagnosis not present

## 2023-06-03 ENCOUNTER — Telehealth: Payer: Self-pay | Admitting: Orthopedic Surgery

## 2023-06-03 ENCOUNTER — Other Ambulatory Visit: Payer: Self-pay | Admitting: Cardiology

## 2023-06-03 DIAGNOSIS — Z7984 Long term (current) use of oral hypoglycemic drugs: Secondary | ICD-10-CM | POA: Diagnosis not present

## 2023-06-03 DIAGNOSIS — E1159 Type 2 diabetes mellitus with other circulatory complications: Secondary | ICD-10-CM | POA: Diagnosis not present

## 2023-06-03 DIAGNOSIS — I509 Heart failure, unspecified: Secondary | ICD-10-CM

## 2023-06-03 DIAGNOSIS — D638 Anemia in other chronic diseases classified elsewhere: Secondary | ICD-10-CM | POA: Diagnosis not present

## 2023-06-03 DIAGNOSIS — Z4781 Encounter for orthopedic aftercare following surgical amputation: Secondary | ICD-10-CM | POA: Diagnosis not present

## 2023-06-03 DIAGNOSIS — Z89612 Acquired absence of left leg above knee: Secondary | ICD-10-CM | POA: Diagnosis not present

## 2023-06-03 DIAGNOSIS — I10 Essential (primary) hypertension: Secondary | ICD-10-CM | POA: Diagnosis not present

## 2023-06-03 DIAGNOSIS — E1165 Type 2 diabetes mellitus with hyperglycemia: Secondary | ICD-10-CM | POA: Diagnosis not present

## 2023-06-03 NOTE — Telephone Encounter (Signed)
Charrise called. She is the PT working with patient in home. She says she is discharging home PT today. Patient would like PT at [facility]. Her cb# [phone]

## 2023-06-04 ENCOUNTER — Other Ambulatory Visit: Payer: Self-pay

## 2023-06-04 DIAGNOSIS — S78112A Complete traumatic amputation at level between left hip and knee, initial encounter: Secondary | ICD-10-CM

## 2023-06-04 NOTE — Telephone Encounter (Signed)
Order in chart as requested below.

## 2023-06-06 DIAGNOSIS — M6281 Muscle weakness (generalized): Secondary | ICD-10-CM | POA: Diagnosis not present

## 2023-06-06 DIAGNOSIS — R1312 Dysphagia, oropharyngeal phase: Secondary | ICD-10-CM | POA: Diagnosis not present

## 2023-06-06 DIAGNOSIS — M6259 Muscle wasting and atrophy, not elsewhere classified, multiple sites: Secondary | ICD-10-CM | POA: Diagnosis not present

## 2023-06-06 DIAGNOSIS — R262 Difficulty in walking, not elsewhere classified: Secondary | ICD-10-CM | POA: Diagnosis not present

## 2023-06-06 DIAGNOSIS — I509 Heart failure, unspecified: Secondary | ICD-10-CM | POA: Diagnosis not present

## 2023-06-06 DIAGNOSIS — J952 Acute pulmonary insufficiency following nonthoracic surgery: Secondary | ICD-10-CM | POA: Diagnosis not present

## 2023-06-06 DIAGNOSIS — M726 Necrotizing fasciitis: Secondary | ICD-10-CM | POA: Diagnosis not present

## 2023-06-13 ENCOUNTER — Ambulatory Visit: Payer: Medicare Other | Attending: Orthopedic Surgery | Admitting: Physical Therapy

## 2023-06-25 ENCOUNTER — Ambulatory Visit: Payer: Medicare Other | Attending: Orthopedic Surgery | Admitting: Physical Therapy

## 2023-07-09 DIAGNOSIS — E1165 Type 2 diabetes mellitus with hyperglycemia: Secondary | ICD-10-CM | POA: Diagnosis not present

## 2023-07-09 DIAGNOSIS — I10 Essential (primary) hypertension: Secondary | ICD-10-CM | POA: Diagnosis not present

## 2023-07-09 DIAGNOSIS — M25511 Pain in right shoulder: Secondary | ICD-10-CM | POA: Diagnosis not present

## 2023-07-13 IMAGING — CT CT EXTREM LOW WO/W CM*L*
2 of 4 series · 9 of 33 positions shown, 11 images · non-contrast
Comparison: None Available.

CLINICAL DATA: Large fluid blister along the distal left lower leg

EXAM:
CT OF THE LOWER LEFT EXTREMITY WITHOUT CONTRAST
TECHNIQUE: Multidetector CT imaging of the lower left extremity was performed
following the standard protocol before and during bolus
administration of intravenous contrast.

[Series 4: lower ext 1.5 ax st · axial · 0.34mm/px · z∈[-1338,-926]mm · 6 of 367 slices shown, 8 images]
[im 57/367  soft-tissue]
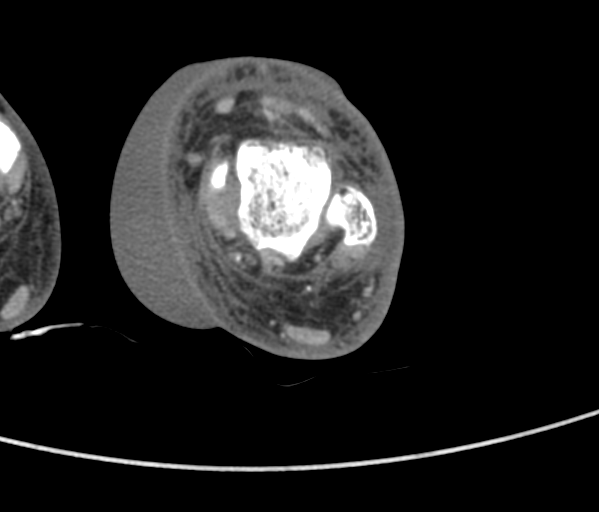
[im 57/367  bone]
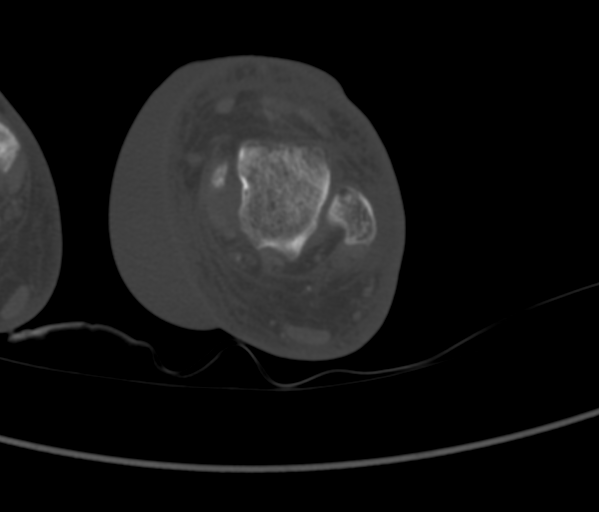
[im 113/367  bone]
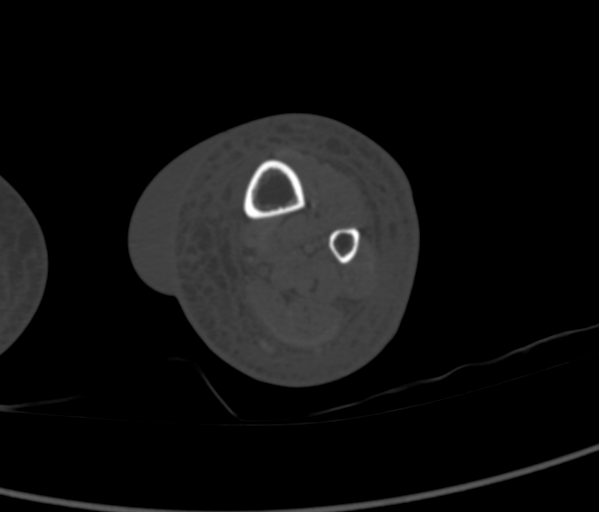
[im 169/367  bone]
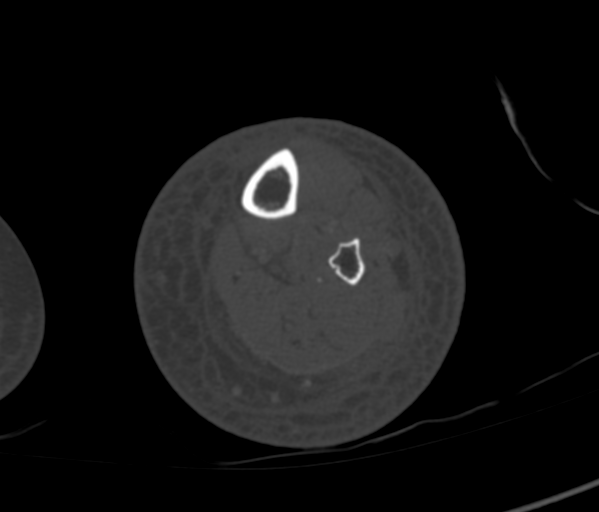
[im 226/367  bone]
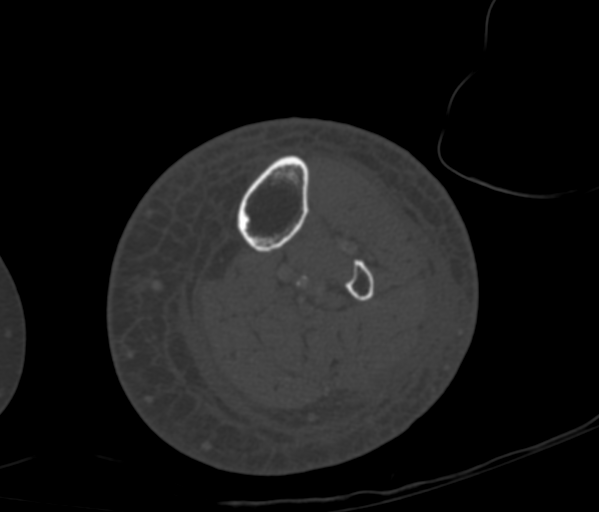
[im 282/367  soft-tissue]
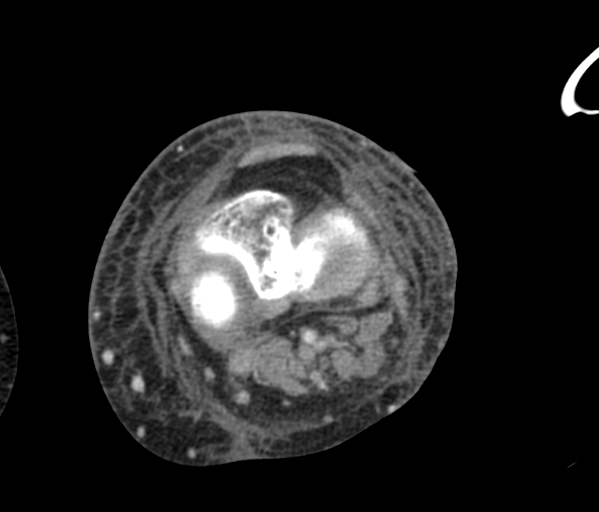
[im 282/367  bone]
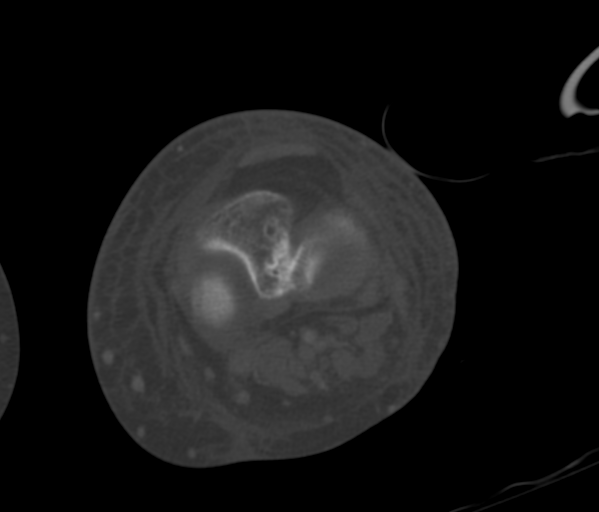
[im 338/367  bone]
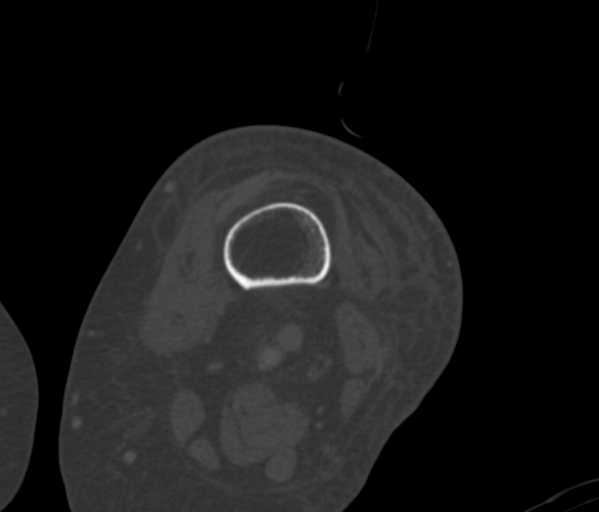

[Series 9: lower ext cor st · coronal · 0.32mm/px · 3 of 126 slices shown]
[im 26/126  bone]
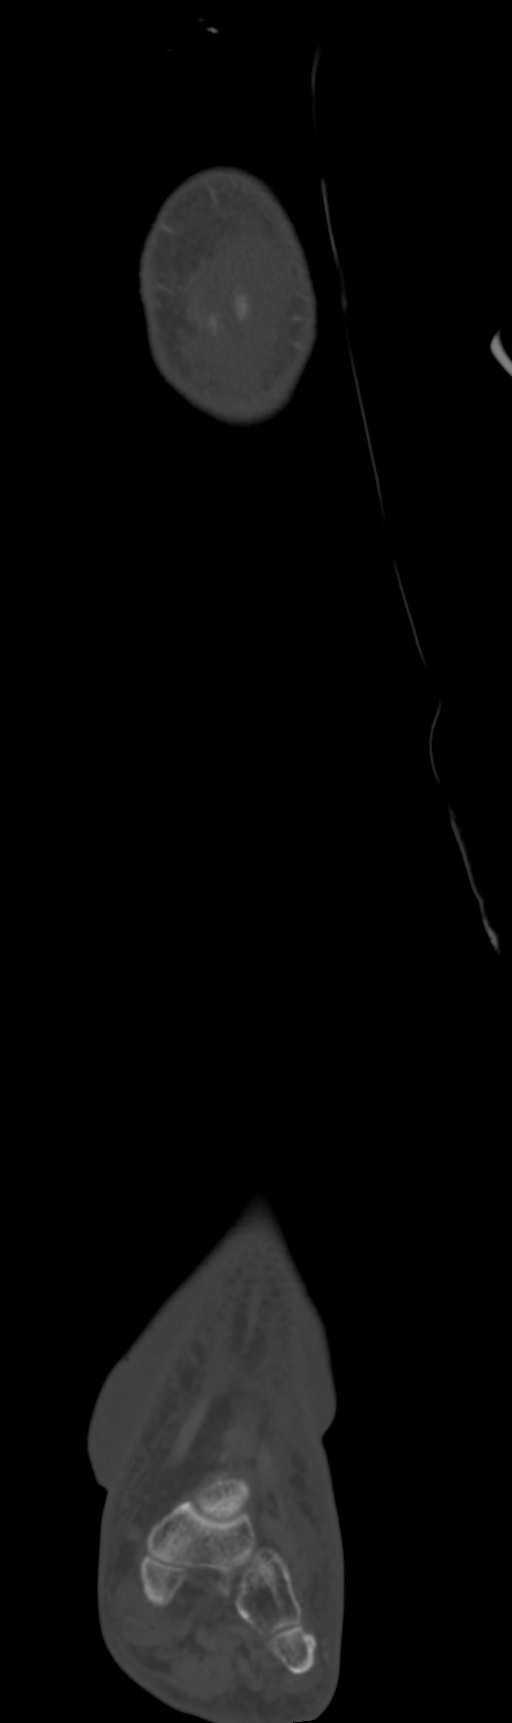
[im 51/126  bone]
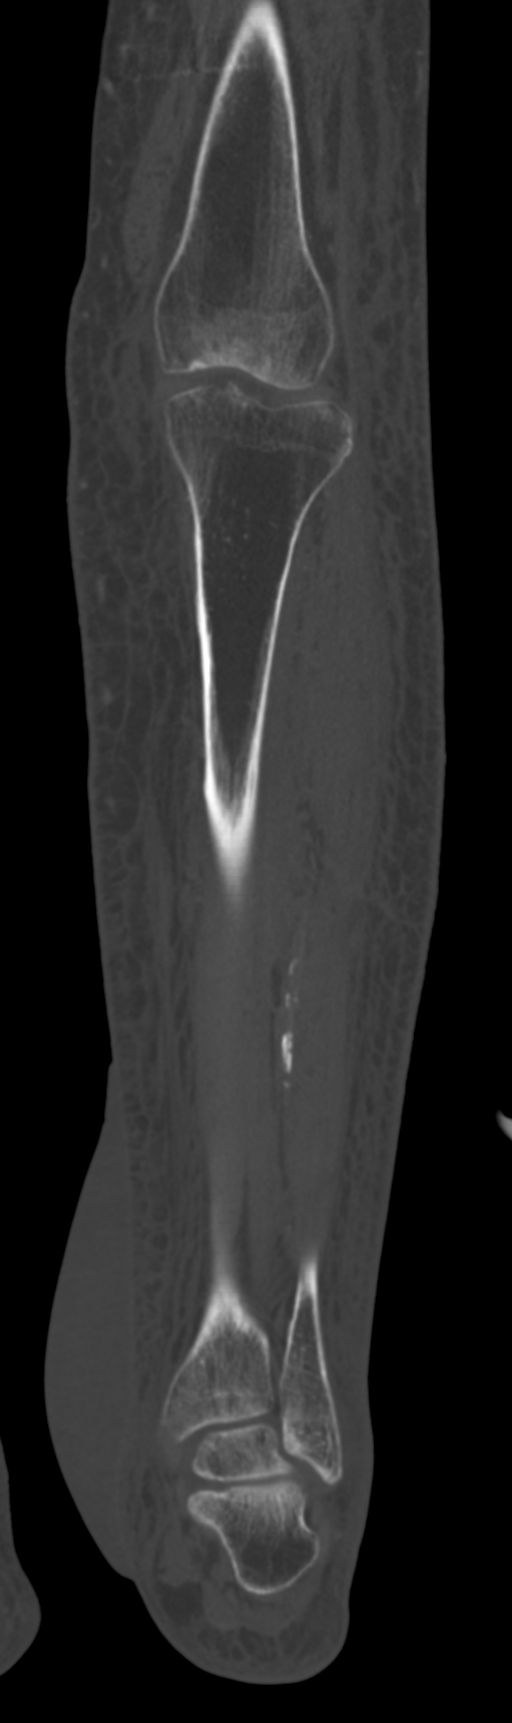
[im 76/126  bone]
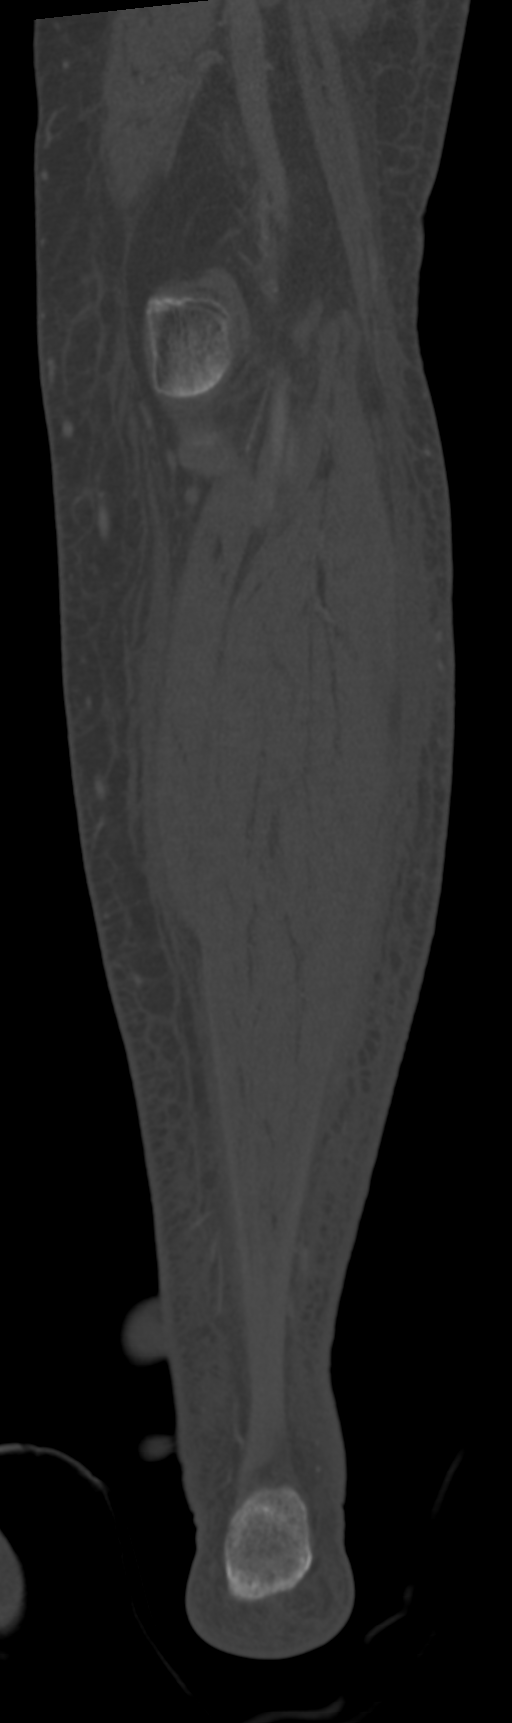

[9 of 33 positions shown; findings below may reference images not displayed]

RADIATION DOSE REDUCTION: This exam was performed according to the
departmental dose-optimization program which includes automated
exposure control, adjustment of the mA and/or kV according to
patient size and/or use of iterative reconstruction technique.

CONTRAST:  100mL OMNIPAQUE IOHEXOL 300 MG/ML  SOLN
FINDINGS: Bones/Joint/Cartilage

Generalized osteopenia. No fracture or dislocation. Normal
alignment. No joint effusion.

Ligaments

Ligaments are suboptimally evaluated by CT.

Muscles and Tendons
Muscles are normal. No muscle atrophy. No intramuscular fluid
collection or hematoma.

Soft tissue
Soft tissue edema throughout the left lower leg consistent with
cellulitis. Soft tissue emphysema in the medial aspect of the distal
left lower leg within the subcutaneous fat. Large skin blister along
the distal medial lower leg/ankle measuring 8.3 x 2.8 x 1.6 cm.
Peripheral vascular atherosclerotic disease. No soft tissue mass.
IMPRESSION: 1. Soft tissue edema throughout the left lower leg consistent with
cellulitis. Soft tissue emphysema in the medial aspect of the distal
left lower leg within the subcutaneous fat concerning for
necrotizing infection. Large skin blister along the distal medial
lower leg/ankle measuring 8.3 x 2.8 x 1.6 cm.

## 2023-07-19 DIAGNOSIS — Z794 Long term (current) use of insulin: Secondary | ICD-10-CM | POA: Diagnosis not present

## 2023-07-19 DIAGNOSIS — H4053X0 Glaucoma secondary to other eye disorders, bilateral, stage unspecified: Secondary | ICD-10-CM | POA: Diagnosis not present

## 2023-07-19 DIAGNOSIS — E1136 Type 2 diabetes mellitus with diabetic cataract: Secondary | ICD-10-CM | POA: Diagnosis not present

## 2023-07-19 DIAGNOSIS — E113513 Type 2 diabetes mellitus with proliferative diabetic retinopathy with macular edema, bilateral: Secondary | ICD-10-CM | POA: Diagnosis not present

## 2023-08-15 ENCOUNTER — Ambulatory Visit: Payer: Medicare Other | Admitting: Cardiology

## 2023-08-15 DIAGNOSIS — K5900 Constipation, unspecified: Secondary | ICD-10-CM | POA: Diagnosis not present

## 2023-08-15 DIAGNOSIS — Z89612 Acquired absence of left leg above knee: Secondary | ICD-10-CM | POA: Diagnosis not present

## 2023-08-15 DIAGNOSIS — Z8639 Personal history of other endocrine, nutritional and metabolic disease: Secondary | ICD-10-CM | POA: Diagnosis not present

## 2023-08-15 DIAGNOSIS — K219 Gastro-esophageal reflux disease without esophagitis: Secondary | ICD-10-CM | POA: Diagnosis not present

## 2023-08-15 DIAGNOSIS — Z8679 Personal history of other diseases of the circulatory system: Secondary | ICD-10-CM | POA: Diagnosis not present

## 2023-08-16 DIAGNOSIS — E1140 Type 2 diabetes mellitus with diabetic neuropathy, unspecified: Secondary | ICD-10-CM | POA: Diagnosis not present

## 2023-08-16 DIAGNOSIS — E1165 Type 2 diabetes mellitus with hyperglycemia: Secondary | ICD-10-CM | POA: Diagnosis not present

## 2023-08-16 DIAGNOSIS — D649 Anemia, unspecified: Secondary | ICD-10-CM | POA: Diagnosis not present

## 2023-08-16 DIAGNOSIS — E441 Mild protein-calorie malnutrition: Secondary | ICD-10-CM | POA: Diagnosis not present

## 2023-08-16 DIAGNOSIS — I10 Essential (primary) hypertension: Secondary | ICD-10-CM | POA: Diagnosis not present

## 2023-08-16 DIAGNOSIS — H409 Unspecified glaucoma: Secondary | ICD-10-CM | POA: Diagnosis not present

## 2023-08-16 DIAGNOSIS — Z89612 Acquired absence of left leg above knee: Secondary | ICD-10-CM | POA: Diagnosis not present

## 2023-08-16 DIAGNOSIS — K219 Gastro-esophageal reflux disease without esophagitis: Secondary | ICD-10-CM | POA: Diagnosis not present

## 2023-08-16 DIAGNOSIS — I700 Atherosclerosis of aorta: Secondary | ICD-10-CM | POA: Diagnosis not present

## 2023-09-02 DIAGNOSIS — Z794 Long term (current) use of insulin: Secondary | ICD-10-CM | POA: Diagnosis not present

## 2023-09-02 DIAGNOSIS — E113513 Type 2 diabetes mellitus with proliferative diabetic retinopathy with macular edema, bilateral: Secondary | ICD-10-CM | POA: Diagnosis not present

## 2023-10-11 DIAGNOSIS — Z794 Long term (current) use of insulin: Secondary | ICD-10-CM | POA: Diagnosis not present

## 2023-10-11 DIAGNOSIS — E113513 Type 2 diabetes mellitus with proliferative diabetic retinopathy with macular edema, bilateral: Secondary | ICD-10-CM | POA: Diagnosis not present

## 2023-10-14 ENCOUNTER — Ambulatory Visit: Payer: Medicare Other

## 2023-10-14 VITALS — Ht 61.0 in | Wt 102.0 lb

## 2023-10-14 DIAGNOSIS — Z0000 Encounter for general adult medical examination without abnormal findings: Secondary | ICD-10-CM

## 2023-10-14 NOTE — Progress Notes (Cosign Needed)
Subjective:   Jill Munoz is a 72 y.o. female who presents for Medicare Annual (Subsequent) preventive examination.  Visit Complete: Virtual I connected with  [name] on 10/14/23 by a audio enabled telemedicine application and verified that I am speaking with the correct person using two identifiers.  Patient Location: Home  Provider Location: Home Office  I discussed the limitations of evaluation and management by telemedicine. The patient expressed understanding and agreed to proceed.  Vital Signs: Because this visit was a virtual/telehealth visit, some criteria may be missing or patient reported. Any vitals not documented were not able to be obtained and vitals that have been documented are patient reported.   Cardiac Risk Factors include: advanced age (>41men, >46 women);diabetes mellitus;hypertension     Objective:    Today's Vitals   10/14/23 1542  Weight: 102 lb (46.3 kg)  Height: 5\' 1"  (1.549 m)   Body mass index is 19.27 kg/m.     10/14/2023    3:55 PM 10/09/2022   10:08 AM 04/06/2022    8:21 AM 04/02/2022   10:16 AM  Advanced Directives  Does Patient Have a Medical Advance Directive? Yes Yes No No  Type of Estate agent of [place];Living will Healthcare Power of [place];Living will    Does patient want to make changes to medical advance directive?  No - Patient declined    Copy of Healthcare Power of Attorney in Chart? No - copy requested No - copy requested    Would patient like information on creating a medical advance directive?  No - Patient declined No - Patient declined No - Patient declined    Current Medications (verified) Outpatient Encounter Medications as of 10/14/2023  Medication Sig   Ascorbic Acid (VITAMIN C) 1000 MG tablet Take 1,000 mg by mouth daily.   Berberine Chloride 500 MG CAPS Take 1 capsule by mouth in the morning, at noon, and at bedtime.   brimonidine (ALPHAGAN) 0.2 % ophthalmic solution Place 1  drop into both eyes 3 (three) times daily.   dapagliflozin propanediol (FARXIGA) 10 MG TABS tablet TAKE 1 TABLET BY MOUTH DAILY BEFORE BREAKFAST.   dorzolamide-timolol (COSOPT) 22.3-6.8 MG/ML ophthalmic solution Place 1 drop into both eyes 2 (two) times daily.   glucose blood (ACCU-CHEK GUIDE) test strip Use as instructed once a day   losartan (COZAAR) 50 MG tablet TAKE 1 TABLET BY MOUTH DAILY AT 10PM   melatonin 3 MG TABS tablet Take 3 mg by mouth at bedtime.   pantoprazole (PROTONIX) 40 MG tablet Take 1 tablet (40 mg total) by mouth daily.   rosuvastatin (CRESTOR) 20 MG tablet Take 1 tablet (20 mg total) by mouth at bedtime.   sennosides-docusate sodium (SENOKOT-S) 8.6-50 MG tablet Take 1 tablet by mouth daily as needed for constipation.   spironolactone (ALDACTONE) 25 MG tablet Take 1 tablet (25 mg total) by mouth daily.   No facility-administered encounter medications on file as of 10/14/2023.    Allergies (verified) Patient has no known allergies.   History: Past Medical History:  Diagnosis Date   Anemia    Diabetes mellitus without complication (HCC)    Pre-diabetes    Past Surgical History:  Procedure Laterality Date   AMPUTATION  04/06/2022   Procedure: AMPUTATION ABOVE KNEE REVISION;  Surgeon: [name], MD;  Location: [facility] OR;  Service: Orthopedics;;   I & D EXTREMITY Left 04/02/2022   Procedure: IRRIGATION AND DEBRIDEMENT LEFT LOWER LEG APPLICATION OF WOUND VAC ;  Surgeon: Ernestina Columbia,  MD;  Location: [facility] OR;  Service: Orthopedics;  Laterality: Left;   I & D EXTREMITY Left 04/04/2022   Procedure: LEFT ABOVE KNEE AMPUTATION;  Surgeon: [name], MD;  Location: [facility] OR;  Service: Orthopedics;  Laterality: Left;   I & D EXTREMITY Left 04/06/2022   Procedure: LEFT LEG DEBRIDEMENT;  Surgeon: [name], MD;  Location: [facility] OR;  Service: Orthopedics;  Laterality: Left;   STUMP REVISION Left 04/11/2022   Procedure: REVISION LEFT ABOVE KNEE AMPUTATION;  Surgeon: [name], MD;  Location: [facility] OR;  Service: Orthopedics;  Laterality: Left;   Family History  Problem Relation Age of Onset   Heart attack Mother    Heart disease Mother    Social History   Socioeconomic History   Marital status: Married    Spouse name: Not on file   Number of children: Not on file   Years of education: Not on file   Highest education level: Not on file  Occupational History   Not on file  Tobacco Use   Smoking status: Never   Smokeless tobacco: Never  Vaping Use   Vaping status: Never Used  Substance and Sexual Activity   Alcohol use: Never   Drug use: Never   Sexual activity: Not on file  Other Topics Concern   Not on file  Social History Narrative   Not on file   Social Drivers of Health   Financial Resource Strain: Low Risk  (10/14/2023)   Overall Financial Resource Strain (CARDIA)    Difficulty of Paying Living Expenses: Not hard at all  Food Insecurity: No Food Insecurity (10/14/2023)   Hunger Vital Sign    Worried About Running Out of Food in the Last Year: Never true    Ran Out of Food in the Last Year: Never true  Transportation Needs: No Transportation Needs (10/14/2023)   PRAPARE - Administrator, Civil Service (Medical): No    Lack of Transportation (Non-Medical): No  Physical Activity: Inactive (10/14/2023)   Exercise Vital Sign    Days of Exercise per Week: 0 days    Minutes of Exercise per Session: 0 min  Stress: No Stress Concern Present (10/14/2023)   [employer] of Occupational Health - Occupational Stress Questionnaire    Feeling of Stress : Not at all  Social Connections: Socially Integrated (10/14/2023)   Social Connection and Isolation Panel [NHANES]    Frequency of Communication with Friends and Family: More than three times a week    Frequency of Social Gatherings with Friends and Family: More than three times a week    Attends Religious Services: More than 4 times per year    Active Member of [employer] or  Organizations: Yes    Attends Engineer, structural: More than 4 times per year    Marital Status: Married    Tobacco Counseling Counseling given: Not Answered   Clinical Intake:  Pre-visit preparation completed: Yes  Pain : No/denies pain     BMI - recorded: 19.27 Nutritional Status: BMI of 19-24  Normal Nutritional Risks: None Diabetes: Yes CBG done?: No Did pt. bring in CBG monitor from home?: No  How often do you need to have someone help you when you read instructions, pamphlets, or other written materials from your doctor or pharmacy?: 4 - Often (Husband assist)  Interpreter Needed?: No  Information entered by :: Theresa Mulligan LPN   Activities of Daily Living    10/14/2023  3:51 PM  In your present state of health, do you have any difficulty performing the following activities:  Hearing? 0  Vision? 0  Difficulty concentrating or making decisions? 0  Walking or climbing stairs? 1  Comment Uses [employer] or bathing? 1  Comment Husband assist  Doing errands, shopping? 1  Comment Husband Ship broker and eating ? Y  Comment Husband assist  Using the Toilet? Y  Comment Husband Assist  In the past six months, have you accidently leaked urine? N  Do you have problems with loss of bowel control? N  Managing your Medications? Y  Comment Husband assist  Managing your Finances? Y  Comment Husband assist  Housekeeping or managing your Housekeeping? Y  Comment Husband assist    Patient Care Team: Karie Georges, MD as PCP - General (Family Medicine)  Indicate any recent Medical Services you may have received from other than [facility] providers in the past year (date may be approximate).     Assessment:   This is a routine wellness examination for Marieann.  Hearing/Vision screen Hearing Screening - Comments:: Denies hearing difficulties   Vision Screening - Comments:: - up to date with routine eye exams with  Dr  Clovis Riley   Goals Addressed               This Visit's Progress     Patient stated (pt-stated)        Continued goal, I want to walk on my own w/o use of medical devices.       Depression Screen    10/14/2023    3:50 PM 10/09/2022   10:00 AM  PHQ 2/9 Scores  PHQ - 2 Score 0 0    Fall Risk    10/14/2023    3:53 PM 10/09/2022   10:06 AM 04/26/2022    2:05 PM  Fall Risk   Falls in the past year? 0 0 0  Number falls in past yr: 0 0   Injury with Fall? 0 0   Risk for fall due to : No Fall Risks No Fall Risks Impaired balance/gait;Impaired mobility  Follow up Falls prevention discussed Falls prevention discussed Falls evaluation completed    MEDICARE RISK AT HOME:    TIMED UP AND GO:  Was the test performed?  No    Cognitive Function:        10/14/2023    3:56 PM 10/09/2022   10:08 AM  6CIT Screen  What Year? 0 points 0 points  What month? 0 points 0 points  What time? 0 points 0 points  Count back from 20 0 points 0 points  Months in reverse 0 points 0 points  Repeat phrase 0 points 0 points  Total Score 0 points 0 points    Immunizations  There is no immunization history on file for this patient.  TDAP status: Due, Education has been provided regarding the importance of this vaccine. Advised may receive this vaccine at local pharmacy or Health Dept. Aware to provide a copy of the vaccination record if obtained from local pharmacy or Health Dept. Verbalized acceptance and understanding.  Flu Vaccine status: Declined, Education has been provided regarding the importance of this vaccine but patient still declined. Advised may receive this vaccine at local pharmacy or Health Dept. Aware to provide a copy of the vaccination record if obtained from local pharmacy or Health Dept. Verbalized acceptance and understanding.  Pneumococcal vaccine status: Declined,  Education has been  provided regarding the importance of this vaccine but patient still declined.  Advised may receive this vaccine at local pharmacy or Health Dept. Aware to provide a copy of the vaccination record if obtained from local pharmacy or Health Dept. Verbalized acceptance and understanding.   Covid-19 vaccine status: Declined, Education has been provided regarding the importance of this vaccine but patient still declined. Advised may receive this vaccine at local pharmacy or Health Dept.or vaccine clinic. Aware to provide a copy of the vaccination record if obtained from local pharmacy or Health Dept. Verbalized acceptance and understanding.  Qualifies for Shingles Vaccine? Yes   Zostavax completed No   Shingrix Completed?: No.    Education has been provided regarding the importance of this vaccine. Patient has been advised to call insurance company to determine out of pocket expense if they have not yet received this vaccine. Advised may also receive vaccine at local pharmacy or Health Dept. Verbalized acceptance and understanding.  Screening Tests Health Maintenance  Topic Date Due   Pneumonia Vaccine 47+ Years old (1 of 2 - PCV) Never done   FOOT EXAM  Never done   OPHTHALMOLOGY EXAM  Never done   Hepatitis C Screening  Never done   DTaP/Tdap/Td (1 - Tdap) Never done   Colonoscopy  Never done   MAMMOGRAM  Never done   Zoster Vaccines- Shingrix (1 of 2) Never done   DEXA SCAN  Never done   HEMOGLOBIN A1C  02/19/2023   INFLUENZA VACCINE  Never done   COVID-19 Vaccine (1 - 2024-25 season) Never done   Diabetic kidney evaluation - Urine ACR  08/25/2023   Diabetic kidney evaluation - eGFR measurement  02/15/2024   Medicare Annual Wellness (AWV)  10/13/2024   HPV VACCINES  Aged Out    Health Maintenance  Health Maintenance Due  Topic Date Due   Pneumonia Vaccine 49+ Years old (1 of 2 - PCV) Never done   FOOT EXAM  Never done   OPHTHALMOLOGY EXAM  Never done   Hepatitis C Screening  Never done   DTaP/Tdap/Td (1 - Tdap) Never done   Colonoscopy  Never done    MAMMOGRAM  Never done   Zoster Vaccines- Shingrix (1 of 2) Never done   DEXA SCAN  Never done   HEMOGLOBIN A1C  02/19/2023   INFLUENZA VACCINE  Never done   COVID-19 Vaccine (1 - 2024-25 season) Never done   Diabetic kidney evaluation - Urine ACR  08/25/2023    Colorectal cancer screening: Referral to GI placed Deferred. Pt aware the office will call re: appt.  Mammogram status: Ordered Deferred. Pt provided with contact info and advised to call to schedule appt.   Bone Density status: Ordered Deferred. Pt provided with contact info and advised to call to schedule appt.    Additional Screening:  Hepatitis C Screening: does qualify;  Deferred  Vision Screening: Recommended annual ophthalmology exams for early detection of glaucoma and other disorders of the eye. Is the patient up to date with their annual eye exam?  Yes  Who is the provider or what is the name of the office in which the patient attends annual eye exams? Dr Clovis Riley If pt is not established with a provider, would they like to be referred to a provider to establish care? No .   Dental Screening: Recommended annual dental exams for proper oral hygiene  Diabetic Foot Exam: Diabetic Foot Exam: Overdue, Pt has been advised about the importance in completing this exam. Pt  is scheduled for diabetic foot exam on Deferred.  Community Resource Referral / Chronic Care Management:  CRR required this visit?  No   CCM required this visit?  No     Plan:     I have personally reviewed and noted the following in the patient's chart:   Medical and social history Use of alcohol, tobacco or illicit drugs  Current medications and supplements including opioid prescriptions. Patient is not currently taking opioid prescriptions. Functional ability and status Nutritional status Physical activity Advanced directives List of other physicians Hospitalizations, surgeries, and ER visits in previous 12 months Vitals Screenings to  include cognitive, depression, and falls Referrals and appointments  In addition, I have reviewed and discussed with patient certain preventive protocols, quality metrics, and best practice recommendations. A written personalized care plan for preventive services as well as general preventive health recommendations were provided to patient.     Tillie Rung, LPN   [id number]/07/[id number]   After Visit Summary: (MyChart) Due to this being a telephonic visit, the after visit summary with patients personalized plan was offered to patient via MyChart   Nurse Notes: None

## 2023-10-14 NOTE — Patient Instructions (Addendum)
Ms. Denbow , Thank you for taking time to come for your Medicare Wellness Visit. I appreciate your ongoing commitment to your health goals. Please review the following plan we discussed and let me know if I can assist you in the future.   Referrals/Orders/Follow-Ups/Clinician Recommendations:   This is a list of the screening recommended for you and due dates:  Health Maintenance  Topic Date Due   Pneumonia Vaccine (1 of 2 - PCV) Never done   Complete foot exam   Never done   Eye exam for diabetics  Never done   Hepatitis C Screening  Never done   DTaP/Tdap/Td vaccine (1 - Tdap) Never done   Colon Cancer Screening  Never done   Mammogram  Never done   Zoster (Shingles) Vaccine (1 of 2) Never done   DEXA scan (bone density measurement)  Never done   Hemoglobin A1C  02/19/2023   Flu Shot  Never done   COVID-19 Vaccine (1 - 2024-25 season) Never done   Yearly kidney health urinalysis for diabetes  08/25/2023   Yearly kidney function blood test for diabetes  02/15/2024   Medicare Annual Wellness Visit  10/13/2024   HPV Vaccine  Aged Out    Advanced directives: (Copy Requested) Please bring a copy of your health care power of attorney and living will to the office to be added to your chart at your convenience.  Next Medicare Annual Wellness Visit scheduled for next year: Yes

## 2023-10-17 ENCOUNTER — Telehealth: Payer: Self-pay

## 2023-10-17 NOTE — Telephone Encounter (Signed)
 Copied from CRM [id number]. Topic: General - Other >> Oct 14, 2023 10:57 AM Franky GRADE wrote: Reason for CRM: Patient missed their phone call for their  MEDICARE AWV, [employer], they would like to know if they can be called again sometime today or if they would need to R/S for a different day.

## 2023-10-21 NOTE — Telephone Encounter (Signed)
 AWV was completed on 10/14/2023 by Charolette Child office note.

## 2023-11-22 DIAGNOSIS — H4053X4 Glaucoma secondary to other eye disorders, bilateral, indeterminate stage: Secondary | ICD-10-CM | POA: Diagnosis not present

## 2023-11-22 DIAGNOSIS — E113513 Type 2 diabetes mellitus with proliferative diabetic retinopathy with macular edema, bilateral: Secondary | ICD-10-CM | POA: Diagnosis not present

## 2023-11-22 DIAGNOSIS — Z794 Long term (current) use of insulin: Secondary | ICD-10-CM | POA: Diagnosis not present

## 2023-12-02 ENCOUNTER — Telehealth: Payer: Self-pay | Admitting: Orthopedic Surgery

## 2023-12-02 NOTE — Telephone Encounter (Signed)
 Patient request handicap parking form

## 2023-12-02 NOTE — Telephone Encounter (Signed)
 Form completed called to advise and asked if I could put in the mail. This has been done.

## 2023-12-10 ENCOUNTER — Other Ambulatory Visit: Payer: Self-pay | Admitting: Family Medicine

## 2023-12-10 DIAGNOSIS — K219 Gastro-esophageal reflux disease without esophagitis: Secondary | ICD-10-CM

## 2023-12-10 DIAGNOSIS — R109 Unspecified abdominal pain: Secondary | ICD-10-CM

## 2024-01-17 DIAGNOSIS — E113513 Type 2 diabetes mellitus with proliferative diabetic retinopathy with macular edema, bilateral: Secondary | ICD-10-CM | POA: Diagnosis not present

## 2024-01-17 DIAGNOSIS — E083513 Diabetes mellitus due to underlying condition with proliferative diabetic retinopathy with macular edema, bilateral: Secondary | ICD-10-CM | POA: Diagnosis not present

## 2024-01-17 DIAGNOSIS — Z794 Long term (current) use of insulin: Secondary | ICD-10-CM | POA: Diagnosis not present

## 2024-02-08 ENCOUNTER — Encounter (HOSPITAL_COMMUNITY): Payer: Self-pay | Admitting: *Deleted

## 2024-02-08 ENCOUNTER — Emergency Department (HOSPITAL_COMMUNITY)

## 2024-02-08 ENCOUNTER — Inpatient Hospital Stay (HOSPITAL_COMMUNITY)
Admission: EM | Admit: 2024-02-08 | Discharge: 2024-02-12 | DRG: 291 | Disposition: A | Discharge status: Home Health | Attending: Internal Medicine | Admitting: Internal Medicine

## 2024-02-08 ENCOUNTER — Other Ambulatory Visit: Payer: Self-pay

## 2024-02-08 DIAGNOSIS — E1151 Type 2 diabetes mellitus with diabetic peripheral angiopathy without gangrene: Secondary | ICD-10-CM | POA: Diagnosis present

## 2024-02-08 DIAGNOSIS — E871 Hypo-osmolality and hyponatremia: Secondary | ICD-10-CM | POA: Diagnosis not present

## 2024-02-08 DIAGNOSIS — E119 Type 2 diabetes mellitus without complications: Secondary | ICD-10-CM

## 2024-02-08 DIAGNOSIS — D500 Iron deficiency anemia secondary to blood loss (chronic): Secondary | ICD-10-CM | POA: Diagnosis not present

## 2024-02-08 DIAGNOSIS — Z89612 Acquired absence of left leg above knee: Secondary | ICD-10-CM

## 2024-02-08 DIAGNOSIS — J9601 Acute respiratory failure with hypoxia: Secondary | ICD-10-CM | POA: Diagnosis not present

## 2024-02-08 DIAGNOSIS — Z7984 Long term (current) use of oral hypoglycemic drugs: Secondary | ICD-10-CM

## 2024-02-08 DIAGNOSIS — Z993 Dependence on wheelchair: Secondary | ICD-10-CM | POA: Diagnosis not present

## 2024-02-08 DIAGNOSIS — D649 Anemia, unspecified: Secondary | ICD-10-CM | POA: Diagnosis not present

## 2024-02-08 DIAGNOSIS — D72819 Decreased white blood cell count, unspecified: Secondary | ICD-10-CM | POA: Diagnosis not present

## 2024-02-08 DIAGNOSIS — I5023 Acute on chronic systolic (congestive) heart failure: Secondary | ICD-10-CM | POA: Diagnosis not present

## 2024-02-08 DIAGNOSIS — I509 Heart failure, unspecified: Secondary | ICD-10-CM

## 2024-02-08 DIAGNOSIS — R0689 Other abnormalities of breathing: Secondary | ICD-10-CM | POA: Diagnosis not present

## 2024-02-08 DIAGNOSIS — D638 Anemia in other chronic diseases classified elsewhere: Secondary | ICD-10-CM | POA: Diagnosis not present

## 2024-02-08 DIAGNOSIS — I110 Hypertensive heart disease with heart failure: Secondary | ICD-10-CM | POA: Diagnosis not present

## 2024-02-08 DIAGNOSIS — Z7401 Bed confinement status: Secondary | ICD-10-CM | POA: Diagnosis not present

## 2024-02-08 DIAGNOSIS — R0989 Other specified symptoms and signs involving the circulatory and respiratory systems: Secondary | ICD-10-CM | POA: Diagnosis not present

## 2024-02-08 LAB — CBC
HCT: 20.9 % — ABNORMAL LOW (ref 36.0–46.0)
Hemoglobin: 6.4 g/dL — CL (ref 12.0–15.0)
MCH: 22.5 pg — ABNORMAL LOW (ref 26.0–34.0)
MCHC: 30.6 g/dL (ref 30.0–36.0)
MCV: 73.3 fL — ABNORMAL LOW (ref 80.0–100.0)
Platelets: 261 10*3/uL (ref 150–400)
RBC: 2.85 MIL/uL — ABNORMAL LOW (ref 3.87–5.11)
RDW: 17.2 % — ABNORMAL HIGH (ref 11.5–15.5)
WBC: 3.9 10*3/uL — ABNORMAL LOW (ref 4.0–10.5)
nRBC: 0 % (ref 0.0–0.2)

## 2024-02-08 LAB — BASIC METABOLIC PANEL WITH GFR
Anion gap: 10 (ref 5–15)
BUN: 18 mg/dL (ref 8–23)
CO2: 22 mmol/L (ref 22–32)
Calcium: 9.2 mg/dL (ref 8.9–10.3)
Chloride: 94 mmol/L — ABNORMAL LOW (ref 98–111)
Creatinine, Ser: 0.75 mg/dL (ref 0.44–1.00)
GFR, Estimated: >60 mL/min (ref >60)
Glucose, Bld: 124 mg/dL — ABNORMAL HIGH (ref 70–99)
Potassium: 4.2 mmol/L (ref 3.5–5.1)
Sodium: 126 mmol/L — ABNORMAL LOW (ref 135–145)

## 2024-02-08 LAB — TROPONIN I (HIGH SENSITIVITY): Troponin I (High Sensitivity): 31 ng/L — ABNORMAL HIGH (ref <18)

## 2024-02-08 MED ORDER — IPRATROPIUM-ALBUTEROL 0.5-2.5 (3) MG/3ML IN SOLN
RESPIRATORY_TRACT | Status: AC
  Administered 2024-02-08: 3 mL
  Filled 2024-02-08: qty 3

## 2024-02-08 NOTE — ED Notes (Signed)
 Pt placed on 3L [place] in triage due to low O2 saturation of 85 in the lobby.

## 2024-02-08 NOTE — ED Triage Notes (Signed)
 For 2 days she has had difficulty breathing  no pain  she does not wear 02 at home

## 2024-02-08 NOTE — ED Triage Notes (Signed)
 The pt has a lt ak amp

## 2024-02-09 ENCOUNTER — Encounter (HOSPITAL_COMMUNITY): Payer: Self-pay | Admitting: Family Medicine

## 2024-02-09 DIAGNOSIS — J9601 Acute respiratory failure with hypoxia: Secondary | ICD-10-CM

## 2024-02-09 DIAGNOSIS — D638 Anemia in other chronic diseases classified elsewhere: Secondary | ICD-10-CM | POA: Diagnosis present

## 2024-02-09 DIAGNOSIS — E119 Type 2 diabetes mellitus without complications: Secondary | ICD-10-CM

## 2024-02-09 DIAGNOSIS — Z89612 Acquired absence of left leg above knee: Secondary | ICD-10-CM | POA: Diagnosis not present

## 2024-02-09 DIAGNOSIS — Z7984 Long term (current) use of oral hypoglycemic drugs: Secondary | ICD-10-CM | POA: Diagnosis not present

## 2024-02-09 DIAGNOSIS — D500 Iron deficiency anemia secondary to blood loss (chronic): Secondary | ICD-10-CM | POA: Diagnosis present

## 2024-02-09 DIAGNOSIS — E871 Hypo-osmolality and hyponatremia: Secondary | ICD-10-CM

## 2024-02-09 DIAGNOSIS — D72819 Decreased white blood cell count, unspecified: Secondary | ICD-10-CM | POA: Diagnosis present

## 2024-02-09 DIAGNOSIS — D649 Anemia, unspecified: Secondary | ICD-10-CM | POA: Diagnosis not present

## 2024-02-09 DIAGNOSIS — I5023 Acute on chronic systolic (congestive) heart failure: Secondary | ICD-10-CM | POA: Diagnosis not present

## 2024-02-09 DIAGNOSIS — E1151 Type 2 diabetes mellitus with diabetic peripheral angiopathy without gangrene: Secondary | ICD-10-CM | POA: Diagnosis present

## 2024-02-09 DIAGNOSIS — Z993 Dependence on wheelchair: Secondary | ICD-10-CM | POA: Diagnosis not present

## 2024-02-09 DIAGNOSIS — Z7401 Bed confinement status: Secondary | ICD-10-CM | POA: Diagnosis not present

## 2024-02-09 DIAGNOSIS — I110 Hypertensive heart disease with heart failure: Secondary | ICD-10-CM | POA: Diagnosis present

## 2024-02-09 LAB — DIFFERENTIAL
Abs Immature Granulocytes: 0.01 10*3/uL (ref 0.00–0.07)
Basophils Absolute: 0 10*3/uL (ref 0.0–0.1)
Basophils Relative: 0 %
Eosinophils Absolute: 0.1 10*3/uL (ref 0.0–0.5)
Eosinophils Relative: 2 %
Immature Granulocytes: 0 %
Lymphocytes Relative: 26 %
Lymphs Abs: 1 10*3/uL (ref 0.7–4.0)
Monocytes Absolute: 0.3 10*3/uL (ref 0.1–1.0)
Monocytes Relative: 7 %
Neutro Abs: 2.5 10*3/uL (ref 1.7–7.7)
Neutrophils Relative %: 65 %

## 2024-02-09 LAB — CBC
HCT: 21.2 % — ABNORMAL LOW (ref 36.0–46.0)
HCT: 30.2 % — ABNORMAL LOW (ref 36.0–46.0)
Hemoglobin: 6.4 g/dL — CL (ref 12.0–15.0)
Hemoglobin: 10.2 g/dL — ABNORMAL LOW (ref 12.0–15.0)
MCH: 22.4 pg — ABNORMAL LOW (ref 26.0–34.0)
MCH: 24.9 pg — ABNORMAL LOW (ref 26.0–34.0)
MCHC: 30.2 g/dL (ref 30.0–36.0)
MCHC: 33.8 g/dL (ref 30.0–36.0)
MCV: 74.1 fL — ABNORMAL LOW (ref 80.0–100.0)
MCV: 73.7 fL — ABNORMAL LOW (ref 80.0–100.0)
Platelets: 295 10*3/uL (ref 150–400)
Platelets: 245 10*3/uL (ref 150–400)
RBC: 2.86 MIL/uL — ABNORMAL LOW (ref 3.87–5.11)
RBC: 4.1 MIL/uL (ref 3.87–5.11)
RDW: 17.2 % — ABNORMAL HIGH (ref 11.5–15.5)
RDW: 17 % — ABNORMAL HIGH (ref 11.5–15.5)
WBC: 3.8 10*3/uL — ABNORMAL LOW (ref 4.0–10.5)
WBC: 6.2 10*3/uL (ref 4.0–10.5)
nRBC: 0 % (ref 0.0–0.2)
nRBC: 0.3 % — ABNORMAL HIGH (ref 0.0–0.2)

## 2024-02-09 LAB — HEMOGLOBIN A1C
Hgb A1c MFr Bld: 7.2 % — ABNORMAL HIGH (ref 4.8–5.6)
Mean Plasma Glucose: 159.94 mg/dL

## 2024-02-09 LAB — BASIC METABOLIC PANEL WITH GFR
Anion gap: 10 (ref 5–15)
BUN: 19 mg/dL (ref 8–23)
CO2: 26 mmol/L (ref 22–32)
Calcium: 8.8 mg/dL — ABNORMAL LOW (ref 8.9–10.3)
Chloride: 89 mmol/L — ABNORMAL LOW (ref 98–111)
Creatinine, Ser: 0.79 mg/dL (ref 0.44–1.00)
GFR, Estimated: >60 mL/min (ref >60)
Glucose, Bld: 144 mg/dL — ABNORMAL HIGH (ref 70–99)
Potassium: 3.4 mmol/L — ABNORMAL LOW (ref 3.5–5.1)
Sodium: 125 mmol/L — ABNORMAL LOW (ref 135–145)

## 2024-02-09 LAB — HEPATIC FUNCTION PANEL
ALT: 23 U/L (ref 0–44)
AST: 23 U/L (ref 15–41)
Albumin: 3.3 g/dL — ABNORMAL LOW (ref 3.5–5.0)
Alkaline Phosphatase: 86 U/L (ref 38–126)
Bilirubin, Direct: <0.1 mg/dL (ref 0.0–0.2)
Total Bilirubin: 0.6 mg/dL (ref 0.0–1.2)
Total Protein: 7.3 g/dL (ref 6.5–8.1)

## 2024-02-09 LAB — IRON AND TIBC
Iron: 23 ug/dL — ABNORMAL LOW (ref 28–170)
Saturation Ratios: 4 % — ABNORMAL LOW (ref 10.4–31.8)
TIBC: 526 ug/dL — ABNORMAL HIGH (ref 250–450)
UIBC: 503 ug/dL

## 2024-02-09 LAB — GLUCOSE, CAPILLARY
Glucose-Capillary: 126 mg/dL — ABNORMAL HIGH (ref 70–99)
Glucose-Capillary: 143 mg/dL — ABNORMAL HIGH (ref 70–99)
Glucose-Capillary: 91 mg/dL (ref 70–99)
Glucose-Capillary: 136 mg/dL — ABNORMAL HIGH (ref 70–99)

## 2024-02-09 LAB — FERRITIN: Ferritin: 17 ng/mL (ref 11–307)

## 2024-02-09 LAB — MAGNESIUM: Magnesium: 1.8 mg/dL (ref 1.7–2.4)

## 2024-02-09 LAB — POC OCCULT BLOOD, ED: Fecal Occult Bld: NEGATIVE

## 2024-02-09 LAB — PREPARE RBC (CROSSMATCH)

## 2024-02-09 LAB — VITAMIN B12: Vitamin B-12: 1222 pg/mL — ABNORMAL HIGH (ref 180–914)

## 2024-02-09 LAB — BRAIN NATRIURETIC PEPTIDE: B Natriuretic Peptide: 2457.6 pg/mL — ABNORMAL HIGH (ref 0.0–100.0)

## 2024-02-09 LAB — TROPONIN I (HIGH SENSITIVITY): Troponin I (High Sensitivity): 29 ng/L — ABNORMAL HIGH (ref <18)

## 2024-02-09 LAB — RETICULOCYTES
Immature Retic Fract: 16.5 % — ABNORMAL HIGH (ref 2.3–15.9)
RBC.: 2.85 MIL/uL — ABNORMAL LOW (ref 3.87–5.11)
Retic Count, Absolute: 41.6 10*3/uL (ref 19.0–186.0)
Retic Ct Pct: 1.5 % (ref 0.4–3.1)

## 2024-02-09 LAB — HIV ANTIBODY (ROUTINE TESTING W REFLEX): HIV Screen 4th Generation wRfx: NONREACTIVE

## 2024-02-09 LAB — FOLATE: Folate: 18 ng/mL (ref >5.9)

## 2024-02-09 MED ORDER — ONDANSETRON HCL 4 MG/2ML IJ SOLN: 4.0000 mg | Freq: Four times a day (QID) | INTRAMUSCULAR | Status: DC | PRN

## 2024-02-09 MED ORDER — SODIUM CHLORIDE 0.9% IV SOLUTION: Freq: Once | INTRAVENOUS | Status: DC

## 2024-02-09 MED ORDER — SODIUM CHLORIDE 0.9% FLUSH
3.0000 mL | Freq: Two times a day (BID) | INTRAVENOUS | Status: DC
  Administered 2024-02-09 – 2024-02-11 (×6): 3 mL via INTRAVENOUS

## 2024-02-09 MED ORDER — FUROSEMIDE 10 MG/ML IJ SOLN
40.0000 mg | Freq: Once | INTRAMUSCULAR | Status: AC
  Administered 2024-02-09: 40 mg via INTRAVENOUS
  Filled 2024-02-09: qty 4

## 2024-02-09 MED ORDER — ONDANSETRON HCL 4 MG PO TABS
4.0000 mg | ORAL_TABLET | Freq: Four times a day (QID) | ORAL | Status: DC | PRN
Start: 2024-02-09 — End: 2024-02-12

## 2024-02-09 MED ORDER — MELATONIN 3 MG PO TABS
3.0000 mg | ORAL_TABLET | Freq: Every evening | ORAL | Status: DC | PRN
  Administered 2024-02-09: 3 mg via ORAL
  Filled 2024-02-09 (×3): qty 1

## 2024-02-09 MED ORDER — ACETAMINOPHEN 325 MG PO TABS
650.0000 mg | ORAL_TABLET | Freq: Four times a day (QID) | ORAL | Status: DC | PRN
  Administered 2024-02-10: 650 mg via ORAL
  Filled 2024-02-09 (×2): qty 2

## 2024-02-09 MED ORDER — ACETAMINOPHEN 650 MG RE SUPP: 650.0000 mg | Freq: Four times a day (QID) | RECTAL | Status: DC | PRN

## 2024-02-09 MED ORDER — FUROSEMIDE 10 MG/ML IJ SOLN
60.0000 mg | Freq: Two times a day (BID) | INTRAMUSCULAR | Status: DC
  Administered 2024-02-09 – 2024-02-12 (×7): 60 mg via INTRAVENOUS
  Filled 2024-02-09 (×7): qty 6

## 2024-02-09 MED ORDER — POLYETHYLENE GLYCOL 3350 17 G PO PACK
17.0000 g | PACK | Freq: Every day | ORAL | Status: DC
  Administered 2024-02-09 – 2024-02-11 (×2): 17 g via ORAL
  Filled 2024-02-09 (×2): qty 1

## 2024-02-09 MED ORDER — IRON SUCROSE 200 MG IVPB - SIMPLE MED
200.0000 mg | Status: DC
  Administered 2024-02-09 – 2024-02-11 (×3): 200 mg via INTRAVENOUS
  Filled 2024-02-09: qty 200
  Filled 2024-02-09: qty 110
  Filled 2024-02-09 (×2): qty 200

## 2024-02-09 MED ORDER — ROSUVASTATIN CALCIUM 20 MG PO TABS
20.0000 mg | ORAL_TABLET | Freq: Every day | ORAL | Status: DC
  Administered 2024-02-09 – 2024-02-10 (×2): 20 mg via ORAL
  Filled 2024-02-09 (×3): qty 1

## 2024-02-09 MED ORDER — INSULIN ASPART 100 UNIT/ML IJ SOLN
0.0000 [IU] | Freq: Three times a day (TID) | INTRAMUSCULAR | Status: DC
Start: 2024-02-09 — End: 2024-02-12
  Administered 2024-02-11: 1 [IU] via SUBCUTANEOUS

## 2024-02-09 MED ORDER — LOSARTAN POTASSIUM 50 MG PO TABS
50.0000 mg | ORAL_TABLET | Freq: Every day | ORAL | Status: DC
  Administered 2024-02-09: 50 mg via ORAL
  Filled 2024-02-09: qty 1

## 2024-02-09 MED ORDER — PANTOPRAZOLE SODIUM 40 MG PO TBEC
40.0000 mg | DELAYED_RELEASE_TABLET | Freq: Every day | ORAL | Status: DC
Start: 2024-02-09 — End: 2024-02-12
  Administered 2024-02-09 – 2024-02-11 (×3): 40 mg via ORAL
  Filled 2024-02-09 (×3): qty 1

## 2024-02-09 MED ORDER — DORZOLAMIDE HCL-TIMOLOL MAL 2-0.5 % OP SOLN
1.0000 [drp] | Freq: Two times a day (BID) | OPHTHALMIC | Status: DC
Start: 2024-02-09 — End: 2024-02-12
  Administered 2024-02-09 – 2024-02-11 (×6): 1 [drp] via OPHTHALMIC
  Filled 2024-02-09: qty 10

## 2024-02-09 MED ORDER — INSULIN ASPART 100 UNIT/ML IJ SOLN: 0.0000 [IU] | Freq: Every day | INTRAMUSCULAR | Status: DC

## 2024-02-09 MED ORDER — SENNOSIDES-DOCUSATE SODIUM 8.6-50 MG PO TABS: 1.0000 | ORAL_TABLET | Freq: Every evening | ORAL | Status: DC | PRN

## 2024-02-09 NOTE — Progress Notes (Signed)
 PROGRESS NOTE    Jill Munoz  WGN:[id number] DOB: 05-13-1951 DOA: 02/08/2024 PCP: Jearldine Mina, MD  72/F w/ type 2 diabetes mellitus, chronic HFmrEF, PAD, left AKA and chronic iron defi anemia who presents with shortness of breath and frothy sputum. - Worsening dyspnea for few days, noted to be hypoxic, in the ED tachypnea, hypoxia, hemoglobin 6.4, BNP 2458 Chest x-ray is concerning for CHF. -Transfused 2 units PRBC, started on IV Lasix  Subjective: -Continues to have some cough  Assessment and Plan:  Acute on chronic HFmrEF; Acute hypoxic respiratory failure  -Prior echo 6/23 with EF 40-45%, grade 1 DD, normal RV -Continue IV Lasix today, continue losartan  -Treatment of anemia as noted above -Follow-up repeat echo, slowly add GDMT as tolerated, poor candidate for SGLT2i  Severe iron deficiency anemia  Mild leukopenia  -Transfused 2 units of PRBC -Add IV iron -Discussed with patient and spouse, she has never had a GI workup in the past, they will discuss regarding this - With her AKA and overall debility may be prudent to have inpatient workup   Hyponatremia  - Serum sodium is 126 in setting of hypervolemia  - Monitor with diuresis   Type II DM  - A1c was 7.7% in November 2023  - CBGs are stable, sliding scale insulin   PAD, left AKA   DVT prophylaxis: SCDs Code Status: Full Code Family Communication: None present Disposition Plan: Home pending above workup  Consultants:    Procedures:   Antimicrobials:    Objective: Vitals:   02/09/24 0500 02/09/24 0518 02/09/24 0519 02/09/24 0741  BP: (!) 156/80 (!) 163/87 (!) 163/87 (!) 179/71  Pulse: 87 88 88 81  Resp: 17 (!) 24 (!) 24 (!) 22  Temp:  98 F (36.7 C) 98 F (36.7 C) 97.7 F (36.5 C)  TempSrc:  Oral Oral Oral  SpO2: 98% 100%  98%  Weight:  79.4 kg    Height:        Intake/Output Summary (Last 24 hours) at 02/09/2024 1040 Last data filed at 02/09/2024 0741 Gross per 24 hour  Intake 791.75 ml   Output 700 ml  Net 91.75 ml   Filed Weights   02/08/24 2232 02/09/24 0518  Weight: 46.3 kg 79.4 kg    Examination:  General exam: Chronically ill female appears older than stated age, AAO x 3 HEENT: Positive JVD CVS: S1-S2, regular rhythm Lungs: Bilateral Rales Abdomen: Soft, nontender, bowel sounds present Extremities: Trace edema, left AKA Psychiatry:  Mood & affect appropriate.     Data Reviewed:   CBC: Recent Labs  Lab 02/08/24 2255 02/09/24 0023  WBC 3.9* 3.8*  NEUTROABS  --  2.5  HGB 6.4* 6.4*  HCT 20.9* 21.2*  MCV 73.3* 74.1*  PLT 261 295   Basic Metabolic Panel: Recent Labs  Lab 02/08/24 2255  NA 126*  K 4.2  CL 94*  CO2 22  GLUCOSE 124*  BUN 18  CREATININE 0.75  CALCIUM  9.2   GFR: Estimated Creatinine Clearance: 62.4 mL/min (by C-G formula based on SCr of 0.75 mg/dL). Liver Function Tests: Recent Labs  Lab 02/09/24 0023  AST 23  ALT 23  ALKPHOS 86  BILITOT 0.6  PROT 7.3  ALBUMIN  3.3*   No results for input(s): "LIPASE", "AMYLASE" in the last 168 hours. No results for input(s): "AMMONIA" in the last 168 hours. Coagulation Profile: No results for input(s): "INR", "PROTIME" in the last 168 hours. Cardiac Enzymes: No results for input(s): "CKTOTAL", "CKMB", "CKMBINDEX", "TROPONINI" in the last  168 hours. BNP (last 3 results) No results for input(s): "PROBNP" in the last 8760 hours. HbA1C: No results for input(s): "HGBA1C" in the last 72 hours. CBG: Recent Labs  Lab 02/09/24 0746  GLUCAP 126*   Lipid Profile: No results for input(s): "CHOL", "HDL", "LDLCALC", "TRIG", "CHOLHDL", "LDLDIRECT" in the last 72 hours. Thyroid Function Tests: No results for input(s): "TSH", "T4TOTAL", "FREET4", "T3FREE", "THYROIDAB" in the last 72 hours. Anemia Panel: Recent Labs    02/08/24 2254 02/09/24 0023  FOLATE  --  18.0  FERRITIN  --  17  TIBC  --  526*  IRON  --  23*  RETICCTPCT 1.5  --    Urine analysis:    Component Value Date/Time    COLORURINE YELLOW 04/03/2022 1139   APPEARANCEUR HAZY (A) 04/03/2022 1139   LABSPEC 1.029 04/03/2022 1139   PHURINE 5.0 04/03/2022 1139   GLUCOSEU 150 (A) 04/03/2022 1139   HGBUR SMALL (A) 04/03/2022 1139   BILIRUBINUR NEGATIVE 04/03/2022 1139   KETONESUR NEGATIVE 04/03/2022 1139   PROTEINUR 100 (A) 04/03/2022 1139   NITRITE NEGATIVE 04/03/2022 1139   LEUKOCYTESUR NEGATIVE 04/03/2022 1139   Sepsis Labs: @LABRCNTIP (procalcitonin:4,lacticidven:4)  )No results found for this or any previous visit (from the past 240 hours).   Radiology Studies: DG Chest 2 View Result Date: 02/08/2024 CLINICAL DATA:  Difficulty breathing for 2 days EXAM: CHEST - 2 VIEW COMPARISON:  04/26/2022 FINDINGS: Frontal and lateral views of the chest demonstrate enlarged cardiac silhouette. Lung volumes are diminished, with pulmonary vascular congestion, diffuse interstitial prominence, and bibasilar consolidation and effusions. No pneumothorax. No acute bony abnormalities. IMPRESSION: 1. Low lung volumes, with constellation of findings suggesting superimposed congestive heart failure. Electronically Signed   By: Bobbye Burrow M.D.   On: 02/08/2024 23:48     Scheduled Meds:  sodium chloride    Intravenous Once   dorzolamide -timolol   1 drop Both Eyes BID   furosemide  60 mg Intravenous Q12H   insulin  aspart  0-5 Units Subcutaneous QHS   insulin  aspart  0-6 Units Subcutaneous TID WC   losartan   50 mg Oral QHS   pantoprazole   40 mg Oral Daily   rosuvastatin   20 mg Oral QHS   sodium chloride  flush  3 mL Intravenous Q12H   Continuous Infusions:  iron sucrose       LOS: 0 days    Time spent:    Deforest Fast, MD [facility]   02/09/2024, 10:40 AM

## 2024-02-09 NOTE — ED Provider Notes (Signed)
 [place] EMERGENCY DEPARTMENT AT [facility] Provider Note   CSN: [id number] Arrival date & time: 02/08/24  2216     History  Chief Complaint  Patient presents with   Shortness of Breath    Jill Munoz is a 73 y.o. female.  Patient with past medical history significant for CHF with preserved ejection fraction, type II DM, anemia of chronic disease, hypertension, left AKA presents the emergency room complaining of worsening shortness of breath over the past day.  Patient states she feels that she may be having allergies causing her shortness of breath.  She states it is worse at night than it is in the morning.  Patient denies any bright red blood per rectum or dark-colored stools.  Denies nausea, vomiting, chest pain, fever, cough.  At the time of arrival patient was noted to be hypoxic with a room air saturation of 85%.  She was placed on nasal cannula with supplemental oxygen.  She does not use supplemental oxygen at home.     Shortness of Breath      Home Medications Prior to Admission medications   Medication Sig Start Date End Date Taking? Authorizing Provider  Ascorbic Acid  (VITAMIN C) 1000 MG tablet Take 1,000 mg by mouth daily.    [provider]  Berberine Chloride 500 MG CAPS Take 1 capsule by mouth in the morning, at noon, and at bedtime.    [provider]  brimonidine  (ALPHAGAN ) 0.2 % ophthalmic solution Place 1 drop into both eyes 3 (three) times daily. 03/22/22   [provider]  dapagliflozin  propanediol (FARXIGA ) 10 MG TABS tablet TAKE 1 TABLET BY MOUTH DAILY BEFORE BREAKFAST. 12/03/22   Aida House, MD  dorzolamide -timolol  (COSOPT ) 22.3-6.8 MG/ML ophthalmic solution Place 1 drop into both eyes 2 (two) times daily. 04/14/22   Cathey Clunes, MD  glucose blood (ACCU-CHEK GUIDE) test strip Use as instructed once a day 09/18/22   Aida House, MD  losartan  (COZAAR ) 50 MG tablet TAKE 1 TABLET BY MOUTH DAILY AT 10PM  06/04/23   Tolia, Sunit, DO  melatonin 3 MG TABS tablet Take 3 mg by mouth at bedtime.    [provider]  pantoprazole  (PROTONIX ) 40 MG tablet Take 1 tablet (40 mg total) by mouth daily. 08/21/22   Aida House, MD  rosuvastatin  (CRESTOR ) 20 MG tablet Take 1 tablet (20 mg total) by mouth at bedtime. 05/14/23 08/12/23  Tolia, Sunit, DO  sennosides-docusate sodium  (SENOKOT-S) 8.6-50 MG tablet Take 1 tablet by mouth daily as needed for constipation. 05/11/22   Aida House, MD  spironolactone  (ALDACTONE ) 25 MG tablet Take 1 tablet (25 mg total) by mouth daily. 05/14/23 08/12/23  Tolia, Sunit, DO      Allergies    Patient has no known allergies.    Review of Systems   Review of Systems  Respiratory:  Positive for shortness of breath.     Physical Exam Updated Vital Signs BP (!) 164/91 (BP Location: Left Arm)   Pulse 85   Temp 97.6 F (36.4 C) (Oral)   Resp 20   Ht 5\' 1"  (1.549 m)   Wt 46.3 kg   SpO2 100%   BMI 19.29 kg/m  Physical Exam Vitals and nursing note reviewed. Exam conducted with a chaperone present.  Constitutional:      General: She is not in acute distress.    Appearance: She is well-developed.  HENT:     Head: Normocephalic and atraumatic.  Eyes:  Conjunctiva/sclera: Conjunctivae normal.  Cardiovascular:     Rate and Rhythm: Normal rate and regular rhythm.  Pulmonary:     Effort: Pulmonary effort is normal. No respiratory distress.     Breath sounds: Examination of the right-lower field reveals wheezing. Examination of the left-lower field reveals wheezing. Wheezing and rales present.  Chest:     Chest wall: No tenderness.  Abdominal:     Palpations: Abdomen is soft.     Tenderness: There is no abdominal tenderness.  Genitourinary:    Rectum: Normal. Guaiac result negative. No tenderness or external hemorrhoid.     Comments: Redness of bilateral inguinal region. Musculoskeletal:        General: No swelling.     Cervical back: Neck  supple.     Right lower leg: Edema present.     Comments: Left-sided AKA  Skin:    General: Skin is warm and dry.     Capillary Refill: Capillary refill takes less than 2 seconds.  Neurological:     Mental Status: She is alert.  Psychiatric:        Mood and Affect: Mood normal.     ED Results / Procedures / Treatments   Labs (all labs ordered are listed, but only abnormal results are displayed) Labs Reviewed  BASIC METABOLIC PANEL WITH GFR - Abnormal; Notable for the following components:      Result Value   Sodium 126 (*)    Chloride 94 (*)    Glucose, Bld 124 (*)    All other components within normal limits  CBC - Abnormal; Notable for the following components:   WBC 3.9 (*)    RBC 2.85 (*)    Hemoglobin 6.4 (*)    HCT 20.9 (*)    MCV 73.3 (*)    MCH 22.5 (*)    RDW 17.2 (*)    All other components within normal limits  BRAIN NATRIURETIC PEPTIDE - Abnormal; Notable for the following components:   B Natriuretic Peptide 2,457.6 (*)    All other components within normal limits  TROPONIN I (HIGH SENSITIVITY) - Abnormal; Notable for the following components:   Troponin I (High Sensitivity) 31 (*)    All other components within normal limits  TROPONIN I (HIGH SENSITIVITY) - Abnormal; Notable for the following components:   Troponin I (High Sensitivity) 29 (*)    All other components within normal limits  HEPATIC FUNCTION PANEL  VITAMIN B12  FOLATE  IRON AND TIBC  FERRITIN  RETICULOCYTES  HAPTOGLOBIN  DIFFERENTIAL  POC OCCULT BLOOD, ED  TYPE AND SCREEN  PREPARE RBC (CROSSMATCH)    EKG EKG Interpretation Date/Time:  Saturday February 08 2024 22:42:54 EDT Ventricular Rate:  82 PR Interval:  182 QRS Duration:  66 QT Interval:  378 QTC Calculation: 441 R Axis:   114  Text Interpretation: Normal sinus rhythm Right axis deviation Low voltage QRS Cannot rule out Anterior infarct , age undetermined Confirmed by Palumbo, April ([id number]) on 02/08/2024 11:22:08  PM  Radiology DG Chest 2 View Result Date: 02/08/2024 CLINICAL DATA:  Difficulty breathing for 2 days EXAM: CHEST - 2 VIEW COMPARISON:  04/26/2022 FINDINGS: Frontal and lateral views of the chest demonstrate enlarged cardiac silhouette. Lung volumes are diminished, with pulmonary vascular congestion, diffuse interstitial prominence, and bibasilar consolidation and effusions. No pneumothorax. No acute bony abnormalities. IMPRESSION: 1. Low lung volumes, with constellation of findings suggesting superimposed congestive heart failure. Electronically Signed   By: Bobbye Burrow M.D.   On: 02/08/2024  23:48    Procedures .Critical Care  Performed by: Elisa Guest, PA-C Authorized by: Elisa Guest, PA-C   Critical care provider statement:    Critical care time (minutes):  50   Critical care was necessary to treat or prevent imminent or life-threatening deterioration of the following conditions:  Cardiac failure, circulatory failure, respiratory failure and toxidrome (Fluid overload, patient requiring transfusion, new oxygen requirement)   Critical care was time spent personally by me on the following activities:  Development of treatment plan with patient or surrogate, discussions with consultants, evaluation of patient's response to treatment, examination of patient, ordering and review of laboratory studies, ordering and review of radiographic studies, ordering and performing treatments and interventions, pulse oximetry, re-evaluation of patient's condition and review of old charts   Care discussed with: admitting provider       Medications Ordered in ED Medications  0.9 %  sodium chloride  infusion (Manually program via Guardrails IV Fluids) (has no administration in time range)  ipratropium-albuterol (DUONEB) 0.5-2.5 (3) MG/3ML nebulizer solution (3 mLs  Given 02/08/24 2344)  furosemide (LASIX) injection 40 mg (40 mg Intravenous Given 02/09/24 0158)    ED Course/ Medical Decision  Making/ A&P                                 Medical Decision Making Amount and/or Complexity of Data Reviewed Labs: ordered.  Risk Prescription drug management. Decision regarding hospitalization.   This patient presents to the ED for concern of shortness of breath, this involves an extensive number of treatment options, and is a complaint that carries with it a high risk of complications and morbidity.  The differential diagnosis includes fluid overload, anemia, infection, pneumonia, others   Co morbidities that complicate the patient evaluation  CHF, type II DM, chronic anemia   Additional history obtained:  Additional history obtained from family at bedside External records from outside source obtained and reviewed including cardiology notes   Lab Tests:  I Ordered, and personally interpreted labs.  The pertinent results include: BNP 2457.6; troponins flat with initial read of 31, repeat 29 likely demand related to; hemoglobin 6.4; sodium 126   Imaging Studies ordered:  I ordered imaging studies including chest x-ray I independently visualized and interpreted imaging which showed  1. Low lung volumes, with constellation of findings suggesting  superimposed congestive heart failure.   I agree with the radiologist interpretation   Cardiac Monitoring: / EKG:  The patient was maintained on a cardiac monitor.  I personally viewed and interpreted the cardiac monitored which showed an underlying rhythm of: Normal sinus rhythm   Problem List / ED Course / Critical interventions / Medication management   I ordered medication including 2 units PRBC for anemia, DuoNeb for mild wheezing, Lasix for fluid overload Reevaluation of the patient after these medicines showed that the patient improved I have reviewed the patients home medicines and have made adjustments as needed   Consultations Obtained:  I requested consultation with the hospitalist, Dr.Opyd, and discussed  lab and imaging findings as well as pertinent plan - they recommend: admission    Test / Admission - Considered:  Patient with hypervolemic hyponatremia, symptomatic anemia. Patient being treated with PRBC and Lasix. Fecal occult test was negative. Patient also had mild wheezing resolved with a breathing treatment. Patient has a new oxygen requirement. Patient needs hospital admission for continued evaluation and managment.  Final Clinical Impression(s) / ED Diagnoses Final diagnoses:  Symptomatic anemia  Acute on chronic heart failure, unspecified heart failure type [facility])  Hyponatremia    Rx / DC Orders ED Discharge Orders     None         Delories Fetter 02/09/24 0227    Lindle Rhea, MD 02/09/24 [id number]

## 2024-02-09 NOTE — H&P (Signed)
 History and Physical    Jill Munoz ZOX:[id number] DOB: 11-01-1950 DOA: 02/08/2024  PCP: Jearldine Mina, MD   Patient coming from: Home   Chief Complaint: SOB, frothy sputum   HPI: Jill Munoz is a 73 y.o. female with medical history significant for type 2 diabetes mellitus, chronic HFmrEF, and chronic anemia who presents with shortness of breath and frothy sputum.  Patient developed worsening shortness of breath over the past day, has had a cough with frothy sputum, and was noted to have oxygen saturation in the low 80s at home.  She denies any chest pain, fever, chills, melena, hematochezia, or any other bleeding.  ED Course: Upon arrival to the ED, patient is found to be afebrile and saturating 85% on room air with mild tachypnea, normal HR, and stable BP.  Labs are most notable for sodium 126, WBC 3900, hemoglobin 6.4, and BNP 2458.  Chest x-ray is concerning for CHF.  2 units RBC were ordered for transfusion and the patient was given 40 mg IV Lasix in the ED.  Review of Systems:  All other systems reviewed and apart from HPI, are negative.  Past Medical History:  Diagnosis Date   Anemia    Diabetes mellitus without complication (HCC)    Pre-diabetes     Past Surgical History:  Procedure Laterality Date   AMPUTATION  04/06/2022   Procedure: AMPUTATION ABOVE KNEE REVISION;  Surgeon: Skylin Kennerson Ford, MD;  Location: [facility] OR;  Service: Orthopedics;;   I & D EXTREMITY Left 04/02/2022   Procedure: IRRIGATION AND DEBRIDEMENT LEFT LOWER LEG APPLICATION OF WOUND VAC ;  Surgeon: Bettyjane Brunet, MD;  Location: [facility] OR;  Service: Orthopedics;  Laterality: Left;   I & D EXTREMITY Left 04/04/2022   Procedure: LEFT ABOVE KNEE AMPUTATION;  Surgeon: Jaevon Paras Ford, MD;  Location: [facility] OR;  Service: Orthopedics;  Laterality: Left;   I & D EXTREMITY Left 04/06/2022   Procedure: LEFT LEG DEBRIDEMENT;  Surgeon: Traeger Sultana Ford, MD;  Location: [facility] OR;  Service: Orthopedics;  Laterality: Left;   STUMP  REVISION Left 04/11/2022   Procedure: REVISION LEFT ABOVE KNEE AMPUTATION;  Surgeon: Umi Mainor Ford, MD;  Location: [facility] OR;  Service: Orthopedics;  Laterality: Left;    Social History:   reports that she has never smoked. She has never used smokeless tobacco. She reports that she does not drink alcohol and does not use drugs.  No Known Allergies  Family History  Problem Relation Age of Onset   Heart attack Mother    Heart disease Mother      Prior to Admission medications   Medication Sig Start Date End Date Taking? Authorizing Provider  Ascorbic Acid  (VITAMIN C) 1000 MG tablet Take 1,000 mg by mouth daily.    [provider]  Berberine Chloride 500 MG CAPS Take 1 capsule by mouth in the morning, at noon, and at bedtime.    [provider]  brimonidine  (ALPHAGAN ) 0.2 % ophthalmic solution Place 1 drop into both eyes 3 (three) times daily. 03/22/22   [provider]  dapagliflozin  propanediol (FARXIGA ) 10 MG TABS tablet TAKE 1 TABLET BY MOUTH DAILY BEFORE BREAKFAST. 12/03/22   Aida House, MD  dorzolamide -timolol  (COSOPT ) 22.3-6.8 MG/ML ophthalmic solution Place 1 drop into both eyes 2 (two) times daily. 04/14/22   Cathey Clunes, MD  glucose blood (ACCU-CHEK GUIDE) test strip Use as instructed once a day 09/18/22   Aida House, MD  losartan  (COZAAR ) 50 MG tablet TAKE 1 TABLET  BY MOUTH DAILY AT 10PM 06/04/23   Tolia, Sunit, DO  melatonin 3 MG TABS tablet Take 3 mg by mouth at bedtime.    [provider]  pantoprazole  (PROTONIX ) 40 MG tablet Take 1 tablet (40 mg total) by mouth daily. 08/21/22   Aida House, MD  rosuvastatin  (CRESTOR ) 20 MG tablet Take 1 tablet (20 mg total) by mouth at bedtime. 05/14/23 08/12/23  Tolia, Sunit, DO  sennosides-docusate sodium  (SENOKOT-S) 8.6-50 MG tablet Take 1 tablet by mouth daily as needed for constipation. 05/11/22   Aida House, MD  spironolactone  (ALDACTONE ) 25 MG tablet Take 1 tablet (25 mg  total) by mouth daily. 05/14/23 08/12/23  Olinda Bertrand, DO    Physical Exam: Vitals:   02/09/24 0215 02/09/24 0230 02/09/24 0245 02/09/24 0300  BP: (!) 164/91 (!) 162/85 (!) 154/80 (!) 163/97  Pulse: 85 84 86 86  Resp: 20 (!) 21 20 (!) 21  Temp: 97.6 F (36.4 C)   (!) 97.5 F (36.4 C)  TempSrc: Oral   Oral  SpO2: 100% 100% 100% 100%  Weight:      Height:         Constitutional: NAD, calm  Eyes: PERTLA, lids and conjunctivae normal ENMT: Mucous membranes are moist. Posterior pharynx clear of any exudate or lesions.   Neck: supple, no masses  Respiratory: Dyspneic with speech. Labored respirations. Diffuse rales bilaterally.  Cardiovascular: S1 & S2 heard, regular rate and rhythm. Pretibial pitting edema.  Abdomen: No tenderness, soft. Bowel sounds active.  Musculoskeletal: no clubbing / cyanosis. S/p left AKA .   Skin: no significant rashes, lesions, ulcers. Warm, dry, well-perfused. Neurologic: CN 2-12 grossly intact. Moving all extremities. Alert and oriented.  Psychiatric: Pleasant. Cooperative.    Labs and Imaging on Admission: I have personally reviewed following labs and imaging studies  CBC: Recent Labs  Lab 02/08/24 2255 02/09/24 0023  WBC 3.9* 3.8*  NEUTROABS  --  2.5  HGB 6.4* 6.4*  HCT 20.9* 21.2*  MCV 73.3* 74.1*  PLT 261 295   Basic Metabolic Panel: Recent Labs  Lab 02/08/24 2255  NA 126*  K 4.2  CL 94*  CO2 22  GLUCOSE 124*  BUN 18  CREATININE 0.75  CALCIUM  9.2   GFR: Estimated Creatinine Clearance: 47.8 mL/min (by C-G formula based on SCr of 0.75 mg/dL). Liver Function Tests: Recent Labs  Lab 02/09/24 0023  AST 23  ALT 23  ALKPHOS 86  BILITOT 0.6  PROT 7.3  ALBUMIN  3.3*   No results for input(s): "LIPASE", "AMYLASE" in the last 168 hours. No results for input(s): "AMMONIA" in the last 168 hours. Coagulation Profile: No results for input(s): "INR", "PROTIME" in the last 168 hours. Cardiac Enzymes: No results for input(s):  "CKTOTAL", "CKMB", "CKMBINDEX", "TROPONINI" in the last 168 hours. BNP (last 3 results) No results for input(s): "PROBNP" in the last 8760 hours. HbA1C: No results for input(s): "HGBA1C" in the last 72 hours. CBG: No results for input(s): "GLUCAP" in the last 168 hours. Lipid Profile: No results for input(s): "CHOL", "HDL", "LDLCALC", "TRIG", "CHOLHDL", "LDLDIRECT" in the last 72 hours. Thyroid Function Tests: No results for input(s): "TSH", "T4TOTAL", "FREET4", "T3FREE", "THYROIDAB" in the last 72 hours. Anemia Panel: Recent Labs    02/08/24 2254 02/09/24 0023  FERRITIN  --  17  TIBC  --  526*  IRON  --  23*  RETICCTPCT 1.5  --    Urine analysis:    Component Value Date/Time   COLORURINE YELLOW  04/03/2022 1139   APPEARANCEUR HAZY (A) 04/03/2022 1139   LABSPEC 1.029 04/03/2022 1139   PHURINE 5.0 04/03/2022 1139   GLUCOSEU 150 (A) 04/03/2022 1139   HGBUR SMALL (A) 04/03/2022 1139   BILIRUBINUR NEGATIVE 04/03/2022 1139   KETONESUR NEGATIVE 04/03/2022 1139   PROTEINUR 100 (A) 04/03/2022 1139   NITRITE NEGATIVE 04/03/2022 1139   LEUKOCYTESUR NEGATIVE 04/03/2022 1139   Sepsis Labs: @LABRCNTIP (procalcitonin:4,lacticidven:4) )No results found for this or any previous visit (from the past 240 hours).   Radiological Exams on Admission: DG Chest 2 View Result Date: 02/08/2024 CLINICAL DATA:  Difficulty breathing for 2 days EXAM: CHEST - 2 VIEW COMPARISON:  04/26/2022 FINDINGS: Frontal and lateral views of the chest demonstrate enlarged cardiac silhouette. Lung volumes are diminished, with pulmonary vascular congestion, diffuse interstitial prominence, and bibasilar consolidation and effusions. No pneumothorax. No acute bony abnormalities. IMPRESSION: 1. Low lung volumes, with constellation of findings suggesting superimposed congestive heart failure. Electronically Signed   By: Bobbye Burrow M.D.   On: 02/08/2024 23:48    EKG: Independently reviewed. Sinus rhythm, RAD.    Assessment/Plan   1. Acute on chronic HFmrEF; acute hypoxic respiratory failure  - Diurese with IV Lasix, monitor weight and I/Os, continue losartan , update echo  2. Acute on chronic anemia; leukopenia  - WBC is 3,900 on admission and Hgb 6.4, down from 8.5 in March 2024  - She denies melena or hematochezia and FOBT is negative in the ED  - Check differential and anemia panel, repeat CBC following transfusion    3. Hyponatremia  - Serum sodium is 126 in setting of hypervolemia  - Monitor closely while diuresing    4. Type II DM  - A1c was 7.7% in November 2023  - Check CBGs and use low-intensity SSI for now     DVT prophylaxis: SCD Code Status: Full  Level of Care: Level of care: Progressive Family Communication: Husband at bedside  Disposition Plan:  Patient is from: Home  Anticipated d/c is to: Home  Anticipated d/c date is: 02/12/24  Patient currently: Pending improved volume and respiratory status, stable H&H, stable electrolytes   Consults called: None  Admission status: Inpatient     Walton Guppy, MD [facility]  02/09/2024, 3:14 AM

## 2024-02-09 NOTE — ED Notes (Signed)
 PRBC completed no s/s of adverse reaction no complaints at this time. VSS safety and comfort maintained.

## 2024-02-09 NOTE — ED Notes (Addendum)
 PRBC verified at patient bedside via 2 RN. Patient a/o no acute distress noted vss cardiac monitoring in progress. Spo2 100% on 2L [place]. PRBC infusing via RAC#20 @ 71ml/hr no s/s of adverse reaction at this time. Safety and comfort maintained family at bedside will monitor.

## 2024-02-09 NOTE — ED Notes (Signed)
 VSS no acute distress noted no adverse reaction noted PRBC increased to 150ml/hr

## 2024-02-10 ENCOUNTER — Inpatient Hospital Stay (HOSPITAL_COMMUNITY)

## 2024-02-10 DIAGNOSIS — I5023 Acute on chronic systolic (congestive) heart failure: Secondary | ICD-10-CM | POA: Diagnosis not present

## 2024-02-10 LAB — BPAM RBC
Blood Product Expiration Date: 202505142359
Blood Product Expiration Date: 202505212359
ISSUE DATE / TIME: 202504270145
ISSUE DATE / TIME: 202504270449
Unit Type and Rh: 5100
Unit Type and Rh: 5100

## 2024-02-10 LAB — ECHOCARDIOGRAM COMPLETE
Area-P 1/2: 4.15 cm2
Calc EF: 41.2 %
Height: 61 in
MV VTI: 2.32 cm2
S' Lateral: 3.1 cm
Single Plane A2C EF: 40.8 %
Single Plane A4C EF: 40.6 %
Weight: 2800 [oz_av]

## 2024-02-10 LAB — GLUCOSE, CAPILLARY
Glucose-Capillary: 68 mg/dL — ABNORMAL LOW (ref 70–99)
Glucose-Capillary: 98 mg/dL (ref 70–99)
Glucose-Capillary: 95 mg/dL (ref 70–99)
Glucose-Capillary: 77 mg/dL (ref 70–99)
Glucose-Capillary: 122 mg/dL — ABNORMAL HIGH (ref 70–99)

## 2024-02-10 LAB — CBC
HCT: 28.9 % — ABNORMAL LOW (ref 36.0–46.0)
Hemoglobin: 9.6 g/dL — ABNORMAL LOW (ref 12.0–15.0)
MCH: 24.6 pg — ABNORMAL LOW (ref 26.0–34.0)
MCHC: 33.2 g/dL (ref 30.0–36.0)
MCV: 73.9 fL — ABNORMAL LOW (ref 80.0–100.0)
Platelets: 239 10*3/uL (ref 150–400)
RBC: 3.91 MIL/uL (ref 3.87–5.11)
RDW: 17.2 % — ABNORMAL HIGH (ref 11.5–15.5)
WBC: 6.3 10*3/uL (ref 4.0–10.5)
nRBC: 0.3 % — ABNORMAL HIGH (ref 0.0–0.2)

## 2024-02-10 LAB — TYPE AND SCREEN
ABO/RH(D): O POS
Antibody Screen: NEGATIVE
Unit division: 0
Unit division: 0

## 2024-02-10 LAB — BASIC METABOLIC PANEL WITH GFR
Anion gap: 12 (ref 5–15)
BUN: 17 mg/dL (ref 8–23)
CO2: 27 mmol/L (ref 22–32)
Calcium: 8.6 mg/dL — ABNORMAL LOW (ref 8.9–10.3)
Chloride: 86 mmol/L — ABNORMAL LOW (ref 98–111)
Creatinine, Ser: 0.98 mg/dL (ref 0.44–1.00)
GFR, Estimated: >60 mL/min (ref >60)
Glucose, Bld: 71 mg/dL (ref 70–99)
Potassium: 3.2 mmol/L — ABNORMAL LOW (ref 3.5–5.1)
Sodium: 125 mmol/L — ABNORMAL LOW (ref 135–145)

## 2024-02-10 MED ORDER — PERFLUTREN LIPID MICROSPHERE
1.0000 mL | INTRAVENOUS | Status: AC | PRN
Start: 2024-02-10 — End: 2024-02-10
  Administered 2024-02-10: 2 mL via INTRAVENOUS

## 2024-02-10 MED ORDER — IPRATROPIUM-ALBUTEROL 0.5-2.5 (3) MG/3ML IN SOLN
3.0000 mL | Freq: Two times a day (BID) | RESPIRATORY_TRACT | Status: DC
  Administered 2024-02-10 – 2024-02-12 (×5): 3 mL via RESPIRATORY_TRACT
  Filled 2024-02-10 (×4): qty 3

## 2024-02-10 MED ORDER — POTASSIUM CHLORIDE CRYS ER 20 MEQ PO TBCR
40.0000 meq | EXTENDED_RELEASE_TABLET | Freq: Two times a day (BID) | ORAL | Status: AC
  Administered 2024-02-10 (×2): 40 meq via ORAL
  Filled 2024-02-10 (×2): qty 2

## 2024-02-10 MED ORDER — SENNOSIDES-DOCUSATE SODIUM 8.6-50 MG PO TABS
1.0000 | ORAL_TABLET | Freq: Two times a day (BID) | ORAL | Status: DC
  Administered 2024-02-10 – 2024-02-11 (×2): 1 via ORAL
  Filled 2024-02-10 (×3): qty 1

## 2024-02-10 MED ORDER — LOSARTAN POTASSIUM 25 MG PO TABS
25.0000 mg | ORAL_TABLET | Freq: Every day | ORAL | Status: DC
  Administered 2024-02-10: 25 mg via ORAL
  Filled 2024-02-10 (×2): qty 1

## 2024-02-10 NOTE — Progress Notes (Signed)
  Echocardiogram 2D Echocardiogram has been performed.  Jill Munoz 02/10/2024, 3:46 PM

## 2024-02-10 NOTE — Progress Notes (Signed)
 Patient was very anxious and excited. Pt would scream out when her husband went to the restroom or when he slept. Melatonin did not help as patient stayed up all night. Will pass on to day shift that patient might do well with something for anxiety.

## 2024-02-10 NOTE — Progress Notes (Signed)
 PROGRESS NOTE    Jill Munoz  ZOX:[id number] DOB: [birth year] DOA: 02/08/2024 PCP: Jearldine Mina, MD  72/F w/ type 2 diabetes mellitus, chronic HFmrEF, PAD, left AKA and chronic iron defi anemia who presents with shortness of breath and frothy sputum. - Worsening dyspnea for few days, noted to be hypoxic, in the ED tachypnea, hypoxia, hemoglobin 6.4, BNP 2458 Chest x-ray is concerning for CHF. -Transfused 2 units PRBC, started on IV Lasix  Subjective: - Feels better overall, breathing is improving  Assessment and Plan:  Acute on chronic HFmrEF; Acute hypoxic respiratory failure  -Prior echo 6/23 with EF 40-45%, grade 1 DD, normal RV - Still volume overloaded, 2.2 L negative, continue IV Lasix today, continue losartan   -Treatment of anemia as noted above -Follow-up repeat echo, slowly add GDMT as tolerated, poor candidate for SGLT2i - She is wheelchair-bound on account of AKA  Severe iron deficiency anemia  Mild leukopenia  -Transfused 2 units of PRBC -Continue IV iron -Suspect chronic blood loss anemia, Hemoccult negative at this time -Discussed with patient and spouse, regarding GI workup, they are now open to consider this, discussed with [facility], Dr.Karki arrange colonoscopy and or endoscopy as outpatient   Hyponatremia  - Serum sodium is 125 in setting of hypervolemia  - Monitor with diuresis   Type II DM  - A1c was 7.7% in November 2023  - CBGs are stable, sliding scale insulin   PAD, left AKA   DVT prophylaxis: SCDs Code Status: Full Code Family Communication: Spouse at bedside Disposition Plan: Home pending above workup    Objective: Vitals:   02/09/24 1710 02/09/24 1920 02/10/24 0325 02/10/24 0724  BP: (!) 149/76 130/68 (!) 123/51 (!) 123/57  Pulse: 78 79 71 72  Resp: 17 18 18 18   Temp:  97.8 F (36.6 C) 97.8 F (36.6 C) 98 F (36.7 C)  TempSrc:  Oral Oral Oral  SpO2: 91% 91% 93% 90%  Weight:      Height:        Intake/Output Summary (Last 24  hours) at 02/10/2024 8119 Last data filed at 02/10/2024 0300 Gross per 24 hour  Intake 110 ml  Output 2400 ml  Net -2290 ml   Filed Weights   02/08/24 2232 02/09/24 0518  Weight: 46.3 kg 79.4 kg    Examination:  General exam: Chronically ill female appears older than stated age, AAO x 3 HEENT: Positive JVD CVS: S1-S2, regular rhythm Lungs: Bilateral Rales Abdomen: Soft, nontender, bowel sounds present Extremities: Trace edema, left AKA Psychiatry:  Mood & affect appropriate.     Data Reviewed:   CBC: Recent Labs  Lab 02/08/24 2255 02/09/24 0023 02/09/24 1127 02/10/24 0454  WBC 3.9* 3.8* 6.2 6.3  NEUTROABS  --  2.5  --   --   HGB 6.4* 6.4* 10.2* 9.6*  HCT 20.9* 21.2* 30.2* 28.9*  MCV 73.3* 74.1* 73.7* 73.9*  PLT 261 295 245 239   Basic Metabolic Panel: Recent Labs  Lab 02/08/24 2255 02/09/24 1127 02/10/24 0454  NA 126* 125* 125*  K 4.2 3.4* 3.2*  CL 94* 89* 86*  CO2 22 26 27   GLUCOSE 124* 144* 71  BUN 18 19 17   CREATININE 0.75 0.79 0.98  CALCIUM  9.2 8.8* 8.6*  MG  --  1.8  --    GFR: Estimated Creatinine Clearance: 50.9 mL/min (by C-G formula based on SCr of 0.98 mg/dL). Liver Function Tests: Recent Labs  Lab 02/09/24 0023  AST 23  ALT 23  ALKPHOS 86  BILITOT 0.6  PROT 7.3  ALBUMIN  3.3*   No results for input(s): "LIPASE", "AMYLASE" in the last 168 hours. No results for input(s): "AMMONIA" in the last 168 hours. Coagulation Profile: No results for input(s): "INR", "PROTIME" in the last 168 hours. Cardiac Enzymes: No results for input(s): "CKTOTAL", "CKMB", "CKMBINDEX", "TROPONINI" in the last 168 hours. BNP (last 3 results) No results for input(s): "PROBNP" in the last 8760 hours. HbA1C: Recent Labs    02/09/24 1127  HGBA1C 7.2*   CBG: Recent Labs  Lab 02/09/24 1137 02/09/24 1626 02/09/24 2221 02/10/24 0721 02/10/24 0837  GLUCAP 143* 91 136* 68* 98   Lipid Profile: No results for input(s): "CHOL", "HDL", "LDLCALC", "TRIG",  "CHOLHDL", "LDLDIRECT" in the last 72 hours. Thyroid Function Tests: No results for input(s): "TSH", "T4TOTAL", "FREET4", "T3FREE", "THYROIDAB" in the last 72 hours. Anemia Panel: Recent Labs    02/08/24 2254 02/09/24 0023 02/09/24 1127  VITAMINB12  --   --  1,222*  FOLATE  --  18.0  --   FERRITIN  --  17  --   TIBC  --  526*  --   IRON  --  23*  --   RETICCTPCT 1.5  --   --    Urine analysis:    Component Value Date/Time   COLORURINE YELLOW 04/03/2022 1139   APPEARANCEUR HAZY (A) 04/03/2022 1139   LABSPEC 1.029 04/03/2022 1139   PHURINE 5.0 04/03/2022 1139   GLUCOSEU 150 (A) 04/03/2022 1139   HGBUR SMALL (A) 04/03/2022 1139   BILIRUBINUR NEGATIVE 04/03/2022 1139   KETONESUR NEGATIVE 04/03/2022 1139   PROTEINUR 100 (A) 04/03/2022 1139   NITRITE NEGATIVE 04/03/2022 1139   LEUKOCYTESUR NEGATIVE 04/03/2022 1139   Sepsis Labs: @LABRCNTIP (procalcitonin:4,lacticidven:4)  )No results found for this or any previous visit (from the past 240 hours).   Radiology Studies: DG Chest 2 View Result Date: 02/08/2024 CLINICAL DATA:  Difficulty breathing for 2 days EXAM: CHEST - 2 VIEW COMPARISON:  04/26/2022 FINDINGS: Frontal and lateral views of the chest demonstrate enlarged cardiac silhouette. Lung volumes are diminished, with pulmonary vascular congestion, diffuse interstitial prominence, and bibasilar consolidation and effusions. No pneumothorax. No acute bony abnormalities. IMPRESSION: 1. Low lung volumes, with constellation of findings suggesting superimposed congestive heart failure. Electronically Signed   By: Bobbye Burrow M.D.   On: 02/08/2024 23:48     Scheduled Meds:  sodium chloride    Intravenous Once   dorzolamide -timolol   1 drop Both Eyes BID   furosemide  60 mg Intravenous Q12H   insulin  aspart  0-5 Units Subcutaneous QHS   insulin  aspart  0-6 Units Subcutaneous TID WC   ipratropium-albuterol  3 mL Nebulization BID   losartan   50 mg Oral QHS   pantoprazole   40 mg  Oral Daily   polyethylene glycol  17 g Oral Daily   potassium chloride   40 mEq Oral BID   rosuvastatin   20 mg Oral QHS   sodium chloride  flush  3 mL Intravenous Q12H   Continuous Infusions:  iron sucrose Stopped (02/09/24 1900)     LOS: 1 day    Time spent:    Deforest Fast, MD [facility]   02/10/2024, 9:26 AM

## 2024-02-10 NOTE — Progress Notes (Signed)
 Heart Failure Navigator Progress Note  Assessed for Heart & Vascular TOC clinic readiness.  Patient does not meet criteria due to has a scheduled [facility] appointment on 03/02/2024, No HF TOC per Dr. Drexel Gentles. .   Navigator will sign off at this time.   Randie Bustle, BSN, Scientist, clinical (histocompatibility and immunogenetics) Only

## 2024-02-10 NOTE — Plan of Care (Signed)
  Problem: Education: Goal: Ability to describe self-care measures that may prevent or decrease complications (Diabetes Survival Skills Education) will improve Outcome: Progressing Goal: Individualized Educational Video(s) Outcome: Progressing   Problem: Coping: Goal: Ability to adjust to condition or change in health will improve Outcome: Progressing   Problem: Fluid Volume: Goal: Ability to maintain a balanced intake and output will improve Outcome: Progressing   Problem: Health Behavior/Discharge Planning: Goal: Ability to identify and utilize available resources and services will improve Outcome: Progressing Goal: Ability to manage health-related needs will improve Outcome: Progressing   Problem: Metabolic: Goal: Ability to maintain appropriate glucose levels will improve Outcome: Progressing   Problem: Nutritional: Goal: Maintenance of adequate nutrition will improve Outcome: Progressing Goal: Progress toward achieving an optimal weight will improve Outcome: Progressing   Problem: Skin Integrity: Goal: Risk for impaired skin integrity will decrease Outcome: Progressing   Problem: Tissue Perfusion: Goal: Adequacy of tissue perfusion will improve Outcome: Progressing   Problem: Education: Goal: Knowledge of General Education information will improve Description: Including pain rating scale, medication(s)/side effects and non-pharmacologic comfort measures Outcome: Progressing   Problem: Health Behavior/Discharge Planning: Goal: Ability to manage health-related needs will improve Outcome: Progressing   Problem: Clinical Measurements: Goal: Ability to maintain clinical measurements within normal limits will improve Outcome: Progressing   Problem: Clinical Measurements: Goal: Ability to maintain clinical measurements within normal limits will improve Outcome: Progressing Goal: Will remain free from infection Outcome: Progressing Goal: Diagnostic test results  will improve Outcome: Progressing Goal: Respiratory complications will improve Outcome: Progressing Goal: Cardiovascular complication will be avoided Outcome: Progressing

## 2024-02-11 DIAGNOSIS — I5023 Acute on chronic systolic (congestive) heart failure: Secondary | ICD-10-CM | POA: Diagnosis not present

## 2024-02-11 LAB — HAPTOGLOBIN: Haptoglobin: 156 mg/dL (ref 37–355)

## 2024-02-11 LAB — BASIC METABOLIC PANEL WITH GFR
Anion gap: 11 (ref 5–15)
BUN: 16 mg/dL (ref 8–23)
CO2: 31 mmol/L (ref 22–32)
Calcium: 9.3 mg/dL (ref 8.9–10.3)
Chloride: 89 mmol/L — ABNORMAL LOW (ref 98–111)
Creatinine, Ser: 1.09 mg/dL — ABNORMAL HIGH (ref 0.44–1.00)
GFR, Estimated: 55 mL/min — ABNORMAL LOW (ref >60)
Glucose, Bld: 83 mg/dL (ref 70–99)
Potassium: 4 mmol/L (ref 3.5–5.1)
Sodium: 131 mmol/L — ABNORMAL LOW (ref 135–145)

## 2024-02-11 LAB — CBC
HCT: 32.9 % — ABNORMAL LOW (ref 36.0–46.0)
Hemoglobin: 10.9 g/dL — ABNORMAL LOW (ref 12.0–15.0)
MCH: 24.4 pg — ABNORMAL LOW (ref 26.0–34.0)
MCHC: 33.1 g/dL (ref 30.0–36.0)
MCV: 73.6 fL — ABNORMAL LOW (ref 80.0–100.0)
Platelets: 270 10*3/uL (ref 150–400)
RBC: 4.47 MIL/uL (ref 3.87–5.11)
RDW: 17.8 % — ABNORMAL HIGH (ref 11.5–15.5)
WBC: 7.5 10*3/uL (ref 4.0–10.5)
nRBC: 0.4 % — ABNORMAL HIGH (ref 0.0–0.2)

## 2024-02-11 LAB — GLUCOSE, CAPILLARY
Glucose-Capillary: 97 mg/dL (ref 70–99)
Glucose-Capillary: 163 mg/dL — ABNORMAL HIGH (ref 70–99)
Glucose-Capillary: 142 mg/dL — ABNORMAL HIGH (ref 70–99)
Glucose-Capillary: 209 mg/dL — ABNORMAL HIGH (ref 70–99)

## 2024-02-11 MED ORDER — POLYVINYL ALCOHOL 1.4 % OP SOLN
2.0000 [drp] | OPHTHALMIC | Status: DC | PRN
  Administered 2024-02-11: 2 [drp] via OPHTHALMIC
  Filled 2024-02-11: qty 15

## 2024-02-11 MED ORDER — POTASSIUM CHLORIDE CRYS ER 20 MEQ PO TBCR
40.0000 meq | EXTENDED_RELEASE_TABLET | Freq: Once | ORAL | Status: AC
  Administered 2024-02-11: 40 meq via ORAL
  Filled 2024-02-11: qty 2

## 2024-02-11 NOTE — Progress Notes (Signed)
 PROGRESS NOTE    Jill Munoz  WUJ:[id number] DOB: Sep 27, 1951 DOA: 02/08/2024 PCP: Jill Mina, MD  72/F w/ type 2 diabetes mellitus, chronic HFmrEF, PAD, left AKA and chronic iron defi anemia who presents with shortness of breath and frothy sputum. - Worsening dyspnea for few days, noted to be hypoxic, in the ED tachypnea, hypoxia, hemoglobin 6.4, BNP 2458 Chest x-ray is concerning for CHF. -Transfused 2 units PRBC, started on IV Lasix  Subjective: - Feels better overall, breathing continues to improve, down to 1 L O2  Assessment and Plan:  Acute on chronic HFmrEF; Acute hypoxic respiratory failure  -Prior echo 6/23 with EF 40-45%, grade 1 DD, normal RV - Volume status improving, 4.9 L negative, continue IV Lasix 1 more day, continue losartan  -Repeat echo largely unchanged, EF 40-45%, normal RV - Poor candidate for SGLT2i - She is wheelchair-bound, w/ AKA  Severe iron deficiency anemia  Mild leukopenia  -Transfused 2 units of PRBC -Continue IV iron -Suspect chronic blood loss anemia, Hemoccult negative at this time -Discussed with patient and spouse, regarding Munoz workup, they are now open to consider this, discussed with Jill Munoz, Jill Munoz arrange colonoscopy and or endoscopy as outpatient in the next few weeks   Hyponatremia  - Serum sodium is 125 in setting of hypervolemia  - Monitor with diuresis   Type II DM  - A1c was 7.7% in November 2023  - CBGs are stable, sliding scale insulin   PAD, left AKA   DVT prophylaxis: SCDs Code Status: Full Code Family Communication: Spouse at bedside Disposition Plan: Home pending above workup, likely tomorrow    Objective: Vitals:   02/11/24 0408 02/11/24 0745 02/11/24 0816 02/11/24 1133  BP: 133/61 (!) 124/56  (!) 125/52  Pulse: 79 76  76  Resp: 16 16  16   Temp: (!) 97.2 F (36.2 C) (!) 97.4 F (36.3 C)  98.9 F (37.2 C)  TempSrc: Oral Oral  Oral  SpO2: 94% 95% 92% 93%  Weight: 50.8 kg     Height:         Intake/Output Summary (Last 24 hours) at 02/11/2024 1200 Last data filed at 02/11/2024 0411 Gross per 24 hour  Intake 110 ml  Output 2850 ml  Net -2740 ml   Filed Weights   02/08/24 2232 02/09/24 0518 02/11/24 0408  Weight: 46.3 kg 79.4 kg 50.8 kg    Examination:  General exam: Chronically ill female appears older than stated age, AAO x 3 HEENT: Positive JVD CVS: S1-S2, regular rhythm Lungs: Bilateral basilar Rales Abdomen: Soft, nontender, bowel sounds present Extremities: Trace edema, left AKA Psychiatry:  Mood & affect appropriate.     Data Reviewed:   CBC: Recent Labs  Lab 02/08/24 2255 02/09/24 0023 02/09/24 1127 02/10/24 0454 02/11/24 0428  WBC 3.9* 3.8* 6.2 6.3 7.5  NEUTROABS  --  2.5  --   --   --   HGB 6.4* 6.4* 10.2* 9.6* 10.9*  HCT 20.9* 21.2* 30.2* 28.9* 32.9*  MCV 73.3* 74.1* 73.7* 73.9* 73.6*  PLT 261 295 245 239 270   Basic Metabolic Panel: Recent Labs  Lab 02/08/24 2255 02/09/24 1127 02/10/24 0454 02/11/24 0428  NA 126* 125* 125* 131*  K 4.2 3.4* 3.2* 4.0  CL 94* 89* 86* 89*  CO2 22 26 27 31   GLUCOSE 124* 144* 71 83  BUN 18 19 17 16   CREATININE 0.75 0.79 0.98 1.09*  CALCIUM  9.2 8.8* 8.6* 9.3  MG  --  1.8  --   --  GFR: Estimated Creatinine Clearance: 36.2 mL/min (A) (by C-G formula based on SCr of 1.09 mg/dL (H)). Liver Function Tests: Recent Labs  Lab 02/09/24 0023  AST 23  ALT 23  ALKPHOS 86  BILITOT 0.6  PROT 7.3  ALBUMIN  3.3*   No results for input(s): "LIPASE", "AMYLASE" in the last 168 hours. No results for input(s): "AMMONIA" in the last 168 hours. Coagulation Profile: No results for input(s): "INR", "PROTIME" in the last 168 hours. Cardiac Enzymes: No results for input(s): "CKTOTAL", "CKMB", "CKMBINDEX", "TROPONINI" in the last 168 hours. BNP (last 3 results) No results for input(s): "PROBNP" in the last 8760 hours. HbA1C: Recent Labs    02/09/24 1127  HGBA1C 7.2*   CBG: Recent Labs  Lab  02/10/24 1100 02/10/24 1628 02/10/24 2123 02/11/24 0744 02/11/24 1131  GLUCAP 95 77 122* 97 163*   Lipid Profile: No results for input(s): "CHOL", "HDL", "LDLCALC", "TRIG", "CHOLHDL", "LDLDIRECT" in the last 72 hours. Thyroid Function Tests: No results for input(s): "TSH", "T4TOTAL", "FREET4", "T3FREE", "THYROIDAB" in the last 72 hours. Anemia Panel: Recent Labs    02/08/24 2254 02/09/24 0023 02/09/24 1127  VITAMINB12  --   --  1,222*  FOLATE  --  18.0  --   FERRITIN  --  17  --   TIBC  --  526*  --   IRON  --  23*  --   RETICCTPCT 1.5  --   --    Urine analysis:    Component Value Date/Time   COLORURINE YELLOW 04/03/2022 1139   APPEARANCEUR HAZY (A) 04/03/2022 1139   LABSPEC 1.029 04/03/2022 1139   PHURINE 5.0 04/03/2022 1139   GLUCOSEU 150 (A) 04/03/2022 1139   HGBUR SMALL (A) 04/03/2022 1139   BILIRUBINUR NEGATIVE 04/03/2022 1139   KETONESUR NEGATIVE 04/03/2022 1139   PROTEINUR 100 (A) 04/03/2022 1139   NITRITE NEGATIVE 04/03/2022 1139   LEUKOCYTESUR NEGATIVE 04/03/2022 1139   Sepsis Labs: @LABRCNTIP (procalcitonin:4,lacticidven:4)  )No results found for this or any previous visit (from the past 240 hours).   Radiology Studies: ECHOCARDIOGRAM COMPLETE Result Date: 02/10/2024    ECHOCARDIOGRAM REPORT   Patient Name:   Jill Munoz Date of Exam: 02/10/2024 Medical Rec #:  [id number]       Height:       61.0 in Accession #:    [id number]      Weight:       175.0 lb Date of Birth:  [birth year]        BSA:          1.785 m Patient Age:    72 years        BP:           125/56 mmHg Patient Gender: F               HR:           75 bpm. Exam Location:  Inpatient Procedure: 2D Echo, Cardiac Doppler, Color Doppler and Intracardiac            Opacification Agent (Both Spectral and Color Flow Doppler were            utilized during procedure). Indications:    I50.40* Unspecified combined systolic (congestive) and diastolic                 (congestive) heart failure  History:         Patient has prior history of Echocardiogram examinations, most  recent 04/03/2022. CHF, Signs/Symptoms:Shortness of Breath and                 Bacteremia; Risk Factors:Diabetes.  Sonographer:    Jill Munoz RDCS Referring Phys: [id number] Jill Munoz IMPRESSIONS  1. Left ventricular ejection fraction, by estimation, is 40 to 45%. The left ventricle has mildly decreased function. The left ventricle demonstrates regional wall motion abnormalities (see scoring diagram/findings for description). Left ventricular diastolic parameters are consistent with Grade I diastolic dysfunction (impaired relaxation).  2. Right ventricular systolic function is mildly reduced. The right ventricular size is normal. Tricuspid regurgitation signal is inadequate for assessing PA pressure.  3. A small pericardial effusion is present. The pericardial effusion is circumferential. There is no evidence of cardiac tamponade. Moderate pleural effusion in the left lateral region.  4. The mitral valve is grossly normal. Mild mitral valve regurgitation. No evidence of mitral stenosis.  5. The aortic valve is tricuspid. Aortic valve regurgitation is not visualized. No aortic stenosis is present.  6. The inferior vena cava is dilated in size with >50% respiratory variability, suggesting right atrial pressure of 8 mmHg. FINDINGS  Left Ventricle: Left ventricular ejection fraction, by estimation, is 40 to 45%. The left ventricle has mildly decreased function. The left ventricle demonstrates regional wall motion abnormalities. Definity contrast agent was given IV to delineate the left ventricular endocardial borders. The left ventricular internal cavity size was normal in size. There is borderline left ventricular hypertrophy. Left ventricular diastolic parameters are consistent with Grade I diastolic dysfunction (impaired relaxation).  LV Wall Scoring: The mid and distal anterior septum, mid and distal inferior wall, mid inferoseptal  segment, and apex are hypokinetic. Right Ventricle: The right ventricular size is normal. No increase in right ventricular wall thickness. Right ventricular systolic function is mildly reduced. Tricuspid regurgitation signal is inadequate for assessing PA pressure. Left Atrium: Left atrial size was normal in size. Right Atrium: Right atrial size was normal in size. Pericardium: A small pericardial effusion is present. The pericardial effusion is circumferential. There is no evidence of cardiac tamponade. Mitral Valve: The mitral valve is grossly normal. Mild mitral valve regurgitation. No evidence of mitral valve stenosis. MV peak gradient, 7.0 mmHg. The mean mitral valve gradient is 3.0 mmHg. Tricuspid Valve: The tricuspid valve is grossly normal. Tricuspid valve regurgitation is trivial. No evidence of tricuspid stenosis. Aortic Valve: The aortic valve is tricuspid. Aortic valve regurgitation is not visualized. No aortic stenosis is present. Pulmonic Valve: The pulmonic valve was grossly normal. Pulmonic valve regurgitation is not visualized. No evidence of pulmonic stenosis. Aorta: The aortic root and ascending aorta are structurally normal, with no evidence of dilitation. Venous: The inferior vena cava is dilated in size with greater than 50% respiratory variability, suggesting right atrial pressure of 8 mmHg. IAS/Shunts: The interatrial septum was not well visualized. Additional Comments: There is a moderate pleural effusion in the left lateral region.  LEFT VENTRICLE PLAX 2D LVIDd:         4.05 cm      Diastology LVIDs:         3.10 cm      LV e' medial:    4.13 cm/s LV PW:         1.15 cm      LV E/e' medial:  19.0 LV IVS:        0.95 cm      LV e' lateral:   5.77 cm/s LVOT diam:     2.10 cm  LV E/e' lateral: 13.6 LV SV:         66 LV SV Index:   37 LVOT Area:     3.46 cm  LV Volumes (MOD) LV vol d, MOD A2C: 139.0 ml LV vol d, MOD A4C: 109.0 ml LV vol s, MOD A2C: 82.3 ml LV vol s, MOD A4C: 64.7 ml LV SV  MOD A2C:     56.7 ml LV SV MOD A4C:     109.0 ml LV SV MOD BP:      51.4 ml RIGHT VENTRICLE            IVC RV S prime:     9.14 cm/s  IVC diam: 2.20 cm TAPSE (M-mode): 1.2 cm LEFT ATRIUM             Index        RIGHT ATRIUM           Index LA diam:        4.00 cm 2.24 cm/m   RA Area:     14.00 cm LA Vol (A2C):   35.3 ml 19.78 ml/m  RA Volume:   33.40 ml  18.72 ml/m LA Vol (A4C):   43.5 ml 24.38 ml/m LA Biplane Vol: 38.6 ml 21.63 ml/m  AORTIC VALVE LVOT Vmax:   90.60 cm/s LVOT Vmean:  61.600 cm/s LVOT VTI:    0.190 m  AORTA Ao Root diam: 3.00 cm Ao Asc diam:  3.30 cm MITRAL VALVE MV Area (PHT): 4.15 cm     SHUNTS MV Area VTI:   2.32 cm     Systemic VTI:  0.19 m MV Peak grad:  7.0 mmHg     Systemic Diam: 2.10 cm MV Mean grad:  3.0 mmHg MV Vmax:       1.32 m/s MV Vmean:      77.6 cm/s MV Decel Time: 183 msec MV E velocity: 78.30 cm/s MV A velocity: 121.00 cm/s MV E/A ratio:  0.65 Sunit Tolia Electronically signed by [employer] Signature Date/Time: 02/10/2024/6:19:18 PM    Final      Scheduled Meds:  sodium chloride    Intravenous Once   dorzolamide -timolol   1 drop Both Eyes BID   furosemide  60 mg Intravenous Q12H   insulin  aspart  0-5 Units Subcutaneous QHS   insulin  aspart  0-6 Units Subcutaneous TID WC   ipratropium-albuterol  3 mL Nebulization BID   losartan   25 mg Oral QHS   pantoprazole   40 mg Oral Daily   polyethylene glycol  17 g Oral Daily   rosuvastatin   20 mg Oral QHS   senna-docusate  1 tablet Oral BID   sodium chloride  flush  3 mL Intravenous Q12H   Continuous Infusions:  iron sucrose 200 mg (02/11/24 1147)     LOS: 2 days    Time spent:    Deforest Fast, MD [facility]   02/11/2024, 12:00 PM

## 2024-02-11 NOTE — Plan of Care (Signed)

## 2024-02-12 DIAGNOSIS — I5023 Acute on chronic systolic (congestive) heart failure: Secondary | ICD-10-CM | POA: Diagnosis not present

## 2024-02-12 LAB — CBC
HCT: 34.1 % — ABNORMAL LOW (ref 36.0–46.0)
Hemoglobin: 11 g/dL — ABNORMAL LOW (ref 12.0–15.0)
MCH: 24.5 pg — ABNORMAL LOW (ref 26.0–34.0)
MCHC: 32.3 g/dL (ref 30.0–36.0)
MCV: 75.9 fL — ABNORMAL LOW (ref 80.0–100.0)
Platelets: 309 10*3/uL (ref 150–400)
RBC: 4.49 MIL/uL (ref 3.87–5.11)
RDW: 19.4 % — ABNORMAL HIGH (ref 11.5–15.5)
WBC: 8.1 10*3/uL (ref 4.0–10.5)
nRBC: 0 % (ref 0.0–0.2)

## 2024-02-12 LAB — BASIC METABOLIC PANEL WITH GFR
Anion gap: 11 (ref 5–15)
BUN: 25 mg/dL — ABNORMAL HIGH (ref 8–23)
CO2: 29 mmol/L (ref 22–32)
Calcium: 8.9 mg/dL (ref 8.9–10.3)
Chloride: 90 mmol/L — ABNORMAL LOW (ref 98–111)
Creatinine, Ser: 1.4 mg/dL — ABNORMAL HIGH (ref 0.44–1.00)
GFR, Estimated: 40 mL/min — ABNORMAL LOW (ref >60)
Glucose, Bld: 88 mg/dL (ref 70–99)
Potassium: 4.3 mmol/L (ref 3.5–5.1)
Sodium: 130 mmol/L — ABNORMAL LOW (ref 135–145)

## 2024-02-12 LAB — GLUCOSE, CAPILLARY: Glucose-Capillary: 105 mg/dL — ABNORMAL HIGH (ref 70–99)

## 2024-02-12 MED ORDER — FUROSEMIDE 20 MG PO TABS: 20.0000 mg | ORAL_TABLET | Freq: Every day | ORAL | 1 refills | Status: DC

## 2024-02-12 MED ORDER — POLYETHYLENE GLYCOL 3350 17 G PO PACK
17.0000 g | PACK | Freq: Every day | ORAL | 0 refills | Status: DC
Start: 2024-02-12 — End: 2024-06-19

## 2024-02-12 MED ORDER — IPRATROPIUM-ALBUTEROL 0.5-2.5 (3) MG/3ML IN SOLN: 3.0000 mL | Freq: Four times a day (QID) | RESPIRATORY_TRACT | Status: DC | PRN

## 2024-02-12 NOTE — Care Management Important Message (Signed)
 Important Message  Patient Details  Name: Jill Munoz MRN: [id number] Date of Birth: 1950/11/07   Important Message Given:  Yes - Medicare IM     Janith Melnick 02/12/2024, 10:03 AM

## 2024-02-12 NOTE — Discharge Summary (Signed)
 Physician Discharge Summary  Jill Munoz:[id number] DOB: [birth year] DOA: 02/08/2024  PCP: Jearldine Mina, MD  Admit date: 02/08/2024 Discharge date: 02/12/2024  Time spent: 45 minutes  Recommendations for Outpatient Follow-up:  PCP in 1 week, please attempt to wean off O2 at follow-up  Discharge Diagnoses:  Principal Problem:   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    Acute hypoxic respiratory failure Severe iron deficiency anemia Peripheral artery disease Left AKA   Hyponatremia   Acute on chronic anemia   Leukopenia   Non-insulin  dependent type 2 diabetes mellitus (HCC)   Acute respiratory failure with hypoxia [facility])   Discharge Condition: Improving  Diet recommendation: Low-salt, diabetic  Filed Weights   02/09/24 0518 02/11/24 0408 02/12/24 0429  Weight: 79.4 kg 50.8 kg 50.1 kg    History of present illness:  72/F w/ type 2 diabetes mellitus, chronic HFmrEF, PAD, left AKA and chronic iron defi anemia who presents with shortness of breath and frothy sputum. - Worsening dyspnea for few days, noted to be hypoxic, in the ED tachypnea, hypoxia, hemoglobin 6.4, BNP 2458 Chest x-ray is concerning for CHF. -Transfused 2 units PRBC, started on IV Lasix  Hospital Course:   Acute on chronic HFmrEF; Acute hypoxic respiratory failure  -Prior echo 6/23 with EF 40-45%, grade 1 DD, normal RV - Diuresed with IV Lasix, volume status is improving, 6 L negative  - Transitioned to oral Lasix and Aldactone  , BP is soft, losartan  held at discharge -Owing to AKA and bedbound status she would be a poor candidate for SGLT2i -Repeat echo largely unchanged, EF 40-45%, normal RV - Follow-up with [facility] Dr. Albert Huff in 2 to 3 weeks, restart losartan  if blood pressure tolerates   Severe iron deficiency anemia  Mild leukopenia  -Transfused 2 units of PRBC -Continue IV iron -Suspect chronic blood loss anemia, Hemoccult negative at this time -Discussed with  patient and spouse, regarding GI workup, they are now open to consider this, discussed with [facility], Dr.Karki-she will arrange colonoscopy and or endoscopy as outpatient in the next few weeks   Hyponatremia  - Serum sodium is 125 in setting of hypervolemia  - Improved   Type II DM  - A1c was 7.7% in November 2023  - Metformin resumed, recommend carb modified diet   PAD, left AKA  Discharge Exam: Vitals:   02/12/24 0429 02/12/24 0816  BP: (!) 120/56   Pulse: 78   Resp: 17   Temp: 99.1 F (37.3 C)   SpO2: 92% 92%   General exam: Chronically ill female appears older than stated age, AAO x 3 HEENT:no JVD CVS: S1-S2, regular rhythm Lungs: Rare basilar Rales  abdomen: Soft, nontender, bowel sounds present Extremities: Trace edema, left AKA Psychiatry:  Mood & affect appropriate.   Discharge Instructions   Discharge Instructions     Diet - low sodium heart healthy   Complete by: As directed    Increase activity slowly   Complete by: As directed       Allergies as of 02/12/2024   No Known Allergies      Medication List     STOP taking these medications    Farxiga  10 MG Tabs tablet Generic drug: dapagliflozin  propanediol   fluconazole  100 MG tablet Commonly known as: DIFLUCAN    Jardiance  10 MG Tabs tablet Generic drug: empagliflozin    losartan  50 MG tablet Commonly known as: COZAAR        TAKE these medications    Accu-Chek Guide test  strip Generic drug: glucose blood Use as instructed once a day   Berberine Chloride 500 MG Caps Take 1 capsule by mouth in the morning, at noon, and at bedtime.   dorzolamide -timolol  2-0.5 % ophthalmic solution Commonly known as: COSOPT  Place 1 drop into both eyes 2 (two) times daily.   furosemide 20 MG tablet Commonly known as: Lasix Take 1 tablet (20 mg total) by mouth daily.   melatonin 3 MG Tabs tablet Take 3 mg by mouth at bedtime.   metFORMIN 500 MG tablet Commonly known as: GLUCOPHAGE Take 500 mg by  mouth in the morning and at bedtime.   pantoprazole  40 MG tablet Commonly known as: PROTONIX  Take 1 tablet (40 mg total) by mouth daily.   polyethylene glycol 17 g packet Commonly known as: MIRALAX  / GLYCOLAX  Take 17 g by mouth daily.   rosuvastatin  20 MG tablet Commonly known as: CRESTOR  Take 1 tablet (20 mg total) by mouth at bedtime.   spironolactone  25 MG tablet Commonly known as: ALDACTONE  Take 1 tablet (25 mg total) by mouth daily.   vitamin C 1000 MG tablet Take 1,000 mg by mouth daily.               Durable Medical Equipment  (From admission, onward)           Start     Ordered   02/12/24 1012  For home use only DME oxygen  Once       Question Answer Comment  Length of Need 6 Months   Mode or (Route) Nasal cannula   Liters per Minute 2   Oxygen delivery system Gas      02/12/24 1011           No Known Allergies  Follow-up Information     [facility], [facility] Follow up.   Specialty: Home Health Services Why: Physical Therapy, Aide, Registered Nurse-office to call with visit times. Contact information: [address] [place] [address] [phone]         [employer] [facility] Follow up.   Why: Oxygen-to be delivered to the room before discharge. Contact information: Located in: [employer] Address: [address], [place], [place] [address] Phone: [phone]                 The results of significant diagnostics from this hospitalization (including imaging, microbiology, ancillary and laboratory) are listed below for reference.    Significant Diagnostic Studies: ECHOCARDIOGRAM COMPLETE Result Date: 02/10/2024    ECHOCARDIOGRAM REPORT   Patient Name:   Jill Munoz Date of Exam: 02/10/2024 Medical Rec #:  [id number]       Height:       61.0 in Accession #:    [id number]      Weight:       175.0 lb Date of Birth:  1951/05/19        BSA:          1.785 m Patient Age:    72 years        BP:            125/56 mmHg Patient Gender: F               HR:           75 bpm. Exam Location:  Inpatient Procedure: 2D Echo, Cardiac Doppler, Color Doppler and Intracardiac            Opacification Agent (Both Spectral and Color Flow  Doppler were            utilized during procedure). Indications:    I50.40* Unspecified combined systolic (congestive) and diastolic                 (congestive) heart failure  History:        Patient has prior history of Echocardiogram examinations, most                 recent 04/03/2022. CHF, Signs/Symptoms:Shortness of Breath and                 Bacteremia; Risk Factors:Diabetes.  Sonographer:    Raynelle Callow RDCS Referring Phys: [id number] TIMOTHY S OPYD IMPRESSIONS  1. Left ventricular ejection fraction, by estimation, is 40 to 45%. The left ventricle has mildly decreased function. The left ventricle demonstrates regional wall motion abnormalities (see scoring diagram/findings for description). Left ventricular diastolic parameters are consistent with Grade I diastolic dysfunction (impaired relaxation).  2. Right ventricular systolic function is mildly reduced. The right ventricular size is normal. Tricuspid regurgitation signal is inadequate for assessing PA pressure.  3. A small pericardial effusion is present. The pericardial effusion is circumferential. There is no evidence of cardiac tamponade. Moderate pleural effusion in the left lateral region.  4. The mitral valve is grossly normal. Mild mitral valve regurgitation. No evidence of mitral stenosis.  5. The aortic valve is tricuspid. Aortic valve regurgitation is not visualized. No aortic stenosis is present.  6. The inferior vena cava is dilated in size with >50% respiratory variability, suggesting right atrial pressure of 8 mmHg. FINDINGS  Left Ventricle: Left ventricular ejection fraction, by estimation, is 40 to 45%. The left ventricle has mildly decreased function. The left ventricle demonstrates regional wall motion abnormalities.  Definity contrast agent was given IV to delineate the left ventricular endocardial borders. The left ventricular internal cavity size was normal in size. There is borderline left ventricular hypertrophy. Left ventricular diastolic parameters are consistent with Grade I diastolic dysfunction (impaired relaxation).  LV Wall Scoring: The mid and distal anterior septum, mid and distal inferior wall, mid inferoseptal segment, and apex are hypokinetic. Right Ventricle: The right ventricular size is normal. No increase in right ventricular wall thickness. Right ventricular systolic function is mildly reduced. Tricuspid regurgitation signal is inadequate for assessing PA pressure. Left Atrium: Left atrial size was normal in size. Right Atrium: Right atrial size was normal in size. Pericardium: A small pericardial effusion is present. The pericardial effusion is circumferential. There is no evidence of cardiac tamponade. Mitral Valve: The mitral valve is grossly normal. Mild mitral valve regurgitation. No evidence of mitral valve stenosis. MV peak gradient, 7.0 mmHg. The mean mitral valve gradient is 3.0 mmHg. Tricuspid Valve: The tricuspid valve is grossly normal. Tricuspid valve regurgitation is trivial. No evidence of tricuspid stenosis. Aortic Valve: The aortic valve is tricuspid. Aortic valve regurgitation is not visualized. No aortic stenosis is present. Pulmonic Valve: The pulmonic valve was grossly normal. Pulmonic valve regurgitation is not visualized. No evidence of pulmonic stenosis. Aorta: The aortic root and ascending aorta are structurally normal, with no evidence of dilitation. Venous: The inferior vena cava is dilated in size with greater than 50% respiratory variability, suggesting right atrial pressure of 8 mmHg. IAS/Shunts: The interatrial septum was not well visualized. Additional Comments: There is a moderate pleural effusion in the left lateral region.  LEFT VENTRICLE PLAX 2D LVIDd:         4.05 cm  Diastology LVIDs:         3.10 cm      LV e' medial:    4.13 cm/s LV PW:         1.15 cm      LV E/e' medial:  19.0 LV IVS:        0.95 cm      LV e' lateral:   5.77 cm/s LVOT diam:     2.10 cm      LV E/e' lateral: 13.6 LV SV:         66 LV SV Index:   37 LVOT Area:     3.46 cm  LV Volumes (MOD) LV vol d, MOD A2C: 139.0 ml LV vol d, MOD A4C: 109.0 ml LV vol s, MOD A2C: 82.3 ml LV vol s, MOD A4C: 64.7 ml LV SV MOD A2C:     56.7 ml LV SV MOD A4C:     109.0 ml LV SV MOD BP:      51.4 ml RIGHT VENTRICLE            IVC RV S prime:     9.14 cm/s  IVC diam: 2.20 cm TAPSE (M-mode): 1.2 cm LEFT ATRIUM             Index        RIGHT ATRIUM           Index LA diam:        4.00 cm 2.24 cm/m   RA Area:     14.00 cm LA Vol (A2C):   35.3 ml 19.78 ml/m  RA Volume:   33.40 ml  18.72 ml/m LA Vol (A4C):   43.5 ml 24.38 ml/m LA Biplane Vol: 38.6 ml 21.63 ml/m  AORTIC VALVE LVOT Vmax:   90.60 cm/s LVOT Vmean:  61.600 cm/s LVOT VTI:    0.190 m  AORTA Ao Root diam: 3.00 cm Ao Asc diam:  3.30 cm MITRAL VALVE MV Area (PHT): 4.15 cm     SHUNTS MV Area VTI:   2.32 cm     Systemic VTI:  0.19 m MV Peak grad:  7.0 mmHg     Systemic Diam: 2.10 cm MV Mean grad:  3.0 mmHg MV Vmax:       1.32 m/s MV Vmean:      77.6 cm/s MV Decel Time: 183 msec MV E velocity: 78.30 cm/s MV A velocity: 121.00 cm/s MV E/A ratio:  0.65 Sunit Tolia Electronically signed by Olinda Bertrand Signature Date/Time: 02/10/2024/6:19:18 PM    Final    DG Chest 2 View Result Date: 02/08/2024 CLINICAL DATA:  Difficulty breathing for 2 days EXAM: CHEST - 2 VIEW COMPARISON:  04/26/2022 FINDINGS: Frontal and lateral views of the chest demonstrate enlarged cardiac silhouette. Lung volumes are diminished, with pulmonary vascular congestion, diffuse interstitial prominence, and bibasilar consolidation and effusions. No pneumothorax. No acute bony abnormalities. IMPRESSION: 1. Low lung volumes, with constellation of findings suggesting superimposed congestive heart failure.  Electronically Signed   By: Bobbye Burrow M.D.   On: 02/08/2024 23:48    Microbiology: No results found for this or any previous visit (from the past 240 hours).   Labs: Basic Metabolic Panel: Recent Labs  Lab 02/08/24 2255 02/09/24 1127 02/10/24 0454 02/11/24 0428 02/12/24 0446  NA 126* 125* 125* 131* 130*  K 4.2 3.4* 3.2* 4.0 4.3  CL 94* 89* 86* 89* 90*  CO2 22 26 27 31 29   GLUCOSE 124* 144* 71 83 88  BUN  18 19 17 16  25*  CREATININE 0.75 0.79 0.98 1.09* 1.40*  CALCIUM  9.2 8.8* 8.6* 9.3 8.9  MG  --  1.8  --   --   --    Liver Function Tests: Recent Labs  Lab 02/09/24 0023  AST 23  ALT 23  ALKPHOS 86  BILITOT 0.6  PROT 7.3  ALBUMIN  3.3*   No results for input(s): "LIPASE", "AMYLASE" in the last 168 hours. No results for input(s): "AMMONIA" in the last 168 hours. CBC: Recent Labs  Lab 02/09/24 0023 02/09/24 1127 02/10/24 0454 02/11/24 0428 02/12/24 0446  WBC 3.8* 6.2 6.3 7.5 8.1  NEUTROABS 2.5  --   --   --   --   HGB 6.4* 10.2* 9.6* 10.9* 11.0*  HCT 21.2* 30.2* 28.9* 32.9* 34.1*  MCV 74.1* 73.7* 73.9* 73.6* 75.9*  PLT 295 245 239 270 309   Cardiac Enzymes: No results for input(s): "CKTOTAL", "CKMB", "CKMBINDEX", "TROPONINI" in the last 168 hours. BNP: BNP (last 3 results) Recent Labs    02/09/24 0018  BNP 2,457.6*    ProBNP (last 3 results) No results for input(s): "PROBNP" in the last 8760 hours.  CBG: Recent Labs  Lab 02/11/24 0744 02/11/24 1131 02/11/24 1634 02/11/24 2136 02/12/24 0813  GLUCAP 97 163* 142* 209* 105*       Signed:  Deforest Fast MD.  [facility] 02/12/2024, 10:29 AM

## 2024-02-12 NOTE — TOC Initial Note (Signed)
 Transition of Care [facility]) - Initial/Assessment Note    Patient Details  Name: Jill Munoz MRN: [id number] Date of Birth: 02/15/1951  [facility] CM/SW Contact:    Cosimo Diones, RN Phone Number: 02/12/2024, 10:17 AM  Clinical Narrative:  Patient presented for shortness of breath. PTA patient was wheelchair bound from home with spouse. Patient and spouse in the room with another family friend and we discussed home health services. Family is agreeable to home health- no agency preference- referral submitted to [facility] for RN, PT, Aide. Start of care to begin within 24-48 hours post transition of care. Patient is agreeable for [facility] to screen for Medicaid. FC will reach out the patient at home. Patient in need of oxygen for home- [employer] called for DME. Spouse will transport patient home via private vehicle. No further needs identified.                    Expected Discharge Plan: Home w Home Health Services Barriers to Discharge: No Barriers Identified   Patient Goals and CMS Choice Patient states their goals for this hospitalization and ongoing recovery are:: Plan to return home with [facility]  Expected Discharge Plan and Services In-house Referral: Financial Counselor Discharge Planning Services: CM Consult     Expected Discharge Date: 02/12/24               DME Arranged: Oxygen DME Agency: [employer] Date DME Agency Contacted: 02/12/24 Time DME Agency Contacted: 1013 Representative spoke with at DME Agency: Zula Hitch HH Arranged: RN, Disease Management, PT, Nurse's Aide HH Agency: [facility] Date [facility] Agency Contacted: 02/12/24 Time HH Agency Contacted: 1000 Representative spoke with at [facility] Agency: Randel Buss  Prior Living Arrangements/Services   Lives with:: Spouse Patient language and need for interpreter reviewed:: Yes Do you feel safe going back to the place where you live?: Yes      Need for Family Participation in  Patient Care: Yes (Comment) Care giver support system in place?: Yes (comment) Current home services: DME (lightweight wheelchair) Criminal Activity/Legal Involvement Pertinent to Current Situation/Hospitalization: No - Comment as needed  Activities of Daily Living   ADL Screening (condition at time of admission) Independently performs ADLs?: No Does the patient have a NEW difficulty with bathing/dressing/toileting/self-feeding that is expected to last >3 days?: No Does the patient have a NEW difficulty with getting in/out of bed, walking, or climbing stairs that is expected to last >3 days?: No Does the patient have a NEW difficulty with communication that is expected to last >3 days?: No Is the patient deaf or have difficulty hearing?: No Does the patient have difficulty seeing, even when wearing glasses/contacts?: Yes Does the patient have difficulty concentrating, remembering, or making decisions?: No  Permission Sought/Granted Permission sought to share information with : Family Supports, Case Production designer, theatre/television/film, Oceanographer granted to share information with : Yes, Verbal Permission Granted     Permission granted to share info w AGENCY: Economist    Emotional Assessment Appearance:: Appears stated age Attitude/Demeanor/Rapport: Engaged Affect (typically observed): Appropriate Orientation: : Oriented to Self, Oriented to Place, Oriented to  Time, Oriented to Situation Alcohol / Substance Use: Not Applicable Psych Involvement: No (comment)  Admission diagnosis:  Hyponatremia [E87.1] Symptomatic anemia [D64.9] Acute on chronic heart failure, unspecified heart failure type (HCC) [I50.9] 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 (HFmrEF, 41-49%) (HCC) [I50.23] Patient Active Problem List   Diagnosis Date  Noted   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 (HFmrEF, 41-49%) (HCC) 02/09/2024   Acute on  chronic anemia 02/09/2024   Leukopenia 02/09/2024   Non-insulin  dependent type 2 diabetes mellitus (HCC) 02/09/2024   Acute respiratory failure with hypoxia (HCC) 02/09/2024   GERD without esophagitis 08/22/2022   Amputee, above knee, left (HCC) 05/11/2022   Anemia of chronic disease 05/11/2022   Constipation 05/11/2022   Kyphosis of thoracic region 05/11/2022   Blurry vision, bilateral 05/11/2022   Hypertension associated with diabetes (HCC) 05/11/2022   Closed compression fracture of thoracic vertebra (HCC) 05/11/2022   Weakness    Hypokalemia 04/10/2022   Bacteremia due to Streptococcus Pyogenes 04/03/2022   Type 2 diabetes mellitus with hyperglycemia (HCC) 04/03/2022   Beta-hemolytic group A streptococcal sepsis (HCC)    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 AT [address] OF [facility] [address] [place] [address] Phone: [phone] Fax: [phone]  Social Drivers of Health (SDOH) Social History: SDOH Screenings   Food Insecurity: No Food Insecurity (02/09/2024)  Housing: Low Risk  (02/09/2024)  Transportation Needs: No Transportation Needs (02/09/2024)  Utilities: Not At Risk (02/09/2024)  Alcohol Screen: Low Risk  (10/14/2023)  Depression (PHQ2-9): Low Risk  (10/14/2023)  Financial Resource Strain: Low Risk  (10/14/2023)  Physical Activity: Inactive (10/14/2023)  Social Connections: Moderately Isolated (02/09/2024)  Stress: No Stress Concern Present (10/14/2023)  Tobacco Use: Low Risk  (02/09/2024)  Health Literacy: Adequate Health Literacy (10/14/2023)    Readmission Risk Interventions     No data to display

## 2024-02-12 NOTE — Plan of Care (Signed)
  Problem: Education: Goal: Ability to describe self-care measures that may prevent or decrease complications (Diabetes Survival Skills Education) will improve Outcome: Adequate for Discharge Goal: Individualized Educational Video(s) Outcome: Adequate for Discharge   Problem: Coping: Goal: Ability to adjust to condition or change in health will improve Outcome: Adequate for Discharge   Problem: Fluid Volume: Goal: Ability to maintain a balanced intake and output will improve Outcome: Adequate for Discharge   Problem: Health Behavior/Discharge Planning: Goal: Ability to identify and utilize available resources and services will improve Outcome: Adequate for Discharge Goal: Ability to manage health-related needs will improve Outcome: Adequate for Discharge   Problem: Metabolic: Goal: Ability to maintain appropriate glucose levels will improve Outcome: Adequate for Discharge   Problem: Nutritional: Goal: Maintenance of adequate nutrition will improve Outcome: Adequate for Discharge Goal: Progress toward achieving an optimal weight will improve Outcome: Adequate for Discharge   Problem: Skin Integrity: Goal: Risk for impaired skin integrity will decrease Outcome: Adequate for Discharge   Problem: Tissue Perfusion: Goal: Adequacy of tissue perfusion will improve Outcome: Adequate for Discharge   Problem: Education: Goal: Knowledge of General Education information will improve Description: Including pain rating scale, medication(s)/side effects and non-pharmacologic comfort measures Outcome: Adequate for Discharge   Problem: Health Behavior/Discharge Planning: Goal: Ability to manage health-related needs will improve Outcome: Adequate for Discharge   Problem: Clinical Measurements: Goal: Ability to maintain clinical measurements within normal limits will improve Outcome: Adequate for Discharge Goal: Will remain free from infection Outcome: Adequate for Discharge Goal:  Diagnostic test results will improve Outcome: Adequate for Discharge Goal: Respiratory complications will improve Outcome: Adequate for Discharge Goal: Cardiovascular complication will be avoided Outcome: Adequate for Discharge   Problem: Activity: Goal: Risk for activity intolerance will decrease Outcome: Adequate for Discharge   Problem: Nutrition: Goal: Adequate nutrition will be maintained Outcome: Adequate for Discharge   Problem: Coping: Goal: Level of anxiety will decrease Outcome: Adequate for Discharge   Problem: Elimination: Goal: Will not experience complications related to bowel motility Outcome: Adequate for Discharge Goal: Will not experience complications related to urinary retention Outcome: Adequate for Discharge   Problem: Pain Managment: Goal: General experience of comfort will improve and/or be controlled Outcome: Adequate for Discharge   Problem: Safety: Goal: Ability to remain free from injury will improve Outcome: Adequate for Discharge   Problem: Skin Integrity: Goal: Risk for impaired skin integrity will decrease Outcome: Adequate for Discharge   Problem: Education: Goal: Ability to demonstrate management of disease process will improve Outcome: Adequate for Discharge Goal: Ability to verbalize understanding of medication therapies will improve Outcome: Adequate for Discharge Goal: Individualized Educational Video(s) Outcome: Adequate for Discharge   Problem: Activity: Goal: Capacity to carry out activities will improve Outcome: Adequate for Discharge   Problem: Cardiac: Goal: Ability to achieve and maintain adequate cardiopulmonary perfusion will improve Outcome: Adequate for Discharge

## 2024-02-17 ENCOUNTER — Telehealth: Payer: Self-pay

## 2024-02-17 DIAGNOSIS — I152 Hypertension secondary to endocrine disorders: Secondary | ICD-10-CM

## 2024-02-17 NOTE — Patient Outreach (Signed)
 Submitted phone referral for assistance.   [facility]  Population Health Care Management Assistant  Direct Dial: [phone]  Fax: [phone] Website: [email or web address]

## 2024-02-18 DIAGNOSIS — Z89612 Acquired absence of left leg above knee: Secondary | ICD-10-CM | POA: Diagnosis not present

## 2024-02-18 DIAGNOSIS — D649 Anemia, unspecified: Secondary | ICD-10-CM | POA: Diagnosis not present

## 2024-02-18 DIAGNOSIS — Z9981 Dependence on supplemental oxygen: Secondary | ICD-10-CM | POA: Diagnosis not present

## 2024-02-18 DIAGNOSIS — E871 Hypo-osmolality and hyponatremia: Secondary | ICD-10-CM | POA: Diagnosis not present

## 2024-02-18 DIAGNOSIS — I081 Rheumatic disorders of both mitral and tricuspid valves: Secondary | ICD-10-CM | POA: Diagnosis not present

## 2024-02-18 DIAGNOSIS — D509 Iron deficiency anemia, unspecified: Secondary | ICD-10-CM | POA: Diagnosis not present

## 2024-02-18 DIAGNOSIS — I5023 Acute on chronic systolic (congestive) heart failure: Secondary | ICD-10-CM | POA: Diagnosis not present

## 2024-02-18 DIAGNOSIS — D72819 Decreased white blood cell count, unspecified: Secondary | ICD-10-CM | POA: Diagnosis not present

## 2024-02-18 DIAGNOSIS — E1151 Type 2 diabetes mellitus with diabetic peripheral angiopathy without gangrene: Secondary | ICD-10-CM | POA: Diagnosis not present

## 2024-02-18 DIAGNOSIS — I5030 Unspecified diastolic (congestive) heart failure: Secondary | ICD-10-CM | POA: Diagnosis not present

## 2024-02-18 DIAGNOSIS — J9601 Acute respiratory failure with hypoxia: Secondary | ICD-10-CM | POA: Diagnosis not present

## 2024-02-19 ENCOUNTER — Telehealth: Payer: Self-pay | Admitting: Cardiology

## 2024-02-19 DIAGNOSIS — I5032 Chronic diastolic (congestive) heart failure: Secondary | ICD-10-CM

## 2024-02-19 DIAGNOSIS — R609 Edema, unspecified: Secondary | ICD-10-CM

## 2024-02-19 NOTE — Telephone Encounter (Signed)
 Calling about the patient has swelling. The swelling is from right mid chin to foot; also unable to weigh patient. Please advise

## 2024-02-19 NOTE — Telephone Encounter (Signed)
 Thanks for bringing up these concerns.  However, majority of them are secondary to coordination of care and social determinants of health care.  Will defer to social work as well as primary care.  With regards to no bowel movement in the last 6 days-defer to PCP.  With regards to volume status: Elevate the legs to prevent dependent edema is much as possible. Continue oral Lasix . See if there is a way to check her weights and if so keep a log of her blood pressures and daily weights. Given her recent hospitalization and diuresis she had developed AKI.  So hesitant to uptitrate diuretics without blood work. Check a BMP and NT proBNP 48 hours prior to the next office visit to see if there is room to uptitrate medical therapy.  In the interim if she has volume overload or decreased urine output she may be need to be seen sooner either by our office/PCP and if concerns arise over the weekend recommend going back to the ER.  Please also loop in her PCP to help coordinate her care.  Othell Jaime Hendricks, DO, [facility]

## 2024-02-19 NOTE — Telephone Encounter (Signed)
 Per discharge summary from hospital on 02/12/24:  Acute on chronic HFmrEF; Acute hypoxic respiratory failure  -Prior echo 6/23 with EF 40-45%, grade 1 DD, normal RV - Diuresed with IV Lasix , volume status is improving, 6 L negative  - Transitioned to oral Lasix  and Aldactone  , BP is soft, losartan  held at discharge -Owing to AKA and bedbound status she would be a poor candidate for SGLT2i -Repeat echo largely unchanged, EF 40-45%, normal RV - Follow-up with [facility] Dr. Albert Huff in 2 to 3 weeks, restart losartan  if blood pressure tolerates  Returned call to [place] at home health (today was her first visit) but family states that Furosemide  20mg  daily and Spironolactone  25mg  daily. SOB is no different than her baseline. Wheelchair bound and Left BKA. She sits in wheelchair and sleeps in wheelchair. Pt has hospital bed but refuses to use it and leg is never elevated. Reports 2+ pitting edema on RLE from mid-shin to foot. No abdominal distention. Pt is not following low-sodium diet and doesn't adhere to fluid restrictions, but doesn't drink a lot per family. Family reports no bowel movement in 6 days and urinates only a few times per day even with fluid pills on board. Family has to get her on bedside commode. Pt lives at home with only elderly husband, but has family friends come by to check on her a few times a week. Gasper Karst has contacted social work and is trying to get them someone to come out and help a few times a week, but that's all in progress. Will route note to Albert Huff, MD who pt saw last 05/14/23 (pt scheduled to be seen here on 03/03/23). Helpers seem agreeable to trying to prop leg up with another chair, but Jerlene Moody is not confident in that. She understands that short course of lasix  can be given, but dependant edema likely to be ongoing issue and over diuresing is a very real possibility in this situation. Will get Tolia's recommendation and call her back.

## 2024-02-20 NOTE — Telephone Encounter (Signed)
 Spoke with Beth from [place] and explained Dr. Jesus Morones recommendations. Beth stated that pt is going to PCP appointment tomorrow as well. Beth stated that it can be hard for the pt to get out for labs so if they can't be drawn tomorrow, they Jill Munoz) will have them drawn and took a verbal order over the phone for the BMP and NT Pro-BNP lab draws. She stated the results would be faxed to our office. Beth verbalized understanding of all recommendations including ED precautions if volume overload/ decreased urine output became evident.

## 2024-02-20 NOTE — Addendum Note (Signed)
 Addended by: Roxianne Coral on: 02/20/2024 10:19 AM   Modules accepted: Orders

## 2024-02-21 DIAGNOSIS — D72819 Decreased white blood cell count, unspecified: Secondary | ICD-10-CM | POA: Diagnosis not present

## 2024-02-21 DIAGNOSIS — D649 Anemia, unspecified: Secondary | ICD-10-CM | POA: Diagnosis not present

## 2024-02-21 DIAGNOSIS — Z9981 Dependence on supplemental oxygen: Secondary | ICD-10-CM | POA: Diagnosis not present

## 2024-02-21 DIAGNOSIS — J9601 Acute respiratory failure with hypoxia: Secondary | ICD-10-CM | POA: Diagnosis not present

## 2024-02-21 DIAGNOSIS — Z89612 Acquired absence of left leg above knee: Secondary | ICD-10-CM | POA: Diagnosis not present

## 2024-02-21 DIAGNOSIS — E1140 Type 2 diabetes mellitus with diabetic neuropathy, unspecified: Secondary | ICD-10-CM | POA: Diagnosis not present

## 2024-02-21 DIAGNOSIS — D509 Iron deficiency anemia, unspecified: Secondary | ICD-10-CM | POA: Diagnosis not present

## 2024-02-21 DIAGNOSIS — I5030 Unspecified diastolic (congestive) heart failure: Secondary | ICD-10-CM | POA: Diagnosis not present

## 2024-02-21 DIAGNOSIS — I081 Rheumatic disorders of both mitral and tricuspid valves: Secondary | ICD-10-CM | POA: Diagnosis not present

## 2024-02-21 DIAGNOSIS — I10 Essential (primary) hypertension: Secondary | ICD-10-CM | POA: Diagnosis not present

## 2024-02-21 DIAGNOSIS — N1831 Chronic kidney disease, stage 3a: Secondary | ICD-10-CM | POA: Diagnosis not present

## 2024-02-21 DIAGNOSIS — I5023 Acute on chronic systolic (congestive) heart failure: Secondary | ICD-10-CM | POA: Diagnosis not present

## 2024-02-21 DIAGNOSIS — I5020 Unspecified systolic (congestive) heart failure: Secondary | ICD-10-CM | POA: Diagnosis not present

## 2024-02-21 DIAGNOSIS — E1151 Type 2 diabetes mellitus with diabetic peripheral angiopathy without gangrene: Secondary | ICD-10-CM | POA: Diagnosis not present

## 2024-02-21 DIAGNOSIS — E871 Hypo-osmolality and hyponatremia: Secondary | ICD-10-CM | POA: Diagnosis not present

## 2024-02-21 LAB — LAB REPORT - SCANNED: EGFR: 67

## 2024-02-25 ENCOUNTER — Telehealth: Payer: Self-pay | Admitting: *Deleted

## 2024-02-25 NOTE — Progress Notes (Unsigned)
 Complex Care Management Note Care Guide Note  02/25/2024 Name: Oneida Headley MRN: [id number] DOB: 28-Jan-1951   Complex Care Management Outreach Attempts: An unsuccessful telephone outreach was attempted today to offer the patient information about available complex care management services.  Follow Up Plan:  Additional outreach attempts will be made to offer the patient complex care management information and services.   Encounter Outcome:  No Answer  Barnie Bora  [facility]  [facility], [facility] Guide  Direct Dial: [phone]  Fax [phone]

## 2024-02-26 NOTE — Progress Notes (Unsigned)
 Complex Care Management Note Care Guide Note  02/26/2024 Name: Jill Munoz MRN: [id number] DOB: 11/04/1950   Complex Care Management Outreach Attempts: A second unsuccessful outreach was attempted today to offer the patient with information about available complex care management services.  Follow Up Plan:  Additional outreach attempts will be made to offer the patient complex care management information and services.   Encounter Outcome:  No Answer  Barnie Bora  [facility]  [facility], [facility] Guide  Direct Dial: [phone]  Fax [phone]

## 2024-02-27 NOTE — Progress Notes (Signed)
 Complex Care Management Note  Care Guide Note 02/27/2024 Name: Clarsie Walega MRN: [id number] DOB: [birth year]  Jada Mayabb is a 73 y.o. year old female who sees Husain, Karrar, MD for primary care. I reached out to [employer] by phone today to offer complex care management services.  Ms. Thornbury was given information about Complex Care Management services today including:   The Complex Care Management services include support from the care team which includes your Nurse Care Manager, Clinical Social Worker, or Pharmacist.  The Complex Care Management team is here to help remove barriers to the health concerns and goals most important to you. Complex Care Management services are voluntary, and the patient may decline or stop services at any time by request to their care team member.   Complex Care Management Consent Status: Patient agreed to services and verbal consent obtained. Patient spouse Rendall Carpenter gave consent   Follow up plan:  Telephone appointment with complex care management team member scheduled for:  03/05/24  Encounter Outcome:  Patient Scheduled  Barnie Bora  [facility]  [facility], [facility] Guide  Direct Dial: [phone]  Fax [phone]

## 2024-02-28 DIAGNOSIS — D509 Iron deficiency anemia, unspecified: Secondary | ICD-10-CM | POA: Diagnosis not present

## 2024-02-28 DIAGNOSIS — D649 Anemia, unspecified: Secondary | ICD-10-CM | POA: Diagnosis not present

## 2024-02-28 DIAGNOSIS — I5030 Unspecified diastolic (congestive) heart failure: Secondary | ICD-10-CM | POA: Diagnosis not present

## 2024-02-28 DIAGNOSIS — Z9981 Dependence on supplemental oxygen: Secondary | ICD-10-CM | POA: Diagnosis not present

## 2024-02-28 DIAGNOSIS — D72819 Decreased white blood cell count, unspecified: Secondary | ICD-10-CM | POA: Diagnosis not present

## 2024-02-28 DIAGNOSIS — Z89612 Acquired absence of left leg above knee: Secondary | ICD-10-CM | POA: Diagnosis not present

## 2024-02-28 DIAGNOSIS — I5023 Acute on chronic systolic (congestive) heart failure: Secondary | ICD-10-CM | POA: Diagnosis not present

## 2024-02-28 DIAGNOSIS — E1151 Type 2 diabetes mellitus with diabetic peripheral angiopathy without gangrene: Secondary | ICD-10-CM | POA: Diagnosis not present

## 2024-02-28 DIAGNOSIS — E871 Hypo-osmolality and hyponatremia: Secondary | ICD-10-CM | POA: Diagnosis not present

## 2024-02-28 DIAGNOSIS — I081 Rheumatic disorders of both mitral and tricuspid valves: Secondary | ICD-10-CM | POA: Diagnosis not present

## 2024-02-28 DIAGNOSIS — J9601 Acute respiratory failure with hypoxia: Secondary | ICD-10-CM | POA: Diagnosis not present

## 2024-03-02 ENCOUNTER — Encounter: Payer: Self-pay | Admitting: Nurse Practitioner

## 2024-03-02 ENCOUNTER — Ambulatory Visit: Attending: Nurse Practitioner | Admitting: Nurse Practitioner

## 2024-03-02 VITALS — BP 138/72 | HR 80 | Ht 61.0 in

## 2024-03-02 DIAGNOSIS — E119 Type 2 diabetes mellitus without complications: Secondary | ICD-10-CM

## 2024-03-02 DIAGNOSIS — I10 Essential (primary) hypertension: Secondary | ICD-10-CM

## 2024-03-02 DIAGNOSIS — I700 Atherosclerosis of aorta: Secondary | ICD-10-CM

## 2024-03-02 DIAGNOSIS — I5042 Chronic combined systolic (congestive) and diastolic (congestive) heart failure: Secondary | ICD-10-CM | POA: Diagnosis not present

## 2024-03-02 DIAGNOSIS — D638 Anemia in other chronic diseases classified elsewhere: Secondary | ICD-10-CM

## 2024-03-02 DIAGNOSIS — I340 Nonrheumatic mitral (valve) insufficiency: Secondary | ICD-10-CM

## 2024-03-02 DIAGNOSIS — I2510 Atherosclerotic heart disease of native coronary artery without angina pectoris: Secondary | ICD-10-CM | POA: Diagnosis not present

## 2024-03-02 MED ORDER — SPIRONOLACTONE 25 MG PO TABS: 25.0000 mg | ORAL_TABLET | Freq: Every day | ORAL | 3 refills | Status: AC

## 2024-03-02 MED ORDER — ROSUVASTATIN CALCIUM 20 MG PO TABS: 20.0000 mg | ORAL_TABLET | Freq: Every day | ORAL | 3 refills | Status: DC

## 2024-03-02 MED ORDER — FUROSEMIDE 20 MG PO TABS: ORAL_TABLET | ORAL | 3 refills | Status: DC

## 2024-03-02 NOTE — Patient Instructions (Signed)
 Medication Instructions:  Furosemide  (Lasix )- take 40 mg daily for 1 week, then resume taking 20 mg daily. May take an additional 20 mg on the days of swelling or weight gain.   *If you need a refill on your cardiac medications before your next appointment, please call your pharmacy*  Lab Work: BMET in 1 week  Testing/Procedures: NONE ordered at this time of appointment   Follow-Up: At [facility], you and your health needs are our priority.  As part of our continuing mission to provide you with exceptional heart care, our providers are all part of one team.  This team includes your primary Cardiologist (physician) and Advanced Practice Providers or APPs (Physician Assistants and Nurse Practitioners) who all work together to provide you with the care you need, when you need it.  Your next appointment:   1 month(s)  Provider:   Olinda Bertrand, DO or Marlana Silvan, NP          We recommend signing up for the patient portal called "MyChart".  Sign up information is provided on this After Visit Summary.  MyChart is used to connect with patients for Virtual Visits (Telemedicine).  Patients are able to view lab/test results, encounter notes, upcoming appointments, etc.  Non-urgent messages can be sent to your provider as well.   To learn more about what you can do with MyChart, go to [email or web address].   Other Instructions

## 2024-03-02 NOTE — Progress Notes (Signed)
 Office Visit    Patient Name: Jill Munoz Date of Encounter: 03/02/2024  Primary Care Provider:  Jearldine Mina, MD Primary Cardiologist:  Olinda Bertrand, DO  Chief Complaint    73 year old female with a history of coronary artery calcification noted on prior CT, aortic atherosclerosis, chronic combined systolic and diastolic heart failure, mitral valve regurgitation, hypertension, type 2 diabetes, anemia, GERD, and necrotizing fasciitis s/p left AKA who presents for follow-up related to heart failure.  Past Medical History    Past Medical History:  Diagnosis Date   Anemia    Diabetes mellitus without complication (HCC)    Pre-diabetes    Past Surgical History:  Procedure Laterality Date   AMPUTATION  04/06/2022   Procedure: AMPUTATION ABOVE KNEE REVISION;  Surgeon: Timothy Ford, MD;  Location: [facility] OR;  Service: Orthopedics;;   I & D EXTREMITY Left 04/02/2022   Procedure: IRRIGATION AND DEBRIDEMENT LEFT LOWER LEG APPLICATION OF WOUND VAC ;  Surgeon: Bettyjane Brunet, MD;  Location: [facility] OR;  Service: Orthopedics;  Laterality: Left;   I & D EXTREMITY Left 04/04/2022   Procedure: LEFT ABOVE KNEE AMPUTATION;  Surgeon: Timothy Ford, MD;  Location: [facility] OR;  Service: Orthopedics;  Laterality: Left;   I & D EXTREMITY Left 04/06/2022   Procedure: LEFT LEG DEBRIDEMENT;  Surgeon: Timothy Ford, MD;  Location[facility] OR;  Service: Orthopedics;  Laterality: Left;   STUMP REVISION Left 04/11/2022   Procedure: REVISION LEFT ABOVE KNEE AMPUTATION;  Surgeon: Timothy Ford, MD;  Location: [facility] OR;  Service: Orthopedics;  Laterality: Left;    Allergies  No Known Allergies   Labs/Other Studies Reviewed    The following studies were reviewed today:  Cardiac Studies & Procedures   ______________________________________________________________________________________________     ECHOCARDIOGRAM  ECHOCARDIOGRAM COMPLETE 02/10/2024  Narrative ECHOCARDIOGRAM REPORT    Patient Name:   Jill Munoz Date of Exam: 02/10/2024 Medical Rec #:  [id number]       Height:       61.0 in Accession #:    [id number]      Weight:       175.0 lb Date of Birth:  23-May-1951        BSA:          1.785 m Patient Age:    72 years        BP:           125/56 mmHg Patient Gender: F               HR:           75 bpm. Exam Location:  Inpatient  Procedure: 2D Echo, Cardiac Doppler, Color Doppler and Intracardiac Opacification Agent (Both Spectral and Color Flow Doppler were utilized during procedure).  Indications:    I50.40* Unspecified combined systolic (congestive) and diastolic (congestive) heart failure  History:        Patient has prior history of Echocardiogram examinations, most recent 04/03/2022. CHF, Signs/Symptoms:Shortness of Breath and Bacteremia; Risk Factors:Diabetes.  Sonographer:    Raynelle Callow RDCS Referring Phys: [id number] TIMOTHY S OPYD  IMPRESSIONS   1. Left ventricular ejection fraction, by estimation, is 40 to 45%. The left ventricle has mildly decreased function. The left ventricle demonstrates regional wall motion abnormalities (see scoring diagram/findings for description). Left ventricular diastolic parameters are consistent with Grade I diastolic dysfunction (impaired relaxation). 2. Right ventricular systolic function is mildly reduced. The right ventricular size is normal. Tricuspid regurgitation signal is inadequate for assessing PA  pressure. 3. A small pericardial effusion is present. The pericardial effusion is circumferential. There is no evidence of cardiac tamponade. Moderate pleural effusion in the left lateral region. 4. The mitral valve is grossly normal. Mild mitral valve regurgitation. No evidence of mitral stenosis. 5. The aortic valve is tricuspid. Aortic valve regurgitation is not visualized. No aortic stenosis is present. 6. The inferior vena cava is dilated in size with >50% respiratory variability, suggesting right atrial pressure of 8  mmHg.  FINDINGS Left Ventricle: Left ventricular ejection fraction, by estimation, is 40 to 45%. The left ventricle has mildly decreased function. The left ventricle demonstrates regional wall motion abnormalities. Definity  contrast agent was given IV to delineate the left ventricular endocardial borders. The left ventricular internal cavity size was normal in size. There is borderline left ventricular hypertrophy. Left ventricular diastolic parameters are consistent with Grade I diastolic dysfunction (impaired relaxation).   LV Wall Scoring: The mid and distal anterior septum, mid and distal inferior wall, mid inferoseptal segment, and apex are hypokinetic.  Right Ventricle: The right ventricular size is normal. No increase in right ventricular wall thickness. Right ventricular systolic function is mildly reduced. Tricuspid regurgitation signal is inadequate for assessing PA pressure.  Left Atrium: Left atrial size was normal in size.  Right Atrium: Right atrial size was normal in size.  Pericardium: A small pericardial effusion is present. The pericardial effusion is circumferential. There is no evidence of cardiac tamponade.  Mitral Valve: The mitral valve is grossly normal. Mild mitral valve regurgitation. No evidence of mitral valve stenosis. MV peak gradient, 7.0 mmHg. The mean mitral valve gradient is 3.0 mmHg.  Tricuspid Valve: The tricuspid valve is grossly normal. Tricuspid valve regurgitation is trivial. No evidence of tricuspid stenosis.  Aortic Valve: The aortic valve is tricuspid. Aortic valve regurgitation is not visualized. No aortic stenosis is present.  Pulmonic Valve: The pulmonic valve was grossly normal. Pulmonic valve regurgitation is not visualized. No evidence of pulmonic stenosis.  Aorta: The aortic root and ascending aorta are structurally normal, with no evidence of dilitation.  Venous: The inferior vena cava is dilated in size with greater than 50%  respiratory variability, suggesting right atrial pressure of 8 mmHg.  IAS/Shunts: The interatrial septum was not well visualized.  Additional Comments: There is a moderate pleural effusion in the left lateral region.   LEFT VENTRICLE PLAX 2D LVIDd:         4.05 cm      Diastology LVIDs:         3.10 cm      LV e' medial:    4.13 cm/s LV PW:         1.15 cm      LV E/e' medial:  19.0 LV IVS:        0.95 cm      LV e' lateral:   5.77 cm/s LVOT diam:     2.10 cm      LV E/e' lateral: 13.6 LV SV:         66 LV SV Index:   37 LVOT Area:     3.46 cm  LV Volumes (MOD) LV vol d, MOD A2C: 139.0 ml LV vol d, MOD A4C: 109.0 ml LV vol s, MOD A2C: 82.3 ml LV vol s, MOD A4C: 64.7 ml LV SV MOD A2C:     56.7 ml LV SV MOD A4C:     109.0 ml LV SV MOD BP:      51.4 ml  RIGHT  VENTRICLE            IVC RV S prime:     9.14 cm/s  IVC diam: 2.20 cm TAPSE (M-mode): 1.2 cm  LEFT ATRIUM             Index        RIGHT ATRIUM           Index LA diam:        4.00 cm 2.24 cm/m   RA Area:     14.00 cm LA Vol (A2C):   35.3 ml 19.78 ml/m  RA Volume:   33.40 ml  18.72 ml/m LA Vol (A4C):   43.5 ml 24.38 ml/m LA Biplane Vol: 38.6 ml 21.63 ml/m AORTIC VALVE LVOT Vmax:   90.60 cm/s LVOT Vmean:  61.600 cm/s LVOT VTI:    0.190 m  AORTA Ao Root diam: 3.00 cm Ao Asc diam:  3.30 cm  MITRAL VALVE MV Area (PHT): 4.15 cm     SHUNTS MV Area VTI:   2.32 cm     Systemic VTI:  0.19 m MV Peak grad:  7.0 mmHg     Systemic Diam: 2.10 cm MV Mean grad:  3.0 mmHg MV Vmax:       1.32 m/s MV Vmean:      77.6 cm/s MV Decel Time: 183 msec MV E velocity: 78.30 cm/s MV A velocity: 121.00 cm/s MV E/A ratio:  0.65  Sunit Tolia Electronically signed by Olinda Bertrand Signature Date/Time: 02/10/2024/6:19:18 PM    Final          ______________________________________________________________________________________________     Recent Labs: 02/09/2024: ALT 23; B Natriuretic Peptide 2,457.6; Magnesium   1.8 02/12/2024: BUN 25; Creatinine, Ser 1.40; Hemoglobin 11.0; Platelets 309; Potassium 4.3; Sodium 130  Recent Lipid Panel    Component Value Date/Time   CHOL 87 04/02/2022 1556   TRIG 150 (H) 04/02/2022 1556   HDL 16 (L) 04/02/2022 1556   CHOLHDL 5.4 04/02/2022 1556   VLDL 30 04/02/2022 1556   LDLCALC 41 04/02/2022 1556    History of Present Illness    73 year old female with the above past medical history including coronary artery calcification noted on prior CT, aortic atherosclerosis, chronic combined systolic and diastolic heart failure, mitral valve regurgitation, hypertension, type 2 diabetes, anemia, GERD, and necrotizing fasciitis s/p left AKA.  She was hospitalized in June 2023 in the setting of necrotizing fasciitis s/p left AKA.  Echocardiogram at the time showed EF 40 to 45%, mildly decreased LV function, severe asymmetric LVH of the basal septal segment, G1 DD, normal RV systolic function, no significant valvular abnormalities.  Repeat echocardiogram in 04/2023 showed EF 66%, normal left ventricular wall motion and thickness, G1 DD, no significant valvular abnormalities.  Torsemide  was discontinued in the setting of AKI. She was last seen in the office on 05/14/2023 and was stable from a cardiac standpoint.  She was started on rosuvastatin  20 mg daily.  BP was elevated above goal.  It was noted that should her BP remain elevated, BiDil could be considered at next office visit.  She was hospitalized in April 2025 in the setting of acute on chronic combined systolic and diastolic heart failure.  Echocardiogram in 01/2024 showed EF 40 to 45%, mildly decreased LV function, G1 DD, mildly reduced RV systolic function, small pericardial effusion, moderate pleural effusion, mild mitral valve regurgitation.  She was diuresed with IV Lasix  and transition to oral Lasix  and spironolactone .  Losartan  was held in the setting of borderline  hypotension.  She was also noted to have severe iron  deficiency  anemia, hemoglobin was 6.4, she was transfused 2 units PRBC.  She is pending outpatient GI evaluation.  She was discharged home in stable addition on 02/12/2024.  Her home health agency contacted our office on 02/19/2024 with concern for increased edema on right lower extremity from mid shin to foot.  She was encouraged to monitor daily weights, continue Lasix , and elevate her legs.  BNP and BMET were ordered but not completed.  She presents today for follow-up accompanied by her husband and cousin.  . Since her hospitalization she has been stable overall from a cardiac standpoint.  She has nonpitting right lower extremity edema.  She denies chest pain, dyspnea, PND, orthopnea, weight gain.  However, she does mention that she feels that her oxygen saturation may be dropping at night.  She has been using home O2 at night since her hospital discharge.  She has difficulty elevating her leg, as she sits in a wheelchair the majority of the time in the setting of left AKA.   Home Medications    Current Outpatient Medications  Medication Sig Dispense Refill   Ascorbic Acid  (VITAMIN C) 1000 MG tablet Take 1,000 mg by mouth daily.     Berberine Chloride 500 MG CAPS Take 1 capsule by mouth in the morning, at noon, and at bedtime.     dorzolamide -timolol  (COSOPT ) 22.3-6.8 MG/ML ophthalmic solution Place 1 drop into both eyes 2 (two) times daily. 10 mL 12   glucose blood (ACCU-CHEK GUIDE) test strip Use as instructed once a day 100 each 3   melatonin 3 MG TABS tablet Take 3 mg by mouth at bedtime.     pantoprazole  (PROTONIX ) 40 MG tablet Take 1 tablet (40 mg total) by mouth daily. 90 tablet 1   polyethylene glycol (MIRALAX  / GLYCOLAX ) 17 g packet Take 17 g by mouth daily. 14 each 0   furosemide  (LASIX ) 20 MG tablet Take Lasix  40 mg daily x 1 week. After that, take Lasix  20 mg daily. You may take an addition Lasix  20 mg daily (for a total of 40 mg) as needed for swelling. 60 tablet 3   metFORMIN (GLUCOPHAGE) 500 MG  tablet Take 500 mg by mouth in the morning and at bedtime. (Patient not taking: Reported on 02/10/2024)     rosuvastatin  (CRESTOR ) 20 MG tablet Take 1 tablet (20 mg total) by mouth at bedtime. 90 tablet 3   spironolactone  (ALDACTONE ) 25 MG tablet Take 1 tablet (25 mg total) by mouth daily. 90 tablet 3   No current facility-administered medications for this visit.     Review of Systems    She denies chest pain, palpitations, dyspnea, pnd, orthopnea, n, v, dizziness, syncope, weight gain, or early satiety. All other systems reviewed and are otherwise negative except as noted above.   Physical Exam    VS:  BP 138/72   Pulse 80   Ht 5\' 1"  (1.549 m)   SpO2 97%   BMI 20.87 kg/m   GEN: Well nourished, well developed, in no acute distress. HEENT: normal. Neck: Supple, no JVD, carotid bruits, or masses. Cardiac: RRR, no murmurs, rubs, or gallops. No clubbing, cyanosis, nonpitting right lower extremity edema.  Radials/DP/PT 2+ and equal bilaterally.  Respiratory:  Respirations regular and unlabored, clear to auscultation bilaterally. GI: Soft, nontender, nondistended, BS + x 4. MS: L AKA, otherwise, no deformity or atrophy. Skin: warm and dry, no rash. Neuro:  Strength and sensation are  intact. Psych: Normal affect.  Accessory Clinical Findings    ECG personally reviewed by me today - EKG Interpretation Date/Time:  Monday Mar 02 2024 14:22:25 EDT Ventricular Rate:  80 PR Interval:  176 QRS Duration:  74 QT Interval:  390 QTC Calculation: 449 R Axis:   114  Text Interpretation: Normal sinus rhythm Nonspecific ST and T wave abnormality Artifact No significant change since last tracing Confirmed by Marlana Silvan ([id number]) on 03/02/2024 5:00:32 PM  - no acute changes.   Lab Results  Component Value Date   WBC 8.1 02/12/2024   HGB 11.0 (L) 02/12/2024   HCT 34.1 (L) 02/12/2024   MCV 75.9 (L) 02/12/2024   PLT 309 02/12/2024   Lab Results  Component Value Date   CREATININE 1.40 (H)  02/12/2024   BUN 25 (H) 02/12/2024   NA 130 (L) 02/12/2024   K 4.3 02/12/2024   CL 90 (L) 02/12/2024   CO2 29 02/12/2024   Lab Results  Component Value Date   ALT 23 02/09/2024   AST 23 02/09/2024   ALKPHOS 86 02/09/2024   BILITOT 0.6 02/09/2024   Lab Results  Component Value Date   CHOL 87 04/02/2022   HDL 16 (L) 04/02/2022   LDLCALC 41 04/02/2022   TRIG 150 (H) 04/02/2022   CHOLHDL 5.4 04/02/2022    Lab Results  Component Value Date   HGBA1C 7.2 (H) 02/09/2024    Assessment & Plan    1. Chronic combined systolic and diastolic heart failure: She was hospitalized in April 2025 in the setting of acute on chronic combined systolic and diastolic heart failure.  Echocardiogram in 01/2024 showed EF 40 to 45%, mildly decreased LV function, G1 DD, mildly reduced RV systolic function, small pericardial effusion, moderate pleural effusion, mild mitral valve regurgitation.  She has nonpitting right lower extremity edema, otherwise well compensated on exam.  Recent creatinine was stable at 0.91 on 02/21/2024.  Will increase Lasix  to 40 mg daily x 1 week after which she will continue Lasix  20 mg daily, with an additional 20 mg daily as needed for swelling.  Will plan for repeat BMET in 1 week.  Continue spironolactone .  2. Coronary artery calcification/aortic atherosclerosis: CT angio chest in 2023 revealed advanced coronary vascular calcification, mild atherosclerotic calcification of the thoracic aorta. Stable with no anginal symptoms. No indication for ischemic evaluation.  Continue Crestor .   3. Mitral valve regurgitation: Mild on most recent echo.  Nonpitting right lower extremity edema as above.  Will increase Lasix  as above.  4. Hypertension: BP initially elevated in office today, improved with recheck.  Continue to monitor BP report BP is greater than 130/80.  Continue current antihypertensive regimen.   5. Type 2 diabetes: A1c was 7.2 in 01/2024.  Monitored and managed per PCP.  6.  Anemia: Hemoglobin dropped to 6.4 during recent hospitalization, she was transfused 2 units PRBC.  Hemoglobin was 11.0 on 02/21/2024.  Per hospital notes, outpatient GI evaluation was recommended, recommend follow-up with PCP.   7. Disposition: Follow-up in 1 month.      Jude Norton, NP 03/02/2024, 5:00 PM

## 2024-03-03 DIAGNOSIS — D72819 Decreased white blood cell count, unspecified: Secondary | ICD-10-CM | POA: Diagnosis not present

## 2024-03-03 DIAGNOSIS — J9601 Acute respiratory failure with hypoxia: Secondary | ICD-10-CM | POA: Diagnosis not present

## 2024-03-03 DIAGNOSIS — I081 Rheumatic disorders of both mitral and tricuspid valves: Secondary | ICD-10-CM | POA: Diagnosis not present

## 2024-03-03 DIAGNOSIS — I5023 Acute on chronic systolic (congestive) heart failure: Secondary | ICD-10-CM | POA: Diagnosis not present

## 2024-03-03 DIAGNOSIS — Z9981 Dependence on supplemental oxygen: Secondary | ICD-10-CM | POA: Diagnosis not present

## 2024-03-03 DIAGNOSIS — D649 Anemia, unspecified: Secondary | ICD-10-CM | POA: Diagnosis not present

## 2024-03-03 DIAGNOSIS — E1151 Type 2 diabetes mellitus with diabetic peripheral angiopathy without gangrene: Secondary | ICD-10-CM | POA: Diagnosis not present

## 2024-03-03 DIAGNOSIS — Z89612 Acquired absence of left leg above knee: Secondary | ICD-10-CM | POA: Diagnosis not present

## 2024-03-03 DIAGNOSIS — D509 Iron deficiency anemia, unspecified: Secondary | ICD-10-CM | POA: Diagnosis not present

## 2024-03-03 DIAGNOSIS — I5030 Unspecified diastolic (congestive) heart failure: Secondary | ICD-10-CM | POA: Diagnosis not present

## 2024-03-03 DIAGNOSIS — E871 Hypo-osmolality and hyponatremia: Secondary | ICD-10-CM | POA: Diagnosis not present

## 2024-03-05 ENCOUNTER — Other Ambulatory Visit: Payer: Self-pay | Admitting: Licensed Clinical Social Worker

## 2024-03-05 DIAGNOSIS — Z9981 Dependence on supplemental oxygen: Secondary | ICD-10-CM | POA: Diagnosis not present

## 2024-03-05 DIAGNOSIS — I5023 Acute on chronic systolic (congestive) heart failure: Secondary | ICD-10-CM | POA: Diagnosis not present

## 2024-03-05 DIAGNOSIS — I5030 Unspecified diastolic (congestive) heart failure: Secondary | ICD-10-CM | POA: Diagnosis not present

## 2024-03-05 DIAGNOSIS — D72819 Decreased white blood cell count, unspecified: Secondary | ICD-10-CM | POA: Diagnosis not present

## 2024-03-05 DIAGNOSIS — Z89612 Acquired absence of left leg above knee: Secondary | ICD-10-CM | POA: Diagnosis not present

## 2024-03-05 DIAGNOSIS — E871 Hypo-osmolality and hyponatremia: Secondary | ICD-10-CM | POA: Diagnosis not present

## 2024-03-05 DIAGNOSIS — D509 Iron deficiency anemia, unspecified: Secondary | ICD-10-CM | POA: Diagnosis not present

## 2024-03-05 DIAGNOSIS — E1151 Type 2 diabetes mellitus with diabetic peripheral angiopathy without gangrene: Secondary | ICD-10-CM | POA: Diagnosis not present

## 2024-03-05 DIAGNOSIS — D649 Anemia, unspecified: Secondary | ICD-10-CM | POA: Diagnosis not present

## 2024-03-05 DIAGNOSIS — J9601 Acute respiratory failure with hypoxia: Secondary | ICD-10-CM | POA: Diagnosis not present

## 2024-03-05 DIAGNOSIS — I081 Rheumatic disorders of both mitral and tricuspid valves: Secondary | ICD-10-CM | POA: Diagnosis not present

## 2024-03-05 NOTE — Patient Instructions (Signed)
 Visit Information  Thank you for taking time to visit with me today. Please don't hesitate to contact me if I can be of assistance to you before our next scheduled appointment.  Our next appointment is by telephone on 03/19/2024 . Please call the care guide team at [phone] if you need to cancel or reschedule your appointment.   Following is a copy of your care plan:   Goals Addressed             This Visit's Progress    [facility]BC[facility] Social Work Care Plan       Problems:   Lacks knowledge of how to connect   CSW Clinical Goal(s):   Over the next 6 weeks the Caregiver will will follow up with available community resources as directed by Social Work.  Interventions:  Social Determinants of Health in Patient with L AKA: SDOH assessments completed: Financial Strain  and Caregiver stress Evaluation of current treatment plan related to unmet needs PCS Application  Patient Goals/Self-Care Activities:  Continue taking your medication as prescribed.   Review Agricultural engineer on [employer] Application.  Plan:   Telephone follow up appointment with care management team member scheduled for:  03/19/2024        Please call the Suicide and Crisis Lifeline: 988 if you are experiencing a Mental Health or Behavioral Health Crisis or need someone to talk to.  Patient verbalizes understanding of instructions and care plan provided today and agrees to view in MyChart. Active MyChart status and patient understanding of how to access instructions and care plan via MyChart confirmed with patient.     Hale Level, LCSW [place], [facility] Licensed Clinical Social Worker Care Coordinator [phone]

## 2024-03-05 NOTE — Patient Outreach (Signed)
 Complex Care Management   Visit Note  03/05/2024  Name:  Jill Munoz MRN: [id number] DOB: [birth year]  Situation: Referral received for Complex Care Management related to caregiver stress I obtained verbal consent from Patient.  Visit completed with caregiver  on the phone  Background:   Past Medical History:  Diagnosis Date   Anemia    Diabetes mellitus without complication (HCC)    Pre-diabetes     Assessment: Patient Reported Symptoms:  Cognitive Cognitive Status: Alert and oriented to person, place, and time, Normal speech and language skills Cognitive/Intellectual Conditions Management [RPT]: None reported or documented in medical history or problem list   Health Maintenance Behaviors: Annual physical exam, Healthy diet, Stress management Healing Pattern: Unsure Health Facilitated by: Stress management  Neurological Neurological Review of Symptoms: No symptoms reported    HEENT HEENT Symptoms Reported: No symptoms reported      Cardiovascular Cardiovascular Symptoms Reported: Swelling in legs or feet, Dizziness Does patient have uncontrolled Hypertension?: Yes Is patient checking Blood Pressure at home?: No Cardiovascular Conditions: Hypertension, Heart failure Cardiovascular Management Strategies: Diet modification, Medication therapy Cardiovascular Self-Management Outcome: 3 (uncertain) Cardiovascular Comment: Managed by cardiology specialist  Respiratory Respiratory Symptoms Reported: Shortness of breath Other Respiratory Symptoms: Where's oxygen - addressed with provider at most recent visit - Respiratory Conditions: Shortness of breath Respiratory Self-Management Outcome: 3 (uncertain)  Endocrine Patient reports the following symptoms related to hypoglycemia or hyperglycemia : No symptoms reported Is patient diabetic?: Yes Is patient checking blood sugars at home?: Yes Endocrine Conditions: Diabetes Endocrine Management Strategies: Diet modification,  Medication therapy Endocrine Self-Management Outcome: 3 (uncertain)  Gastrointestinal Gastrointestinal Symptoms Reported: No symptoms reported      Genitourinary Genitourinary Symptoms Reported: No symptoms reported    Integumentary Integumentary Symptoms Reported: No symptoms reported    Musculoskeletal Musculoskelatal Symptoms Reviewed: Difficulty walking, Weakness Additional Musculoskeletal Details: Left AKA Musculoskeletal Conditions: Amputation, Fibromyalgia Musculoskeletal Management Strategies: Coping strategies, Medical device, Medication therapy Musculoskeletal Self-Management Outcome: 3 (uncertain) Musculoskeletal Comment: Currently participating in PT/OT Falls in the past year?: No Number of falls in past year: 1 or less Was there an injury with Fall?: No Fall Risk Category Calculator: 0 Patient Fall Risk Level: Low Fall Risk    Psychosocial Psychosocial Symptoms Reported: Not assessed     Quality of Family Relationships: helpful, involved, supportive Do you feel physically threatened by others?: No      10/14/2023    3:50 PM  Depression screen PHQ 2/9  Decreased Interest 0  Down, Depressed, Hopeless 0  PHQ - 2 Score 0    There were no vitals filed for this visit.  Medications Reviewed Today     Reviewed by Jens Molder, LCSW (Social Worker) on 03/05/24 at 1533  Med List Status: <None>   Medication Order Taking? Sig Documenting Provider Last Dose Status Informant  Ascorbic Acid  (VITAMIN C) 1000 MG tablet [id number] Yes Take 1,000 mg by mouth daily. [provider] Taking Active Spouse/Significant Other, Self, Pharmacy Records  Berberine Chloride 500 MG CAPS [id number] Yes Take 1 capsule by mouth in the morning, at noon, and at bedtime. [provider] Taking Active Spouse/Significant Other, Self, Pharmacy Records  dorzolamide -timolol  (COSOPT ) 22.3-6.8 MG/ML ophthalmic solution [id number] Yes Place 1 drop into both eyes 2 (two) times daily.  Cathey Clunes, MD Taking Active Spouse/Significant Other, Self, Pharmacy Records  furosemide  (LASIX ) 20 MG tablet [id number]  Take Lasix  40 mg daily x 1 week. After that, take Lasix  20 mg daily. You may take an  addition Lasix  20 mg daily (for a total of 40 mg) as needed for swelling. Jude Norton, NP  Active   glucose blood (ACCU-CHEK GUIDE) test strip [id number] Yes Use as instructed once a day Aida House, MD Taking Active Spouse/Significant Other, Self, Pharmacy Records  melatonin 3 MG TABS tablet [id number] Yes Take 3 mg by mouth at bedtime. [provider] Taking Active Spouse/Significant Other, Self, Pharmacy Records  metFORMIN (GLUCOPHAGE) 500 MG tablet [id number]  Take 500 mg by mouth in the morning and at bedtime.  Patient not taking: Reported on 02/10/2024   [provider]  Active Spouse/Significant Other, Self, Pharmacy Records  pantoprazole  (PROTONIX ) 40 MG tablet [id number] Yes Take 1 tablet (40 mg total) by mouth daily. Aida House, MD Taking Active Spouse/Significant Other, Self, Pharmacy Records  polyethylene glycol (MIRALAX  / GLYCOLAX ) 17 g packet [id number] Yes Take 17 g by mouth daily. Deforest Fast, MD Taking Active   rosuvastatin  (CRESTOR ) 20 MG tablet [id number]  Take 1 tablet (20 mg total) by mouth at bedtime. Jude Norton, NP  Active   spironolactone  (ALDACTONE ) 25 MG tablet [id number]  Take 1 tablet (25 mg total) by mouth daily. Jude Norton, NP  Active             Recommendation:   CSW will review PCS eligibility Consider all care options including placement in ALF  Follow Up Plan:   Telephone follow up appointment date/time:  03/19/2024  Hale Level, LCSW [place], [facility] Licensed Clinical Social Worker Care Coordinator [phone]

## 2024-03-06 ENCOUNTER — Other Ambulatory Visit: Payer: Self-pay | Admitting: Orthopedic Surgery

## 2024-03-06 ENCOUNTER — Telehealth: Payer: Self-pay | Admitting: Orthopedic Surgery

## 2024-03-06 MED ORDER — OXYCODONE-ACETAMINOPHEN 5-325 MG PO TABS: 1.0000 | ORAL_TABLET | Freq: Three times a day (TID) | ORAL | 0 refills | Status: DC | PRN

## 2024-03-06 NOTE — Telephone Encounter (Signed)
 Pt husband informed

## 2024-03-06 NOTE — Telephone Encounter (Signed)
 Pt's husband called stating that pt has been having on and off pain at her left AKA site since last night. He stated she has taken 2 500mg  tylenol  only. Asking for a pain medication. I offered an appt for Monday but he wants her to just have a pain reliever.

## 2024-03-10 DIAGNOSIS — I5030 Unspecified diastolic (congestive) heart failure: Secondary | ICD-10-CM | POA: Diagnosis not present

## 2024-03-10 DIAGNOSIS — I081 Rheumatic disorders of both mitral and tricuspid valves: Secondary | ICD-10-CM | POA: Diagnosis not present

## 2024-03-10 DIAGNOSIS — D509 Iron deficiency anemia, unspecified: Secondary | ICD-10-CM | POA: Diagnosis not present

## 2024-03-10 DIAGNOSIS — J9601 Acute respiratory failure with hypoxia: Secondary | ICD-10-CM | POA: Diagnosis not present

## 2024-03-10 DIAGNOSIS — I5023 Acute on chronic systolic (congestive) heart failure: Secondary | ICD-10-CM | POA: Diagnosis not present

## 2024-03-10 DIAGNOSIS — D649 Anemia, unspecified: Secondary | ICD-10-CM | POA: Diagnosis not present

## 2024-03-10 DIAGNOSIS — Z89612 Acquired absence of left leg above knee: Secondary | ICD-10-CM | POA: Diagnosis not present

## 2024-03-10 DIAGNOSIS — E871 Hypo-osmolality and hyponatremia: Secondary | ICD-10-CM | POA: Diagnosis not present

## 2024-03-10 DIAGNOSIS — D72819 Decreased white blood cell count, unspecified: Secondary | ICD-10-CM | POA: Diagnosis not present

## 2024-03-10 DIAGNOSIS — Z9981 Dependence on supplemental oxygen: Secondary | ICD-10-CM | POA: Diagnosis not present

## 2024-03-10 DIAGNOSIS — E1151 Type 2 diabetes mellitus with diabetic peripheral angiopathy without gangrene: Secondary | ICD-10-CM | POA: Diagnosis not present

## 2024-03-11 DIAGNOSIS — E871 Hypo-osmolality and hyponatremia: Secondary | ICD-10-CM | POA: Diagnosis not present

## 2024-03-11 DIAGNOSIS — D509 Iron deficiency anemia, unspecified: Secondary | ICD-10-CM | POA: Diagnosis not present

## 2024-03-11 DIAGNOSIS — D649 Anemia, unspecified: Secondary | ICD-10-CM | POA: Diagnosis not present

## 2024-03-11 DIAGNOSIS — Z89612 Acquired absence of left leg above knee: Secondary | ICD-10-CM | POA: Diagnosis not present

## 2024-03-11 DIAGNOSIS — I5023 Acute on chronic systolic (congestive) heart failure: Secondary | ICD-10-CM | POA: Diagnosis not present

## 2024-03-11 DIAGNOSIS — D72819 Decreased white blood cell count, unspecified: Secondary | ICD-10-CM | POA: Diagnosis not present

## 2024-03-11 DIAGNOSIS — J9601 Acute respiratory failure with hypoxia: Secondary | ICD-10-CM | POA: Diagnosis not present

## 2024-03-11 DIAGNOSIS — I5030 Unspecified diastolic (congestive) heart failure: Secondary | ICD-10-CM | POA: Diagnosis not present

## 2024-03-11 DIAGNOSIS — E1151 Type 2 diabetes mellitus with diabetic peripheral angiopathy without gangrene: Secondary | ICD-10-CM | POA: Diagnosis not present

## 2024-03-11 DIAGNOSIS — Z9981 Dependence on supplemental oxygen: Secondary | ICD-10-CM | POA: Diagnosis not present

## 2024-03-11 DIAGNOSIS — I081 Rheumatic disorders of both mitral and tricuspid valves: Secondary | ICD-10-CM | POA: Diagnosis not present

## 2024-03-13 DIAGNOSIS — E113513 Type 2 diabetes mellitus with proliferative diabetic retinopathy with macular edema, bilateral: Secondary | ICD-10-CM | POA: Diagnosis not present

## 2024-03-13 DIAGNOSIS — Z794 Long term (current) use of insulin: Secondary | ICD-10-CM | POA: Diagnosis not present

## 2024-03-14 DIAGNOSIS — Z89612 Acquired absence of left leg above knee: Secondary | ICD-10-CM | POA: Diagnosis not present

## 2024-03-14 DIAGNOSIS — D509 Iron deficiency anemia, unspecified: Secondary | ICD-10-CM | POA: Diagnosis not present

## 2024-03-14 DIAGNOSIS — E871 Hypo-osmolality and hyponatremia: Secondary | ICD-10-CM | POA: Diagnosis not present

## 2024-03-14 DIAGNOSIS — I081 Rheumatic disorders of both mitral and tricuspid valves: Secondary | ICD-10-CM | POA: Diagnosis not present

## 2024-03-14 DIAGNOSIS — D649 Anemia, unspecified: Secondary | ICD-10-CM | POA: Diagnosis not present

## 2024-03-14 DIAGNOSIS — E1151 Type 2 diabetes mellitus with diabetic peripheral angiopathy without gangrene: Secondary | ICD-10-CM | POA: Diagnosis not present

## 2024-03-14 DIAGNOSIS — D72819 Decreased white blood cell count, unspecified: Secondary | ICD-10-CM | POA: Diagnosis not present

## 2024-03-14 DIAGNOSIS — I5023 Acute on chronic systolic (congestive) heart failure: Secondary | ICD-10-CM | POA: Diagnosis not present

## 2024-03-14 DIAGNOSIS — J9601 Acute respiratory failure with hypoxia: Secondary | ICD-10-CM | POA: Diagnosis not present

## 2024-03-14 DIAGNOSIS — I5030 Unspecified diastolic (congestive) heart failure: Secondary | ICD-10-CM | POA: Diagnosis not present

## 2024-03-14 DIAGNOSIS — Z9981 Dependence on supplemental oxygen: Secondary | ICD-10-CM | POA: Diagnosis not present

## 2024-03-17 DIAGNOSIS — I081 Rheumatic disorders of both mitral and tricuspid valves: Secondary | ICD-10-CM | POA: Diagnosis not present

## 2024-03-17 DIAGNOSIS — Z89612 Acquired absence of left leg above knee: Secondary | ICD-10-CM | POA: Diagnosis not present

## 2024-03-17 DIAGNOSIS — D509 Iron deficiency anemia, unspecified: Secondary | ICD-10-CM | POA: Diagnosis not present

## 2024-03-17 DIAGNOSIS — I5023 Acute on chronic systolic (congestive) heart failure: Secondary | ICD-10-CM | POA: Diagnosis not present

## 2024-03-17 DIAGNOSIS — Z9981 Dependence on supplemental oxygen: Secondary | ICD-10-CM | POA: Diagnosis not present

## 2024-03-17 DIAGNOSIS — D649 Anemia, unspecified: Secondary | ICD-10-CM | POA: Diagnosis not present

## 2024-03-17 DIAGNOSIS — I5030 Unspecified diastolic (congestive) heart failure: Secondary | ICD-10-CM | POA: Diagnosis not present

## 2024-03-17 DIAGNOSIS — E871 Hypo-osmolality and hyponatremia: Secondary | ICD-10-CM | POA: Diagnosis not present

## 2024-03-17 DIAGNOSIS — J9601 Acute respiratory failure with hypoxia: Secondary | ICD-10-CM | POA: Diagnosis not present

## 2024-03-17 DIAGNOSIS — E1151 Type 2 diabetes mellitus with diabetic peripheral angiopathy without gangrene: Secondary | ICD-10-CM | POA: Diagnosis not present

## 2024-03-17 DIAGNOSIS — D72819 Decreased white blood cell count, unspecified: Secondary | ICD-10-CM | POA: Diagnosis not present

## 2024-03-19 ENCOUNTER — Other Ambulatory Visit: Payer: Self-pay | Admitting: Licensed Clinical Social Worker

## 2024-03-19 DIAGNOSIS — D72819 Decreased white blood cell count, unspecified: Secondary | ICD-10-CM | POA: Diagnosis not present

## 2024-03-19 DIAGNOSIS — D649 Anemia, unspecified: Secondary | ICD-10-CM | POA: Diagnosis not present

## 2024-03-19 DIAGNOSIS — E871 Hypo-osmolality and hyponatremia: Secondary | ICD-10-CM | POA: Diagnosis not present

## 2024-03-19 DIAGNOSIS — Z89612 Acquired absence of left leg above knee: Secondary | ICD-10-CM | POA: Diagnosis not present

## 2024-03-19 DIAGNOSIS — I5023 Acute on chronic systolic (congestive) heart failure: Secondary | ICD-10-CM | POA: Diagnosis not present

## 2024-03-19 DIAGNOSIS — Z9981 Dependence on supplemental oxygen: Secondary | ICD-10-CM | POA: Diagnosis not present

## 2024-03-19 DIAGNOSIS — I5030 Unspecified diastolic (congestive) heart failure: Secondary | ICD-10-CM | POA: Diagnosis not present

## 2024-03-19 DIAGNOSIS — I081 Rheumatic disorders of both mitral and tricuspid valves: Secondary | ICD-10-CM | POA: Diagnosis not present

## 2024-03-19 DIAGNOSIS — D509 Iron deficiency anemia, unspecified: Secondary | ICD-10-CM | POA: Diagnosis not present

## 2024-03-19 DIAGNOSIS — J9601 Acute respiratory failure with hypoxia: Secondary | ICD-10-CM | POA: Diagnosis not present

## 2024-03-19 DIAGNOSIS — E1151 Type 2 diabetes mellitus with diabetic peripheral angiopathy without gangrene: Secondary | ICD-10-CM | POA: Diagnosis not present

## 2024-03-19 NOTE — Patient Instructions (Signed)
 Visit Information  Thank you for taking time to visit with me today. Please don't hesitate to contact me if I can be of assistance to you before our next scheduled appointment.  Your next care management appointment is by telephone on 04/09/2024.   Please call the care guide team at [phone] if you need to cancel, schedule, or reschedule an appointment.   Please call the Suicide and Crisis Lifeline: 988 if you are experiencing a Mental Health or Behavioral Health Crisis or need someone to talk to.  Hale Level, LCSW [place], [facility] Licensed Clinical Social Worker Care Coordinator [phone]

## 2024-03-19 NOTE — Patient Outreach (Signed)
 Complex Care Management   Visit Note  03/19/2024  Name:  Jill Munoz MRN: [id number] DOB: 05-Feb-1951  Situation: Referral received for Complex Care Management related to AKA I obtained verbal consent from Caregiver.  Visit completed with caregiver  on the phone  Background:   Past Medical History:  Diagnosis Date   Anemia    Diabetes mellitus without complication (HCC)    Pre-diabetes     Assessment: Patient Reported Symptoms:  Cognitive Cognitive Status: Alert and oriented to person, place, and time, Normal speech and language skills Cognitive/Intellectual Conditions Management [RPT]: None reported or documented in medical history or problem list   Health Maintenance Behaviors: Annual physical exam, Healthy diet, Stress management Healing Pattern: Unsure Health Facilitated by: Stress management  Neurological Neurological Review of Symptoms: Not assessed    HEENT HEENT Symptoms Reported: Not assessed      Cardiovascular Cardiovascular Symptoms Reported: Not assessed    Respiratory Respiratory Symptoms Reported: Not assesed    Endocrine Patient reports the following symptoms related to hypoglycemia or hyperglycemia : Not assessed    Gastrointestinal Gastrointestinal Symptoms Reported: Not assessed      Genitourinary Genitourinary Symptoms Reported: Not assessed    Integumentary Integumentary Symptoms Reported: Not assessed    Musculoskeletal Musculoskelatal Symptoms Reviewed: Difficulty walking, Weakness Additional Musculoskeletal Details: Left AKA, Spouse reports increased debilitation overall - would like to keep her in the home. Agreed to requesting [facility] referral for Pt/OT Musculoskeletal Conditions: Amputation, Fibromyalgia Musculoskeletal Management Strategies: Medical device, Coping strategies, Medication therapy Musculoskeletal Self-Management Outcome: 3 (uncertain) Falls in the past year?: No Number of falls in past year: 1 or less Was there an injury with  Fall?: No Fall Risk Category Calculator: 0 Patient Fall Risk Level: Low Fall Risk    Psychosocial Psychosocial Symptoms Reported: Not assessed            10/14/2023    3:50 PM  Depression screen PHQ 2/9  Decreased Interest 0  Down, Depressed, Hopeless 0  PHQ - 2 Score 0    There were no vitals filed for this visit.  Medications Reviewed Today     Reviewed by Jens Molder, LCSW (Social Worker) on 03/19/24 at 1021  Med List Status: <None>   Medication Order Taking? Sig Documenting Provider Last Dose Status Informant  Ascorbic Acid  (VITAMIN C) 1000 MG tablet [id number] No Take 1,000 mg by mouth daily. [provider] Taking Active Spouse/Significant Other, Self, Pharmacy Records  Berberine Chloride 500 MG CAPS [id number] No Take 1 capsule by mouth in the morning, at noon, and at bedtime. [provider] Taking Active Spouse/Significant Other, Self, Pharmacy Records  dorzolamide -timolol  (COSOPT ) 22.3-6.8 MG/ML ophthalmic solution [id number] No Place 1 drop into both eyes 2 (two) times daily. Cathey Clunes, MD Taking Active Spouse/Significant Other, Self, Pharmacy Records  furosemide  (LASIX ) 20 MG tablet [id number]  Take Lasix  40 mg daily x 1 week. After that, take Lasix  20 mg daily. You may take an addition Lasix  20 mg daily (for a total of 40 mg) as needed for swelling. Jude Norton, NP  Active   glucose blood (ACCU-CHEK GUIDE) test strip [id number] No Use as instructed once a day Aida House, MD Taking Active Spouse/Significant Other, Self, Pharmacy Records  melatonin 3 MG TABS tablet [id number] No Take 3 mg by mouth at bedtime. [provider] Taking Active Spouse/Significant Other, Self, Pharmacy Records  metFORMIN (GLUCOPHAGE) 500 MG tablet [id number] No Take 500 mg by mouth in the morning and at bedtime.  Patient not taking: Reported on 02/10/2024   [provider] Not Taking Active Spouse/Significant Other, Self, Pharmacy Records   oxyCODONE -acetaminophen  (PERCOCET/ROXICET) 5-325 MG tablet [id number]  Take 1 tablet by mouth every 8 (eight) hours as needed. Timothy Ford, MD  Active   pantoprazole  (PROTONIX ) 40 MG tablet [id number] No Take 1 tablet (40 mg total) by mouth daily. Aida House, MD Taking Active Spouse/Significant Other, Self, Pharmacy Records  polyethylene glycol (MIRALAX  / GLYCOLAX ) 17 g packet [id number] No Take 17 g by mouth daily. Deforest Fast, MD Taking Active   rosuvastatin  (CRESTOR ) 20 MG tablet [id number]  Take 1 tablet (20 mg total) by mouth at bedtime. Jude Norton, NP  Active   spironolactone  (ALDACTONE ) 25 MG tablet [id number]  Take 1 tablet (25 mg total) by mouth daily. Jude Norton, NP  Active             Recommendation:   Pt does not qualify for PCS due to having wrong type of Medicaid. CSW discussed options with pt and spouse - pt would like to stay in the home though she feels she is becoming more debilitated. Pt was agreeable to home health referral for pt/ot, CSW will contact provider office to request referral.   Follow Up Plan:   Face to Face appointment date/time: 04/09/2024  Hale Level, LCSW [place], [facility] Licensed Clinical Social Worker Care Coordinator [phone]

## 2024-03-24 DIAGNOSIS — Z9981 Dependence on supplemental oxygen: Secondary | ICD-10-CM | POA: Diagnosis not present

## 2024-03-24 DIAGNOSIS — D509 Iron deficiency anemia, unspecified: Secondary | ICD-10-CM | POA: Diagnosis not present

## 2024-03-24 DIAGNOSIS — E871 Hypo-osmolality and hyponatremia: Secondary | ICD-10-CM | POA: Diagnosis not present

## 2024-03-24 DIAGNOSIS — D72819 Decreased white blood cell count, unspecified: Secondary | ICD-10-CM | POA: Diagnosis not present

## 2024-03-24 DIAGNOSIS — I081 Rheumatic disorders of both mitral and tricuspid valves: Secondary | ICD-10-CM | POA: Diagnosis not present

## 2024-03-24 DIAGNOSIS — Z89612 Acquired absence of left leg above knee: Secondary | ICD-10-CM | POA: Diagnosis not present

## 2024-03-24 DIAGNOSIS — D649 Anemia, unspecified: Secondary | ICD-10-CM | POA: Diagnosis not present

## 2024-03-24 DIAGNOSIS — J9601 Acute respiratory failure with hypoxia: Secondary | ICD-10-CM | POA: Diagnosis not present

## 2024-03-24 DIAGNOSIS — I5030 Unspecified diastolic (congestive) heart failure: Secondary | ICD-10-CM | POA: Diagnosis not present

## 2024-03-24 DIAGNOSIS — I5023 Acute on chronic systolic (congestive) heart failure: Secondary | ICD-10-CM | POA: Diagnosis not present

## 2024-03-24 DIAGNOSIS — E1151 Type 2 diabetes mellitus with diabetic peripheral angiopathy without gangrene: Secondary | ICD-10-CM | POA: Diagnosis not present

## 2024-04-03 DIAGNOSIS — E1151 Type 2 diabetes mellitus with diabetic peripheral angiopathy without gangrene: Secondary | ICD-10-CM | POA: Diagnosis not present

## 2024-04-03 DIAGNOSIS — E871 Hypo-osmolality and hyponatremia: Secondary | ICD-10-CM | POA: Diagnosis not present

## 2024-04-03 DIAGNOSIS — I5030 Unspecified diastolic (congestive) heart failure: Secondary | ICD-10-CM | POA: Diagnosis not present

## 2024-04-03 DIAGNOSIS — D72819 Decreased white blood cell count, unspecified: Secondary | ICD-10-CM | POA: Diagnosis not present

## 2024-04-03 DIAGNOSIS — D649 Anemia, unspecified: Secondary | ICD-10-CM | POA: Diagnosis not present

## 2024-04-03 DIAGNOSIS — J9601 Acute respiratory failure with hypoxia: Secondary | ICD-10-CM | POA: Diagnosis not present

## 2024-04-03 DIAGNOSIS — I5023 Acute on chronic systolic (congestive) heart failure: Secondary | ICD-10-CM | POA: Diagnosis not present

## 2024-04-03 DIAGNOSIS — Z89612 Acquired absence of left leg above knee: Secondary | ICD-10-CM | POA: Diagnosis not present

## 2024-04-03 DIAGNOSIS — D509 Iron deficiency anemia, unspecified: Secondary | ICD-10-CM | POA: Diagnosis not present

## 2024-04-03 DIAGNOSIS — I081 Rheumatic disorders of both mitral and tricuspid valves: Secondary | ICD-10-CM | POA: Diagnosis not present

## 2024-04-03 DIAGNOSIS — Z9981 Dependence on supplemental oxygen: Secondary | ICD-10-CM | POA: Diagnosis not present

## 2024-04-14 ENCOUNTER — Encounter: Payer: Self-pay | Admitting: Nurse Practitioner

## 2024-04-14 ENCOUNTER — Ambulatory Visit: Attending: Cardiology | Admitting: Nurse Practitioner

## 2024-04-14 VITALS — BP 132/82 | HR 80 | Ht 61.0 in | Wt 105.0 lb

## 2024-04-14 DIAGNOSIS — I700 Atherosclerosis of aorta: Secondary | ICD-10-CM | POA: Diagnosis not present

## 2024-04-14 DIAGNOSIS — D638 Anemia in other chronic diseases classified elsewhere: Secondary | ICD-10-CM | POA: Diagnosis not present

## 2024-04-14 DIAGNOSIS — I2510 Atherosclerotic heart disease of native coronary artery without angina pectoris: Secondary | ICD-10-CM | POA: Diagnosis not present

## 2024-04-14 DIAGNOSIS — E119 Type 2 diabetes mellitus without complications: Secondary | ICD-10-CM

## 2024-04-14 DIAGNOSIS — I5042 Chronic combined systolic (congestive) and diastolic (congestive) heart failure: Secondary | ICD-10-CM

## 2024-04-14 DIAGNOSIS — I340 Nonrheumatic mitral (valve) insufficiency: Secondary | ICD-10-CM

## 2024-04-14 DIAGNOSIS — I10 Essential (primary) hypertension: Secondary | ICD-10-CM | POA: Diagnosis not present

## 2024-04-14 NOTE — Progress Notes (Signed)
 Office Visit    Patient Name: Jill Munoz Date of Encounter: 04/14/2024  Primary Care Provider:  Ransom Other, MD Primary Cardiologist:  Madonna Large, DO  Chief Complaint    73 year old female with a history of coronary artery calcification noted on prior CT, aortic atherosclerosis, chronic combined systolic and diastolic heart failure, mitral valve regurgitation, hypertension, type 2 diabetes, anemia, GERD, and necrotizing fasciitis s/p left AKA who presents for follow-up related to heart failure.   Past Medical History    Past Medical History:  Diagnosis Date   Anemia    Diabetes mellitus without complication (HCC)    Pre-diabetes    Past Surgical History:  Procedure Laterality Date   AMPUTATION  04/06/2022   Procedure: AMPUTATION ABOVE KNEE REVISION;  Surgeon: Harden Jerona GAILS, MD;  Location: [facility] OR;  Service: Orthopedics;;   I & D EXTREMITY Left 04/02/2022   Procedure: IRRIGATION AND DEBRIDEMENT LEFT LOWER LEG APPLICATION OF WOUND VAC ;  Surgeon: Doll Skates, MD;  Location: [facility] OR;  Service: Orthopedics;  Laterality: Left;   I & D EXTREMITY Left 04/04/2022   Procedure: LEFT ABOVE KNEE AMPUTATION;  Surgeon: Harden Jerona GAILS, MD;  Location: [facility] OR;  Service: Orthopedics;  Laterality: Left;   I & D EXTREMITY Left 04/06/2022   Procedure: LEFT LEG DEBRIDEMENT;  Surgeon: Harden Jerona GAILS, MD;  Location: [facility] OR;  Service: Orthopedics;  Laterality: Left;   STUMP REVISION Left 04/11/2022   Procedure: REVISION LEFT ABOVE KNEE AMPUTATION;  Surgeon: Harden Jerona GAILS, MD;  Location: [facility] OR;  Service: Orthopedics;  Laterality: Left;    Allergies  No Known Allergies   Labs/Other Studies Reviewed    The following studies were reviewed today:  Cardiac Studies & Procedures   ______________________________________________________________________________________________     ECHOCARDIOGRAM  ECHOCARDIOGRAM COMPLETE 02/10/2024  Narrative ECHOCARDIOGRAM REPORT    Patient Name:   Jill Munoz Date of Exam: 02/10/2024 Medical Rec #:  [id number]       Height:       61.0 in Accession #:    [id number]      Weight:       175.0 lb Date of Birth:  [birth year]        BSA:          1.785 m Patient Age:    72 years        BP:           125/56 mmHg Patient Gender: F               HR:           75 bpm. Exam Location:  Inpatient  Procedure: 2D Echo, Cardiac Doppler, Color Doppler and Intracardiac Opacification Agent (Both Spectral and Color Flow Doppler were utilized during procedure).  Indications:    I50.40* Unspecified combined systolic (congestive) and diastolic (congestive) heart failure  History:        Patient has prior history of Echocardiogram examinations, most recent 04/03/2022. CHF, Signs/Symptoms:Shortness of Breath and Bacteremia; Risk Factors:Diabetes.  Sonographer:    Ellouise Mose RDCS Referring Phys: [id number] TIMOTHY S OPYD  IMPRESSIONS   1. Left ventricular ejection fraction, by estimation, is 40 to 45%. The left ventricle has mildly decreased function. The left ventricle demonstrates regional wall motion abnormalities (see scoring diagram/findings for description). Left ventricular diastolic parameters are consistent with Grade I diastolic dysfunction (impaired relaxation). 2. Right ventricular systolic function is mildly reduced. The right ventricular size is normal. Tricuspid regurgitation signal is inadequate for assessing  PA pressure. 3. A small pericardial effusion is present. The pericardial effusion is circumferential. There is no evidence of cardiac tamponade. Moderate pleural effusion in the left lateral region. 4. The mitral valve is grossly normal. Mild mitral valve regurgitation. No evidence of mitral stenosis. 5. The aortic valve is tricuspid. Aortic valve regurgitation is not visualized. No aortic stenosis is present. 6. The inferior vena cava is dilated in size with >50% respiratory variability, suggesting right atrial pressure of 8  mmHg.  FINDINGS Left Ventricle: Left ventricular ejection fraction, by estimation, is 40 to 45%. The left ventricle has mildly decreased function. The left ventricle demonstrates regional wall motion abnormalities. Definity  contrast agent was given IV to delineate the left ventricular endocardial borders. The left ventricular internal cavity size was normal in size. There is borderline left ventricular hypertrophy. Left ventricular diastolic parameters are consistent with Grade I diastolic dysfunction (impaired relaxation).   LV Wall Scoring: The mid and distal anterior septum, mid and distal inferior wall, mid inferoseptal segment, and apex are hypokinetic.  Right Ventricle: The right ventricular size is normal. No increase in right ventricular wall thickness. Right ventricular systolic function is mildly reduced. Tricuspid regurgitation signal is inadequate for assessing PA pressure.  Left Atrium: Left atrial size was normal in size.  Right Atrium: Right atrial size was normal in size.  Pericardium: A small pericardial effusion is present. The pericardial effusion is circumferential. There is no evidence of cardiac tamponade.  Mitral Valve: The mitral valve is grossly normal. Mild mitral valve regurgitation. No evidence of mitral valve stenosis. MV peak gradient, 7.0 mmHg. The mean mitral valve gradient is 3.0 mmHg.  Tricuspid Valve: The tricuspid valve is grossly normal. Tricuspid valve regurgitation is trivial. No evidence of tricuspid stenosis.  Aortic Valve: The aortic valve is tricuspid. Aortic valve regurgitation is not visualized. No aortic stenosis is present.  Pulmonic Valve: The pulmonic valve was grossly normal. Pulmonic valve regurgitation is not visualized. No evidence of pulmonic stenosis.  Aorta: The aortic root and ascending aorta are structurally normal, with no evidence of dilitation.  Venous: The inferior vena cava is dilated in size with greater than 50%  respiratory variability, suggesting right atrial pressure of 8 mmHg.  IAS/Shunts: The interatrial septum was not well visualized.  Additional Comments: There is a moderate pleural effusion in the left lateral region.   LEFT VENTRICLE PLAX 2D LVIDd:         4.05 cm      Diastology LVIDs:         3.10 cm      LV e' medial:    4.13 cm/s LV PW:         1.15 cm      LV E/e' medial:  19.0 LV IVS:        0.95 cm      LV e' lateral:   5.77 cm/s LVOT diam:     2.10 cm      LV E/e' lateral: 13.6 LV SV:         66 LV SV Index:   37 LVOT Area:     3.46 cm  LV Volumes (MOD) LV vol d, MOD A2C: 139.0 ml LV vol d, MOD A4C: 109.0 ml LV vol s, MOD A2C: 82.3 ml LV vol s, MOD A4C: 64.7 ml LV SV MOD A2C:     56.7 ml LV SV MOD A4C:     109.0 ml LV SV MOD BP:      51.4 ml  RIGHT VENTRICLE            IVC RV S prime:     9.14 cm/s  IVC diam: 2.20 cm TAPSE (M-mode): 1.2 cm  LEFT ATRIUM             Index        RIGHT ATRIUM           Index LA diam:        4.00 cm 2.24 cm/m   RA Area:     14.00 cm LA Vol (A2C):   35.3 ml 19.78 ml/m  RA Volume:   33.40 ml  18.72 ml/m LA Vol (A4C):   43.5 ml 24.38 ml/m LA Biplane Vol: 38.6 ml 21.63 ml/m AORTIC VALVE LVOT Vmax:   90.60 cm/s LVOT Vmean:  61.600 cm/s LVOT VTI:    0.190 m  AORTA Ao Root diam: 3.00 cm Ao Asc diam:  3.30 cm  MITRAL VALVE MV Area (PHT): 4.15 cm     SHUNTS MV Area VTI:   2.32 cm     Systemic VTI:  0.19 m MV Peak grad:  7.0 mmHg     Systemic Diam: 2.10 cm MV Mean grad:  3.0 mmHg MV Vmax:       1.32 m/s MV Vmean:      77.6 cm/s MV Decel Time: 183 msec MV E velocity: 78.30 cm/s MV A velocity: 121.00 cm/s MV E/A ratio:  0.65  Sunit Tolia Electronically signed by Madonna Large Signature Date/Time: 02/10/2024/6:19:18 PM    Final          ______________________________________________________________________________________________     Recent Labs: 02/09/2024: ALT 23; B Natriuretic Peptide 2,457.6; Magnesium   1.8 02/12/2024: BUN 25; Creatinine, Ser 1.40; Hemoglobin 11.0; Platelets 309; Potassium 4.3; Sodium 130  Recent Lipid Panel    Component Value Date/Time   CHOL 87 04/02/2022 1556   TRIG 150 (H) 04/02/2022 1556   HDL 16 (L) 04/02/2022 1556   CHOLHDL 5.4 04/02/2022 1556   VLDL 30 04/02/2022 1556   LDLCALC 41 04/02/2022 1556    History of Present Illness    73 year old female with the above past medical history including coronary artery calcification noted on prior CT, aortic atherosclerosis, chronic combined systolic and diastolic heart failure, mitral valve regurgitation, hypertension, type 2 diabetes, anemia, GERD, and necrotizing fasciitis s/p left AKA.   She was hospitalized in June 2023 in the setting of necrotizing fasciitis s/p left AKA.  Echocardiogram at the time showed EF 40 to 45%, mildly decreased LV function, severe asymmetric LVH of the basal septal segment, G1 DD, normal RV systolic function, no significant valvular abnormalities.  Repeat echocardiogram in 04/2023 showed EF 66%, normal left ventricular wall motion and thickness, G1 DD, no significant valvular abnormalities.  Torsemide  was discontinued in the setting of AKI. She was hospitalized in April 2025 in the setting of acute on chronic combined systolic and diastolic heart failure.  Echocardiogram in 01/2024 showed EF 40 to 45%, mildly decreased LV function, G1 DD, mildly reduced RV systolic function, small pericardial effusion, moderate pleural effusion, mild mitral valve regurgitation.  She was diuresed with IV Lasix  and transitioned to oral Lasix  and spironolactone .  Losartan  was held in the setting of borderline hypotension.  She was also noted to have severe iron  deficiency anemia, hemoglobin was 6.4, she was transfused 2 units PRBC.  Outpatient follow-up with GI was recommended.  She was last seen in the office on 03/02/2024 and reported nonpitting right lower extremity edema.  Lasix  was  increased to 40 mg daily x 1 week  followed by Lasix  20 mg daily.   She presents today for follow-up accompanied by her husband and cousin.  Since her last visit she has been stable from a cardiac standpoint.  She continues to have intermittent nonpitting right lower extremity edema.  She denies any chest pain, palpitations, dizziness, dyspnea, PND, orthopnea, weight gain.  Her swelling did improve somewhat with increased Lasix  dosing, but has since returned.  Overall, she reports feeling well.  Home Medications    Current Outpatient Medications  Medication Sig Dispense Refill   Ascorbic Acid  (VITAMIN C) 1000 MG tablet Take 1,000 mg by mouth daily.     Berberine Chloride 500 MG CAPS Take 1 capsule by mouth in the morning, at noon, and at bedtime.     dorzolamide -timolol  (COSOPT ) 22.3-6.8 MG/ML ophthalmic solution Place 1 drop into both eyes 2 (two) times daily. 10 mL 12   furosemide  (LASIX ) 20 MG tablet Take Lasix  40 mg daily x 1 week. After that, take Lasix  20 mg daily. You may take an addition Lasix  20 mg daily (for a total of 40 mg) as needed for swelling. 60 tablet 3   glucose blood (ACCU-CHEK GUIDE) test strip Use as instructed once a day 100 each 3   melatonin 3 MG TABS tablet Take 3 mg by mouth at bedtime.     metFORMIN (GLUCOPHAGE) 500 MG tablet Take 500 mg by mouth in the morning and at bedtime.     oxyCODONE -acetaminophen  (PERCOCET/ROXICET) 5-325 MG tablet Take 1 tablet by mouth every 8 (eight) hours as needed. 20 tablet 0   pantoprazole  (PROTONIX ) 40 MG tablet Take 1 tablet (40 mg total) by mouth daily. 90 tablet 1   polyethylene glycol (MIRALAX  / GLYCOLAX ) 17 g packet Take 17 g by mouth daily. 14 each 0   rosuvastatin  (CRESTOR ) 20 MG tablet Take 1 tablet (20 mg total) by mouth at bedtime. 90 tablet 3   spironolactone  (ALDACTONE ) 25 MG tablet Take 1 tablet (25 mg total) by mouth daily. 90 tablet 3   No current facility-administered medications for this visit.     Review of Systems    She denies chest pain,  palpitations, dyspnea, pnd, orthopnea, n, v, dizziness, syncope, weight gain, or early satiety. All other systems reviewed and are otherwise negative except as noted above.   Physical Exam    VS:  BP 132/82   Pulse 80   Ht 5' 1 (1.549 m)   Wt 105 lb (47.6 kg)   SpO2 98%   BMI 19.84 kg/m  GEN: Well nourished, well developed, in no acute distress. HEENT: normal. Neck: Supple, no JVD, carotid bruits, or masses. Cardiac: RRR, no murmurs, rubs, or gallops. No clubbing, cyanosis, nonpitting RLE edema.  Radials/DP/PT 2+ and equal bilaterally.  Respiratory:  Respirations regular and unlabored, clear to auscultation bilaterally. GI: Soft, nontender, nondistended, BS + x 4. MS: no deformity or atrophy. Skin: warm and dry, no rash. Neuro:  Strength and sensation are intact. Psych: Normal affect.  Accessory Clinical Findings    ECG personally reviewed by me today -    - no acute changes.   Lab Results  Component Value Date   WBC 8.1 02/12/2024   HGB 11.0 (L) 02/12/2024   HCT 34.1 (L) 02/12/2024   MCV 75.9 (L) 02/12/2024   PLT 309 02/12/2024   Lab Results  Component Value Date   CREATININE 1.40 (H) 02/12/2024   BUN 25 (H) 02/12/2024   NA  130 (L) 02/12/2024   K 4.3 02/12/2024   CL 90 (L) 02/12/2024   CO2 29 02/12/2024   Lab Results  Component Value Date   ALT 23 02/09/2024   AST 23 02/09/2024   ALKPHOS 86 02/09/2024   BILITOT 0.6 02/09/2024   Lab Results  Component Value Date   CHOL 87 04/02/2022   HDL 16 (L) 04/02/2022   LDLCALC 41 04/02/2022   TRIG 150 (H) 04/02/2022   CHOLHDL 5.4 04/02/2022    Lab Results  Component Value Date   HGBA1C 7.2 (H) 02/09/2024    Assessment & Plan   1. Chronic combined systolic and diastolic heart failure: She was hospitalized in April 2025 in the setting of acute on chronic combined systolic and diastolic heart failure.  Echocardiogram in 01/2024 showed EF 40 to 45%, mildly decreased LV function, G1 DD, mildly reduced RV systolic  function, small pericardial effusion, moderate pleural effusion, mild mitral valve regurgitation.  Her lower extremity edema improved with increased Lasix  dosing, but returned with decreased Lasix  dose.  Today, she has nonpitting right lower extremity edema, otherwise well compensated on exam. Will check BMET today.  If renal function stable, will increase Lasix  to 40 mg daily. Continue spironolactone .   2. Coronary artery calcification/aortic atherosclerosis: CT angio chest in 2023 revealed advanced coronary vascular calcification, mild atherosclerotic calcification of the thoracic aorta. Stable with no anginal symptoms. No indication for ischemic evaluation. Continue Crestor .   3. Mitral valve regurgitation: Mild on most recent echo.  Nonpitting right lower extremity edema as above, overall stable.  Consider increase Lasix  as above.   4. Hypertension: BP well controlled. Continue current antihypertensive regimen.    5. Type 2 diabetes: A1c was 7.2 in 01/2024.  Monitored and managed per PCP.   6. Anemia: Hemoglobin was 11.0 on 02/21/2024. Per hospital notes, outpatient GI evaluation was recommended, recommend follow-up with PCP.    7. Disposition: Follow-up in 3-4 months with Dr. Michele.       Damien JAYSON Braver, NP 04/14/2024, 11:44 AM

## 2024-04-14 NOTE — Patient Instructions (Signed)
 Lab Work: TODAY BMET If you have labs (blood work) drawn today and your tests are completely normal, you will receive your results only by: [employer] (if you have MyChart) OR A paper copy in the mail If you have any lab test that is abnormal or we need to change your treatment, we will call you to review the results.  Follow-Up: At [facility], you and your health needs are our priority.  As part of our continuing mission to provide you with exceptional heart care, our providers are all part of one team.  This team includes your primary Cardiologist (physician) and Advanced Practice Providers or APPs (Physician Assistants and Nurse Practitioners) who all work together to provide you with the care you need, when you need it.  Your next appointment:   3-4 month(s)  Provider:   Madonna Large, DO

## 2024-04-15 LAB — BASIC METABOLIC PANEL WITH GFR
BUN/Creatinine Ratio: 36 — ABNORMAL HIGH (ref 12–28)
BUN: 35 mg/dL — ABNORMAL HIGH (ref 8–27)
CO2: 23 mmol/L (ref 20–29)
Calcium: 9.6 mg/dL (ref 8.7–10.3)
Chloride: 99 mmol/L (ref 96–106)
Creatinine, Ser: 0.96 mg/dL (ref 0.57–1.00)
Glucose: 313 mg/dL — ABNORMAL HIGH (ref 70–99)
Potassium: 4.8 mmol/L (ref 3.5–5.2)
Sodium: 134 mmol/L (ref 134–144)
eGFR: 63 mL/min/{1.73_m2} (ref >59)

## 2024-04-20 ENCOUNTER — Ambulatory Visit: Payer: Self-pay | Admitting: Nurse Practitioner

## 2024-04-20 NOTE — Telephone Encounter (Signed)
 LMOM, to discuss lab results and recommendations. Waiting on a return call.

## 2024-04-30 ENCOUNTER — Other Ambulatory Visit: Payer: Self-pay

## 2024-04-30 DIAGNOSIS — Z79899 Other long term (current) drug therapy: Secondary | ICD-10-CM

## 2024-04-30 DIAGNOSIS — I5042 Chronic combined systolic (congestive) and diastolic (congestive) heart failure: Secondary | ICD-10-CM

## 2024-04-30 DIAGNOSIS — I10 Essential (primary) hypertension: Secondary | ICD-10-CM

## 2024-04-30 DIAGNOSIS — I2510 Atherosclerotic heart disease of native coronary artery without angina pectoris: Secondary | ICD-10-CM

## 2024-05-08 DIAGNOSIS — M546 Pain in thoracic spine: Secondary | ICD-10-CM | POA: Diagnosis not present

## 2024-05-08 DIAGNOSIS — R0781 Pleurodynia: Secondary | ICD-10-CM | POA: Diagnosis not present

## 2024-05-09 ENCOUNTER — Emergency Department (HOSPITAL_COMMUNITY)
Admission: EM | Admit: 2024-05-09 | Discharge: 2024-05-09 | Disposition: A | Discharge status: Home / Self Care | Attending: Emergency Medicine | Admitting: Emergency Medicine

## 2024-05-09 ENCOUNTER — Emergency Department (HOSPITAL_COMMUNITY)

## 2024-05-09 ENCOUNTER — Encounter (HOSPITAL_COMMUNITY): Payer: Self-pay

## 2024-05-09 DIAGNOSIS — M4856XA Collapsed vertebra, not elsewhere classified, lumbar region, initial encounter for fracture: Secondary | ICD-10-CM | POA: Diagnosis not present

## 2024-05-09 DIAGNOSIS — Z7984 Long term (current) use of oral hypoglycemic drugs: Secondary | ICD-10-CM | POA: Diagnosis not present

## 2024-05-09 DIAGNOSIS — I509 Heart failure, unspecified: Secondary | ICD-10-CM | POA: Diagnosis not present

## 2024-05-09 DIAGNOSIS — S22068A Other fracture of T7-T8 thoracic vertebra, initial encounter for closed fracture: Secondary | ICD-10-CM | POA: Diagnosis not present

## 2024-05-09 DIAGNOSIS — S22058A Other fracture of T5-T6 vertebra, initial encounter for closed fracture: Secondary | ICD-10-CM | POA: Insufficient documentation

## 2024-05-09 DIAGNOSIS — M8008XA Age-related osteoporosis with current pathological fracture, vertebra(e), initial encounter for fracture: Secondary | ICD-10-CM | POA: Diagnosis not present

## 2024-05-09 DIAGNOSIS — S22070A Wedge compression fracture of T9-T10 vertebra, initial encounter for closed fracture: Secondary | ICD-10-CM | POA: Diagnosis not present

## 2024-05-09 DIAGNOSIS — R918 Other nonspecific abnormal finding of lung field: Secondary | ICD-10-CM | POA: Diagnosis not present

## 2024-05-09 DIAGNOSIS — I700 Atherosclerosis of aorta: Secondary | ICD-10-CM | POA: Diagnosis not present

## 2024-05-09 DIAGNOSIS — K449 Diaphragmatic hernia without obstruction or gangrene: Secondary | ICD-10-CM | POA: Diagnosis not present

## 2024-05-09 DIAGNOSIS — I110 Hypertensive heart disease with heart failure: Secondary | ICD-10-CM | POA: Insufficient documentation

## 2024-05-09 DIAGNOSIS — S22000A Wedge compression fracture of unspecified thoracic vertebra, initial encounter for closed fracture: Secondary | ICD-10-CM

## 2024-05-09 DIAGNOSIS — E119 Type 2 diabetes mellitus without complications: Secondary | ICD-10-CM | POA: Insufficient documentation

## 2024-05-09 DIAGNOSIS — S22078A Other fracture of T9-T10 vertebra, initial encounter for closed fracture: Secondary | ICD-10-CM | POA: Diagnosis not present

## 2024-05-09 DIAGNOSIS — W19XXXA Unspecified fall, initial encounter: Secondary | ICD-10-CM | POA: Diagnosis not present

## 2024-05-09 DIAGNOSIS — S22050A Wedge compression fracture of T5-T6 vertebra, initial encounter for closed fracture: Secondary | ICD-10-CM | POA: Diagnosis not present

## 2024-05-09 DIAGNOSIS — S299XXA Unspecified injury of thorax, initial encounter: Secondary | ICD-10-CM | POA: Diagnosis not present

## 2024-05-09 DIAGNOSIS — S3992XA Unspecified injury of lower back, initial encounter: Secondary | ICD-10-CM | POA: Diagnosis not present

## 2024-05-09 DIAGNOSIS — M47816 Spondylosis without myelopathy or radiculopathy, lumbar region: Secondary | ICD-10-CM | POA: Diagnosis not present

## 2024-05-09 DIAGNOSIS — S32040A Wedge compression fracture of fourth lumbar vertebra, initial encounter for closed fracture: Secondary | ICD-10-CM | POA: Diagnosis not present

## 2024-05-09 DIAGNOSIS — I2510 Atherosclerotic heart disease of native coronary artery without angina pectoris: Secondary | ICD-10-CM | POA: Diagnosis not present

## 2024-05-09 DIAGNOSIS — S32030A Wedge compression fracture of third lumbar vertebra, initial encounter for closed fracture: Secondary | ICD-10-CM | POA: Diagnosis not present

## 2024-05-09 DIAGNOSIS — J189 Pneumonia, unspecified organism: Secondary | ICD-10-CM | POA: Insufficient documentation

## 2024-05-09 DIAGNOSIS — S22088A Other fracture of T11-T12 vertebra, initial encounter for closed fracture: Secondary | ICD-10-CM | POA: Diagnosis not present

## 2024-05-09 DIAGNOSIS — K209 Esophagitis, unspecified without bleeding: Secondary | ICD-10-CM

## 2024-05-09 DIAGNOSIS — M40204 Unspecified kyphosis, thoracic region: Secondary | ICD-10-CM | POA: Diagnosis not present

## 2024-05-09 DIAGNOSIS — M545 Low back pain, unspecified: Secondary | ICD-10-CM | POA: Diagnosis present

## 2024-05-09 LAB — COMPREHENSIVE METABOLIC PANEL WITH GFR
ALT: 26 U/L (ref 0–44)
AST: 23 U/L (ref 15–41)
Albumin: 3.6 g/dL (ref 3.5–5.0)
Alkaline Phosphatase: 136 U/L — ABNORMAL HIGH (ref 38–126)
Anion gap: 12 (ref 5–15)
BUN: 33 mg/dL — ABNORMAL HIGH (ref 8–23)
CO2: 26 mmol/L (ref 22–32)
Calcium: 10.2 mg/dL (ref 8.9–10.3)
Chloride: 96 mmol/L — ABNORMAL LOW (ref 98–111)
Creatinine, Ser: 0.98 mg/dL (ref 0.44–1.00)
GFR, Estimated: >60 mL/min (ref >60)
Glucose, Bld: 180 mg/dL — ABNORMAL HIGH (ref 70–99)
Potassium: 4.5 mmol/L (ref 3.5–5.1)
Sodium: 134 mmol/L — ABNORMAL LOW (ref 135–145)
Total Bilirubin: 0.5 mg/dL (ref 0.0–1.2)
Total Protein: 7.9 g/dL (ref 6.5–8.1)

## 2024-05-09 LAB — URINALYSIS, ROUTINE W REFLEX MICROSCOPIC
Bilirubin Urine: NEGATIVE
Glucose, UA: NEGATIVE mg/dL
Hgb urine dipstick: NEGATIVE
Ketones, ur: NEGATIVE mg/dL
Nitrite: NEGATIVE
Protein, ur: NEGATIVE mg/dL
Specific Gravity, Urine: 1.009 (ref 1.005–1.030)
pH: 6 (ref 5.0–8.0)

## 2024-05-09 LAB — CBC
HCT: 32.3 % — ABNORMAL LOW (ref 36.0–46.0)
Hemoglobin: 10.7 g/dL — ABNORMAL LOW (ref 12.0–15.0)
MCH: 27.2 pg (ref 26.0–34.0)
MCHC: 33.1 g/dL (ref 30.0–36.0)
MCV: 82.2 fL (ref 80.0–100.0)
Platelets: 269 K/uL (ref 150–400)
RBC: 3.93 MIL/uL (ref 3.87–5.11)
RDW: 21.9 % — ABNORMAL HIGH (ref 11.5–15.5)
WBC: 5.5 K/uL (ref 4.0–10.5)
nRBC: 0 % (ref 0.0–0.2)

## 2024-05-09 LAB — LIPASE, BLOOD: Lipase: 33 U/L (ref 11–51)

## 2024-05-09 LAB — TROPONIN I (HIGH SENSITIVITY): Troponin I (High Sensitivity): 14 ng/L (ref <18)

## 2024-05-09 MED ORDER — OXYCODONE HCL 5 MG PO TABS: 5.0000 mg | ORAL_TABLET | Freq: Four times a day (QID) | ORAL | 0 refills | Status: DC | PRN

## 2024-05-09 MED ORDER — DOXYCYCLINE HYCLATE 100 MG PO CAPS
100.0000 mg | ORAL_CAPSULE | Freq: Two times a day (BID) | ORAL | 0 refills | Status: AC
Start: 2024-05-09 — End: 2024-05-16

## 2024-05-09 MED ORDER — OXYCODONE-ACETAMINOPHEN 5-325 MG PO TABS
1.0000 | ORAL_TABLET | Freq: Once | ORAL | Status: AC
  Administered 2024-05-09: 1 via ORAL
  Filled 2024-05-09: qty 1

## 2024-05-09 MED ORDER — AMOXICILLIN-POT CLAVULANATE 875-125 MG PO TABS
1.0000 | ORAL_TABLET | Freq: Two times a day (BID) | ORAL | 0 refills | Status: DC
Start: 2024-05-09 — End: 2024-05-27

## 2024-05-09 MED ORDER — HYDROCODONE-ACETAMINOPHEN 5-325 MG PO TABS
1.0000 | ORAL_TABLET | Freq: Once | ORAL | Status: AC
  Administered 2024-05-09: 1 via ORAL
  Filled 2024-05-09: qty 1

## 2024-05-09 NOTE — ED Notes (Signed)
 Pt d/c home with family per EDP order. Discharge summary reviewed, off unit via WC.

## 2024-05-09 NOTE — ED Notes (Signed)
 Pt still unable to give a urine sample at this time. Provider notified.

## 2024-05-09 NOTE — ED Notes (Signed)
 Pt transported to CT ?

## 2024-05-09 NOTE — ED Triage Notes (Signed)
 Pt to ED accompanied with family c/o right side pain x 2 weeks, reports 2 weeks ago pt was transferring to bedside commode from [facility] and had an assisted ground level fall. Reports pt has had pain ever since. Pt did not hit head.   Pt husband also reports pt has not urinated since yesterday.

## 2024-05-09 NOTE — ED Triage Notes (Signed)
 Pt c.o pain on both sides under her arms where she was lifted by EMS to assist her from the floor to the chair after a fall attempting to transfer from [facility] to [facility]. Pt also c.o urinary retention but pt did have some urinary incontinence in her diaper today upon arrival to ED. Family also states she has been spitting out acid/foam type of substance from her mouth for several days.

## 2024-05-09 NOTE — ED Notes (Signed)
 Pt refused bedpan after family called out saying she was ready to provide urine sample.

## 2024-05-09 NOTE — ED Provider Notes (Signed)
 [facility] Provider Note   CSN: [id number] Arrival date & time: 05/09/24  1354     Patient presents with: Fall and Urinary Retention   Jill Munoz is a 73 y.o. 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  She did not hit her head or lose consciousness.  During this fall she had her back and then EMS had to come and assist her off the ground.  Since then she has had low thoracic upper lumbar back pain, right-sided chest pain and bilateral shoulder pain mostly under her armpits.  This has been gradually increasing which prompted her to visit urgent care, yesterday.  Urgent care did chest x-ray with rib view, thoracic spine and it was inconclusive so they sent her here.  {Add pertinent medical, surgical, social history, OB history to YEP:67052}  Fall       Prior to Admission medications   Medication Sig Start Date End Date Taking? Authorizing Provider  Ascorbic Acid  (VITAMIN C) 1000 MG tablet Take 1,000 mg by mouth daily.    [provider]  Berberine Chloride 500 MG CAPS Take 1 capsule by mouth in the morning, at noon, and at bedtime.    [provider]  dorzolamide -timolol  (COSOPT ) 22.3-6.8 MG/ML ophthalmic solution Place 1 drop into both eyes 2 (two) times daily. 04/14/22   Elnora Ip, MD  furosemide  (LASIX ) 20 MG tablet Take Lasix  40 mg daily x 1 week. After that, take Lasix  20 mg daily. You may take an addition Lasix  20 mg daily (for a total of 40 mg) as needed for swelling. 03/02/24   Daneen Damien BROCKS, NP  glucose blood (ACCU-CHEK GUIDE) test strip Use as instructed once a day  09/18/22   Ozell Heron HERO, MD  melatonin 3 MG TABS tablet Take 3 mg by mouth at bedtime.    [provider]  metFORMIN (GLUCOPHAGE) 500 MG tablet Take 500 mg by mouth in the morning and at bedtime. 09/13/23   [provider]  oxyCODONE -acetaminophen  (PERCOCET/ROXICET) 5-325 MG tablet Take 1 tablet by mouth every 8 (eight) hours as needed. 03/06/24   Harden Jerona GAILS, MD  pantoprazole  (PROTONIX ) 40 MG tablet Take 1 tablet (40 mg total) by mouth daily. 08/21/22   Ozell Heron HERO, MD  polyethylene glycol (MIRALAX  / GLYCOLAX ) 17 g packet Take 17 g by mouth daily. 02/12/24   Fairy Frames, MD  rosuvastatin  (CRESTOR ) 20 MG tablet Take 1 tablet (20 mg total) by mouth at bedtime. 03/02/24 05/31/24  Daneen Damien BROCKS, NP  spironolactone  (ALDACTONE ) 25 MG tablet Take 1 tablet (25 mg total) by mouth daily. 03/02/24 05/31/24  Daneen Damien BROCKS, NP    Allergies: Patient has no known allergies.    Review of Systems  Musculoskeletal:  Positive for arthralgias.    Updated Vital Signs BP 126/64 (BP Location: Right Arm)   Pulse 87   Temp 97.9 F (36.6 C)   Resp 18   Ht 5' 2 (1.575 m)   Wt 47.6 kg   SpO2 95%   BMI 19.20 kg/m   Physical Exam Vitals and nursing note reviewed.  Constitutional:      General: She is not in  acute distress.    Appearance: She is not toxic-appearing.  HENT:     Head: Normocephalic and atraumatic.  Eyes:     General: No scleral icterus.    Conjunctiva/sclera: Conjunctivae normal.  Cardiovascular:     Rate and Rhythm: Normal rate and regular rhythm.     Pulses: Normal pulses.     Heart sounds: Normal heart sounds.  Pulmonary:     Effort: Pulmonary effort is normal. No respiratory distress.     Breath sounds: Normal breath sounds.  Abdominal:     General: Abdomen is flat. Bowel sounds are normal.     Palpations: Abdomen is soft.     Tenderness: There is no abdominal tenderness.  Skin:    General: Skin is warm and dry.     Findings: No lesion.   Neurological:     General: No focal deficit present.     Mental Status: She is alert and oriented to person, place, and time. Mental status is at baseline.     (all labs ordered are listed, but only abnormal results are displayed) Labs Reviewed  COMPREHENSIVE METABOLIC PANEL WITH GFR - Abnormal; Notable for the following components:      Result Value   Sodium 134 (*)    Chloride 96 (*)    Glucose, Bld 180 (*)    BUN 33 (*)    Alkaline Phosphatase 136 (*)    All other components within normal limits  CBC - Abnormal; Notable for the following components:   Hemoglobin 10.7 (*)    HCT 32.3 (*)    RDW 21.9 (*)    All other components within normal limits  URINALYSIS, ROUTINE W REFLEX MICROSCOPIC - Abnormal; Notable for the following components:   APPearance HAZY (*)    Leukocytes,Ua MODERATE (*)    Bacteria, UA FEW (*)    All other components within normal limits  LIPASE, BLOOD  TROPONIN I (HIGH SENSITIVITY)    EKG: None  Radiology: CT T-SPINE NO CHARGE Result Date: 05/09/2024 CLINICAL DATA:  Blunt chest Trauma EXAM: CT Thoracic Spine without contrast TECHNIQUE: Multiplanar CT images of the thoracic spine were reconstructed from contemporary CT of the Chest. RADIATION DOSE REDUCTION: This exam was performed according to the departmental dose-optimization program which includes automated exposure control, adjustment of the mA and/or kV according to patient size and/or use of iterative reconstruction technique. CONTRAST:  None or No additional COMPARISON:  CT chest 04/26/2022 FINDINGS: Alignment: Substantial lower thoracic kyphotic curvature. No subluxation. Vertebrae: Remote compression fractures at T8, T10, T11, T12, and L1. Likely subacute 50% compression fracture at T6 with associated vertebral sclerosis and 4 mm posterior bony retropulsion indicative of middle column involvement. Acute or subacute 40% compression fracture at T9 with 2 mm posterior bony retropulsion and some residual  visibility of a lucent cleft along the anterior portion of the vertebral body. Some of the sclerosis at this level extends into the left pedicle and. Bony demineralization is observed. The multiplicity of compression fractures indicate osteoporosis. Paraspinal and other soft tissues: Please see dedicated chest CT report. Disc levels: Moderate bilateral foraminal stenosis at C6-7 due to intervertebral and facet spurring. IMPRESSION: 1. Acute or subacute 40% compression fracture at T9 with 2 mm posterior bony retropulsion and some residual visibility of a lucent cleft along the anterior portion of the vertebral body. Some of the sclerosis at this level extends into the left pedicle. 2. Likely subacute 50% compression fracture at T6 with associated vertebral sclerosis and 4 mm  posterior bony retropulsion indicative of middle column involvement. 3. Remote compression fractures at T8, T10, T11, T12, and L1. 4. Bony demineralization. The multiplicity of compression fractures indicate osteoporosis. 5. Moderate bilateral foraminal stenosis at C6-7 due to intervertebral and facet spurring. Electronically Signed   By: Ryan Salvage M.D.   On: 05/09/2024 18:09   CT Chest Wo Contrast Result Date: 05/09/2024 CLINICAL DATA:  Trauma EXAM: CT CHEST WITHOUT CONTRAST TECHNIQUE: Multidetector CT imaging of the chest was performed following the standard protocol without IV contrast. RADIATION DOSE REDUCTION: This exam was performed according to the departmental dose-optimization program which includes automated exposure control, adjustment of the mA and/or kV according to patient size and/or use of iterative reconstruction technique. COMPARISON:  CT PE protocol 04/26/2022. FINDINGS: Cardiovascular: No significant vascular findings. Normal heart size. No pericardial effusion. Aorta is ectatic. There are atherosclerotic calcifications of the aorta and coronary arteries. Mediastinum/Nodes: There is some wall thickening of the  distal esophagus. There is a small amount of fluid and debris in the mid and distal esophagus. There is a moderate-sized hiatal hernia. The stomach is nondilated. There are no enlarged mediastinal or hilar lymph nodes identified. Visualized thyroid gland is within normal limits. Lungs/Pleura: There is a small amount of airspace disease with air bronchograms in the right middle lobe and lingula. There is no pleural effusion or pneumothorax. There are few scattered 2 mm nodular densities in the left upper lobe and left lower lobe which appear unchanged. Upper Abdomen: Gallbladder wall calcifications are present. There are punctate right renal calculi. Musculoskeletal: The bones are osteopenic. Compression deformity of T6 and T9 appear new from 2023 enter age indeterminate. Other thoracic compression deformities are unchanged from prior. IMPRESSION: 1. Compression deformity of T6 and T9 appear new from 2023 and are age indeterminate. 2. Small amount of airspace disease with air bronchograms in the right middle lobe and lingula, worrisome for pneumonia. 3. Moderate-sized hiatal hernia with wall thickening of the distal esophagus and small amount of fluid and debris in the mid and distal esophagus. Findings may be related to esophagitis, but neoplasm is not excluded. Consider endoscopy for further evaluation. 4. Gallbladder wall calcifications. Correlate clinically for acalculous cholecystitis. 5. Right renal calculi. 6. Aortic atherosclerosis. Aortic Atherosclerosis (ICD10-I70.0). Electronically Signed   By: Greig Pique M.D.   On: 05/09/2024 18:07   CT Lumbar Spine Wo Contrast Result Date: 05/09/2024 CLINICAL DATA:  Back trauma, no prior imaging (Age >= 16y). EXAM: CT LUMBAR SPINE WITHOUT CONTRAST TECHNIQUE: Multidetector CT imaging of the lumbar spine was performed without intravenous contrast administration. Multiplanar CT image reconstructions were also generated. RADIATION DOSE REDUCTION: This exam was  performed according to the departmental dose-optimization program which includes automated exposure control, adjustment of the mA and/or kV according to patient size and/or use of iterative reconstruction technique. COMPARISON:  CT abdomen and pelvis 04/26/2022 FINDINGS: Segmentation: 5 lumbar type vertebrae. Alignment: Normal. Vertebrae: Diffuse osteopenia. Unchanged chronic L1 compression fracture with severe vertebral body height loss centrally. Unchanged mild chronic L2 compression fracture. New age-indeterminate L3, L4, and L5 compression fractures with 40%, 45%, and 30% vertebral body height loss, respectively. At most minimal retropulsion of the L3 and L4 superior endplates. No destructive process. Paraspinal and other soft tissues: Aortic atherosclerosis. Punctate nonobstructing bilateral renal calculi. Mildly dilated bilateral extrarenal pelves. Disc levels: Mild lumbar spondylosis with preserved disc height. Mild right facet arthrosis at L5-S1. No evidence of significant spinal stenosis. Mild neural foraminal stenosis on the right at L2-3 and  L3-4 and mild-to-moderate bilateral neural foraminal stenosis at L4-5. IMPRESSION: 1. L3, L4, and L5 compression fractures, new from 2023 but otherwise age indeterminate. 2. Chronic L1 and L2 compression fractures. 3.  Aortic Atherosclerosis (ICD10-I70.0). Electronically Signed   By: Dasie Hamburg M.D.   On: 05/09/2024 18:07    {Document cardiac monitor, telemetry assessment procedure when appropriate:32947} Procedures   Medications Ordered in the ED - No data to display    {Click here for ABCD2, HEART and other calculators REFRESH Note before signing:1}                              Medical Decision Making Amount and/or Complexity of Data Reviewed Labs: ordered. Radiology: ordered.  Risk Prescription drug management.   Artia More 73 y.o. presented today for fall. Working DDx that I considered at this time includes, but not limited to,  intracranial hemorrhage, subdural/epidural hematoma, vertebral fracture, spinal cord injury, muscle strain, skull fracture, fracture, splenic injury, liver injury, perforated viscus, contusions.  R/o DDx: These diagnoses are less consistent than current impression due to findings on history of present illness, physical exam, labs/imaging findings.  Review of prior external notes: Urgent care visit yesterday 05/08/2024  Pmhx: coronary artery disease, heart failure, hypertension, diabetes, GERD, status post above-knee amputation  Unique Tests and My Interpretation:  CBC without leukocytosis.  Hemoglobin is 10.7.  CMP without significant electrolyte abnormality.  It does show normal kidney function.  Lipase is 33. Troponin wnl UA positive for leukocytes but no urinary symptoms like dysuria, frequency urgency  Imaging:  CT imaging of the chest, T-spine and L-spine: ***  Consulted with Dr. Lindalee neurosurgery in regards to spine imaging with retropulsion.  Felt this was stable for symptom management and outpatient follow-up.  Will try abdominal binder as patient is likely not to tolerate TLSO.  Can consider MRI if develops neurosymptoms.  Problem List / ED Course / Critical interventions / Medication management  ***  I ordered medication including oxycodone  for pain Reevaluation of the patient after these medicines showed that the patient improved Patients vitals assessed. Upon arrival patient is hemodynamically stable.  I have reviewed the patients home medicines and have made adjustments as needed    Plan: F/u w/ PCP in 2-3d to ensure resolution of sx.  Patient was given return precautions. Patient stable for discharge at this time.  Patient educated on current sx/dx and verbalized understanding of plan. Return to ER w/ new or worsening sx.     {Document critical care time when appropriate  Document review of labs and clinical decision tools ie CHADS2VASC2, etc  Document your  independent review of radiology images and any outside records  Document your discussion with family members, caretakers and with consultants  Document social determinants of health affecting pt's care  Document your decision making why or why not admission, treatments were needed:32947:::1}   Final diagnoses:  None    ED Discharge Orders     None

## 2024-05-09 NOTE — ED Notes (Signed)
Bladder scan volume: 66 mL.

## 2024-05-09 NOTE — Discharge Instructions (Addendum)
 Take antibiotic as prescribed, follow-up with primary care for symptom recheck.  Return to ER with new or worsening symptoms. You can take 650 mg of Tylenol  every 8 hours.  You can also take ibuprofen but make sure taking with food or water.  Take oxycodone  for breakthrough pain. Wear abdominal binder for support, follow up with spine doctor.

## 2024-05-18 DIAGNOSIS — I10 Essential (primary) hypertension: Secondary | ICD-10-CM | POA: Diagnosis not present

## 2024-05-18 DIAGNOSIS — K449 Diaphragmatic hernia without obstruction or gangrene: Secondary | ICD-10-CM | POA: Diagnosis not present

## 2024-05-18 DIAGNOSIS — M48061 Spinal stenosis, lumbar region without neurogenic claudication: Secondary | ICD-10-CM | POA: Diagnosis not present

## 2024-05-18 DIAGNOSIS — S22000A Wedge compression fracture of unspecified thoracic vertebra, initial encounter for closed fracture: Secondary | ICD-10-CM | POA: Diagnosis not present

## 2024-05-18 DIAGNOSIS — S32000A Wedge compression fracture of unspecified lumbar vertebra, initial encounter for closed fracture: Secondary | ICD-10-CM | POA: Diagnosis not present

## 2024-05-18 DIAGNOSIS — R600 Localized edema: Secondary | ICD-10-CM | POA: Diagnosis not present

## 2024-05-18 DIAGNOSIS — K219 Gastro-esophageal reflux disease without esophagitis: Secondary | ICD-10-CM | POA: Diagnosis not present

## 2024-05-18 DIAGNOSIS — I5020 Unspecified systolic (congestive) heart failure: Secondary | ICD-10-CM | POA: Diagnosis not present

## 2024-05-27 ENCOUNTER — Emergency Department (HOSPITAL_COMMUNITY)
Admission: EM | Admit: 2024-05-27 | Discharge: 2024-05-27 | Disposition: A | Discharge status: Home / Self Care | Source: Ambulatory Visit | Attending: Emergency Medicine | Admitting: Emergency Medicine

## 2024-05-27 ENCOUNTER — Emergency Department (HOSPITAL_COMMUNITY)

## 2024-05-27 ENCOUNTER — Other Ambulatory Visit: Payer: Self-pay

## 2024-05-27 ENCOUNTER — Encounter (HOSPITAL_COMMUNITY): Payer: Self-pay

## 2024-05-27 DIAGNOSIS — I509 Heart failure, unspecified: Secondary | ICD-10-CM | POA: Diagnosis not present

## 2024-05-27 DIAGNOSIS — R7989 Other specified abnormal findings of blood chemistry: Secondary | ICD-10-CM | POA: Diagnosis not present

## 2024-05-27 DIAGNOSIS — I10 Essential (primary) hypertension: Secondary | ICD-10-CM | POA: Insufficient documentation

## 2024-05-27 DIAGNOSIS — Z89612 Acquired absence of left leg above knee: Secondary | ICD-10-CM | POA: Insufficient documentation

## 2024-05-27 DIAGNOSIS — Z89512 Acquired absence of left leg below knee: Secondary | ICD-10-CM | POA: Insufficient documentation

## 2024-05-27 DIAGNOSIS — L97418 Non-pressure chronic ulcer of right heel and midfoot with other specified severity: Secondary | ICD-10-CM | POA: Diagnosis not present

## 2024-05-27 DIAGNOSIS — E11621 Type 2 diabetes mellitus with foot ulcer: Secondary | ICD-10-CM | POA: Insufficient documentation

## 2024-05-27 DIAGNOSIS — D649 Anemia, unspecified: Secondary | ICD-10-CM | POA: Insufficient documentation

## 2024-05-27 DIAGNOSIS — I110 Hypertensive heart disease with heart failure: Secondary | ICD-10-CM | POA: Diagnosis not present

## 2024-05-27 DIAGNOSIS — Z7984 Long term (current) use of oral hypoglycemic drugs: Secondary | ICD-10-CM | POA: Insufficient documentation

## 2024-05-27 DIAGNOSIS — E119 Type 2 diabetes mellitus without complications: Secondary | ICD-10-CM | POA: Insufficient documentation

## 2024-05-27 DIAGNOSIS — Z79899 Other long term (current) drug therapy: Secondary | ICD-10-CM | POA: Insufficient documentation

## 2024-05-27 DIAGNOSIS — M726 Necrotizing fasciitis: Secondary | ICD-10-CM | POA: Insufficient documentation

## 2024-05-27 DIAGNOSIS — M7989 Other specified soft tissue disorders: Secondary | ICD-10-CM | POA: Diagnosis not present

## 2024-05-27 DIAGNOSIS — L03115 Cellulitis of right lower limb: Secondary | ICD-10-CM | POA: Diagnosis not present

## 2024-05-27 LAB — BASIC METABOLIC PANEL WITH GFR
Anion gap: 11 (ref 5–15)
BUN: 17 mg/dL (ref 8–23)
CO2: 24 mmol/L (ref 22–32)
Calcium: 9.3 mg/dL (ref 8.9–10.3)
Chloride: 99 mmol/L (ref 98–111)
Creatinine, Ser: 0.87 mg/dL (ref 0.44–1.00)
GFR, Estimated: >60 mL/min (ref >60)
Glucose, Bld: 134 mg/dL — ABNORMAL HIGH (ref 70–99)
Potassium: 4 mmol/L (ref 3.5–5.1)
Sodium: 134 mmol/L — ABNORMAL LOW (ref 135–145)

## 2024-05-27 LAB — CBC
HCT: 32 % — ABNORMAL LOW (ref 36.0–46.0)
Hemoglobin: 10.4 g/dL — ABNORMAL LOW (ref 12.0–15.0)
MCH: 27.4 pg (ref 26.0–34.0)
MCHC: 32.5 g/dL (ref 30.0–36.0)
MCV: 84.4 fL (ref 80.0–100.0)
Platelets: 352 K/uL (ref 150–400)
RBC: 3.79 MIL/uL — ABNORMAL LOW (ref 3.87–5.11)
RDW: 18.7 % — ABNORMAL HIGH (ref 11.5–15.5)
WBC: 7.1 K/uL (ref 4.0–10.5)
nRBC: 0 % (ref 0.0–0.2)

## 2024-05-27 LAB — BRAIN NATRIURETIC PEPTIDE: B Natriuretic Peptide: 139.8 pg/mL — ABNORMAL HIGH (ref 0.0–100.0)

## 2024-05-27 LAB — I-STAT CG4 LACTIC ACID, ED: Lactic Acid, Venous: 0.6 mmol/L (ref 0.5–1.9)

## 2024-05-27 MED ORDER — OXYCODONE-ACETAMINOPHEN 5-325 MG PO TABS
1.0000 | ORAL_TABLET | Freq: Once | ORAL | Status: AC
  Administered 2024-05-27 (×2): 1 via ORAL

## 2024-05-27 MED ORDER — AMOXICILLIN-POT CLAVULANATE 875-125 MG PO TABS
1.0000 | ORAL_TABLET | Freq: Once | ORAL | Status: AC
  Administered 2024-05-27 (×2): 1 via ORAL
  Filled 2024-05-27: qty 1

## 2024-05-27 MED ORDER — OXYCODONE-ACETAMINOPHEN 5-325 MG PO TABS
ORAL_TABLET | ORAL | Status: AC
  Filled 2024-05-27: qty 1

## 2024-05-27 MED ORDER — VALACYCLOVIR HCL 1 G PO TABS: 1000.0000 mg | ORAL_TABLET | Freq: Three times a day (TID) | ORAL | 0 refills | Status: DC

## 2024-05-27 MED ORDER — AMOXICILLIN-POT CLAVULANATE 875-125 MG PO TABS: 1.0000 | ORAL_TABLET | Freq: Two times a day (BID) | ORAL | 0 refills | Status: DC

## 2024-05-27 NOTE — ED Triage Notes (Signed)
 Pt bib POV c/o right foot infection. Pt son says pt foot is swollen and red. Pt denies fever, chills, and N/V

## 2024-05-27 NOTE — ED Provider Triage Note (Signed)
 Emergency Medicine Provider Triage Evaluation Note  Jill Munoz , a 73 y.o. female  was evaluated in triage.  Pt complains of right foot infection. Hx of severe infections in the past. History of necrotizing fasciitis. AKA on left. Hx of diabetes, heart failure. Just noticed redness and swelling today. Was in hospital 2 weeks ago for pneumonia.  Review of Systems  Positive: Foot infection Negative:   Physical Exam  BP (!) 187/107 (BP Location: Right Arm)   Pulse 83   Temp 97.6 F (36.4 C)   Resp 17   Ht 5' 1 (1.549 m)   Wt 47.6 kg   SpO2 96%   BMI 19.84 kg/m  Gen:   Awake, no distress   Resp:  Normal effort  MSK:   Moves extremities without difficulty  Other:  Ulcer on bottom of right foot, surround redness, soft tissue swelling. Some abrasion, weeping of right ankle  Medical Decision Making  Medically screening exam initiated at 1:38 PM.  Appropriate orders placed.  Aleni Bais was informed that the remainder of the evaluation will be completed by another provider, this initial triage assessment does not replace that evaluation, and the importance of remaining in the ED until their evaluation is complete.  Workup initiated in triage    Rosan Sherlean DEL, PA-C 05/27/24 1340

## 2024-05-27 NOTE — Discharge Instructions (Signed)
 You have an infection involving your leg.  This is likely cellulitis.  Please take antibiotic as prescribed.  There are several blisters that appears on your skin which may indicate shingles which is a viral infection.  Please take valacyclovir  as prescribed to cover for that.  Follow-up closely with your doctor for further care.  Return promptly to the ER if your condition worsen or if you have other concerns.

## 2024-05-27 NOTE — Group Note (Deleted)
 Date:  05/27/2024 Time:  2:22 PM  Group Topic/Focus:  Wellness Toolbox:   The focus of this group is to discuss various aspects of wellness, balancing those aspects and exploring ways to increase the ability to experience wellness.  Patients will create a wellness toolbox for use upon discharge.     Participation Level:  {BHH PARTICIPATION OZCZO:77735}  Participation Quality:  {BHH PARTICIPATION QUALITY:22265}  Affect:  {BHH AFFECT:22266}  Cognitive:  {BHH COGNITIVE:22267}  Insight: {BHH Insight2:20797}  Engagement in Group:  {BHH ENGAGEMENT IN HMNLE:77731}  Modes of Intervention:  {BHH MODES OF INTERVENTION:22269}  Additional Comments:  ***  Myra Curtistine BROCKS 05/27/2024, 2:22 PM

## 2024-05-27 NOTE — ED Provider Notes (Signed)
 [place] EMERGENCY DEPARTMENT AT [facility] Provider Note   CSN: [id number] Arrival date & time: 05/27/24  1243     Patient presents with: Recurrent Skin Infections   Jill Munoz is a 73 y.o. female.   The history is provided by the patient, the spouse and medical records. No language interpreter was used.     73 year old female with a history of diabetes, anemia, hypertension presenting with complaints of foot pain.  History obtained through patient and through husband who is at bedside.  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  For the past 2 days husband noticed swelling and redness that appears to her right lower extremity which concerns him.  Patient states pain is mild to moderate in but does not endorse any fever or chills denies any nausea or vomiting and denies any chest pain or shortness of breath.  Denies any recent injury.  Patient was previously diagnosed with pneumonia and was take antibiotic approximately 2 weeks ago.  Prior to Admission medications   Medication Sig Start Date End Date Taking? Authorizing Provider  amoxicillin -clavulanate (AUGMENTIN ) 875-125 MG tablet Take 1 tablet by mouth every 12 (twelve) hours. 05/09/24   Barrett, Warren SAILOR, PA-C  Ascorbic Acid  (VITAMIN C) 1000 MG tablet Take 1,000 mg by mouth daily.    [provider]  Berberine Chloride 500 MG CAPS Take 1 capsule by mouth in the morning, at noon, and at bedtime.    [provider]  dorzolamide -timolol  (COSOPT ) 22.3-6.8 MG/ML ophthalmic solution Place 1 drop into both eyes 2 (two) times daily. 04/14/22   Elnora Ip, MD  furosemide  (LASIX ) 20 MG tablet Take Lasix  40 mg daily x 1 week. After that, take Lasix  20 mg daily. You may take an addition Lasix  20 mg daily (for a total of 40 mg) as needed for swelling. 03/02/24   Daneen Damien BROCKS, NP  glucose blood (ACCU-CHEK GUIDE) test strip  Use as instructed once a day 09/18/22   Ozell Heron HERO, MD  melatonin 3 MG TABS tablet Take 3 mg by mouth at bedtime.    [provider]  metFORMIN (GLUCOPHAGE) 500 MG tablet Take 500 mg by mouth in the morning and at bedtime. 09/13/23   [provider]  oxyCODONE  (ROXICODONE ) 5 MG immediate release tablet Take 1 tablet (5 mg total) by mouth every 6 (six) hours as needed for severe pain (pain score 7-10) or breakthrough pain. 05/09/24   Barrett, Jamie N, PA-C  oxyCODONE -acetaminophen  (PERCOCET/ROXICET) 5-325 MG tablet Take 1 tablet by mouth every 8 (eight) hours as needed. 03/06/24   Harden Jerona GAILS, MD  pantoprazole  (PROTONIX ) 40 MG tablet Take 1 tablet (40 mg total) by mouth daily. 08/21/22   Ozell Heron HERO, MD  polyethylene glycol (MIRALAX  / GLYCOLAX ) 17 g packet Take 17 g by mouth daily. 02/12/24   Fairy Frames, MD  rosuvastatin  (CRESTOR ) 20 MG tablet Take 1 tablet (20 mg total) by mouth at bedtime. 03/02/24 05/31/24  Daneen Damien BROCKS, NP  spironolactone  (ALDACTONE ) 25 MG tablet Take 1 tablet (25 mg total) by mouth daily. 03/02/24 05/31/24  Daneen Damien BROCKS, NP    Allergies: Patient has no known allergies.    Review of Systems  All other systems reviewed and are negative.   Updated Vital Signs BP 114/76   Pulse 86   Temp (!) 97.4 F (36.3 C) (Oral)   Resp 18   Ht 5'  1 (1.549 m)   Wt 47.6 kg   SpO2 98%   BMI 19.84 kg/m   Physical Exam Vitals and nursing note reviewed.  Constitutional:      General: She is not in acute distress.    Appearance: She is well-developed.  HENT:     Head: Atraumatic.  Eyes:     Conjunctiva/sclera: Conjunctivae normal.  Pulmonary:     Effort: Pulmonary effort is normal.  Abdominal:     Palpations: Abdomen is soft.     Tenderness: There is no abdominal tenderness.  Musculoskeletal:     Cervical back: Neck supple.     Comments: Left BKA  Skin:    Findings: Rash present.     Comments: Right lower extremity: Patchy erythema  noted to the anterolateral aspect of the right tib-fib region with several blisters following exam L5 dermatomes.  There are also some erythema and a chronic ulceration noted to the pad of the right foot not actively using all having any odor.  Pedal pulse difficult to palpate with cap refill 2+  Neurological:     Mental Status: She is alert.  Psychiatric:        Mood and Affect: Mood normal.          (all labs ordered are listed, but only abnormal results are displayed) Labs Reviewed  CBC - Abnormal; Notable for the following components:      Result Value   RBC 3.79 (*)    Hemoglobin 10.4 (*)    HCT 32.0 (*)    RDW 18.7 (*)    All other components within normal limits  BASIC METABOLIC PANEL WITH GFR - Abnormal; Notable for the following components:   Sodium 134 (*)    Glucose, Bld 134 (*)    All other components within normal limits  BRAIN NATRIURETIC PEPTIDE - Abnormal; Notable for the following components:   B Natriuretic Peptide 139.8 (*)    All other components within normal limits  I-STAT CG4 LACTIC ACID, ED  I-STAT CG4 LACTIC ACID, ED    EKG: None  Radiology: DG Ankle Complete Right Result Date: 05/27/2024 CLINICAL DATA:  Right ankle swelling. EXAM: RIGHT ANKLE - COMPLETE 3+ VIEW COMPARISON:  None Available. FINDINGS: There is no evidence of fracture, dislocation, or joint effusion. There is no evidence of arthropathy or other focal bone abnormality. Vascular calcifications are noted. IMPRESSION: No acute bony abnormality seen. Electronically Signed   By: Lynwood Landy Raddle M.D.   On: 05/27/2024 15:24   DG Foot Complete Right Result Date: 05/27/2024 CLINICAL DATA:  Right foot swelling. EXAM: RIGHT FOOT COMPLETE - 3+ VIEW COMPARISON:  None Available. FINDINGS: There is no evidence of fracture or dislocation. There is no evidence of arthropathy or other focal bone abnormality. Vascular calcifications are noted. Dorsal soft tissue swelling is noted. IMPRESSION: No definite  bony abnormality is noted. Dorsal soft tissue swelling is noted. Electronically Signed   By: Lynwood Landy Raddle M.D.   On: 05/27/2024 15:22     Procedures   Medications Ordered in the ED  oxyCODONE -acetaminophen  (PERCOCET/ROXICET) 5-325 MG per tablet 1 tablet (1 tablet Oral Given 05/27/24 1627)                                    Medical Decision Making Risk Prescription drug management.   BP 114/76   Pulse 86   Temp (!) 97.4 F (36.3 C) (Oral)  Resp 18   Ht 5' 1 (1.549 m)   Wt 47.6 kg   SpO2 98%   BMI 19.84 kg/m   52:9 PM 73 year old female with a history of diabetes, anemia, hypertension presenting with complaints of foot pain.  History obtained through patient and through husband who is at bedside.  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  For the past 2 days husband noticed swelling and redness that appears to her right lower extremity which concerns him.  Patient states pain is mild to moderate in but does not endorse any fever or chills denies any nausea or vomiting and denies any chest pain or shortness of breath.  Denies any recent injury.  Patient was previously diagnosed with pneumonia and was take antibiotic approximately 2 weeks ago.  On exam, patient does have erythema and several blister noted to her right anterolateral lower extremity as well as a chronic ulceration to the ball of her foot as depicted in pictures that I posted above.  Findings suggestive of cellulitis but with blister and an L5 dermatomal pattern this could also be shingles.  -Labs ordered, independently viewed and interpreted by me.  Labs remarkable for normal WBC, normal lactic acid therefore patient does not meet SIRS or sepsis criteria.   BNP minimally elevated at 139. -The patient was maintained on a cardiac monitor.  I personally viewed and interpreted the cardiac monitored which showed an underlying rhythm of: Sinus  rhythm -Imaging independently viewed and interpreted by me and I agree with radiologist's interpretation.  Result remarkable for x-ray of right foot and right ankle without acute finding.  Some dorsal soft tissue swelling wound noted on the foot. -This patient presents to the ED for concern of leg pain, this involves an extensive number of treatment options, and is a complaint that carries with it a high risk of complications and morbidity.  The differential diagnosis includes cellulitis, abscess, shingles, diabetic foot ulcer, necrotizing fasciitis -Co morbidities that complicate the patient evaluation includes diabetes, hypertension -Treatment includes Augmentin , Percocet -Reevaluation of the patient after these medicines showed that the patient improved -PCP office notes or outside notes reviewed -Discussion with attending Dr. Elnor -Escalation to admission/observation considered: patients feels much better, is comfortable with discharge, and will follow up with PCP -Prescription medication considered, patient comfortable with augmentin  and valcyclovir -Social Determinant of Health considered which includes lack of mobility  Doppler ultrasound of the foot showing adequate pulses.    Final diagnoses:  Cellulitis of right anterior lower leg    ED Discharge Orders          Ordered    amoxicillin -clavulanate (AUGMENTIN ) 875-125 MG tablet  Every 12 hours        05/27/24 2239    valACYclovir  (VALTREX ) 1000 MG tablet  3 times daily        05/27/24 2239               Nivia Colon, PA-C 05/27/24 2240    Elnor Jayson LABOR, DO 05/31/24 [phone]

## 2024-05-27 NOTE — ED Notes (Signed)
 1st lac 0.6 in normal range, 2nd not needed

## 2024-05-30 ENCOUNTER — Other Ambulatory Visit: Payer: Self-pay | Admitting: Nurse Practitioner

## 2024-05-30 DIAGNOSIS — I5042 Chronic combined systolic (congestive) and diastolic (congestive) heart failure: Secondary | ICD-10-CM

## 2024-06-01 DIAGNOSIS — Z794 Long term (current) use of insulin: Secondary | ICD-10-CM | POA: Diagnosis not present

## 2024-06-01 DIAGNOSIS — H4053X4 Glaucoma secondary to other eye disorders, bilateral, indeterminate stage: Secondary | ICD-10-CM | POA: Diagnosis not present

## 2024-06-01 DIAGNOSIS — E113513 Type 2 diabetes mellitus with proliferative diabetic retinopathy with macular edema, bilateral: Secondary | ICD-10-CM | POA: Diagnosis not present

## 2024-06-19 ENCOUNTER — Other Ambulatory Visit: Payer: Self-pay

## 2024-06-19 ENCOUNTER — Emergency Department (HOSPITAL_COMMUNITY)

## 2024-06-19 ENCOUNTER — Inpatient Hospital Stay (HOSPITAL_COMMUNITY)
Admission: EM | Admit: 2024-06-19 | Discharge: 2024-06-26 | DRG: 628 | Disposition: A | Discharge status: Home Health | Attending: Family Medicine | Admitting: Family Medicine

## 2024-06-19 ENCOUNTER — Encounter (HOSPITAL_COMMUNITY): Payer: Self-pay

## 2024-06-19 DIAGNOSIS — I5023 Acute on chronic systolic (congestive) heart failure: Secondary | ICD-10-CM | POA: Diagnosis not present

## 2024-06-19 DIAGNOSIS — Z7984 Long term (current) use of oral hypoglycemic drugs: Secondary | ICD-10-CM

## 2024-06-19 DIAGNOSIS — L089 Local infection of the skin and subcutaneous tissue, unspecified: Secondary | ICD-10-CM | POA: Diagnosis present

## 2024-06-19 DIAGNOSIS — M19071 Primary osteoarthritis, right ankle and foot: Secondary | ICD-10-CM | POA: Diagnosis not present

## 2024-06-19 DIAGNOSIS — E785 Hyperlipidemia, unspecified: Secondary | ICD-10-CM | POA: Diagnosis present

## 2024-06-19 DIAGNOSIS — E1169 Type 2 diabetes mellitus with other specified complication: Secondary | ICD-10-CM | POA: Diagnosis not present

## 2024-06-19 DIAGNOSIS — Z5329 Procedure and treatment not carried out because of patient's decision for other reasons: Secondary | ICD-10-CM | POA: Diagnosis present

## 2024-06-19 DIAGNOSIS — Z79899 Other long term (current) drug therapy: Secondary | ICD-10-CM

## 2024-06-19 DIAGNOSIS — I130 Hypertensive heart and chronic kidney disease with heart failure and stage 1 through stage 4 chronic kidney disease, or unspecified chronic kidney disease: Secondary | ICD-10-CM | POA: Diagnosis present

## 2024-06-19 DIAGNOSIS — M869 Osteomyelitis, unspecified: Secondary | ICD-10-CM | POA: Diagnosis not present

## 2024-06-19 DIAGNOSIS — E1165 Type 2 diabetes mellitus with hyperglycemia: Secondary | ICD-10-CM | POA: Diagnosis not present

## 2024-06-19 DIAGNOSIS — I3139 Other pericardial effusion (noninflammatory): Secondary | ICD-10-CM | POA: Diagnosis present

## 2024-06-19 DIAGNOSIS — E1151 Type 2 diabetes mellitus with diabetic peripheral angiopathy without gangrene: Secondary | ICD-10-CM | POA: Diagnosis present

## 2024-06-19 DIAGNOSIS — I5022 Chronic systolic (congestive) heart failure: Secondary | ICD-10-CM | POA: Diagnosis present

## 2024-06-19 DIAGNOSIS — E11621 Type 2 diabetes mellitus with foot ulcer: Secondary | ICD-10-CM | POA: Diagnosis not present

## 2024-06-19 DIAGNOSIS — K219 Gastro-esophageal reflux disease without esophagitis: Secondary | ICD-10-CM | POA: Diagnosis not present

## 2024-06-19 DIAGNOSIS — N179 Acute kidney failure, unspecified: Secondary | ICD-10-CM | POA: Diagnosis not present

## 2024-06-19 DIAGNOSIS — N182 Chronic kidney disease, stage 2 (mild): Secondary | ICD-10-CM | POA: Diagnosis present

## 2024-06-19 DIAGNOSIS — L97512 Non-pressure chronic ulcer of other part of right foot with fat layer exposed: Secondary | ICD-10-CM | POA: Diagnosis not present

## 2024-06-19 DIAGNOSIS — Z8249 Family history of ischemic heart disease and other diseases of the circulatory system: Secondary | ICD-10-CM | POA: Diagnosis not present

## 2024-06-19 DIAGNOSIS — M009 Pyogenic arthritis, unspecified: Secondary | ICD-10-CM | POA: Diagnosis present

## 2024-06-19 DIAGNOSIS — M86171 Other acute osteomyelitis, right ankle and foot: Secondary | ICD-10-CM | POA: Diagnosis not present

## 2024-06-19 DIAGNOSIS — I340 Nonrheumatic mitral (valve) insufficiency: Secondary | ICD-10-CM | POA: Diagnosis not present

## 2024-06-19 DIAGNOSIS — Z89612 Acquired absence of left leg above knee: Secondary | ICD-10-CM

## 2024-06-19 DIAGNOSIS — Z723 Lack of physical exercise: Secondary | ICD-10-CM

## 2024-06-19 DIAGNOSIS — I7090 Unspecified atherosclerosis: Secondary | ICD-10-CM | POA: Diagnosis not present

## 2024-06-19 DIAGNOSIS — R600 Localized edema: Secondary | ICD-10-CM | POA: Diagnosis not present

## 2024-06-19 DIAGNOSIS — M7989 Other specified soft tissue disorders: Secondary | ICD-10-CM | POA: Diagnosis not present

## 2024-06-19 DIAGNOSIS — E1122 Type 2 diabetes mellitus with diabetic chronic kidney disease: Secondary | ICD-10-CM | POA: Diagnosis present

## 2024-06-19 DIAGNOSIS — M7731 Calcaneal spur, right foot: Secondary | ICD-10-CM | POA: Diagnosis not present

## 2024-06-19 DIAGNOSIS — Z880 Allergy status to penicillin: Secondary | ICD-10-CM | POA: Diagnosis not present

## 2024-06-19 DIAGNOSIS — D631 Anemia in chronic kidney disease: Secondary | ICD-10-CM | POA: Diagnosis not present

## 2024-06-19 DIAGNOSIS — L97519 Non-pressure chronic ulcer of other part of right foot with unspecified severity: Secondary | ICD-10-CM | POA: Diagnosis not present

## 2024-06-19 LAB — CBC WITH DIFFERENTIAL/PLATELET
Abs Immature Granulocytes: 0.02 K/uL (ref 0.00–0.07)
Basophils Absolute: 0 K/uL (ref 0.0–0.1)
Basophils Relative: 0 %
Eosinophils Absolute: 0.1 K/uL (ref 0.0–0.5)
Eosinophils Relative: 2 %
HCT: 28.1 % — ABNORMAL LOW (ref 36.0–46.0)
Hemoglobin: 9.2 g/dL — ABNORMAL LOW (ref 12.0–15.0)
Immature Granulocytes: 0 %
Lymphocytes Relative: 21 %
Lymphs Abs: 1.3 K/uL (ref 0.7–4.0)
MCH: 28.5 pg (ref 26.0–34.0)
MCHC: 32.7 g/dL (ref 30.0–36.0)
MCV: 87 fL (ref 80.0–100.0)
Monocytes Absolute: 0.3 K/uL (ref 0.1–1.0)
Monocytes Relative: 5 %
Neutro Abs: 4.3 K/uL (ref 1.7–7.7)
Neutrophils Relative %: 72 %
Platelets: 426 K/uL — ABNORMAL HIGH (ref 150–400)
RBC: 3.23 MIL/uL — ABNORMAL LOW (ref 3.87–5.11)
RDW: 14.6 % (ref 11.5–15.5)
WBC: 6.1 K/uL (ref 4.0–10.5)
nRBC: 0 % (ref 0.0–0.2)

## 2024-06-19 LAB — COMPREHENSIVE METABOLIC PANEL WITH GFR
ALT: 19 U/L (ref 0–44)
AST: 19 U/L (ref 15–41)
Albumin: 3.3 g/dL — ABNORMAL LOW (ref 3.5–5.0)
Alkaline Phosphatase: 132 U/L — ABNORMAL HIGH (ref 38–126)
Anion gap: 11 (ref 5–15)
BUN: 20 mg/dL (ref 8–23)
CO2: 24 mmol/L (ref 22–32)
Calcium: 9.4 mg/dL (ref 8.9–10.3)
Chloride: 98 mmol/L (ref 98–111)
Creatinine, Ser: 1.23 mg/dL — ABNORMAL HIGH (ref 0.44–1.00)
GFR, Estimated: 47 mL/min — ABNORMAL LOW (ref >60)
Glucose, Bld: 255 mg/dL — ABNORMAL HIGH (ref 70–99)
Potassium: 4.4 mmol/L (ref 3.5–5.1)
Sodium: 133 mmol/L — ABNORMAL LOW (ref 135–145)
Total Bilirubin: 0.5 mg/dL (ref 0.0–1.2)
Total Protein: 7.9 g/dL (ref 6.5–8.1)

## 2024-06-19 LAB — I-STAT CHEM 8, ED
BUN: 22 mg/dL (ref 8–23)
Calcium, Ion: 1.23 mmol/L (ref 1.15–1.40)
Chloride: 100 mmol/L (ref 98–111)
Creatinine, Ser: 1.2 mg/dL — ABNORMAL HIGH (ref 0.44–1.00)
Glucose, Bld: 253 mg/dL — ABNORMAL HIGH (ref 70–99)
HCT: 29 % — ABNORMAL LOW (ref 36.0–46.0)
Hemoglobin: 9.9 g/dL — ABNORMAL LOW (ref 12.0–15.0)
Potassium: 4.5 mmol/L (ref 3.5–5.1)
Sodium: 134 mmol/L — ABNORMAL LOW (ref 135–145)
TCO2: 23 mmol/L (ref 22–32)

## 2024-06-19 LAB — I-STAT CG4 LACTIC ACID, ED
Lactic Acid, Venous: 1 mmol/L (ref 0.5–1.9)
Lactic Acid, Venous: 1 mmol/L (ref 0.5–1.9)

## 2024-06-19 MED ORDER — SODIUM CHLORIDE 0.9 % IV SOLN
3.0000 g | Freq: Once | INTRAVENOUS | Status: AC
  Administered 2024-06-19: 3 g via INTRAVENOUS
  Filled 2024-06-19: qty 8

## 2024-06-19 MED ORDER — SODIUM CHLORIDE 0.9 % IV BOLUS
500.0000 mL | Freq: Once | INTRAVENOUS | Status: AC
  Administered 2024-06-19: 500 mL via INTRAVENOUS

## 2024-06-19 MED ORDER — GADOBUTROL 1 MMOL/ML IV SOLN
5.0000 mL | Freq: Once | INTRAVENOUS | Status: AC | PRN
  Administered 2024-06-19: 5 mL via INTRAVENOUS

## 2024-06-19 MED ORDER — VANCOMYCIN HCL IN DEXTROSE 1-5 GM/200ML-% IV SOLN: 1000.0000 mg | Freq: Once | INTRAVENOUS | Status: DC

## 2024-06-19 MED ORDER — VANCOMYCIN HCL 750 MG/150ML IV SOLN
750.0000 mg | Freq: Once | INTRAVENOUS | Status: DC
  Filled 2024-06-19: qty 150

## 2024-06-19 NOTE — ED Triage Notes (Signed)
 Pt c.o right foot infection.

## 2024-06-19 NOTE — ED Notes (Signed)
 Patient transported to MRI

## 2024-06-19 NOTE — ED Provider Triage Note (Signed)
 Emergency Medicine Provider Triage Evaluation Note  Rexanne Inocencio , a 73 y.o. female  was evaluated in triage.  Pt complains of right foot ulcer.  Review of Systems  Positive: ulcer Negative: fever  Physical Exam  BP (!) 166/74 (BP Location: Right Arm)   Pulse 89   Temp 97.9 F (36.6 C)   Resp 17   Ht 5' 1 (1.549 m)   Wt 47.6 kg   SpO2 98%   BMI 19.84 kg/m  Gen:   Awake, no distress   Resp:  Normal effort  MSK:   Moves extremities without difficulty  Other:  Foot ulcer, DP pulse present  Medical Decision Making  Medically screening exam initiated at 5:31 PM.  Appropriate orders placed.  Lylie Buch was informed that the remainder of the evaluation will be completed by another provider, this initial triage assessment does not replace that evaluation, and the importance of remaining in the ED until their evaluation is complete.     Shermon Warren SAILOR, PA-C 06/19/24 1732

## 2024-06-19 NOTE — Progress Notes (Addendum)
 PHARMACY NOTE:  ANTIMICROBIAL RENAL DOSAGE ADJUSTMENT  Current antimicrobial regimen includes a mismatch between antimicrobial dosage and estimated renal function.  As per policy approved by the Pharmacy & Therapeutics and Medical Executive Committees, the antimicrobial dosage will be adjusted accordingly.  Current antimicrobial dosage:  vancomycin  1000mg  x1. Weight 47.6kg.  Indication: wound infection  Renal Function:  Estimated Creatinine Clearance: 32.3 mL/min (A) (by C-G formula based on SCr of 1.2 mg/dL (H)).     Antimicrobial dosage has been changed to:  750mg  x1    Thank you for allowing pharmacy to be a part of this patient's care.  Jinnie JONETTA Door, [facility] 06/19/2024 9:46 PM

## 2024-06-19 NOTE — ED Notes (Signed)
 Extra Blue, Red, DG, SST & Lav drawn

## 2024-06-19 NOTE — ED Provider Notes (Signed)
 [place] EMERGENCY DEPARTMENT AT [facility] Provider Note   CSN: [id number] Arrival date & time: 06/19/24  1707     Patient presents with: Wound Check   Sigh-Day Beauchaine is a 73 y.o. female with past medical history of congestive heart failure, diabetes, hypertension presents to emergency room with complaint of right foot infection.  Patient reports that for the past 4 weeks she has had an ulcer to the bottom of her right foot associated with cellulitis.  She was seen 05/27/2024 for this and started on Augmentin .  She notes some swelling and pain.  She denies any fever.    Wound Check       Prior to Admission medications   Medication Sig Start Date End Date Taking? Authorizing Provider  amoxicillin -clavulanate (AUGMENTIN ) 875-125 MG tablet Take 1 tablet by mouth every 12 (twelve) hours. 05/27/24   Nivia Colon, PA-C  Ascorbic Acid  (VITAMIN C) 1000 MG tablet Take 1,000 mg by mouth daily.    [provider]  Berberine Chloride 500 MG CAPS Take 1 capsule by mouth in the morning, at noon, and at bedtime.    [provider]  dorzolamide -timolol  (COSOPT ) 22.3-6.8 MG/ML ophthalmic solution Place 1 drop into both eyes 2 (two) times daily. 04/14/22   Elnora Ip, MD  furosemide  (LASIX ) 20 MG tablet Take Lasix  20 mg daily. You may take an addition Lasix  20 mg daily (for a total of 40 mg) as needed for swelling. 06/01/24   Tolia, Sunit, DO  glucose blood (ACCU-CHEK GUIDE) test strip Use as instructed once a day 09/18/22   Ozell Heron HERO, MD  melatonin 3 MG TABS tablet Take 3 mg by mouth at bedtime.    [provider]  metFORMIN (GLUCOPHAGE) 500 MG tablet Take 500 mg by mouth in the morning and at bedtime. 09/13/23   [provider]  oxyCODONE  (ROXICODONE ) 5 MG immediate release tablet Take 1 tablet (5 mg total) by mouth every 6 (six) hours as needed for severe pain (pain score 7-10) or breakthrough pain. 05/09/24   Jamaar Howes N, PA-C   oxyCODONE -acetaminophen  (PERCOCET/ROXICET) 5-325 MG tablet Take 1 tablet by mouth every 8 (eight) hours as needed. 03/06/24   Harden Jerona GAILS, MD  pantoprazole  (PROTONIX ) 40 MG tablet Take 1 tablet (40 mg total) by mouth daily. 08/21/22   Ozell Heron HERO, MD  polyethylene glycol (MIRALAX  / GLYCOLAX ) 17 g packet Take 17 g by mouth daily. 02/12/24   Fairy Frames, MD  rosuvastatin  (CRESTOR ) 20 MG tablet Take 1 tablet (20 mg total) by mouth at bedtime. 03/02/24 05/31/24  Daneen Damien BROCKS, NP  spironolactone  (ALDACTONE ) 25 MG tablet Take 1 tablet (25 mg total) by mouth daily. 03/02/24 05/31/24  Daneen Damien BROCKS, NP  valACYclovir  (VALTREX ) 1000 MG tablet Take 1 tablet (1,000 mg total) by mouth 3 (three) times daily. 05/27/24   Nivia Colon, PA-C    Allergies: Other    Review of Systems  Skin:  Positive for wound.    Updated Vital Signs BP (!) 166/74 (BP Location: Right Arm)   Pulse 89   Temp 97.9 F (36.6 C)   Resp 17   Ht 5' 1 (1.549 m)   Wt 47.6 kg   SpO2 98%   BMI 19.84 kg/m   Physical Exam Vitals and nursing note reviewed.  Constitutional:      General: She is not in acute distress.    Appearance: She is not toxic-appearing.  HENT:     Head: Normocephalic and  atraumatic.  Eyes:     General: No scleral icterus.    Conjunctiva/sclera: Conjunctivae normal.  Cardiovascular:     Rate and Rhythm: Normal rate and regular rhythm.     Pulses: Normal pulses.     Heart sounds: Normal heart sounds.  Pulmonary:     Effort: Pulmonary effort is normal. No respiratory distress.     Breath sounds: Normal breath sounds.  Abdominal:     General: Abdomen is flat. Bowel sounds are normal.     Palpations: Abdomen is soft.     Tenderness: There is no abdominal tenderness.  Musculoskeletal:     Right lower leg: Edema present.     Comments: Left AKA  Skin:    General: Skin is warm and dry.     Findings: No lesion.  Neurological:     General: No focal deficit present.     Mental Status: She is  alert and oriented to person, place, and time. Mental status is at baseline.     (all labs ordered are listed, but only abnormal results are displayed) Labs Reviewed  CBC WITH DIFFERENTIAL/PLATELET - Abnormal; Notable for the following components:      Result Value   RBC 3.23 (*)    Hemoglobin 9.2 (*)    HCT 28.1 (*)    Platelets 426 (*)    All other components within normal limits  COMPREHENSIVE METABOLIC PANEL WITH GFR - Abnormal; Notable for the following components:   Sodium 133 (*)    Glucose, Bld 255 (*)    Creatinine, Ser 1.23 (*)    Albumin  3.3 (*)    Alkaline Phosphatase 132 (*)    GFR, Estimated 47 (*)    All other components within normal limits  I-STAT CHEM 8, ED - Abnormal; Notable for the following components:   Sodium 134 (*)    Creatinine, Ser 1.20 (*)    Glucose, Bld 253 (*)    Hemoglobin 9.9 (*)    HCT 29.0 (*)    All other components within normal limits  CULTURE, BLOOD (ROUTINE X 2)  CULTURE, BLOOD (ROUTINE X 2)  I-STAT CG4 LACTIC ACID, ED  I-STAT CG4 LACTIC ACID, ED    EKG: None  Radiology: DG Foot Complete Right Result Date: 06/19/2024 CLINICAL DATA:  Fifth toe ulcer EXAM: RIGHT FOOT COMPLETE - 3+ VIEW COMPARISON:  Radiographs 05/27/2024 FINDINGS: Progressive bony demineralization with cortical destruction in the head of the fifth metatarsal, highly suspicious for active osteomyelitis. Possible involvement of the base of the proximal phalanx small toe. Generalized bony demineralization in the foot noted. Atheromatous vascular calcifications. Small plantar and Achilles calcaneal spurs. Suspected infiltrative subcutaneous edema in the distal calf. Dorsal infiltrative edema in the forefoot. IMPRESSION: 1. New bony demineralization with cortical destruction in the head of the fifth metatarsal, highly suspicious for active osteomyelitis given the clinical context. Possible involvement of the base of the proximal phalanx small toe. 2. Generalized bony  demineralization. 3. Atheromatous vascular calcifications. 4. Suspected infiltrative subcutaneous edema in the distal calf and dorsal forefoot. Electronically Signed   By: Ryan Salvage M.D.   On: 06/19/2024 18:42     Procedures   Medications Ordered in the ED  Ampicillin -Sulbactam (UNASYN ) 3 g in sodium chloride  0.9 % 100 mL IVPB (has no administration in time range)  sodium chloride  0.9 % bolus 500 mL (has no administration in time range)  vancomycin  (VANCOREADY) IVPB 750 mg/150 mL (has no administration in time range)  Medical Decision Making Amount and/or Complexity of Data Reviewed Labs: ordered. Radiology: ordered.  Risk Prescription drug management. Decision regarding hospitalization.   This patient presents to the ED for concern of ulcer, this involves an extensive number of treatment options, and is a complaint that carries with it a high risk of complications and morbidity.  The differential diagnosis includes ulcer, cellulitis, osteomyelitis, deep space infection   Co morbidities that complicate the patient evaluation  DM   Lab Tests:  I personally interpreted labs.  The pertinent results include:   No leukocytosis.  Hemoglobin is stable at 9.2.  Mild hyponatremia.  Creatinine is 1.23 with GFR of 47. Mild AKI will give fluids Elevated glucose no anion gap Blood cultures are pending.   Imaging Studies ordered:  I ordered imaging studies including right foot x-ray I independently visualized and interpreted imaging which showed fifth metatarsal osteomyelitis with possible involvement in phalanx as well.  Subcutaneous edema. I agree with the radiologist interpretation   Cardiac Monitoring: / EKG:  The patient was maintained on a cardiac monitor.     Consultations Obtained:  I requested consultation with the hosptialist,  and discussed lab and imaging findings as well as pertinent plan - they recommend:  admit    Problem List / ED Course / Critical interventions / Medication management  Patient reporting to emergency room with complaint of ulcer.  This has been ongoing for approximately 4 weeks.  She does have palpable dorsal pedal pulse, good cap refill.  Sensation is intact.  She is hemodynamically stable and afebrile with this.  She had trial of outpatient Augmentin  without significant improvement in symptoms.  Her lab work here shows no leukocytosis.  Blood cultures are pending.  X-rays consistent with osteomyelitis.  Given concern for osteo will admit for MRI and orthopedic consult as well as IV antibiotic. I ordered medication including Unasyn , vancomycin  Reevaluation of the patient after these medicines showed that the patient stayed the same I have reviewed the patients home medicines and have made adjustments as needed        Final diagnoses:  Osteomyelitis of foot, unspecified laterality, unspecified type (HCC)  AKI (acute kidney injury) [facility])    ED Discharge Orders     None          Shermon Warren LOISE DEVONNA 06/19/24 2234    Rogelia Jerilynn RAMAN, MD 06/20/24 [phone]

## 2024-06-20 ENCOUNTER — Inpatient Hospital Stay (HOSPITAL_COMMUNITY)

## 2024-06-20 DIAGNOSIS — E1159 Type 2 diabetes mellitus with other circulatory complications: Secondary | ICD-10-CM | POA: Diagnosis not present

## 2024-06-20 DIAGNOSIS — E1165 Type 2 diabetes mellitus with hyperglycemia: Secondary | ICD-10-CM

## 2024-06-20 DIAGNOSIS — I739 Peripheral vascular disease, unspecified: Secondary | ICD-10-CM | POA: Diagnosis not present

## 2024-06-20 DIAGNOSIS — L089 Local infection of the skin and subcutaneous tissue, unspecified: Secondary | ICD-10-CM | POA: Diagnosis present

## 2024-06-20 DIAGNOSIS — L97519 Non-pressure chronic ulcer of other part of right foot with unspecified severity: Secondary | ICD-10-CM | POA: Diagnosis present

## 2024-06-20 DIAGNOSIS — M869 Osteomyelitis, unspecified: Secondary | ICD-10-CM | POA: Diagnosis present

## 2024-06-20 DIAGNOSIS — Z8249 Family history of ischemic heart disease and other diseases of the circulatory system: Secondary | ICD-10-CM | POA: Diagnosis not present

## 2024-06-20 DIAGNOSIS — E11621 Type 2 diabetes mellitus with foot ulcer: Secondary | ICD-10-CM | POA: Diagnosis present

## 2024-06-20 DIAGNOSIS — N182 Chronic kidney disease, stage 2 (mild): Secondary | ICD-10-CM | POA: Diagnosis present

## 2024-06-20 DIAGNOSIS — Z89612 Acquired absence of left leg above knee: Secondary | ICD-10-CM | POA: Diagnosis not present

## 2024-06-20 DIAGNOSIS — Z79899 Other long term (current) drug therapy: Secondary | ICD-10-CM | POA: Diagnosis not present

## 2024-06-20 DIAGNOSIS — M86171 Other acute osteomyelitis, right ankle and foot: Secondary | ICD-10-CM

## 2024-06-20 DIAGNOSIS — Z880 Allergy status to penicillin: Secondary | ICD-10-CM | POA: Diagnosis not present

## 2024-06-20 DIAGNOSIS — M009 Pyogenic arthritis, unspecified: Secondary | ICD-10-CM | POA: Diagnosis present

## 2024-06-20 DIAGNOSIS — I3139 Other pericardial effusion (noninflammatory): Secondary | ICD-10-CM | POA: Diagnosis present

## 2024-06-20 DIAGNOSIS — Z723 Lack of physical exercise: Secondary | ICD-10-CM | POA: Diagnosis not present

## 2024-06-20 DIAGNOSIS — E785 Hyperlipidemia, unspecified: Secondary | ICD-10-CM | POA: Diagnosis present

## 2024-06-20 DIAGNOSIS — Z7984 Long term (current) use of oral hypoglycemic drugs: Secondary | ICD-10-CM | POA: Diagnosis not present

## 2024-06-20 DIAGNOSIS — I130 Hypertensive heart and chronic kidney disease with heart failure and stage 1 through stage 4 chronic kidney disease, or unspecified chronic kidney disease: Secondary | ICD-10-CM | POA: Diagnosis present

## 2024-06-20 DIAGNOSIS — K219 Gastro-esophageal reflux disease without esophagitis: Secondary | ICD-10-CM | POA: Diagnosis present

## 2024-06-20 DIAGNOSIS — E1151 Type 2 diabetes mellitus with diabetic peripheral angiopathy without gangrene: Secondary | ICD-10-CM | POA: Diagnosis present

## 2024-06-20 DIAGNOSIS — E1122 Type 2 diabetes mellitus with diabetic chronic kidney disease: Secondary | ICD-10-CM | POA: Diagnosis present

## 2024-06-20 DIAGNOSIS — D631 Anemia in chronic kidney disease: Secondary | ICD-10-CM | POA: Diagnosis present

## 2024-06-20 DIAGNOSIS — I7090 Unspecified atherosclerosis: Secondary | ICD-10-CM

## 2024-06-20 DIAGNOSIS — L02611 Cutaneous abscess of right foot: Secondary | ICD-10-CM | POA: Diagnosis not present

## 2024-06-20 DIAGNOSIS — I340 Nonrheumatic mitral (valve) insufficiency: Secondary | ICD-10-CM | POA: Diagnosis present

## 2024-06-20 DIAGNOSIS — E1169 Type 2 diabetes mellitus with other specified complication: Secondary | ICD-10-CM | POA: Diagnosis present

## 2024-06-20 DIAGNOSIS — I5023 Acute on chronic systolic (congestive) heart failure: Secondary | ICD-10-CM | POA: Diagnosis present

## 2024-06-20 DIAGNOSIS — Z5329 Procedure and treatment not carried out because of patient's decision for other reasons: Secondary | ICD-10-CM | POA: Diagnosis present

## 2024-06-20 LAB — PHOSPHORUS: Phosphorus: 2.7 mg/dL (ref 2.5–4.6)

## 2024-06-20 LAB — CBC
HCT: 24.8 % — ABNORMAL LOW (ref 36.0–46.0)
Hemoglobin: 8 g/dL — ABNORMAL LOW (ref 12.0–15.0)
MCH: 27.7 pg (ref 26.0–34.0)
MCHC: 32.3 g/dL (ref 30.0–36.0)
MCV: 85.8 fL (ref 80.0–100.0)
Platelets: 376 K/uL (ref 150–400)
RBC: 2.89 MIL/uL — ABNORMAL LOW (ref 3.87–5.11)
RDW: 14.4 % (ref 11.5–15.5)
WBC: 7.8 K/uL (ref 4.0–10.5)
nRBC: 0 % (ref 0.0–0.2)

## 2024-06-20 LAB — BASIC METABOLIC PANEL WITH GFR
Anion gap: 12 (ref 5–15)
BUN: 20 mg/dL (ref 8–23)
CO2: 22 mmol/L (ref 22–32)
Calcium: 8.9 mg/dL (ref 8.9–10.3)
Chloride: 100 mmol/L (ref 98–111)
Creatinine, Ser: 1.05 mg/dL — ABNORMAL HIGH (ref 0.44–1.00)
GFR, Estimated: 57 mL/min — ABNORMAL LOW (ref >60)
Glucose, Bld: 216 mg/dL — ABNORMAL HIGH (ref 70–99)
Potassium: 4.1 mmol/L (ref 3.5–5.1)
Sodium: 134 mmol/L — ABNORMAL LOW (ref 135–145)

## 2024-06-20 LAB — HEMOGLOBIN A1C
Hgb A1c MFr Bld: 8.4 % — ABNORMAL HIGH (ref 4.8–5.6)
Mean Plasma Glucose: 194.38 mg/dL

## 2024-06-20 LAB — GLUCOSE, CAPILLARY
Glucose-Capillary: 205 mg/dL — ABNORMAL HIGH (ref 70–99)
Glucose-Capillary: 201 mg/dL — ABNORMAL HIGH (ref 70–99)
Glucose-Capillary: 119 mg/dL — ABNORMAL HIGH (ref 70–99)
Glucose-Capillary: 108 mg/dL — ABNORMAL HIGH (ref 70–99)
Glucose-Capillary: 178 mg/dL — ABNORMAL HIGH (ref 70–99)

## 2024-06-20 LAB — LIPID PANEL
Cholesterol: 148 mg/dL (ref 0–200)
HDL: 44 mg/dL (ref >40)
LDL Cholesterol: 82 mg/dL (ref 0–99)
Total CHOL/HDL Ratio: 3.4 ratio
Triglycerides: 112 mg/dL (ref <150)
VLDL: 22 mg/dL (ref 0–40)

## 2024-06-20 LAB — MAGNESIUM: Magnesium: 1.6 mg/dL — ABNORMAL LOW (ref 1.7–2.4)

## 2024-06-20 LAB — SEDIMENTATION RATE: Sed Rate: 94 mm/h — ABNORMAL HIGH (ref 0–22)

## 2024-06-20 LAB — C-REACTIVE PROTEIN: CRP: 1.7 mg/dL — ABNORMAL HIGH (ref <1.0)

## 2024-06-20 LAB — BRAIN NATRIURETIC PEPTIDE: B Natriuretic Peptide: 125.1 pg/mL — ABNORMAL HIGH (ref 0.0–100.0)

## 2024-06-20 MED ORDER — INSULIN ASPART 100 UNIT/ML IJ SOLN
0.0000 [IU] | Freq: Three times a day (TID) | INTRAMUSCULAR | Status: DC
  Administered 2024-06-20 – 2024-06-21 (×2): 2 [IU] via SUBCUTANEOUS

## 2024-06-20 MED ORDER — ONDANSETRON HCL 4 MG/2ML IJ SOLN: 4.0000 mg | Freq: Four times a day (QID) | INTRAMUSCULAR | Status: DC | PRN

## 2024-06-20 MED ORDER — FUROSEMIDE 20 MG PO TABS
20.0000 mg | ORAL_TABLET | Freq: Every day | ORAL | Status: DC
  Administered 2024-06-20 – 2024-06-26 (×7): 20 mg via ORAL
  Filled 2024-06-20 (×7): qty 1

## 2024-06-20 MED ORDER — SPIRONOLACTONE 25 MG PO TABS
25.0000 mg | ORAL_TABLET | Freq: Every day | ORAL | Status: DC
  Administered 2024-06-20 – 2024-06-26 (×7): 25 mg via ORAL
  Filled 2024-06-20 (×7): qty 1

## 2024-06-20 MED ORDER — TRAMADOL HCL 50 MG PO TABS: 50.0000 mg | ORAL_TABLET | Freq: Two times a day (BID) | ORAL | Status: DC | PRN

## 2024-06-20 MED ORDER — DORZOLAMIDE HCL-TIMOLOL MAL 2-0.5 % OP SOLN
1.0000 [drp] | Freq: Two times a day (BID) | OPHTHALMIC | Status: DC
  Administered 2024-06-20 – 2024-06-26 (×13): 1 [drp] via OPHTHALMIC
  Filled 2024-06-20: qty 10

## 2024-06-20 MED ORDER — SODIUM CHLORIDE 0.9 % IV SOLN: 3.0000 g | Freq: Three times a day (TID) | INTRAVENOUS | Status: DC

## 2024-06-20 MED ORDER — SODIUM CHLORIDE 0.9% FLUSH
3.0000 mL | Freq: Two times a day (BID) | INTRAVENOUS | Status: DC
  Administered 2024-06-20 – 2024-06-26 (×14): 3 mL via INTRAVENOUS

## 2024-06-20 MED ORDER — INSULIN GLARGINE 100 UNIT/ML ~~LOC~~ SOLN
6.0000 [IU] | Freq: Every day | SUBCUTANEOUS | Status: DC
  Administered 2024-06-20 – 2024-06-21 (×3): 6 [IU] via SUBCUTANEOUS
  Filled 2024-06-20 (×8): qty 0.06

## 2024-06-20 MED ORDER — PIPERACILLIN-TAZOBACTAM 3.375 G IVPB
3.3750 g | Freq: Three times a day (TID) | INTRAVENOUS | Status: DC
  Administered 2024-06-20 – 2024-06-21 (×4): 3.375 g via INTRAVENOUS
  Filled 2024-06-20 (×4): qty 50

## 2024-06-20 MED ORDER — VANCOMYCIN HCL 1250 MG/250ML IV SOLN
1250.0000 mg | INTRAVENOUS | Status: DC
  Administered 2024-06-20: 1250 mg via INTRAVENOUS
  Filled 2024-06-20: qty 250

## 2024-06-20 MED ORDER — MELATONIN 3 MG PO TABS
6.0000 mg | ORAL_TABLET | Freq: Every evening | ORAL | Status: DC | PRN
  Administered 2024-06-21 – 2024-06-25 (×4): 6 mg via ORAL
  Filled 2024-06-20 (×4): qty 2

## 2024-06-20 MED ORDER — FUROSEMIDE 10 MG/ML IJ SOLN
20.0000 mg | Freq: Once | INTRAMUSCULAR | Status: AC
  Administered 2024-06-20: 20 mg via INTRAVENOUS
  Filled 2024-06-20: qty 2

## 2024-06-20 MED ORDER — PANTOPRAZOLE SODIUM 40 MG PO TBEC
40.0000 mg | DELAYED_RELEASE_TABLET | Freq: Two times a day (BID) | ORAL | Status: DC
  Administered 2024-06-20 – 2024-06-26 (×13): 40 mg via ORAL
  Filled 2024-06-20 (×13): qty 1

## 2024-06-20 MED ORDER — HEPARIN SODIUM (PORCINE) 5000 UNIT/ML IJ SOLN
5000.0000 [IU] | Freq: Two times a day (BID) | INTRAMUSCULAR | Status: DC
  Administered 2024-06-20 – 2024-06-26 (×13): 5000 [IU] via SUBCUTANEOUS
  Filled 2024-06-20 (×13): qty 1

## 2024-06-20 MED ORDER — ALBUTEROL SULFATE (2.5 MG/3ML) 0.083% IN NEBU: 2.5000 mg | INHALATION_SOLUTION | RESPIRATORY_TRACT | Status: DC | PRN

## 2024-06-20 MED ORDER — ACETAMINOPHEN 500 MG PO TABS
1000.0000 mg | ORAL_TABLET | Freq: Four times a day (QID) | ORAL | Status: DC | PRN
  Administered 2024-06-22: 500 mg via ORAL
  Administered 2024-06-24 – 2024-06-25 (×2): 1000 mg via ORAL
  Filled 2024-06-20 (×3): qty 2

## 2024-06-20 MED ORDER — POLYETHYLENE GLYCOL 3350 17 G PO PACK
17.0000 g | PACK | Freq: Every day | ORAL | Status: DC | PRN
  Administered 2024-06-21: 17 g via ORAL
  Filled 2024-06-20: qty 1

## 2024-06-20 MED ORDER — ROSUVASTATIN CALCIUM 20 MG PO TABS
20.0000 mg | ORAL_TABLET | Freq: Every day | ORAL | Status: DC
  Administered 2024-06-20 – 2024-06-25 (×6): 20 mg via ORAL
  Filled 2024-06-20 (×6): qty 1

## 2024-06-20 MED ORDER — NYSTATIN 100000 UNIT/GM EX POWD
1.0000 | Freq: Three times a day (TID) | CUTANEOUS | Status: DC
  Administered 2024-06-20 – 2024-06-26 (×19): 1 via TOPICAL
  Filled 2024-06-20: qty 15

## 2024-06-20 NOTE — Progress Notes (Signed)
    [name] FMW:[id number] DOB: [birth year]      Brief hospital course: 73 y.o. F with PVD s/p L AKA, DM poorly controlled, HTN presented with non-healing DFI.  Found to have osteo of the 5th metatarsal head on MRI.      This is a no charge note, for further details, please see the H&P by my partner, Dr. Keturah from earlier today.   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   - Continue Vancomyicn and Zosyn   Diabetes Glucose controlled - Continue glargine, SSI - Hold losartan , Jardiance   HFmrEF Resolved acute flare. - Continue PO furosemide , spiro  PVD HTN HLD - Continue furosemide  spiro, Crestor   CKD II  Anemia Stable        Physical Exam: BP (!) 132/53 (BP Location: Right Arm)   Pulse 79   Temp 97.6 F (36.4 C) (Oral)   Resp 16   Ht 5' 1 (1.549 m)   Wt 47.6 kg   SpO2 97%   BMI 19.84 kg/m   Patient seen and examined.     Family Communication: Husband        Author: [name], MD 06/20/2024 5:38 PM

## 2024-06-20 NOTE — H&P (Signed)
 History and Physical    Jill Munoz:[id number] DOB: 03-Jun-1951 DOA: 06/19/2024  PCP: Jill Munoz   Patient coming from: Home   Chief Complaint:  Chief Complaint  Patient presents with   Wound Check    HPI:  Jill Munoz is a 73 y.o. female with hx of DM type 2, PAD, L AKA, HFmrEF, HTN, IDA, recent ED visit with concern for DM foot ulcer and associated cellulitis seen on 8/13 with no evidence of osteomyelitis at that time and was discharged on course of Augmentin , reportedly referred to Podiatry although had not seen, Rx'd course of Bactrim and Doxycycline  since 8/26 by PCP. Presented due to worsening pain in her R foot wound.   Reports that wound over the plantar surface lateral side of foot has been there for approximately last 3 weeks, after an area of callus had been removed.  Over time wound has worsened, was seen in the ED and prescribed recent antibiotics per above.  Appeared that surrounding skin changes had improved although wound has not healed.  Due to worsening pain last night presented to the ED.  Does have swelling in the right leg but no significant shortness of breath similar to prior heart failure exacerbation.  No fever or chills.   Review of Systems:  ROS complete and negative except as marked above   Allergies  Allergen Reactions   Other     States she is allergic to abx but does not know name    Amoxicillin  Diarrhea    Prior to Admission medications   Medication Sig Start Date End Date Taking? Authorizing Provider  Ascorbic Acid  (VITAMIN C) 1000 MG tablet Take 1,000 mg by mouth daily.   Yes Provider, Historical, Munoz  BACTRIM DS 800-160 MG tablet Take 1 tablet by mouth 2 (two) times daily. 06/09/24  Yes Provider, Historical, Munoz  Berberine Chloride 500 MG CAPS Take 1 capsule by mouth in the morning, at noon, and at bedtime.   Yes Provider, Historical, Munoz  dorzolamide -timolol  (COSOPT ) 22.3-6.8 MG/ML ophthalmic solution Place 1 drop into both eyes 2  (two) times daily. 04/14/22  Yes Jill Munoz  doxycycline  (DORYX ) 100 MG EC tablet Take 100 mg by mouth 2 (two) times daily. 06/17/24  Yes Provider, Historical, Munoz  furosemide  (LASIX ) 20 MG tablet Take Lasix  20 mg daily. You may take an addition Lasix  20 mg daily (for a total of 40 mg) as needed for swelling. 06/01/24  Yes Jill Munoz  JARDIANCE  10 MG TABS tablet Take 10 mg by mouth daily. 05/25/24  Yes Provider, Historical, Munoz  losartan  (COZAAR ) 50 MG tablet Take 50 mg by mouth daily. 05/12/24  Yes Provider, Historical, Munoz  melatonin 3 MG TABS tablet Take 3 mg by mouth at bedtime.   Yes Provider, Historical, Munoz  omeprazole (PRILOSEC) 40 MG capsule Take 60 mg by mouth in the morning and at bedtime. 04/06/24  Yes Provider, Historical, Munoz  pantoprazole  (PROTONIX ) 40 MG tablet Take 1 tablet (40 mg total) by mouth daily. 08/21/22  Yes Jill Munoz  promethazine  (PHENERGAN ) 12.5 MG tablet Take 12.5 mg by mouth every 6 (six) hours as needed for vomiting. 05/22/24  Yes Provider, Historical, Munoz  rosuvastatin  (CRESTOR ) 20 MG tablet Take 1 tablet (20 mg total) by mouth at bedtime. 03/02/24 06/19/24 Yes Jill Munoz  spironolactone  (ALDACTONE ) 25 MG tablet Take 1 tablet (25 mg total) by mouth daily. 03/02/24 06/19/24 Yes Jill Munoz  traMADol  (ULTRAM ) 50 MG  tablet Take 50 mg by mouth every 6 (six) hours as needed for moderate pain (pain score 4-6). 05/18/24  Yes Provider, Historical, Munoz  amoxicillin -clavulanate (AUGMENTIN ) 875-125 MG tablet Take 1 tablet by mouth every 12 (twelve) hours. Patient not taking: Reported on 06/19/2024 05/27/24   Jill Munoz  glucose blood (ACCU-CHEK GUIDE) test strip Use as instructed once a day 09/18/22   Jill Munoz  oxyCODONE  (ROXICODONE ) 5 MG immediate release tablet Take 1 tablet (5 mg total) by mouth every 6 (six) hours as needed for severe pain (pain score 7-10) or breakthrough pain. Patient not taking: Reported on 06/19/2024 05/09/24    Barrett, Jamie N, Munoz  oxyCODONE -acetaminophen  (PERCOCET/ROXICET) 5-325 MG tablet Take 1 tablet by mouth every 8 (eight) hours as needed. Patient not taking: Reported on 06/19/2024 03/06/24   Jill Munoz  valACYclovir  (VALTREX ) 1000 MG tablet Take 1 tablet (1,000 mg total) by mouth 3 (three) times daily. Patient not taking: Reported on 06/19/2024 05/27/24   Jill Munoz    Past Medical History:  Diagnosis Date   Anemia    Diabetes mellitus without complication (HCC)    Pre-diabetes     Past Surgical History:  Procedure Laterality Date   AMPUTATION  04/06/2022   Procedure: AMPUTATION ABOVE KNEE REVISION;  Surgeon: Jill Munoz;  Location: [facility] OR;  Service: Orthopedics;;   I & D EXTREMITY Left 04/02/2022   Procedure: IRRIGATION AND DEBRIDEMENT LEFT LOWER LEG APPLICATION OF WOUND VAC ;  Surgeon: Jill Munoz;  Location: [facility] OR;  Service: Orthopedics;  Laterality: Left;   I & D EXTREMITY Left 04/04/2022   Procedure: LEFT ABOVE KNEE AMPUTATION;  Surgeon: Jill Munoz;  Location: [facility] OR;  Service: Orthopedics;  Laterality: Left;   I & D EXTREMITY Left 04/06/2022   Procedure: LEFT LEG DEBRIDEMENT;  Surgeon: Jill Munoz;  Location: [facility] OR;  Service: Orthopedics;  Laterality: Left;   STUMP REVISION Left 04/11/2022   Procedure: REVISION LEFT ABOVE KNEE AMPUTATION;  Surgeon: Jill Munoz;  Location: [facility] OR;  Service: Orthopedics;  Laterality: Left;     reports that she has never smoked. She has never used smokeless tobacco. She reports that she does not drink alcohol  and does not use drugs.  Family History  Problem Relation Age of Onset   Heart attack Mother    Heart disease Mother      Physical Exam: Vitals:   06/19/24 1713 06/19/24 1728 06/19/24 2205  BP: (!) 166/74  (!) 157/68  Pulse: 89  85  Resp: 17  18  Temp: 97.9 F (36.6 C)  98 F (36.7 C)  SpO2: 98%  100%  Weight:  47.6 kg   Height:  5' 1 (1.549 m)     Gen: Awake, alert, NAD   CV:  Regular, normal S1, S2, no murmurs, nonpalpable pedal pulses on the right Resp: Normal WOB, CTAB  Abd: Flat, normoactive, nontender MSK: Left AKA, 2+ pitting edema in the right leg to the knee Skin: On plantar surface of the foot near the fifth MT head there is a approximate 3 cm ulceration unstageable with a greenish/yellow purulence in wound bed Neuro: Alert and interactive  Psych: euthymic, appropriate    Data review:   Labs reviewed, notable for:   BG 255  Cr 1.23 (b/l 0.8 - 0.9)  Alk phos 132, other LFT wnl  Lactate 1  WBC 6  Hb 9, normocytic (b/l ~ 10 )  Micro:  Results for orders placed or performed during the hospital encounter of 04/02/22  Culture, blood (Routine x 2)     Status: Abnormal   Collection Time: 04/02/22  7:30 AM   Specimen: BLOOD LEFT HAND  Result Value Ref Range Status   Specimen Description BLOOD LEFT HAND  Final   Special Requests   Final    BOTTLES DRAWN AEROBIC AND ANAEROBIC Blood Culture results may not be optimal due to an inadequate volume of blood received in culture bottles   Culture  Setup Time   Final    GRAM POSITIVE COCCI IN BOTH AEROBIC AND ANAEROBIC BOTTLES CRITICAL RESULT CALLED TO, READ BACK BY AND VERIFIED WITH: PHARMD JAMES LEDFORD 04/02/22@22 :35 BY TW    Culture (A)  Final    STREPTOCOCCUS PYOGENES HEALTH DEPARTMENT NOTIFIED Performed at [facility], [address], [place], [place] [address]    Report Status 04/04/2022 FINAL  Final   Organism ID, Bacteria STREPTOCOCCUS PYOGENES  Final      Susceptibility   Streptococcus pyogenes - MIC*    PENICILLIN  <=0.06 SENSITIVE Sensitive     CEFTRIAXONE  <=0.12 SENSITIVE Sensitive     ERYTHROMYCIN <=0.12 SENSITIVE Sensitive     LEVOFLOXACIN <=0.25 SENSITIVE Sensitive     VANCOMYCIN  <=0.12 SENSITIVE Sensitive     * STREPTOCOCCUS PYOGENES  Blood Culture ID Panel (Reflexed)     Status: Abnormal   Collection Time: 04/02/22  7:30 AM  Result Value Ref Range Status   Enterococcus  faecalis NOT DETECTED NOT DETECTED Final   Enterococcus Faecium NOT DETECTED NOT DETECTED Final   Listeria monocytogenes NOT DETECTED NOT DETECTED Final   Staphylococcus species NOT DETECTED NOT DETECTED Final   Staphylococcus aureus (BCID) NOT DETECTED NOT DETECTED Final   Staphylococcus epidermidis NOT DETECTED NOT DETECTED Final   Staphylococcus lugdunensis NOT DETECTED NOT DETECTED Final   Streptococcus species DETECTED (A) NOT DETECTED Final    Comment: CRITICAL RESULT CALLED TO, READ BACK BY AND VERIFIED WITH: PHARMD JAMES LEDFORD 04/02/22@22 :35 BY TW    Streptococcus agalactiae NOT DETECTED NOT DETECTED Final   Streptococcus pneumoniae NOT DETECTED NOT DETECTED Final   Streptococcus pyogenes DETECTED (A) NOT DETECTED Final    Comment: CRITICAL RESULT CALLED TO, READ BACK BY AND VERIFIED WITH: PHARMD JAMES LEDFORD 04/02/22@22 :35 BY TW    A.calcoaceticus-baumannii NOT DETECTED NOT DETECTED Final   Bacteroides fragilis NOT DETECTED NOT DETECTED Final   Enterobacterales NOT DETECTED NOT DETECTED Final   Enterobacter cloacae complex NOT DETECTED NOT DETECTED Final   Escherichia coli NOT DETECTED NOT DETECTED Final   Klebsiella aerogenes NOT DETECTED NOT DETECTED Final   Klebsiella oxytoca NOT DETECTED NOT DETECTED Final   Klebsiella pneumoniae NOT DETECTED NOT DETECTED Final   Proteus species NOT DETECTED NOT DETECTED Final   Salmonella species NOT DETECTED NOT DETECTED Final   Serratia marcescens NOT DETECTED NOT DETECTED Final   Haemophilus influenzae NOT DETECTED NOT DETECTED Final   Neisseria meningitidis NOT DETECTED NOT DETECTED Final   Pseudomonas aeruginosa NOT DETECTED NOT DETECTED Final   Stenotrophomonas maltophilia NOT DETECTED NOT DETECTED Final   Candida albicans NOT DETECTED NOT DETECTED Final   Candida auris NOT DETECTED NOT DETECTED Final   Candida glabrata NOT DETECTED NOT DETECTED Final   Candida krusei NOT DETECTED NOT DETECTED Final   Candida parapsilosis NOT  DETECTED NOT DETECTED Final   Candida tropicalis NOT DETECTED NOT DETECTED Final   Cryptococcus neoformans/gattii NOT DETECTED NOT DETECTED Final    Comment:  Performed at [facility], [address] [place], [place] [address]  Culture, blood (Routine x 2)     Status: None   Collection Time: 04/02/22  9:37 AM   Specimen: BLOOD RIGHT HAND  Result Value Ref Range Status   Specimen Description BLOOD RIGHT HAND  Final   Special Requests   Final    BOTTLES DRAWN AEROBIC ONLY Blood Culture results may not be optimal due to an inadequate volume of blood received in culture bottles   Culture   Final    NO GROWTH 5 DAYS Performed at [facility], [address], [place], [place] [address]    Report Status 04/07/2022 FINAL  Final  Surgical pcr screen     Status: Abnormal   Collection Time: 04/02/22 11:44 AM   Specimen: Nasal Mucosa; Nasal Swab  Result Value Ref Range Status   MRSA, PCR NEGATIVE NEGATIVE Final   Staphylococcus aureus POSITIVE (A) NEGATIVE Final    Comment: (NOTE) The Xpert SA Assay (FDA approved for NASAL specimens in patients 83 years of age and older), is one component of a comprehensive surveillance program. It is not intended to diagnose infection nor to guide or monitor treatment. Performed at [facility], [address], [place], [place] [address]   Fungus Culture With Stain     Status: Abnormal   Collection Time: 04/02/22  1:23 PM   Specimen: PATH Other; Body Fluid  Result Value Ref Range Status   Fungus Stain Final report  Final   Fungus (Mycology) Culture Final report (A)  Corrected    Comment: (NOTE) Performed At: [facility] [address] [place], [place] [id number] Jennette Shorter Munoz Ey:[phone] CORRECTED ON 07/21 AT 1036: PREVIOUSLY REPORTED AS Preliminary report    Fungal Source WOUND  Final    Comment: Performed at [facility] Lab, [address] [place], [place] [address]  Aerobic/Anaerobic Culture w Gram Stain  (surgical/deep wound)     Status: None   Collection Time: 04/02/22  1:23 PM   Specimen: PATH Other; Body Fluid  Result Value Ref Range Status   Specimen Description WOUND  Final   Special Requests LEFT LEG BULLA FLUID TEST ANCEF   Final   Gram Stain NO WBC SEEN RARE GRAM POSITIVE COCCI IN PAIRS   Final   Culture   Final    FEW GROUP A STREP (S.PYOGENES) ISOLATED Beta hemolytic streptococci are predictably susceptible to penicillin  and other beta lactams. Susceptibility testing not routinely performed. RARE STAPHYLOCOCCUS LUGDUNENSIS NO ANAEROBES ISOLATED Performed at [facility], [address] [place], [place] [address]    Report Status 04/08/2022 FINAL  Final   Organism ID, Bacteria STAPHYLOCOCCUS LUGDUNENSIS  Final      Susceptibility   Staphylococcus lugdunensis - MIC*    CIPROFLOXACIN <=0.5 SENSITIVE Sensitive     ERYTHROMYCIN <=0.25 SENSITIVE Sensitive     GENTAMICIN <=0.5 SENSITIVE Sensitive     OXACILLIN 2 SENSITIVE Sensitive     TETRACYCLINE <=1 SENSITIVE Sensitive     VANCOMYCIN  <=0.5 SENSITIVE Sensitive     TRIMETH/SULFA <=10 SENSITIVE Sensitive     CLINDAMYCIN <=0.25 SENSITIVE Sensitive     RIFAMPIN <=0.5 SENSITIVE Sensitive     Inducible Clindamycin NEGATIVE Sensitive     * RARE STAPHYLOCOCCUS LUGDUNENSIS  Fungus Culture Result     Status: None   Collection Time: 04/02/22  1:23 PM  Result Value Ref Range Status   Result 1 Comment  Final  Comment: (NOTE) KOH/Calcofluor preparation:  no fungus observed. Performed At: [facility] [address] [place], [place] [id number] Jennette Shorter Munoz [phone]   Fungal organism reflex     Status: Abnormal   Collection Time: 04/02/22  1:23 PM  Result Value Ref Range Status   Fungal result 1 Comment (A)  Final    Comment: (NOTE) Candida guilliermondii Scant growth Performed At: [facility] [address] [place], [place] [id number] Jennette Shorter Munoz [phone]   Fungus Culture With  Stain     Status: None   Collection Time: 04/02/22  1:30 PM   Specimen: PATH Other; Tissue  Result Value Ref Range Status   Fungus Stain Final report  Final   Fungus (Mycology) Culture Final report  Final    Comment: (NOTE) Performed At: [facility] [address] [place], [place] [id number] Jennette Shorter Munoz [phone]    Fungal Source WOUND  Final    Comment: Performed at [facility], [address], [place], [place] [address]  Aerobic/Anaerobic Culture w Gram Stain (surgical/deep wound)     Status: None   Collection Time: 04/02/22  1:30 PM   Specimen: PATH Other; Tissue  Result Value Ref Range Status   Specimen Description WOUND  Final   Special Requests LEFT LEG PROXIMAL MEDIAL DEEP TEST ANCEF   Final   Gram Stain   Final    FEW WBC PRESENT,BOTH PMN AND MONONUCLEAR ABUNDANT GRAM POSITIVE COCCI    Culture   Final    ABUNDANT GROUP A STREP (S.PYOGENES) ISOLATED Beta hemolytic streptococci are predictably susceptible to penicillin  and other beta lactams. Susceptibility testing not routinely performed. NO ANAEROBES ISOLATED Performed at [facility], [address] [place], [place] [address]    Report Status 04/07/2022 FINAL  Final  Fungus Culture Result     Status: None   Collection Time: 04/02/22  1:30 PM  Result Value Ref Range Status   Result 1 Comment  Final    Comment: (NOTE) KOH/Calcofluor preparation:  no fungus observed. Performed At: [facility] [address] [place], [place] [id number] Jennette Shorter Munoz [phone]   Fungal organism reflex     Status: None   Collection Time: 04/02/22  1:30 PM  Result Value Ref Range Status   Fungal result 1 Comment  Final    Comment: (NOTE) No yeast or mold isolated after 4 weeks. Performed At: [facility] [address] [place], [place] [id number] Jennette Shorter Munoz [phone]   Fungus Culture With Stain     Status: None   Collection Time: 04/02/22  1:32 PM   Specimen:  PATH Other; Tissue  Result Value Ref Range Status   Fungus Stain Final report  Final   Fungus (Mycology) Culture Final report  Final    Comment: (NOTE) Performed At: [facility] [address] [place], [place] [id number] Jennette Shorter Munoz [phone]    Fungal Source WOUND  Final    Comment: Performed at [facility], [address], [place], [place] [address]  Aerobic/Anaerobic Culture w Gram Stain (surgical/deep wound)     Status: None   Collection Time: 04/02/22  1:32 PM   Specimen: PATH Other; Tissue  Result Value Ref Range Status   Specimen Description WOUND  Final   Special Requests LEFT LEG DISTAL MEDIAL DEEP TEST ANCEF   Final   Gram Stain   Final    NO WBC SEEN ABUNDANT GRAM POSITIVE COCCI IN PAIRS IN CHAINS    Culture  Final    ABUNDANT GROUP A STREP (S.PYOGENES) ISOLATED Beta hemolytic streptococci are predictably susceptible to penicillin  and other beta lactams. Susceptibility testing not routinely performed. NO ANAEROBES ISOLATED Performed at [facility], [address] [place], [place] [address]    Report Status 04/07/2022 FINAL  Final  Fungus Culture Result     Status: None   Collection Time: 04/02/22  1:32 PM  Result Value Ref Range Status   Result 1 Comment  Final    Comment: (NOTE) KOH/Calcofluor preparation:  no fungus observed. Performed At: [facility] [address] [place], [place] [id number] Jennette Shorter Munoz Ey:[phone]   Fungal organism reflex     Status: None   Collection Time: 04/02/22  1:32 PM  Result Value Ref Range Status   Fungal result 1 Comment  Final    Comment: (NOTE) No yeast or mold isolated after 4 weeks. Performed At: [facility] [address] [place], [place] [id number] Jennette Shorter Munoz [phone]   Culture, blood (Routine X 2) w Reflex to ID Panel     Status: None   Collection Time: 04/04/22  5:41 AM   Specimen: BLOOD  Result Value Ref Range Status   Specimen Description  BLOOD LEFT ANTECUBITAL  Final   Special Requests   Final    BOTTLES DRAWN AEROBIC AND ANAEROBIC Blood Culture adequate volume   Culture   Final    NO GROWTH 5 DAYS Performed at [facility], [address] [place], [place] [address]    Report Status 04/09/2022 FINAL  Final  Culture, blood (Routine X 2) w Reflex to ID Panel     Status: None   Collection Time: 04/04/22  5:49 AM   Specimen: BLOOD LEFT HAND  Result Value Ref Range Status   Specimen Description BLOOD LEFT HAND  Final   Special Requests   Final    BOTTLES DRAWN AEROBIC AND ANAEROBIC Blood Culture adequate volume   Culture   Final    NO GROWTH 5 DAYS Performed at [facility], [address], [place], [place] [address]    Report Status 04/09/2022 FINAL  Final  Aerobic/Anaerobic Culture w Gram Stain (surgical/deep wound)     Status: None   Collection Time: 04/04/22  2:18 PM   Specimen: PATH Other; Tissue  Result Value Ref Range Status   Specimen Description TISSUE  Final   Special Requests LEFT LEG NECTORIZING FASCITIS  Final   Gram Stain   Final    NO WBC SEEN MODERATE GRAM POSITIVE COCCI IN PAIRS FEW GRAM POSITIVE COCCI IN CHAINS    Culture   Final    ABUNDANT GROUP A STREP (S.PYOGENES) ISOLATED Beta hemolytic streptococci are predictably susceptible to penicillin  and other beta lactams. Susceptibility testing not routinely performed. NO ANAEROBES ISOLATED Performed at [facility] Lab, [address] [place], [place] [address]    Report Status 04/09/2022 FINAL  Final  Aerobic/Anaerobic Culture w Gram Stain (surgical/deep wound)     Status: None   Collection Time: 04/04/22  2:33 PM   Specimen: PATH Soft tissue  Result Value Ref Range Status   Specimen Description TISSUE  Final   Special Requests LEFT THEIGH  Final   Gram Stain NO WBC SEEN NO ORGANISMS SEEN   Final   Culture   Final    RARE GROUP A STREP (S.PYOGENES) ISOLATED Beta hemolytic streptococci are predictably susceptible to  penicillin  and other beta lactams. Susceptibility testing not routinely performed. NO  ANAEROBES ISOLATED Performed at [facility], [address], [place], [place] [address]    Report Status 04/09/2022 FINAL  Final    Imaging reviewed:  DG Foot Complete Right Result Date: 06/19/2024 CLINICAL DATA:  Fifth toe ulcer EXAM: RIGHT FOOT COMPLETE - 3+ VIEW COMPARISON:  Radiographs 05/27/2024 FINDINGS: Progressive bony demineralization with cortical destruction in the head of the fifth metatarsal, highly suspicious for active osteomyelitis. Possible involvement of the base of the proximal phalanx small toe. Generalized bony demineralization in the foot noted. Atheromatous vascular calcifications. Small plantar and Achilles calcaneal spurs. Suspected infiltrative subcutaneous edema in the distal calf. Dorsal infiltrative edema in the forefoot. IMPRESSION: 1. New bony demineralization with cortical destruction in the head of the fifth metatarsal, highly suspicious for active osteomyelitis given the clinical context. Possible involvement of the base of the proximal phalanx small toe. 2. Generalized bony demineralization. 3. Atheromatous vascular calcifications. 4. Suspected infiltrative subcutaneous edema in the distal calf and dorsal forefoot. Electronically Signed   By: Ryan Salvage M.D.   On: 06/19/2024 18:42    ED Course:  XR concerning for OM, treated with Vancomycin , Unasyn      Assessment/Plan:  73 y.o. female with hx DM type 2, PAD, L AKA, HFmrEF, HTN, IDA, recent ED visit with concern for DM foot ulcer and associated cellulitis seen on 8/13 with no evidence of osteomyelitis at that time and was discharged on course of Augmentin , reportedly referred to Podiatry although had not seen, Rx'd course of Bactrim and Doxycycline  since 8/26 by PCP. Presented due to worsening pain in her R foot wound. Found to have osteomyelitis involving R 5th MT and possible proximal phalanx.   Osteomyelitis R  5th MT and possible proximal phalanx  Diabetic foot infection, moderate   P/w worsening wound ~3 weeks. recent ED visit 8/13 with DM ulcer/cellulitis with no evidence of osteomyelitis at that time and was discharged on course of Augmentin .  PCP then rx'd Bactrim and doxycycline  on 8/26.  Afebrile, no SIRS. WBC 6. XR of R foot c/f osteomyelitis as described above  -- Routine orthopedic surgery consult in a.m., not contacted overnight. -- Continue Vancomycin  pharm to dose, switched Unasyn  to Zosyn  3375 mg IV every 8 hours has been soaking foot in water -- Check MRI of the R toes with and without contrast  -- ABI RLE with hx PAD  -- OK for carb controlled diet pending additional surgical planning tomorrow.  -- Symptomatic mgmt: reduce her Tramadol  in setting of borderline renal fxn to 50 mg q 12 hr; if has uncontrolled symptom would switch to alternate agent.   DM type 2, with hyperglycemia  Home regimen Jardiance .  -- Check A1c  -- Start gentle Semglee  6 units nightly, and add SSI for very sensitive  -- Would stop Jardiance  at discharge due to DM foot infection   Suspect mild exacerbation chronic HFmrEF:  No significant symptoms,. However on exam with 2+ edema in the RLE. Last echo in 4/'25 with 40-45%, G1DD, mild reduced RV systolic function. Small pericardial effusion. Mild MR.  -- give Lasix  20 mg IV x 1, reassess need for ongoing IV diuresis.  -- After initial dose, tentatively will resume on home 20 mg PO daily.  -- And continue home spironolactone  25 mg daily,  -- Check BNP   Chronic medical problems:  PAD, hx L AKA: Noted CKD stage II: Baseline Cr 0.8 - 0.9 -> up to 1.2 on admission.  HTN: Hold home losartan  with uptrend in  Cr;  HLD: Resume home Rosuvastatin  20 mg nightly, check lipids. May need reduction based on renal fxn trend.  IDA: Hb slightly down from B/l 10 -> 9, continue to monitor  GERD: sub home PPI   Body mass index is 19.84 kg/m. Mild protein calorie malnutrition     DVT prophylaxis:  SQ Heparin  Code Status:  Full Code Diet:  Diet Orders (From admission, onward)     Start     Ordered   06/20/24 0024  Diet Carb Modified Fluid consistency: Thin; Room service appropriate? Yes  Diet effective now       Question Answer Comment  Diet-HS Snack? Nothing   Calorie Level Medium 1600-2000   Fluid consistency: Thin   Room service appropriate? Yes      06/20/24 0026           Family Communication:  Yes discussed with husband at bedside Consults:  None   Admission status:   Inpatient, Med-Surg  Severity of Illness: The appropriate patient status for this patient is INPATIENT. Inpatient status is judged to be reasonable and necessary in order to provide the required intensity of service to ensure the patient's safety. The patient's presenting symptoms, physical exam findings, and initial radiographic and laboratory data in the context of their chronic comorbidities is felt to place them at high risk for further clinical deterioration. Furthermore, it is not anticipated that the patient will be medically stable for discharge from the hospital within 2 midnights of admission.   * I certify that at the point of admission it is my clinical judgment that the patient will require inpatient hospital care spanning beyond 2 midnights from the point of admission due to high intensity of service, high risk for further deterioration and high frequency of surveillance required.*   Dorn Dawson, Munoz [facility]  How to contact the [facility] Attending or Consulting provider 7A - 7P or covering provider during after hours 7P -7A, for this patient.  Check the care team in [facility] and look for a) attending/consulting [facility] provider listed and b) the [facility] team listed Log into [email or web address] and use [facility]'s universal password to access. If you Munoz not have the password, please contact the hospital operator. Locate the [facility] provider you are looking for under [facility] and page to a number that you can be directly reached. If you still have difficulty reaching the provider, please page the [facility] (Director on Call) for the Hospitalists listed on amion for assistance.  06/20/2024, 12:26 AM

## 2024-06-20 NOTE — Progress Notes (Signed)
 Pharmacy Antibiotic Note  Jill Munoz is a 73 y.o. female admitted on 06/19/2024 with DM foot ulcer wound infection.  Pharmacy has been consulted for vancomycin  dosing.  No loading dose received yet. 9/6 Vancomycin  1250mg  Q 48 hr with Est AUC: 467 Scr used: 1.2 mg/dL; Vd coeff: 0.72 L/kg  Plan: Vancomycin  1250mg  q48hr Monitor cultures, clinical status, renal function, vancomycin  level Narrow abx as able and f/u duration    Height: 5' 1 (154.9 cm) Weight: 47.6 kg (105 lb) IBW/kg (Calculated) : 47.8  Temp (24hrs), Avg:98 F (36.7 C), Min:97.9 F (36.6 C), Max:98 F (36.7 C)  Recent Labs  Lab 06/19/24 1739 06/19/24 1753 06/19/24 2224  WBC 6.1  --   --   CREATININE 1.23* 1.20*  --   LATICACIDVEN  --  1.0 1.0    Estimated Creatinine Clearance: 32.3 mL/min (A) (by C-G formula based on SCr of 1.2 mg/dL (H)).    Allergies  Allergen Reactions   Other     States she is allergic to abx but does not know name    Amoxicillin  Diarrhea    Antimicrobials this admission: Vanc  9/6 >>  ampsulb 9/5 >>   Dose adjustments this admission: N/a  Microbiology results: 9/5 BCx:    Thank you for allowing pharmacy to be a part of this patient's care.  Jinnie Door, PharmD, BCPS, BCCP Clinical Pharmacist  Please check AMION for all [facility] Pharmacy phone numbers After 10:00 PM, call Main Pharmacy [phone]

## 2024-06-20 NOTE — Consult Note (Signed)
 ORTHOPAEDIC CONSULTATION  REQUESTING PHYSICIAN: Jonel Lonni SQUIBB, *  Chief Complaint: Ulcer fifth metatarsal head right foot.  HPI: Jill Munoz is a 73 y.o. female who presents with ulceration fifth metatarsal head right foot.  Patient is status post left above-knee amputation.  Past Medical History:  Diagnosis Date   Anemia    Diabetes mellitus without complication (HCC)    Pre-diabetes    Past Surgical History:  Procedure Laterality Date   AMPUTATION  04/06/2022   Procedure: AMPUTATION ABOVE KNEE REVISION;  Surgeon: Harden Jerona GAILS, MD;  Location: [facility] OR;  Service: Orthopedics;;   I & D EXTREMITY Left 04/02/2022   Procedure: IRRIGATION AND DEBRIDEMENT LEFT LOWER LEG APPLICATION OF WOUND VAC ;  Surgeon: Doll Skates, MD;  Location: [facility] OR;  Service: Orthopedics;  Laterality: Left;   I & D EXTREMITY Left 04/04/2022   Procedure: LEFT ABOVE KNEE AMPUTATION;  Surgeon: Harden Jerona GAILS, MD;  Location: [facility] OR;  Service: Orthopedics;  Laterality: Left;   I & D EXTREMITY Left 04/06/2022   Procedure: LEFT LEG DEBRIDEMENT;  Surgeon: Harden Jerona GAILS, MD;  Location: [facility] OR;  Service: Orthopedics;  Laterality: Left;   STUMP REVISION Left 04/11/2022   Procedure: REVISION LEFT ABOVE KNEE AMPUTATION;  Surgeon: Harden Jerona GAILS, MD;  Location: [facility] OR;  Service: Orthopedics;  Laterality: Left;   Social History   Socioeconomic History   Marital status: Married    Spouse name: Not on file   Number of children: Not on file   Years of education: Not on file   Highest education level: Not on file  Occupational History   Not on file  Tobacco Use   Smoking status: Never   Smokeless tobacco: Never  Vaping Use   Vaping status: Never Used  Substance and Sexual Activity   Alcohol  use: Never   Drug use: Never   Sexual activity: Not on file  Other Topics Concern   Not on file  Social History Narrative   Not on file   Social Drivers of Health   Financial Resource Strain: Low Risk   (10/14/2023)   Overall Financial Resource Strain (CARDIA)    Difficulty of Paying Living Expenses: Not hard at all  Food Insecurity: No Food Insecurity (06/20/2024)   Hunger Vital Sign    Worried About Running Out of Food in the Last Year: Never true    Ran Out of Food in the Last Year: Never true  Transportation Needs: No Transportation Needs (06/20/2024)   PRAPARE - Administrator, Civil Service (Medical): No    Lack of Transportation (Non-Medical): No  Physical Activity: Inactive (10/14/2023)   Exercise Vital Sign    Days of Exercise per Week: 0 days    Minutes of Exercise per Session: 0 min  Stress: No Stress Concern Present (10/14/2023)   [employer] of Occupational Health - Occupational Stress Questionnaire    Feeling of Stress : Not at all  Social Connections: Moderately Isolated (06/20/2024)   Social Connection and Isolation Panel    Frequency of Communication with Friends and Family: Three times a week    Frequency of Social Gatherings with Friends and Family: Once a week    Attends Religious Services: Never    Database administrator or Organizations: No    Attends Banker Meetings: Never    Marital Status: Married   Family History  Problem Relation Age of Onset   Heart attack Mother    Heart disease  Mother    - negative except otherwise stated in the family history section Allergies  Allergen Reactions   Other     States she is allergic to abx but does not know name    Amoxicillin  Diarrhea   Prior to Admission medications   Medication Sig Start Date End Date Taking? Authorizing Provider  Ascorbic Acid  (VITAMIN C) 1000 MG tablet Take 1,000 mg by mouth daily.   Yes [provider]  BACTRIM DS 800-160 MG tablet Take 1 tablet by mouth 2 (two) times daily. 06/09/24  Yes [provider]  Berberine Chloride 500 MG CAPS Take 1 capsule by mouth in the morning, at noon, and at bedtime.   Yes [provider]   dorzolamide -timolol  (COSOPT ) 22.3-6.8 MG/ML ophthalmic solution Place 1 drop into both eyes 2 (two) times daily. 04/14/22  Yes Elnora Ip, MD  doxycycline  (DORYX ) 100 MG EC tablet Take 100 mg by mouth 2 (two) times daily. 06/17/24  Yes [provider]  furosemide  (LASIX ) 20 MG tablet Take Lasix  20 mg daily. You may take an addition Lasix  20 mg daily (for a total of 40 mg) as needed for swelling. 06/01/24  Yes Tolia, Sunit, DO  JARDIANCE  10 MG TABS tablet Take 10 mg by mouth daily. 05/25/24  Yes [provider]  losartan  (COZAAR ) 50 MG tablet Take 50 mg by mouth daily. 05/12/24  Yes [provider]  melatonin 3 MG TABS tablet Take 3 mg by mouth at bedtime.   Yes [provider]  omeprazole (PRILOSEC) 40 MG capsule Take 60 mg by mouth in the morning and at bedtime. 04/06/24  Yes [provider]  pantoprazole  (PROTONIX ) 40 MG tablet Take 1 tablet (40 mg total) by mouth daily. 08/21/22  Yes Ozell Heron HERO, MD  promethazine  (PHENERGAN ) 12.5 MG tablet Take 12.5 mg by mouth every 6 (six) hours as needed for vomiting. 05/22/24  Yes [provider]  rosuvastatin  (CRESTOR ) 20 MG tablet Take 1 tablet (20 mg total) by mouth at bedtime. 03/02/24 06/19/24 Yes Monge, Damien BROCKS, NP  spironolactone  (ALDACTONE ) 25 MG tablet Take 1 tablet (25 mg total) by mouth daily. 03/02/24 06/19/24 Yes Monge, Damien BROCKS, NP  traMADol  (ULTRAM ) 50 MG tablet Take 50 mg by mouth every 6 (six) hours as needed for moderate pain (pain score 4-6). 05/18/24  Yes [provider]  glucose blood (ACCU-CHEK GUIDE) test strip Use as instructed once a day 09/18/22   Ozell Heron HERO, MD  valACYclovir  (VALTREX ) 1000 MG tablet Take 1 tablet (1,000 mg total) by mouth 3 (three) times daily. Patient not taking: Reported on 06/19/2024 05/27/24   Nivia Colon, PA-C   MR TOES RIGHT W WO CONTRAST Result Date: 06/20/2024 CLINICAL DATA:  Fifth toe ulcer. EXAM: MRI OF THE RIGHT TOES WITHOUT AND WITH  CONTRAST TECHNIQUE: Multiplanar, multisequence MR imaging of the fifth toe was performed both before and after administration of intravenous contrast. CONTRAST:  5mL GADAVIST  GADOBUTROL  1 MMOL/ML IV SOLN COMPARISON:  None Available. FINDINGS: Bones/Joint/Cartilage Soft tissue wound along the plantar aspect of the fifth MTP joint. Bone marrow edema in the fifth metatarsal head and distal diaphysis as well as the base of the fifth proximal phalanx consistent with osteomyelitis. Normal alignment. No joint effusion. Mild osteoarthritis of the first MTP joint. Ligaments Collateral ligaments are intact.  Lisfranc ligament is intact. Muscles and Tendons Flexor, peroneal and extensor compartment tendons are intact. T2 hyperintense signal throughout the plantar musculature likely neurogenic. Soft tissue 1 x 1  x 1.6 cm fluid collection along the plantar lateral aspect of the fifth MTP joint concerning for a small abscess.No soft tissue mass. Soft tissue edema along the dorsal aspect of the forefoot concerning for cellulitis. IMPRESSION: 1. Soft tissue wound along the plantar aspect of the fifth MTP joint. Bone marrow edema in the fifth metatarsal head and distal diaphysis as well as the base of the fifth proximal phalanx consistent with osteomyelitis. 2. A 1 x 1 x 1.6 cm fluid collection along the plantar lateral aspect of the fifth MTP joint concerning for a small abscess. Electronically Signed   By: Julaine Blanch M.D.   On: 06/20/2024 08:38   DG Foot Complete Right Result Date: 06/19/2024 CLINICAL DATA:  Fifth toe ulcer EXAM: RIGHT FOOT COMPLETE - 3+ VIEW COMPARISON:  Radiographs 05/27/2024 FINDINGS: Progressive bony demineralization with cortical destruction in the head of the fifth metatarsal, highly suspicious for active osteomyelitis. Possible involvement of the base of the proximal phalanx small toe. Generalized bony demineralization in the foot noted. Atheromatous vascular calcifications. Small plantar and Achilles  calcaneal spurs. Suspected infiltrative subcutaneous edema in the distal calf. Dorsal infiltrative edema in the forefoot. IMPRESSION: 1. New bony demineralization with cortical destruction in the head of the fifth metatarsal, highly suspicious for active osteomyelitis given the clinical context. Possible involvement of the base of the proximal phalanx small toe. 2. Generalized bony demineralization. 3. Atheromatous vascular calcifications. 4. Suspected infiltrative subcutaneous edema in the distal calf and dorsal forefoot. Electronically Signed   By: Ryan Salvage M.D.   On: 06/19/2024 18:42   - pertinent xrays, CT, MRI studies were reviewed and independently interpreted  Positive ROS: All other systems have been reviewed and were otherwise negative with the exception of those mentioned in the HPI and as above.  Physical Exam: General: Alert, no acute distress Psychiatric: Patient is competent for consent with normal mood and affect Lymphatic: No axillary or cervical lymphadenopathy Cardiovascular: No pedal edema Respiratory: No cyanosis, no use of accessory musculature GI: No organomegaly, abdomen is soft and non-tender    Images:  @ENCIMAGES @  Labs:  Lab Results  Component Value Date   HGBA1C 8.4 (H) 06/20/2024   HGBA1C 7.2 (H) 02/09/2024   HGBA1C 7.7 (A) 08/21/2022   ESRSEDRATE 94 (H) 06/20/2024   CRP 1.7 (H) 06/20/2024   REPTSTATUS PENDING 06/19/2024   GRAMSTAIN NO WBC SEEN NO ORGANISMS SEEN  04/04/2022   CULT  06/19/2024    NO GROWTH < 12 HOURS Performed at [facility], [address] [place], [place] [address]    [facility] STAPHYLOCOCCUS LUGDUNENSIS 04/02/2022    Lab Results  Component Value Date   ALBUMIN  3.3 (L) 06/19/2024   ALBUMIN  3.6 05/09/2024   ALBUMIN  3.3 (L) 02/09/2024        Latest Ref Rng & Units 06/20/2024    6:33 AM 06/19/2024    5:53 PM 06/19/2024    5:39 PM  CBC EXTENDED  WBC 4.0 - 10.5 K/uL 7.8   6.1   RBC 3.87 - 5.11 MIL/uL 2.89    3.23   Hemoglobin 12.0 - 15.0 g/dL 8.0  9.9  9.2   HCT 63.9 - 46.0 % 24.8  29.0  28.1   Platelets 150 - 400 K/uL 376   426   NEUT# 1.7 - 7.7 K/uL   4.3   Lymph# 0.7 - 4.0 K/uL   1.3     Neurologic: Patient does not have protective sensation bilateral lower extremities.   MUSCULOSKELETAL:  Skin: Examination patient has a 1 cm ulcer beneath the fifth metatarsal head right foot.  There is no ascending cellulitis she has good wrinkling of the skin.  I cannot palpate a dorsalis pedis or posterior tibial pulse.  Review of the MRI scan shows osteomyelitis of the MTP joint right little toe.  There is no abscess.  White cell count 7.8 with a hemoglobin of 8.0.  Most recent hemoglobin A1c 8.4.  Assessment: Assessment: Osteomyelitis right foot fifth MTP joint without surrounding cellulitis or purulent drainage.  Plan: Plan: Would obtain the ABI of the right lower extremity at this time.  Patient may discharge on doxycycline  100 mg twice a day and I will follow-up in the office in 1 week.  Patient is not interested in surgical intervention at this time.  Thank you for the consult and the opportunity to see Ms. Wineland  Jerona Sage, MD [facility] [phone] 10:18 AM

## 2024-06-20 NOTE — Plan of Care (Signed)
  Problem: Coping: Goal: Ability to adjust to condition or change in health will improve Outcome: Progressing   Problem: Fluid Volume: Goal: Ability to maintain a balanced intake and output will improve Outcome: Progressing   Problem: Health Behavior/Discharge Planning: Goal: Ability to identify and utilize available resources and services will improve Outcome: Progressing   Problem: Nutritional: Goal: Maintenance of adequate nutrition will improve Outcome: Progressing

## 2024-06-20 NOTE — Progress Notes (Signed)
 New Admission Note:  Arrival Method: By bed from ED around 0145 Mental Orientation: Alert and oriented x 4 Telemetry: None Assessment: Completed Skin: Completed, refer to flowsheets IV: R hand, infiltrated Pain: Denies Tubes: None Safety Measures: Safety Fall Prevention Plan was given, discussed and signed. Admission: Completed 5 [place] Orientation: Patient has been orientated to the room, unit and the staff. Family: Husband at bedside  Orders have been reviewed and implemented. Will continue to monitor the patient. Call light has been placed within reach and bed alarm has been activated.   Bari Lor, RN  Phone Number: [phone]

## 2024-06-20 NOTE — Progress Notes (Signed)
 PHARMACY NOTE:  ANTIMICROBIAL RENAL DOSAGE ADJUSTMENT  Current antimicrobial regimen includes a mismatch between antimicrobial dosage and estimated renal function.  As per policy approved by the Pharmacy & Therapeutics and Medical Executive Committees, the antimicrobial dosage will be adjusted accordingly.  Current antimicrobial dosage:  amp/sulb 3g q6h  Indication: wound infxn   Renal Function:  Estimated Creatinine Clearance: 32.3 mL/min (A) (by C-G formula based on SCr of 1.2 mg/dL (H)).     Antimicrobial dosage has been changed to:  3g q8hr  Additional comments: noted weight, borderline clcr   Thank you for allowing pharmacy to be a part of this patient's care.  Jinnie Door, PharmD, BCPS, BCCP Clinical Pharmacist  Please check AMION for all [facility] Pharmacy phone numbers After 10:00 PM, call Main Pharmacy [phone]

## 2024-06-21 DIAGNOSIS — Z89612 Acquired absence of left leg above knee: Secondary | ICD-10-CM

## 2024-06-21 DIAGNOSIS — M86171 Other acute osteomyelitis, right ankle and foot: Secondary | ICD-10-CM | POA: Diagnosis not present

## 2024-06-21 DIAGNOSIS — E1159 Type 2 diabetes mellitus with other circulatory complications: Secondary | ICD-10-CM | POA: Diagnosis not present

## 2024-06-21 DIAGNOSIS — M869 Osteomyelitis, unspecified: Secondary | ICD-10-CM | POA: Diagnosis not present

## 2024-06-21 DIAGNOSIS — I739 Peripheral vascular disease, unspecified: Secondary | ICD-10-CM | POA: Diagnosis not present

## 2024-06-21 LAB — CBC
HCT: 23.8 % — ABNORMAL LOW (ref 36.0–46.0)
Hemoglobin: 7.8 g/dL — ABNORMAL LOW (ref 12.0–15.0)
MCH: 27.9 pg (ref 26.0–34.0)
MCHC: 32.8 g/dL (ref 30.0–36.0)
MCV: 85 fL (ref 80.0–100.0)
Platelets: 370 K/uL (ref 150–400)
RBC: 2.8 MIL/uL — ABNORMAL LOW (ref 3.87–5.11)
RDW: 14.6 % (ref 11.5–15.5)
WBC: 8.1 K/uL (ref 4.0–10.5)
nRBC: 0 % (ref 0.0–0.2)

## 2024-06-21 LAB — BASIC METABOLIC PANEL WITH GFR
Anion gap: 11 (ref 5–15)
BUN: 24 mg/dL — ABNORMAL HIGH (ref 8–23)
CO2: 24 mmol/L (ref 22–32)
Calcium: 9 mg/dL (ref 8.9–10.3)
Chloride: 99 mmol/L (ref 98–111)
Creatinine, Ser: 1.38 mg/dL — ABNORMAL HIGH (ref 0.44–1.00)
GFR, Estimated: 41 mL/min — ABNORMAL LOW (ref >60)
Glucose, Bld: 87 mg/dL (ref 70–99)
Potassium: 3.7 mmol/L (ref 3.5–5.1)
Sodium: 134 mmol/L — ABNORMAL LOW (ref 135–145)

## 2024-06-21 LAB — GLUCOSE, CAPILLARY
Glucose-Capillary: 78 mg/dL (ref 70–99)
Glucose-Capillary: 157 mg/dL — ABNORMAL HIGH (ref 70–99)
Glucose-Capillary: 239 mg/dL — ABNORMAL HIGH (ref 70–99)
Glucose-Capillary: 135 mg/dL — ABNORMAL HIGH (ref 70–99)

## 2024-06-21 LAB — VAS US ABI WITH/WO TBI: Right ABI: 1.16

## 2024-06-21 MED ORDER — DIPHENHYDRAMINE HCL 25 MG PO CAPS
25.0000 mg | ORAL_CAPSULE | Freq: Once | ORAL | Status: AC | PRN
Start: 2024-06-21 — End: 2024-06-21
  Administered 2024-06-21: 25 mg via ORAL
  Filled 2024-06-21: qty 1

## 2024-06-21 NOTE — Consult Note (Signed)
 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 Lonni Dalton, MD  Jill Munoz is an 73 y.o. female.  HPI: 73 y.o. female with hx of DM type 2, PAD, L AKA, HFmrEF, HTN, IDA, recent ED visit with concern for DM foot ulcer and associated cellulitis seen on 8/13 with no evidence of osteomyelitis at that time and was discharged on course of Augmentin , reportedly referred to Podiatry although had not seen, Rx'd course of Bactrim and Doxycycline  since 8/26 by PCP. Presented due to worsening pain in her R foot wound.  Podiatry was consulted for second opinion and for possible bone biopsy.  Was seen by Dr. Harden and recommended amputation but the patient is not agreeable to at this time.  Patient states that since starting the IV antibiotics she has been feeling better.  She has no pain.  Past Medical History:  Diagnosis Date   Anemia    Diabetes mellitus without complication (HCC)    Pre-diabetes     Past Surgical History:  Procedure Laterality Date   AMPUTATION  04/06/2022   Procedure: AMPUTATION ABOVE KNEE REVISION;  Surgeon: Harden Jerona GAILS, MD;  Location: [facility] OR;  Service: Orthopedics;;   I & D EXTREMITY Left 04/02/2022   Procedure: IRRIGATION AND DEBRIDEMENT LEFT LOWER LEG APPLICATION OF WOUND VAC ;  Surgeon: Doll Skates, MD;  Location: [facility] OR;  Service: Orthopedics;  Laterality: Left;   I & D EXTREMITY Left 04/04/2022   Procedure: LEFT ABOVE KNEE AMPUTATION;  Surgeon: Harden Jerona GAILS, MD;  Location: [facility] OR;  Service: Orthopedics;  Laterality: Left;   I & D EXTREMITY Left 04/06/2022   Procedure: LEFT LEG DEBRIDEMENT;  Surgeon: Harden Jerona GAILS, MD;  Location: [facility] OR;  Service: Orthopedics;  Laterality: Left;   STUMP REVISION Left 04/11/2022   Procedure: REVISION LEFT ABOVE KNEE AMPUTATION;  Surgeon: Harden Jerona GAILS, MD;  Location: [facility] OR;  Service: Orthopedics;  Laterality: Left;    Family History  Problem Relation Age of Onset   Heart attack Mother    Heart  disease Mother     Social History:  reports that she has never smoked. She has never used smokeless tobacco. She reports that she does not drink alcohol  and does not use drugs.  Allergies:  Allergies  Allergen Reactions   Other     States she is allergic to abx but does not know name    Amoxicillin  Diarrhea    Medications: I have reviewed the patient's current medications.  Results for orders placed or performed during the hospital encounter of 06/19/24 (from the past 48 hours)  CBC with Differential     Status: Abnormal   Collection Time: 06/19/24  5:39 PM  Result Value Ref Range   WBC 6.1 4.0 - 10.5 K/uL   RBC 3.23 (L) 3.87 - 5.11 MIL/uL   Hemoglobin 9.2 (L) 12.0 - 15.0 g/dL   HCT 71.8 (L) 63.9 - 53.9 %   MCV 87.0 80.0 - 100.0 fL   MCH 28.5 26.0 - 34.0 pg   MCHC 32.7 30.0 - 36.0 g/dL   RDW 85.3 88.4 - 84.4 %   Platelets 426 (H) 150 - 400 K/uL   nRBC 0.0 0.0 - 0.2 %   Neutrophils Relative % 72 %   Neutro Abs 4.3 1.7 - 7.7 K/uL   Lymphocytes Relative 21 %   Lymphs Abs 1.3 0.7 - 4.0 K/uL   Monocytes Relative 5 %   Monocytes Absolute 0.3 0.1 - 1.0 K/uL  Eosinophils Relative 2 %   Eosinophils Absolute 0.1 0.0 - 0.5 K/uL   Basophils Relative 0 %   Basophils Absolute 0.0 0.0 - 0.1 K/uL   Immature Granulocytes 0 %   Abs Immature Granulocytes 0.02 0.00 - 0.07 K/uL    Comment: Performed at [facility], [address] [place], [place] [address]  Comprehensive metabolic panel     Status: Abnormal   Collection Time: 06/19/24  5:39 PM  Result Value Ref Range   Sodium 133 (L) 135 - 145 mmol/L   Potassium 4.4 3.5 - 5.1 mmol/L   Chloride 98 98 - 111 mmol/L   CO2 24 22 - 32 mmol/L   Glucose, Bld 255 (H) 70 - 99 mg/dL    Comment: Glucose reference range applies only to samples taken after fasting for at least 8 hours.   BUN 20 8 - 23 mg/dL   Creatinine, Ser 8.76 (H) 0.44 - 1.00 mg/dL   Calcium  9.4 8.9 - 10.3 mg/dL   Total Protein 7.9 6.5 - 8.1 g/dL   Albumin  3.3 (L) 3.5  - 5.0 g/dL   AST 19 15 - 41 U/L   ALT 19 0 - 44 U/L   Alkaline Phosphatase 132 (H) 38 - 126 U/L   Total Bilirubin 0.5 0.0 - 1.2 mg/dL   GFR, Estimated 47 (L) >60 mL/min    Comment: (NOTE) Calculated using the CKD-EPI Creatinine Equation (2021)    Anion gap 11 5 - 15    Comment: Performed at [facility], [address], [place], [place] [address]  Blood culture (routine x 2)     Status: None (Preliminary result)   Collection Time: 06/19/24  5:39 PM   Specimen: BLOOD RIGHT HAND  Result Value Ref Range   Specimen Description BLOOD RIGHT HAND    Special Requests      BOTTLES DRAWN AEROBIC AND ANAEROBIC Blood Culture results may not be optimal due to an inadequate volume of blood received in culture bottles   Culture      NO GROWTH < 24 HOURS Performed at [facility], [address] [place], [place] [address]    Report Status PENDING   I-Stat Lactic Acid     Status: None   Collection Time: 06/19/24  5:53 PM  Result Value Ref Range   Lactic Acid, Venous 1.0 0.5 - 1.9 mmol/L  I-stat chem 8, ED     Status: Abnormal   Collection Time: 06/19/24  5:53 PM  Result Value Ref Range   Sodium 134 (L) 135 - 145 mmol/L   Potassium 4.5 3.5 - 5.1 mmol/L   Chloride 100 98 - 111 mmol/L   BUN 22 8 - 23 mg/dL   Creatinine, Ser 8.79 (H) 0.44 - 1.00 mg/dL   Glucose, Bld 746 (H) 70 - 99 mg/dL    Comment: Glucose reference range applies only to samples taken after fasting for at least 8 hours.   Calcium , Ion 1.23 1.15 - 1.40 mmol/L   TCO2 23 22 - 32 mmol/L   Hemoglobin 9.9 (L) 12.0 - 15.0 g/dL   HCT 70.9 (L) 63.9 - 53.9 %  Blood culture (routine x 2)     Status: None (Preliminary result)   Collection Time: 06/19/24 10:24 PM   Specimen: BLOOD  Result Value Ref Range   Specimen Description BLOOD SITE NOT SPECIFIED    Special Requests      BOTTLES DRAWN AEROBIC AND ANAEROBIC Blood Culture adequate volume  Culture      NO GROWTH < 12 HOURS Performed at [facility], [address], [place], [place] [address]    Report Status PENDING   I-Stat Lactic Acid     Status: None   Collection Time: 06/19/24 10:24 PM  Result Value Ref Range   Lactic Acid, Venous 1.0 0.5 - 1.9 mmol/L  Glucose, capillary     Status: Abnormal   Collection Time: 06/20/24  1:53 AM  Result Value Ref Range   Glucose-Capillary 205 (H) 70 - 99 mg/dL    Comment: Glucose reference range applies only to samples taken after fasting for at least 8 hours.  Glucose, capillary     Status: Abnormal   Collection Time: 06/20/24  6:28 AM  Result Value Ref Range   Glucose-Capillary 201 (H) 70 - 99 mg/dL    Comment: Glucose reference range applies only to samples taken after fasting for at least 8 hours.  Basic metabolic panel with GFR     Status: Abnormal   Collection Time: 06/20/24  6:33 AM  Result Value Ref Range   Sodium 134 (L) 135 - 145 mmol/L   Potassium 4.1 3.5 - 5.1 mmol/L   Chloride 100 98 - 111 mmol/L   CO2 22 22 - 32 mmol/L   Glucose, Bld 216 (H) 70 - 99 mg/dL    Comment: Glucose reference range applies only to samples taken after fasting for at least 8 hours.   BUN 20 8 - 23 mg/dL   Creatinine, Ser 8.94 (H) 0.44 - 1.00 mg/dL   Calcium  8.9 8.9 - 10.3 mg/dL   GFR, Estimated 57 (L) >60 mL/min    Comment: (NOTE) Calculated using the CKD-EPI Creatinine Equation (2021)    Anion gap 12 5 - 15    Comment: Performed at [facility], [address], [place], [place] [address]  CBC     Status: Abnormal   Collection Time: 06/20/24  6:33 AM  Result Value Ref Range   WBC 7.8 4.0 - 10.5 K/uL   RBC 2.89 (L) 3.87 - 5.11 MIL/uL   Hemoglobin 8.0 (L) 12.0 - 15.0 g/dL   HCT 75.1 (L) 63.9 - 53.9 %   MCV 85.8 80.0 - 100.0 fL   MCH 27.7 26.0 - 34.0 pg   MCHC 32.3 30.0 - 36.0 g/dL   RDW 85.5 88.4 - 84.4 %   Platelets 376 150 - 400 K/uL   nRBC 0.0 0.0 - 0.2 %    Comment: Performed at [facility], [address], [place], [place] [address]  Magnesium      Status: Abnormal   Collection Time:  06/20/24  6:33 AM  Result Value Ref Range   Magnesium  1.6 (L) 1.7 - 2.4 mg/dL    Comment: Performed at [facility], [address] [place], [place] [address]  Phosphorus     Status: None   Collection Time: 06/20/24  6:33 AM  Result Value Ref Range   Phosphorus 2.7 2.5 - 4.6 mg/dL    Comment: Performed at [facility], [address] [place], [place] [address]  C-reactive protein     Status: Abnormal   Collection Time: 06/20/24  6:33 AM  Result Value Ref Range   CRP 1.7 (H) <1.0 mg/dL    Comment: Performed at [facility], [address] [place], [place] [address]  Sedimentation rate     Status: Abnormal   Collection Time: 06/20/24  6:33  AM  Result Value Ref Range   Sed Rate 94 (H) 0 - 22 mm/hr    Comment: Performed at [facility], [address], [place], [place] [address]  Brain natriuretic peptide     Status: Abnormal   Collection Time: 06/20/24  6:33 AM  Result Value Ref Range   B Natriuretic Peptide 125.1 (H) 0.0 - 100.0 pg/mL    Comment: Performed at [facility], [address], [place], [place] [address]  Lipid panel     Status: None   Collection Time: 06/20/24  6:33 AM  Result Value Ref Range   Cholesterol 148 0 - 200 mg/dL   Triglycerides 887 <849 mg/dL   HDL 44 >59 mg/dL   Total CHOL/HDL Ratio 3.4 RATIO   VLDL 22 0 - 40 mg/dL   LDL Cholesterol 82 0 - 99 mg/dL    Comment:        Total Cholesterol/HDL:CHD Risk Coronary Heart Disease Risk Table                     Men   Women  1/2 Average Risk   3.4   3.3  Average Risk       5.0   4.4  2 X Average Risk   9.6   7.1  3 X Average Risk  23.4   11.0        Use the calculated Patient Ratio above and the CHD Risk Table to determine the patient's CHD Risk.        ATP III CLASSIFICATION (LDL):  <100     mg/dL   Optimal  899-870  mg/dL   Near or Above                    Optimal  130-159  mg/dL   Borderline  839-810  mg/dL   High  >809     mg/dL   Very High Performed at [facility], [address], [place], [place] [address]   Hemoglobin A1c     Status: Abnormal   Collection Time: 06/20/24  6:33 AM  Result Value Ref Range   Hgb A1c MFr Bld 8.4 (H) 4.8 - 5.6 %    Comment: (NOTE) Diagnosis of Diabetes The following HbA1c ranges recommended by the American Diabetes Association (ADA) may be used as an aid in the diagnosis of diabetes mellitus.  Hemoglobin             Suggested A1C NGSP%              Diagnosis  <5.7                   Non Diabetic  5.7-6.4                Pre-Diabetic  >6.4                   Diabetic  <7.0                   Glycemic control for                       adults with diabetes.     Mean Plasma Glucose 194.38 mg/dL    Comment: Performed at [facility], [address], [place], [place] [address]  Glucose, capillary     Status: Abnormal   Collection Time: 06/20/24 12:52 PM  Result  Value Ref Range   Glucose-Capillary 119 (H) 70 - 99 mg/dL    Comment: Glucose reference range applies only to samples taken after fasting for at least 8 hours.  Glucose, capillary     Status: Abnormal   Collection Time: 06/20/24  4:39 PM  Result Value Ref Range   Glucose-Capillary 108 (H) 70 - 99 mg/dL    Comment: Glucose reference range applies only to samples taken after fasting for at least 8 hours.  Glucose, capillary     Status: Abnormal   Collection Time: 06/20/24  9:31 PM  Result Value Ref Range   Glucose-Capillary 178 (H) 70 - 99 mg/dL    Comment: Glucose reference range applies only to samples taken after fasting for at least 8 hours.  CBC     Status: Abnormal   Collection Time: 06/21/24  4:34 AM  Result Value Ref Range   WBC 8.1 4.0 - 10.5 K/uL   RBC 2.80 (L) 3.87 - 5.11 MIL/uL   Hemoglobin 7.8 (L) 12.0 - 15.0 g/dL   HCT 76.1 (L) 63.9 - 53.9 %   MCV 85.0 80.0 - 100.0 fL   MCH 27.9 26.0 - 34.0 pg   MCHC 32.8 30.0 - 36.0 g/dL   RDW 85.3 88.4 - 84.4 %   Platelets 370 150 - 400 K/uL   nRBC 0.0 0.0 - 0.2 %    Comment:  Performed at [facility], [address], [place], [place] [address]  Basic metabolic panel with GFR     Status: Abnormal   Collection Time: 06/21/24  4:34 AM  Result Value Ref Range   Sodium 134 (L) 135 - 145 mmol/L   Potassium 3.7 3.5 - 5.1 mmol/L   Chloride 99 98 - 111 mmol/L   CO2 24 22 - 32 mmol/L   Glucose, Bld 87 70 - 99 mg/dL    Comment: Glucose reference range applies only to samples taken after fasting for at least 8 hours.   BUN 24 (H) 8 - 23 mg/dL   Creatinine, Ser 8.61 (H) 0.44 - 1.00 mg/dL   Calcium  9.0 8.9 - 10.3 mg/dL   GFR, Estimated 41 (L) >60 mL/min    Comment: (NOTE) Calculated using the CKD-EPI Creatinine Equation (2021)    Anion gap 11 5 - 15    Comment: Performed at [facility], [address] [place], [place] [address]  Glucose, capillary     Status: None   Collection Time: 06/21/24  7:02 AM  Result Value Ref Range   Glucose-Capillary 78 70 - 99 mg/dL    Comment: Glucose reference range applies only to samples taken after fasting for at least 8 hours.    VAS US  ABI WITH/WO TBI Result Date: 06/20/2024  LOWER EXTREMITY DOPPLER STUDY Patient Name:  Jill Munoz  Date of Exam:   06/20/2024 Medical Rec #: [id number]        Accession #:    [id number] Date of Birth: Dec 01, 1950         Patient Gender: F Patient Age:   75 years Exam Location:  [facility] Procedure:      VAS US  ABI WITH/WO TBI Referring Phys: DORN DAWSON --------------------------------------------------------------------------------  Indications: Rest pain, ulceration, and peripheral artery disease. left AKA High Risk Factors: Hypertension, Diabetes.  Comparison Study: No prior exam. Performing Technologist: Edilia Elden Appl  Examination Guidelines: A complete evaluation includes at minimum, Doppler waveform signals and systolic blood pressure reading at the level of bilateral brachial, anterior  tibial, and posterior tibial arteries, when vessel segments are accessible. Bilateral  testing is considered an integral part of a complete examination. Photoelectric Plethysmograph (PPG) waveforms and toe systolic pressure readings are included as required and additional duplex testing as needed. Limited examinations for reoccurring indications may be performed as noted.  ABI Findings: +---------+------------------+-----+---------+--------+ Right    Rt Pressure (mmHg)IndexWaveform Comment  +---------+------------------+-----+---------+--------+ Brachial 138                    triphasic         +---------+------------------+-----+---------+--------+ PTA      153               1.11 biphasic          +---------+------------------+-----+---------+--------+ DP       160               1.16 biphasic          +---------+------------------+-----+---------+--------+ Great Toe51                0.37 Normal            +---------+------------------+-----+---------+--------+ +--------+------------------+-----+---------+-------+ Left    Lt Pressure (mmHg)IndexWaveform Comment +--------+------------------+-----+---------+-------+ [id number]                    triphasic        +--------+------------------+-----+---------+-------+ +-------+-----------+-----------+------------+------------+ ABI/TBIToday's ABIToday's TBIPrevious ABIPrevious TBI +-------+-----------+-----------+------------+------------+ Right  1.16       0.37                                +-------+-----------+-----------+------------+------------+  Summary: Right: Resting right ankle-brachial index is within normal range. The right toe-brachial index is abnormal.  Left:  Above knee amputation. *See table(s) above for measurements and observations.     Preliminary    MR TOES RIGHT W WO CONTRAST Result Date: 06/20/2024 CLINICAL DATA:  Fifth toe ulcer. EXAM: MRI OF THE RIGHT TOES WITHOUT AND WITH CONTRAST TECHNIQUE: Multiplanar, multisequence MR imaging of the fifth toe was performed both before and  after administration of intravenous contrast. CONTRAST:  5mL GADAVIST  GADOBUTROL  1 MMOL/ML IV SOLN COMPARISON:  None Available. FINDINGS: Bones/Joint/Cartilage Soft tissue wound along the plantar aspect of the fifth MTP joint. Bone marrow edema in the fifth metatarsal head and distal diaphysis as well as the base of the fifth proximal phalanx consistent with osteomyelitis. Normal alignment. No joint effusion. Mild osteoarthritis of the first MTP joint. Ligaments Collateral ligaments are intact.  Lisfranc ligament is intact. Muscles and Tendons Flexor, peroneal and extensor compartment tendons are intact. T2 hyperintense signal throughout the plantar musculature likely neurogenic. Soft tissue 1 x 1 x 1.6 cm fluid collection along the plantar lateral aspect of the fifth MTP joint concerning for a small abscess.No soft tissue mass. Soft tissue edema along the dorsal aspect of the forefoot concerning for cellulitis. IMPRESSION: 1. Soft tissue wound along the plantar aspect of the fifth MTP joint. Bone marrow edema in the fifth metatarsal head and distal diaphysis as well as the base of the fifth proximal phalanx consistent with osteomyelitis. 2. A 1 x 1 x 1.6 cm fluid collection along the plantar lateral aspect of the fifth MTP joint concerning for a small abscess. Electronically Signed   By: Julaine Blanch M.D.   On: 06/20/2024 08:38   DG Foot Complete Right Result Date: 06/19/2024 CLINICAL DATA:  Fifth toe ulcer EXAM: RIGHT FOOT COMPLETE - 3+ VIEW COMPARISON:  Radiographs 05/27/2024 FINDINGS: Progressive bony demineralization with cortical destruction in the head of the fifth metatarsal, highly suspicious for active osteomyelitis. Possible involvement of the base of the proximal phalanx small toe. Generalized bony demineralization in the foot noted. Atheromatous vascular calcifications. Small plantar and Achilles calcaneal spurs. Suspected infiltrative subcutaneous edema in the distal calf. Dorsal infiltrative edema in  the forefoot. IMPRESSION: 1. New bony demineralization with cortical destruction in the head of the fifth metatarsal, highly suspicious for active osteomyelitis given the clinical context. Possible involvement of the base of the proximal phalanx small toe. 2. Generalized bony demineralization. 3. Atheromatous vascular calcifications. 4. Suspected infiltrative subcutaneous edema in the distal calf and dorsal forefoot. Electronically Signed   By: Ryan Salvage M.D.   On: 06/19/2024 18:42    Review of Systems Blood pressure (!) 113/46, pulse 77, temperature 98.2 F (36.8 C), temperature source Oral, resp. rate 15, height 5' 1 (1.549 m), weight 47.6 kg, SpO2 98%. Physical Exam General: AAO x3, NAD  Dermatological: Ulceration noted right foot submetatarsal 5 with localized edema and erythema along the lateral foot.  There is also a superficial wound to the lateral aspect the distal leg with localized erythema as pictured below.  No fluctuation or crepitation to the wounds today.         Vascular: Decreased on exam  Neruologic: Decreased  Musculoskeletal: No pain on exam  Gait: Unassisted, Nonantalgic.   Assessment/Plan: Osteomyelitis right foot  I discussed with her and her husband regards options and reviewed the MRI. My recommendation is amputation of the fifth ray.  She is not agreeable to this at this time.  We discussed pros and cons of long-term antibiotics versus antibiotics.  She already has a history of a left above-knee amputation and should the infection spread she is at higher risk of losing the foot or leg which we discussed today.  Podiatry was consulted for possible bone biopsy.  Ideally a bone biopsy would be beneficial to help target antibiotics. However she has been on multiple antibiotics which could distort culture data. Discussed with Dr. Jonel and ID has been consulted. I will place her NPO for tomorrow and discussed possible bone biopsy with the patient and  husband. If needed will likely plan for this tomorrow. Will discuss with Dr. Malvin who is covering tomorrow.   Donnice JONELLE Fees 06/21/2024, 9:57 AM

## 2024-06-21 NOTE — Consult Note (Signed)
 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 female with  hx of DM type 2, PAD, L AKA, HFmrEF, HTN, IDA, recent ED visit with concern for DM foot ulcer and associated cellulitis seen on 8/13 with no evidence of osteomyelitis at that time and was discharged on course of Augmentin , reportedly referred to Podiatry although had not seen, Rx'd course of Bactrim and Doxycycline  since 8/26 by PCP. Presented due to worsening pain in her R foot wound.   Has had  MRI with 5th toe and MT head likely osteomyelitis. Had abnml ABIs. Seen by orhto but patient declines surgery and now podiatry has seen and considering bone bxp. Has been on vanco and zosyn .  No fevers, wbc 8, esr 94, crp 1.7 A1c 8.4   Past Medical History:  Diagnosis Date   Anemia    Diabetes mellitus without complication (HCC)    Pre-diabetes    Past Surgical History:  Procedure Laterality Date   AMPUTATION  04/06/2022   Procedure: AMPUTATION ABOVE KNEE REVISION;  Surgeon: Harden Jerona GAILS, MD;  Location: [facility] OR;  Service: Orthopedics;;   I & D EXTREMITY Left 04/02/2022   Procedure: IRRIGATION AND DEBRIDEMENT LEFT LOWER LEG APPLICATION OF WOUND VAC ;  Surgeon: Doll Skates, MD;  Location: [facility] OR;  Service: Orthopedics;  Laterality: Left;   I & D EXTREMITY Left 04/04/2022   Procedure: LEFT ABOVE KNEE AMPUTATION;  Surgeon: Harden Jerona GAILS, MD;  Location: [facility] OR;  Service: Orthopedics;  Laterality: Left;   I & D EXTREMITY Left 04/06/2022   Procedure: LEFT LEG DEBRIDEMENT;  Surgeon: Harden Jerona GAILS, MD;  Location: [facility] OR;  Service: Orthopedics;  Laterality: Left;   STUMP REVISION Left 04/11/2022   Procedure: REVISION LEFT ABOVE KNEE AMPUTATION;  Surgeon: Harden Jerona GAILS, MD;  Location: [facility] OR;  Service: Orthopedics;  Laterality: Left;   Social History   Tobacco Use    Smoking status: Never   Smokeless tobacco: Never  Vaping Use   Vaping status: Never Used  Substance Use Topics   Alcohol  use: Never   Drug use: Never   Family History  Problem Relation Age of Onset   Heart attack Mother    Heart disease Mother     Allergies:  Allergies  Allergen Reactions   Other     States she is allergic to abx but does not know name    Amoxicillin  Diarrhea    Current antibiotics: Antibiotics Given (last 72 hours)     Date/Time Action Medication Dose Rate   06/19/24 2235 New Bag/Given   Ampicillin -Sulbactam (UNASYN ) 3 g in sodium chloride  0.9 % 100 mL IVPB 3 g 200 mL/hr   06/20/24 0318 New Bag/Given  [pt lost IV access]   vancomycin  (VANCOREADY) IVPB 1250 mg/250 mL 1,250 mg 166.7 mL/hr   06/20/24 9373 New Bag/Given   piperacillin -tazobactam (ZOSYN ) IVPB 3.375 g 3.375 g 12.5 mL/hr   06/20/24 1437 New Bag/Given   piperacillin -tazobactam (ZOSYN ) IVPB 3.375 g 3.375 g 12.5 mL/hr   06/20/24 2233 New Bag/Given   piperacillin -tazobactam (ZOSYN ) IVPB 3.375 g 3.375 g 12.5 mL/hr   06/21/24 9392 New Bag/Given   piperacillin -tazobactam (ZOSYN ) IVPB 3.375 g 3.375 g 12.5 mL/hr       MEDICATIONS:  dorzolamide -timolol   1 drop Both Eyes BID  furosemide   20 mg Oral Daily   heparin   5,000 Units Subcutaneous Q12H   insulin  aspart  0-6 Units Subcutaneous TID WC   insulin  glargine  6 Units Subcutaneous QHS   nystatin   1 Application Topical TID   pantoprazole   40 mg Oral BID   rosuvastatin   20 mg Oral QHS   sodium chloride  flush  3 mL Intravenous Q12H   spironolactone   25 mg Oral Daily    Review of Systems - 11 systems reviewed and negative per HPI   OBJECTIVE: Temp:  [97.6 F (36.4 C)-98.2 F (36.8 C)] 98.2 F (36.8 C) (09/07 0901) Pulse Rate:  [77-85] 77 (09/07 0901) Resp:  [15-18] 15 (09/07 0901) BP: (109-132)/(46-57) 113/46 (09/07 0901) SpO2:  [94 %-99 %] 98 % (09/07 0901) Physical Exam  Constitutional:  oriented to person, place, and time.  frail Cardiovascular: Normal rate, regular rhythm and normal heart sounds. Exam reveals no gallop and no friction rub.  No murmur heard.  Pulmonary/Chest: Effort normal and breath sounds normal. No respiratory distress.  has no wheezes.  Neurological: alert and oriented to person, place, and time.  Skin see photo below area of black eschar is soft and boggy  Psychiatric: a normal mood and affect.  behavior is normal.    LABS: Results for orders placed or performed during the hospital encounter of 06/19/24 (from the past 48 hours)  CBC with Differential     Status: Abnormal   Collection Time: 06/19/24  5:39 PM  Result Value Ref Range   WBC 6.1 4.0 - 10.5 K/uL   RBC 3.23 (L) 3.87 - 5.11 MIL/uL   Hemoglobin 9.2 (L) 12.0 - 15.0 g/dL   HCT 71.8 (L) 63.9 - 53.9 %   MCV 87.0 80.0 - 100.0 fL   MCH 28.5 26.0 - 34.0 pg   MCHC 32.7 30.0 - 36.0 g/dL   RDW 85.3 88.4 - 84.4 %   Platelets 426 (H) 150 - 400 K/uL   nRBC 0.0 0.0 - 0.2 %   Neutrophils Relative % 72 %   Neutro Abs 4.3 1.7 - 7.7 K/uL   Lymphocytes Relative 21 %   Lymphs Abs 1.3 0.7 - 4.0 K/uL   Monocytes Relative 5 %   Monocytes Absolute 0.3 0.1 - 1.0 K/uL   Eosinophils Relative 2 %   Eosinophils Absolute 0.1 0.0 - 0.5 K/uL   Basophils Relative 0 %   Basophils Absolute 0.0 0.0 - 0.1 K/uL   Immature Granulocytes 0 %   Abs Immature Granulocytes 0.02 0.00 - 0.07 K/uL    Comment: Performed at [facility], [address] [place], [place] [address]  Comprehensive metabolic panel     Status: Abnormal   Collection Time: 06/19/24  5:39 PM  Result Value Ref Range   Sodium 133 (L) 135 - 145 mmol/L   Potassium 4.4 3.5 - 5.1 mmol/L   Chloride 98 98 - 111 mmol/L   CO2 24 22 - 32 mmol/L   Glucose, Bld 255 (H) 70 - 99 mg/dL    Comment: Glucose reference range applies only to samples taken after fasting for at least 8 hours.   BUN 20 8 - 23 mg/dL   Creatinine, Ser 8.76 (H) 0.44 - 1.00 mg/dL   Calcium  9.4 8.9 - 10.3 mg/dL   Total  Protein 7.9 6.5 - 8.1 g/dL   Albumin  3.3 (L) 3.5 - 5.0 g/dL   AST 19 15 - 41 U/L   ALT 19 0 - 44 U/L  Alkaline Phosphatase 132 (H) 38 - 126 U/L   Total Bilirubin 0.5 0.0 - 1.2 mg/dL   GFR, Estimated 47 (L) >60 mL/min    Comment: (NOTE) Calculated using the CKD-EPI Creatinine Equation (2021)    Anion gap 11 5 - 15    Comment: Performed at [facility], [address] [place], [place] [address]  Blood culture (routine x 2)     Status: None (Preliminary result)   Collection Time: 06/19/24  5:39 PM   Specimen: BLOOD RIGHT HAND  Result Value Ref Range   Specimen Description BLOOD RIGHT HAND    Special Requests      BOTTLES DRAWN AEROBIC AND ANAEROBIC Blood Culture results may not be optimal due to an inadequate volume of blood received in culture bottles   Culture      NO GROWTH 2 DAYS Performed at [facility], [address], [place], [place] [address]    Report Status PENDING   I-Stat Lactic Acid     Status: None   Collection Time: 06/19/24  5:53 PM  Result Value Ref Range   Lactic Acid, Venous 1.0 0.5 - 1.9 mmol/L  I-stat chem 8, ED     Status: Abnormal   Collection Time: 06/19/24  5:53 PM  Result Value Ref Range   Sodium 134 (L) 135 - 145 mmol/L   Potassium 4.5 3.5 - 5.1 mmol/L   Chloride 100 98 - 111 mmol/L   BUN 22 8 - 23 mg/dL   Creatinine, Ser 8.79 (H) 0.44 - 1.00 mg/dL   Glucose, Bld 746 (H) 70 - 99 mg/dL    Comment: Glucose reference range applies only to samples taken after fasting for at least 8 hours.   Calcium , Ion 1.23 1.15 - 1.40 mmol/L   TCO2 23 22 - 32 mmol/L   Hemoglobin 9.9 (L) 12.0 - 15.0 g/dL   HCT 70.9 (L) 63.9 - 53.9 %  Blood culture (routine x 2)     Status: None (Preliminary result)   Collection Time: 06/19/24 10:24 PM   Specimen: BLOOD  Result Value Ref Range   Specimen Description BLOOD SITE NOT SPECIFIED    Special Requests      BOTTLES DRAWN AEROBIC AND ANAEROBIC Blood Culture adequate volume   Culture      NO GROWTH 2  DAYS Performed at [facility], [address], [place], [place] [address]    Report Status PENDING   I-Stat Lactic Acid     Status: None   Collection Time: 06/19/24 10:24 PM  Result Value Ref Range   Lactic Acid, Venous 1.0 0.5 - 1.9 mmol/L  Glucose, capillary     Status: Abnormal   Collection Time: 06/20/24  1:53 AM  Result Value Ref Range   Glucose-Capillary 205 (H) 70 - 99 mg/dL    Comment: Glucose reference range applies only to samples taken after fasting for at least 8 hours.  Glucose, capillary     Status: Abnormal   Collection Time: 06/20/24  6:28 AM  Result Value Ref Range   Glucose-Capillary 201 (H) 70 - 99 mg/dL    Comment: Glucose reference range applies only to samples taken after fasting for at least 8 hours.  Basic metabolic panel with GFR     Status: Abnormal   Collection Time: 06/20/24  6:33 AM  Result Value Ref Range   Sodium 134 (L) 135 - 145 mmol/L   Potassium 4.1 3.5 - 5.1 mmol/L   Chloride 100  98 - 111 mmol/L   CO2 22 22 - 32 mmol/L   Glucose, Bld 216 (H) 70 - 99 mg/dL    Comment: Glucose reference range applies only to samples taken after fasting for at least 8 hours.   BUN 20 8 - 23 mg/dL   Creatinine, Ser 8.94 (H) 0.44 - 1.00 mg/dL   Calcium  8.9 8.9 - 10.3 mg/dL   GFR, Estimated 57 (L) >60 mL/min    Comment: (NOTE) Calculated using the CKD-EPI Creatinine Equation (2021)    Anion gap 12 5 - 15    Comment: Performed at [facility], [address], [place], [place] [address]  CBC     Status: Abnormal   Collection Time: 06/20/24  6:33 AM  Result Value Ref Range   WBC 7.8 4.0 - 10.5 K/uL   RBC 2.89 (L) 3.87 - 5.11 MIL/uL   Hemoglobin 8.0 (L) 12.0 - 15.0 g/dL   HCT 75.1 (L) 63.9 - 53.9 %   MCV 85.8 80.0 - 100.0 fL   MCH 27.7 26.0 - 34.0 pg   MCHC 32.3 30.0 - 36.0 g/dL   RDW 85.5 88.4 - 84.4 %   Platelets 376 150 - 400 K/uL   nRBC 0.0 0.0 - 0.2 %    Comment: Performed at [facility], [address], [place], [place] [address]   Magnesium      Status: Abnormal   Collection Time: 06/20/24  6:33 AM  Result Value Ref Range   Magnesium  1.6 (L) 1.7 - 2.4 mg/dL    Comment: Performed at [facility], [address] [place], [place] [address]  Phosphorus     Status: None   Collection Time: 06/20/24  6:33 AM  Result Value Ref Range   Phosphorus 2.7 2.5 - 4.6 mg/dL    Comment: Performed at [facility], [address], [place], [place] [address]  C-reactive protein     Status: Abnormal   Collection Time: 06/20/24  6:33 AM  Result Value Ref Range   CRP 1.7 (H) <1.0 mg/dL    Comment: Performed at [facility], [address] [place], [place] [address]  Sedimentation rate     Status: Abnormal   Collection Time: 06/20/24  6:33 AM  Result Value Ref Range   Sed Rate 94 (H) 0 - 22 mm/hr    Comment: Performed at [facility], [address], [place], [place] [address]  Brain natriuretic peptide     Status: Abnormal   Collection Time: 06/20/24  6:33 AM  Result Value Ref Range   B Natriuretic Peptide 125.1 (H) 0.0 - 100.0 pg/mL    Comment: Performed at [facility], [address] [place], [place] [address]  Lipid panel     Status: None   Collection Time: 06/20/24  6:33 AM  Result Value Ref Range   Cholesterol 148 0 - 200 mg/dL   Triglycerides 887 <849 mg/dL   HDL 44 >59 mg/dL   Total CHOL/HDL Ratio 3.4 RATIO   VLDL 22 0 - 40 mg/dL   LDL Cholesterol 82 0 - 99 mg/dL    Comment:        Total Cholesterol/HDL:CHD Risk Coronary Heart Disease Risk Table                     Men   Women  1/2 Average Risk   3.4   3.3  Average Risk  5.0   4.4  2 X Average Risk   9.6   7.1  3 X Average Risk  23.4   11.0        Use the calculated Patient Ratio above and the CHD Risk Table to determine the patient's CHD Risk.        ATP III CLASSIFICATION (LDL):  <100     mg/dL   Optimal  899-870  mg/dL   Near or Above                    Optimal  130-159  mg/dL   Borderline  839-810  mg/dL   High   >809     mg/dL   Very High Performed at [facility], [address], [place], [place] [address]   Hemoglobin A1c     Status: Abnormal   Collection Time: 06/20/24  6:33 AM  Result Value Ref Range   Hgb A1c MFr Bld 8.4 (H) 4.8 - 5.6 %    Comment: (NOTE) Diagnosis of Diabetes The following HbA1c ranges recommended by the American Diabetes Association (ADA) may be used as an aid in the diagnosis of diabetes mellitus.  Hemoglobin             Suggested A1C NGSP%              Diagnosis  <5.7                   Non Diabetic  5.7-6.4                Pre-Diabetic  >6.4                   Diabetic  <7.0                   Glycemic control for                       adults with diabetes.     Mean Plasma Glucose 194.38 mg/dL    Comment: Performed at [facility], [address] [place], [place] [address]  Glucose, capillary     Status: Abnormal   Collection Time: 06/20/24 12:52 PM  Result Value Ref Range   Glucose-Capillary 119 (H) 70 - 99 mg/dL    Comment: Glucose reference range applies only to samples taken after fasting for at least 8 hours.  Glucose, capillary     Status: Abnormal   Collection Time: 06/20/24  4:39 PM  Result Value Ref Range   Glucose-Capillary 108 (H) 70 - 99 mg/dL    Comment: Glucose reference range applies only to samples taken after fasting for at least 8 hours.  Glucose, capillary     Status: Abnormal   Collection Time: 06/20/24  9:31 PM  Result Value Ref Range   Glucose-Capillary 178 (H) 70 - 99 mg/dL    Comment: Glucose reference range applies only to samples taken after fasting for at least 8 hours.  CBC     Status: Abnormal   Collection Time: 06/21/24  4:34 AM  Result Value Ref Range   WBC 8.1 4.0 - 10.5 K/uL   RBC 2.80 (L) 3.87 - 5.11 MIL/uL   Hemoglobin 7.8 (L) 12.0 - 15.0 g/dL   HCT 76.1 (L) 63.9 - 53.9 %   MCV 85.0 80.0 - 100.0 fL   MCH 27.9 26.0 - 34.0 pg   MCHC 32.8 30.0 - 36.0 g/dL  RDW 14.6 11.5 - 15.5 %   Platelets 370 150 -  400 K/uL   nRBC 0.0 0.0 - 0.2 %    Comment: Performed at [facility], [address] [place], [place] [address]  Basic metabolic panel with GFR     Status: Abnormal   Collection Time: 06/21/24  4:34 AM  Result Value Ref Range   Sodium 134 (L) 135 - 145 mmol/L   Potassium 3.7 3.5 - 5.1 mmol/L   Chloride 99 98 - 111 mmol/L   CO2 24 22 - 32 mmol/L   Glucose, Bld 87 70 - 99 mg/dL    Comment: Glucose reference range applies only to samples taken after fasting for at least 8 hours.   BUN 24 (H) 8 - 23 mg/dL   Creatinine, Ser 8.61 (H) 0.44 - 1.00 mg/dL   Calcium  9.0 8.9 - 10.3 mg/dL   GFR, Estimated 41 (L) >60 mL/min    Comment: (NOTE) Calculated using the CKD-EPI Creatinine Equation (2021)    Anion gap 11 5 - 15    Comment: Performed at [facility], [address], [place], [place] [address]  Glucose, capillary     Status: None   Collection Time: 06/21/24  7:02 AM  Result Value Ref Range   Glucose-Capillary 78 70 - 99 mg/dL    Comment: Glucose reference range applies only to samples taken after fasting for at least 8 hours.  Glucose, capillary     Status: Abnormal   Collection Time: 06/21/24 11:35 AM  Result Value Ref Range   Glucose-Capillary 157 (H) 70 - 99 mg/dL    Comment: Glucose reference range applies only to samples taken after fasting for at least 8 hours.   No components found for: ESR, C REACTIVE PROTEIN MICRO: Recent Results (from the past 720 hours)  Blood culture (routine x 2)     Status: None (Preliminary result)   Collection Time: 06/19/24  5:39 PM   Specimen: BLOOD RIGHT HAND  Result Value Ref Range Status   Specimen Description BLOOD RIGHT HAND  Final   Special Requests   Final    BOTTLES DRAWN AEROBIC AND ANAEROBIC Blood Culture results may not be optimal due to an inadequate volume of blood received in culture bottles   Culture   Final    NO GROWTH 2 DAYS Performed at [facility], [address], [place], [place] [address]     Report Status PENDING  Incomplete  Blood culture (routine x 2)     Status: None (Preliminary result)   Collection Time: 06/19/24 10:24 PM   Specimen: BLOOD  Result Value Ref Range Status   Specimen Description BLOOD SITE NOT SPECIFIED  Final   Special Requests   Final    BOTTLES DRAWN AEROBIC AND ANAEROBIC Blood Culture adequate volume   Culture   Final    NO GROWTH 2 DAYS Performed at [facility], [address] [place], [place] [address]    Report Status PENDING  Incomplete    IMAGING: VAS US  ABI WITH/WO TBI Result Date: 06/20/2024  LOWER EXTREMITY DOPPLER STUDY Patient Name:  Jill Munoz  Date of Exam:   06/20/2024 Medical Rec #: [id number]        Accession #:    [id number] Date of Birth: [birth year]         Patient Gender: F Patient Age:   24 years Exam Location:  [facility] Procedure:      VAS  US  ABI WITH/WO TBI Referring Phys: JONATHAN SEGARS --------------------------------------------------------------------------------  Indications: Rest pain, ulceration, and peripheral artery disease. left AKA High Risk Factors: Hypertension, Diabetes.  Comparison Study: No prior exam. Performing Technologist: Edilia Elden Appl  Examination Guidelines: A complete evaluation includes at minimum, Doppler waveform signals and systolic blood pressure reading at the level of bilateral brachial, anterior tibial, and posterior tibial arteries, when vessel segments are accessible. Bilateral testing is considered an integral part of a complete examination. Photoelectric Plethysmograph (PPG) waveforms and toe systolic pressure readings are included as required and additional duplex testing as needed. Limited examinations for reoccurring indications may be performed as noted.  ABI Findings: +---------+------------------+-----+---------+--------+ Right    Rt Pressure (mmHg)IndexWaveform Comment  +---------+------------------+-----+---------+--------+ Brachial 138                    triphasic          +---------+------------------+-----+---------+--------+ PTA      153               1.11 biphasic          +---------+------------------+-----+---------+--------+ DP       160               1.16 biphasic          +---------+------------------+-----+---------+--------+ Great Toe51                0.37 Normal            +---------+------------------+-----+---------+--------+ +--------+------------------+-----+---------+-------+ Left    Lt Pressure (mmHg)IndexWaveform Comment +--------+------------------+-----+---------+-------+ [id number]                    triphasic        +--------+------------------+-----+---------+-------+ +-------+-----------+-----------+------------+------------+ ABI/TBIToday's ABIToday's TBIPrevious ABIPrevious TBI +-------+-----------+-----------+------------+------------+ Right  1.16       0.37                                +-------+-----------+-----------+------------+------------+  Summary: Right: Resting right ankle-brachial index is within normal range. The right toe-brachial index is abnormal.  Left:  Above knee amputation. *See table(s) above for measurements and observations.     Preliminary    MR TOES RIGHT W WO CONTRAST Result Date: 06/20/2024 CLINICAL DATA:  Fifth toe ulcer. EXAM: MRI OF THE RIGHT TOES WITHOUT AND WITH CONTRAST TECHNIQUE: Multiplanar, multisequence MR imaging of the fifth toe was performed both before and after administration of intravenous contrast. CONTRAST:  5mL GADAVIST  GADOBUTROL  1 MMOL/ML IV SOLN COMPARISON:  None Available. FINDINGS: Bones/Joint/Cartilage Soft tissue wound along the plantar aspect of the fifth MTP joint. Bone marrow edema in the fifth metatarsal head and distal diaphysis as well as the base of the fifth proximal phalanx consistent with osteomyelitis. Normal alignment. No joint effusion. Mild osteoarthritis of the first MTP joint. Ligaments Collateral ligaments are intact.  Lisfranc ligament  is intact. Muscles and Tendons Flexor, peroneal and extensor compartment tendons are intact. T2 hyperintense signal throughout the plantar musculature likely neurogenic. Soft tissue 1 x 1 x 1.6 cm fluid collection along the plantar lateral aspect of the fifth MTP joint concerning for a small abscess.No soft tissue mass. Soft tissue edema along the dorsal aspect of the forefoot concerning for cellulitis. IMPRESSION: 1. Soft tissue wound along the plantar aspect of the fifth MTP joint. Bone marrow edema in the fifth metatarsal head and distal diaphysis as well as the base of the fifth proximal phalanx consistent with osteomyelitis. 2. A 1  x 1 x 1.6 cm fluid collection along the plantar lateral aspect of the fifth MTP joint concerning for a small abscess. Electronically Signed   By: Julaine Blanch M.D.   On: 06/20/2024 08:38   DG Foot Complete Right Result Date: 06/19/2024 CLINICAL DATA:  Fifth toe ulcer EXAM: RIGHT FOOT COMPLETE - 3+ VIEW COMPARISON:  Radiographs 05/27/2024 FINDINGS: Progressive bony demineralization with cortical destruction in the head of the fifth metatarsal, highly suspicious for active osteomyelitis. Possible involvement of the base of the proximal phalanx small toe. Generalized bony demineralization in the foot noted. Atheromatous vascular calcifications. Small plantar and Achilles calcaneal spurs. Suspected infiltrative subcutaneous edema in the distal calf. Dorsal infiltrative edema in the forefoot. IMPRESSION: 1. New bony demineralization with cortical destruction in the head of the fifth metatarsal, highly suspicious for active osteomyelitis given the clinical context. Possible involvement of the base of the proximal phalanx small toe. 2. Generalized bony demineralization. 3. Atheromatous vascular calcifications. 4. Suspected infiltrative subcutaneous edema in the distal calf and dorsal forefoot. Electronically Signed   By: Ryan Salvage M.D.   On: 06/19/2024 18:42   DG Ankle Complete  Right Result Date: 05/27/2024 CLINICAL DATA:  Right ankle swelling. EXAM: RIGHT ANKLE - COMPLETE 3+ VIEW COMPARISON:  None Available. FINDINGS: There is no evidence of fracture, dislocation, or joint effusion. There is no evidence of arthropathy or other focal bone abnormality. Vascular calcifications are noted. IMPRESSION: No acute bony abnormality seen. Electronically Signed   By: Lynwood Landy Raddle M.D.   On: 05/27/2024 15:24   DG Foot Complete Right Result Date: 05/27/2024 CLINICAL DATA:  Right foot swelling. EXAM: RIGHT FOOT COMPLETE - 3+ VIEW COMPARISON:  None Available. FINDINGS: There is no evidence of fracture or dislocation. There is no evidence of arthropathy or other focal bone abnormality. Vascular calcifications are noted. Dorsal soft tissue swelling is noted. IMPRESSION: No definite bony abnormality is noted. Dorsal soft tissue swelling is noted. Electronically Signed   By: Lynwood Landy Raddle M.D.   On: 05/27/2024 15:22    Assessment:   Lakely Elmendorf is a 73 y.o. female with poorly controlled DM, PAD, s/p L AKA, HTN with chronic R foot ulcer now progressed to likely osteomyelitis of toe and 5th MT head. Elevated ESR, CRP. Small abscess possible on MRI I had long discussion with patient and husband. She is at great risk of losing her R leg and already has  L [place]. She is not very mobile already so overall may not be the worst thing to have bil LE ampuitation but she wishes to avoid this. I too advised that surgery would give best hope for healing this but the still decline. They are not willing to consider the surgery proposed by ortho and podiatry with resection of infected bone but are willing to do a bone bxp.    It might be good to clean up the wound and assess for an abscess while getting bone bxp. The husband also reports that she did not do well from GI viewpoint with the recent oral abx but cannot tell me which ones she did not tolerate  Recommendations Hold abx Bone bxp for path and  culture (routine and fungal) We discussed PICC and HH for IV abx At this point this is limb salvage so would start with 2 weeks IV zosyn  and daptomcyin.  Can tailor abx based on cx results Hopefully can do oral options after that for more prolonged course.  WIll need careful wound care and  DM management May need repeat vascular evaluation   Despite all this she is still at risk of limb loss and we discussed this.  Thank you very much for allowing me to participate in the care of this patient. Please call with questions.   Alm SQUIBB. Epifanio, MD

## 2024-06-21 NOTE — Progress Notes (Signed)
  Progress Note   Patient: Jill Munoz FMW:[id number] DOB: January 22, 1951 DOA: 06/19/2024     1 DOS: the patient was seen and examined on 06/21/2024 at 11;45AM      Brief hospital course: 73 y.o. F with PVD s/p L AKA, DM poorly controlled, HTN presented with non-healing DFI. Found to have osteo of the 5th metatarsal head on MRI.      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  Family asked for second opinion.  I have obliged this, and there is guarded offer of bone biopsy and target antibiotic therapy. - Hold antibiotics for now - Consult ID - PICC - Bone biopsy tomorrow   Diabetes Blood sugars controlled - Continue glargine - Continue sliding scale correction insulin  - Hold Jardiance   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 moderately reduced ejection fraction Treated with diuretics on admission.  Now resolved. - Continue home p.o. furosemide  - Continue spironolactone , losartan   Peripheral vascular disease Hypertension Hyperlipidemia ABIs this admission shows normal ankle-brachial index, abnormal toe brachial index - Continue furosemide , spironolactone  - Continue Crestor   Chronic kidney disease stage II  Anemia of chronic disease Hemoglobin stable       Subjective: Patient has no new concerns.  Pain is better, redness is unchanged.  She is extremely grateful.  I have again expressed my reservations about attempts at medical therapy.     Physical Exam: BP (!) 117/54 (BP Location: Right Arm)   Pulse 73   Temp (!) 97.5 F (36.4 C) (Oral)   Resp 17   Ht 5' 1 (1.549 m)   Wt 47.6 kg   SpO2 99%   BMI 19.84 kg/m   Elderly adult female, lying in bed, interactive and appropriate RRR, no murmurs, no peripheral edema Respiratory normal, lungs clear without rales or wheezes Abdomen soft no tenderness palpation or guarding, no ascites or distention  Data  Reviewed: Cr close to stable, Hgb trending down Extensive discussions with Podiatry, ID     Family Communication: Husband    Disposition: Status is: Inpatient         Author: [name], MD 06/21/2024 3:04 PM  For on call review [email or web address].

## 2024-06-21 NOTE — Hospital Course (Signed)
 73 y.o. F with PVD s/p L AKA, DM poorly controlled, HTN presented with non-healing DFI. Found to have osteo of the 5th metatarsal head on MRI.

## 2024-06-21 NOTE — Plan of Care (Signed)

## 2024-06-22 DIAGNOSIS — M869 Osteomyelitis, unspecified: Secondary | ICD-10-CM | POA: Diagnosis not present

## 2024-06-22 LAB — CBC
HCT: 23.6 % — ABNORMAL LOW (ref 36.0–46.0)
Hemoglobin: 7.8 g/dL — ABNORMAL LOW (ref 12.0–15.0)
MCH: 28.3 pg (ref 26.0–34.0)
MCHC: 33.1 g/dL (ref 30.0–36.0)
MCV: 85.5 fL (ref 80.0–100.0)
Platelets: 333 K/uL (ref 150–400)
RBC: 2.76 MIL/uL — ABNORMAL LOW (ref 3.87–5.11)
RDW: 14.4 % (ref 11.5–15.5)
WBC: 7.4 K/uL (ref 4.0–10.5)
nRBC: 0 % (ref 0.0–0.2)

## 2024-06-22 LAB — BASIC METABOLIC PANEL WITH GFR
Anion gap: 12 (ref 5–15)
BUN: 26 mg/dL — ABNORMAL HIGH (ref 8–23)
CO2: 24 mmol/L (ref 22–32)
Calcium: 8.9 mg/dL (ref 8.9–10.3)
Chloride: 99 mmol/L (ref 98–111)
Creatinine, Ser: 1.35 mg/dL — ABNORMAL HIGH (ref 0.44–1.00)
GFR, Estimated: 42 mL/min — ABNORMAL LOW (ref >60)
Glucose, Bld: 79 mg/dL (ref 70–99)
Potassium: 4 mmol/L (ref 3.5–5.1)
Sodium: 135 mmol/L (ref 135–145)

## 2024-06-22 LAB — GLUCOSE, CAPILLARY
Glucose-Capillary: 77 mg/dL (ref 70–99)
Glucose-Capillary: 127 mg/dL — ABNORMAL HIGH (ref 70–99)
Glucose-Capillary: 118 mg/dL — ABNORMAL HIGH (ref 70–99)
Glucose-Capillary: 178 mg/dL — ABNORMAL HIGH (ref 70–99)

## 2024-06-22 MED ORDER — SODIUM CHLORIDE 0.9 % IV SOLN
2.0000 g | INTRAVENOUS | Status: DC
  Administered 2024-06-22 – 2024-06-25 (×4): 2 g via INTRAVENOUS
  Filled 2024-06-22 (×4): qty 20

## 2024-06-22 MED ORDER — SODIUM CHLORIDE 0.9 % IV SOLN
8.0000 mg/kg | INTRAVENOUS | Status: DC
  Administered 2024-06-22: 400 mg via INTRAVENOUS
  Filled 2024-06-22: qty 8

## 2024-06-22 MED ORDER — FLEET ENEMA RE ENEM
1.0000 | ENEMA | Freq: Once | RECTAL | Status: AC
  Administered 2024-06-22: 1 via RECTAL
  Filled 2024-06-22: qty 1

## 2024-06-22 NOTE — Progress Notes (Signed)
 Brief ID note. Noted that she's had her bone culture collected and pending. We'll wait for the gram stain results.  Start daptomycin  and Ceftriaxone . Will follow and make adjustments as appropriate   Corean Fireman, NP

## 2024-06-22 NOTE — Plan of Care (Signed)

## 2024-06-22 NOTE — Progress Notes (Signed)
   PODIATRY PROGRESS NOTE Patient Name: Jill Munoz  DOB 19-Dec-1950 DOA 06/19/2024  Hospital Day: 4  Assessment:  73 y.o. female with PVD s/p L AKA, DM poorly controlled, HTN with R foot ulceration sub 5th MPJ with underlying acute osteomyelitis.   AF, VSS  WBC: 7.4 ESR/CRP: 94 / 1.7  Wound/Bone Cultures: Taken today bedside - 1. Ulceration tissue with underlying fibropurulent tissue, 2. deep 5th MPJ swab culture  Imaging: XR and MRI: concern for osteomyelitis acute 5th met head with erosion laterally, also 5th proximal phalanx base OM  Plan:  - Debrided the ulceration bedside excisionally to level of bone - wound did probe direct to 5th met head. Small amount of fibropurulent drainage was debrided. Tissue culture and swab culture obtained as above. - Do not feel she will heal the ulceration with local wound care and abx, partial 5th ray amputation continues to be recommended.  - Additionally given ABI PVR studies and limited bleeding seen during debridement would require vascular consultation if surgery were to be pursued . - Recommend daily betadine  gauze dressing change per RN - Abx per ID, appreciate recommendations.  - recommend [facility] RN for dressing changes if not able to do this herself/husband.  - Will sign off, should patient change her mind re desire for surgery will be available         Jill Munoz, DPM [facility]    Subjective:  Pt continues to state wound is looking fine and wants to continue on abx. Discussed risks and she is aware.   Objective:   Vitals:   06/22/24 0516 06/22/24 0747  BP: (!) 113/58 (!) 126/55  Pulse: 78 71  Resp: 16 16  Temp: 97.8 F (36.6 C) (!) 97.4 F (36.3 C)  SpO2: 96% 97%       Latest Ref Rng & Units 06/22/2024    7:17 AM 06/21/2024    4:34 AM 06/20/2024    6:33 AM  CBC  WBC 4.0 - 10.5 K/uL 7.4  8.1  7.8   Hemoglobin 12.0 - 15.0 g/dL 7.8  7.8  8.0   Hematocrit 36.0 - 46.0 % 23.6  23.8  24.8   Platelets 150 -  400 K/uL 333  370  376        Latest Ref Rng & Units 06/22/2024    7:17 AM 06/21/2024    4:34 AM 06/20/2024    6:33 AM  BMP  Glucose 70 - 99 mg/dL 79  87  783   BUN 8 - 23 mg/dL 26  24  20    Creatinine 0.44 - 1.00 mg/dL 8.64  8.61  8.94   Sodium 135 - 145 mmol/L 135  134  134   Potassium 3.5 - 5.1 mmol/L 4.0  3.7  4.1   Chloride 98 - 111 mmol/L 99  99  100   CO2 22 - 32 mmol/L 24  24  22    Calcium  8.9 - 10.3 mg/dL 8.9  9.0  8.9     General: AAOx3, NAD  Lower Extremity Exam L AKA  R ulceration sub 5th MPJ with eschar overlying, upon debridement probes to bone with fibropurulent drainage. Malodor is present.   Diminshed sensation to touch  Absent pulses R foot  Radiology:  Results reviewed. See assessment for pertinent imaging results

## 2024-06-22 NOTE — Progress Notes (Signed)
  Progress Note   Patient: Jill Munoz FMW:[id number] DOB: [birth year] DOA: 06/19/2024     2 DOS: the patient was seen and examined on 06/22/2024 at 9:34 AM      Brief hospital course: 73 y.o. F with PVD s/p L AKA, DM poorly controlled, HTN presented with non-healing DFI. Found to have osteo of the 5th metatarsal head on MRI.      Assessment and Plan: Diabetic foot ulcer with osteomyelitis of the fifth metatarsal head and proximal phalanx See note from 9/7.  Biopsy obtained today. - Rocephin  and daptomycin  -Follow culture data and tailor antibiotics prior to discharge  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 moderately reduced ejection fraction Treated with diuretics, now resolved - Continue home furosemide , losartan , spironolactone  -Hold Jardiance   Peripheral vascular disease Hyperlipidemia Hypertension ABI this admission showed microcirculatory dysfunction - Continue Crestor , furosemide , spironolactone   Chronic kidney disease stage II  Anemia of chronic disease Hemoglobin drifting down, no clinical bleeding  Diabetes Blood sugars controlled - Continue glargine, sliding scale corrections - Hold Jardiance            Subjective: Patient has no new complaints, no concerns     Physical Exam: BP (!) 126/55 (BP Location: Right Arm)   Pulse 71   Temp (!) 97.4 F (36.3 C) (Oral)   Resp 16   Ht 5' 1 (1.549 m)   Wt 47.6 kg   SpO2 97%   BMI 19.84 kg/m   Elderly adult female, lying in bed, interactive and appropriate RRR, no peripheral edema, no murmurs Respiratory rate normal, lungs clear without rales or wheezes Abdomen soft, no tenderness palpation or guarding, no ascites or distention She has generalized weakness but symmetric strength, she has a left AKA The right foot is now wrapped postbiopsy    Data Reviewed: Basic metabolic panel shows stable creatinine CBC shows stable anemia Discussed with infectious disease and podiatry    Family  Communication: Husband at the bedside    Disposition: Status is: Inpatient         Author: [name], MD 06/22/2024 1:22 PM  For on call review [email or web address].

## 2024-06-22 NOTE — Plan of Care (Signed)

## 2024-06-23 DIAGNOSIS — M86171 Other acute osteomyelitis, right ankle and foot: Secondary | ICD-10-CM | POA: Diagnosis not present

## 2024-06-23 DIAGNOSIS — M869 Osteomyelitis, unspecified: Secondary | ICD-10-CM | POA: Diagnosis not present

## 2024-06-23 DIAGNOSIS — L02611 Cutaneous abscess of right foot: Secondary | ICD-10-CM

## 2024-06-23 LAB — COMPREHENSIVE METABOLIC PANEL WITH GFR
ALT: 15 U/L (ref 0–44)
AST: 18 U/L (ref 15–41)
Albumin: 2.7 g/dL — ABNORMAL LOW (ref 3.5–5.0)
Alkaline Phosphatase: 104 U/L (ref 38–126)
Anion gap: 11 (ref 5–15)
BUN: 28 mg/dL — ABNORMAL HIGH (ref 8–23)
CO2: 24 mmol/L (ref 22–32)
Calcium: 8.8 mg/dL — ABNORMAL LOW (ref 8.9–10.3)
Chloride: 99 mmol/L (ref 98–111)
Creatinine, Ser: 1.09 mg/dL — ABNORMAL HIGH (ref 0.44–1.00)
GFR, Estimated: 54 mL/min — ABNORMAL LOW (ref >60)
Glucose, Bld: 173 mg/dL — ABNORMAL HIGH (ref 70–99)
Potassium: 3.5 mmol/L (ref 3.5–5.1)
Sodium: 134 mmol/L — ABNORMAL LOW (ref 135–145)
Total Bilirubin: 0.4 mg/dL (ref 0.0–1.2)
Total Protein: 6.7 g/dL (ref 6.5–8.1)

## 2024-06-23 LAB — CBC
HCT: 24.6 % — ABNORMAL LOW (ref 36.0–46.0)
Hemoglobin: 8.3 g/dL — ABNORMAL LOW (ref 12.0–15.0)
MCH: 28.7 pg (ref 26.0–34.0)
MCHC: 33.7 g/dL (ref 30.0–36.0)
MCV: 85.1 fL (ref 80.0–100.0)
Platelets: 347 K/uL (ref 150–400)
RBC: 2.89 MIL/uL — ABNORMAL LOW (ref 3.87–5.11)
RDW: 14.3 % (ref 11.5–15.5)
WBC: 6.9 K/uL (ref 4.0–10.5)
nRBC: 0 % (ref 0.0–0.2)

## 2024-06-23 LAB — GLUCOSE, CAPILLARY
Glucose-Capillary: 138 mg/dL — ABNORMAL HIGH (ref 70–99)
Glucose-Capillary: 217 mg/dL — ABNORMAL HIGH (ref 70–99)
Glucose-Capillary: 220 mg/dL — ABNORMAL HIGH (ref 70–99)
Glucose-Capillary: 187 mg/dL — ABNORMAL HIGH (ref 70–99)

## 2024-06-23 LAB — CK: Total CK: 54 U/L (ref 38–234)

## 2024-06-23 MED ORDER — EMPAGLIFLOZIN 10 MG PO TABS
10.0000 mg | ORAL_TABLET | Freq: Every day | ORAL | Status: DC
  Administered 2024-06-23 – 2024-06-26 (×4): 10 mg via ORAL
  Filled 2024-06-23 (×4): qty 1

## 2024-06-23 MED ORDER — SODIUM CHLORIDE 0.9 % IV SOLN
8.0000 mg/kg | Freq: Every day | INTRAVENOUS | Status: DC
  Administered 2024-06-23 – 2024-06-25 (×3): 400 mg via INTRAVENOUS
  Filled 2024-06-23 (×4): qty 8

## 2024-06-23 NOTE — Plan of Care (Signed)
   Problem: Coping: Goal: Ability to adjust to condition or change in health will improve Outcome: Progressing   Problem: Fluid Volume: Goal: Ability to maintain a balanced intake and output will improve Outcome: Progressing   Problem: Metabolic: Goal: Ability to maintain appropriate glucose levels will improve Outcome: Progressing   Problem: Nutritional: Goal: Maintenance of adequate nutrition will improve Outcome: Progressing

## 2024-06-23 NOTE — Progress Notes (Signed)
  Progress Note   Patient: Jill Munoz [id number] DOB: 12/22/1950 DOA: 06/19/2024     3 DOS: the patient was seen and examined on 06/23/2024 at 10:15AM      Brief hospital course: 73 y.o. F with PVD s/p L AKA, DM poorly controlled, HTN presented with non-healing diabetic foot ulcer.   Found to have osteo of the 5th metatarsal head on MRI. Ortho consulted, recommended amputation, patient requested second opinion.  ID consulted.     Assessment and Plan:  Diabetic foot ulcer with osteomyelitis of the fifth metatarsal head and proximal phalanx Patient was admitted on IV antibiotics.  Orthopedic were consulted, recommended ray amputation.  Family asked for second opinion.  I obliged this, and ID was consulted, podiatry performed bedside deep biopsy.  Today, ID recommended against IV antibiotics.  They recommend waiting bedside culture results, tailoring oral antibiotics for outpatient management. - Continue Rocephin  and daptomycin  for now - Follow culture data, plan for transitioning to targeted oral antibiotics and discharge when cultures result     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 moderately reduced ejection fraction Treated with diuretics, now resolved - Continue home spironolactone , furosemide  - Resume Jardiance   - Hold losartan    Peripheral vascular disease Hyperlipidemia Hypertension ABI this admission showed normal DP ABI, but decreased toe brachial index. Blood pressure normal - Continue Crestor , furosemide , spironolactone  - Hold losartan     Chronic kidney disease stage II   Anemia of chronic disease Hemoglobin is stabilized   Diabetes Blood sugars mostly controlled - Continue glargine, sliding scale correction insulin  - Resume Jardiance        Subjective: No new complaints.  Feels that her foot is better with antibiotics.       Physical Exam: BP 123/63 (BP Location: Right Arm)   Pulse 71   Temp 98 F (36.7 C)   Resp 16   Ht 5' 1 (1.549 m)    Wt 47.6 kg   SpO2 96%   BMI 19.84 kg/m   Elderly adult female, lying in bed, makes eye contact RRR, no murmurs, no peripheral edema Respiratory rate normal, lungs clear without rales or wheezes Abdomen soft, no tenderness palpation or guarding The right foot is wrapped, she has a left AKA Speech is slightly dysarthric but at baseline, oriented to person, place, time, short-term memory seems impaired    Data Reviewed: Discussed with infectious disease Basic metabolic panel shows mild hyponatremia, normal renal function CBC shows mild anemia, no leukocytosis     Family Communication: Husband at the bedside  Disposition: Status is: Inpatient 73 year old female with history AKA, vascular disease, and diabetic foot ulcer who presents with osteomyelitis  Amputation was recommended, patient declined, ID have offered targeted therapy, although they have been clear to patient that success of medical therapy alone is exceedingly unlikely        Author: [name], MD 06/23/2024 2:15 PM  For on call review [email or web address].

## 2024-06-23 NOTE — Plan of Care (Signed)
   Problem: Clinical Measurements: Goal: Will remain free from infection Outcome: Progressing   Problem: Pain Managment: Goal: General experience of comfort will improve and/or be controlled Outcome: Progressing   Problem: Safety: Goal: Ability to remain free from injury will improve Outcome: Progressing

## 2024-06-23 NOTE — Care Management Important Message (Signed)
 Important Message  Patient Details  Name: Jill Munoz MRN: [id number] Date of Birth: 04-Oct-1951   Important Message Given:  Yes - Medicare IM     Jon Cruel 06/23/2024, 3:59 PM

## 2024-06-24 DIAGNOSIS — M869 Osteomyelitis, unspecified: Secondary | ICD-10-CM | POA: Diagnosis not present

## 2024-06-24 LAB — CULTURE, BLOOD (ROUTINE X 2)
Culture: NO GROWTH
Culture: NO GROWTH
Special Requests: ADEQUATE

## 2024-06-24 LAB — CBC
HCT: 25.4 % — ABNORMAL LOW (ref 36.0–46.0)
Hemoglobin: 8.3 g/dL — ABNORMAL LOW (ref 12.0–15.0)
MCH: 28.3 pg (ref 26.0–34.0)
MCHC: 32.7 g/dL (ref 30.0–36.0)
MCV: 86.7 fL (ref 80.0–100.0)
Platelets: 349 K/uL (ref 150–400)
RBC: 2.93 MIL/uL — ABNORMAL LOW (ref 3.87–5.11)
RDW: 14.2 % (ref 11.5–15.5)
WBC: 7.2 K/uL (ref 4.0–10.5)
nRBC: 0 % (ref 0.0–0.2)

## 2024-06-24 LAB — GLUCOSE, CAPILLARY
Glucose-Capillary: 161 mg/dL — ABNORMAL HIGH (ref 70–99)
Glucose-Capillary: 164 mg/dL — ABNORMAL HIGH (ref 70–99)
Glucose-Capillary: 223 mg/dL — ABNORMAL HIGH (ref 70–99)
Glucose-Capillary: 219 mg/dL — ABNORMAL HIGH (ref 70–99)

## 2024-06-24 LAB — BASIC METABOLIC PANEL WITH GFR
Anion gap: 12 (ref 5–15)
BUN: 28 mg/dL — ABNORMAL HIGH (ref 8–23)
CO2: 27 mmol/L (ref 22–32)
Calcium: 9.1 mg/dL (ref 8.9–10.3)
Chloride: 97 mmol/L — ABNORMAL LOW (ref 98–111)
Creatinine, Ser: 1.05 mg/dL — ABNORMAL HIGH (ref 0.44–1.00)
GFR, Estimated: 57 mL/min — ABNORMAL LOW (ref >60)
Glucose, Bld: 183 mg/dL — ABNORMAL HIGH (ref 70–99)
Potassium: 3.8 mmol/L (ref 3.5–5.1)
Sodium: 136 mmol/L (ref 135–145)

## 2024-06-24 NOTE — TOC Initial Note (Signed)
 Transition of Care [facility]) - Initial/Assessment Note    Patient Details  Name: Jill Munoz MRN: [id number] Date of Birth: Dec 25, 1950  Transition of Care [facility]) CM/SW Contact:    Bridget Cordella Simmonds, LCSW Phone Number: 06/24/2024, 11:08 AM  Clinical Narrative:     CSW requested to speak with pt and husband, did speak with them for initial assessment.  Pt oriented x4 but husband Lucina provides most information.  Husband reports they live together but have no support beyond each other.  No family in the area, no children, no friend support.  Husband reports pt needing significant HH aide type support at home with all ADLs.  Both asking if this can be arranged.  Discussed that there are no PT recommendations at this time.  Pt has only medicare, discussed that medicare may pay for temporary and limited home services but does not cover extended periods of time in the home.  Discussed their ability to private pay for [facility] aide services and husband says they cannot afford.   RNCM aware. ICM will continue to follow.                 Barriers to Discharge: Continued Medical Work up   Patient Goals and CMS Choice Patient states their goals for this hospitalization and ongoing recovery are:: get home          Expected Discharge Plan and Services     Post Acute Care Choice:  (TBD) Living arrangements for the past 2 months: Single Family Home                                      Prior Living Arrangements/Services Living arrangements for the past 2 months: Single Family Home Lives with:: Spouse Patient language and need for interpreter reviewed:: Yes Do you feel safe going back to the place where you live?: Yes (requesting HH aide support)      Need for Family Participation in Patient Care: Yes (Comment) Care giver support system in place?: Yes (comment) (only husband) Current home services: Other (comment) (none) Criminal Activity/Legal Involvement Pertinent to Current  Situation/Hospitalization: No - Comment as needed  Activities of Daily Living      Permission Sought/Granted   Permission granted to share information with : Yes, Verbal Permission Granted  Share Information with NAME: husband Asghar           Emotional Assessment Appearance:: Appears older than stated age Attitude/Demeanor/Rapport: Engaged Affect (typically observed): Anxious Orientation: : Oriented to Self, Oriented to Place, Oriented to  Time, Oriented to Situation      Admission diagnosis:  Osteomyelitis (HCC) [M86.9] AKI (acute kidney injury) (HCC) [N17.9] Osteomyelitis of foot, unspecified laterality, unspecified type [facility]) [M86.9] Patient Active Problem List   Diagnosis Date Noted   Osteomyelitis of fifth toe of right foot (HCC) 06/20/2024   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 (HFmrEF, 41-49%) (HCC) 02/09/2024   Acute on chronic anemia 02/09/2024   Leukopenia 02/09/2024   Non-insulin  dependent type 2 diabetes mellitus (HCC) 02/09/2024   Acute respiratory failure with hypoxia (HCC) 02/09/2024   GERD without esophagitis 08/22/2022   Amputee, above knee, left (HCC) 05/11/2022   Anemia of chronic disease 05/11/2022   Constipation 05/11/2022   Kyphosis of thoracic region 05/11/2022   Blurry vision, bilateral 05/11/2022   Hypertension associated with diabetes (HCC) 05/11/2022   Closed compression fracture of thoracic vertebra (HCC)  05/11/2022   Weakness    Hypokalemia 04/10/2022   Bacteremia due to Streptococcus Pyogenes 04/03/2022   Type 2 diabetes mellitus with hyperglycemia (HCC) 04/03/2022   Beta-hemolytic group A streptococcal sepsis (HCC)    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 AT [address] [facility] [address] [place] [address] Phone:  [phone] Fax: [phone]     Social Drivers of Health (SDOH) Social History: SDOH Screenings   Food Insecurity: No Food Insecurity (06/20/2024)  Housing: Low Risk  (06/20/2024)  Transportation Needs: No Transportation Needs (06/20/2024)  Utilities: Not At Risk (06/20/2024)  Alcohol  Screen: Low Risk  (10/14/2023)  Depression (PHQ2-9): Low Risk  (10/14/2023)  Financial Resource Strain: Low Risk  (10/14/2023)  Physical Activity: Inactive (10/14/2023)  Social Connections: Moderately Isolated (06/20/2024)  Stress: No Stress Concern Present (10/14/2023)  Tobacco Use: Low Risk  (06/19/2024)  Health Literacy: Adequate Health Literacy (10/14/2023)   SDOH Interventions:     Readmission Risk Interventions     No data to display

## 2024-06-24 NOTE — Progress Notes (Signed)
 PROGRESS NOTE    Jill Munoz  FMW:[id number] DOB: [birth year] DOA: 06/19/2024 PCP: Ransom Other, MD   Brief Narrative:  This 73 y.o. Female with PVD s/p Lt. AKA, DM poorly controlled, HTN presented with non-healing diabetic foot ulcer.  She is found to have osteomyelitis of the 5th metatarsal head on MRI. Ortho consulted, recommended amputation, but patient requested second opinion.  ID consulted. Continued on IV antibiotics.  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  Orthopedic were consulted, recommended ray amputation.  Family asked for second opinion. ID was consulted, podiatry performed bedside deep biopsy.  06/23/24 : ID recommended against IV antibiotics. Awaiting bedside culture results, tailoring oral antibiotics for outpatient management. Continue Rocephin  and daptomycin  for now Follow culture data, plan for transitioning to targeted oral antibiotics and discharge when cultures result.   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 moderately reduced ejection fraction: She was treated with diuretics, now resolved. Continue home spironolactone , furosemide . Resume Jardiance   Hold losartan .   Peripheral vascular disease: Hyperlipidemia: Hypertension ABI this admission showed normal DP ABI, but decreased toe brachial index. Blood pressure normal Continue Crestor , furosemide , spironolactone  Hold losartan     Chronic kidney disease stage II: Serum creatinine at baseline.   Anemia of chronic disease Hemoglobin is stabilized. No signs of bleeding   Diabetes Mellitus II: Blood sugars mostly controlled. Continue glargine, sliding scale correction insulin  Resume Jardiance      DVT prophylaxis: Heparin  IV Code Status: Full code Family Communication: Husband at bed side Disposition Plan:     Status is: Inpatient Remains inpatient appropriate because: Severity of illness.    Consultants:  Orthopedics Infectious diseases  Procedures:  Antimicrobials:  Anti-infectives (From admission, onward)    Start     Dose/Rate Route Frequency Ordered Stop   06/23/24 1400  DAPTOmycin  (CUBICIN ) 400 mg in sodium chloride  0.9 % IVPB        8 mg/kg  47.6 kg 116 mL/hr over 30 Minutes Intravenous Daily 06/23/24 0854     06/22/24 1200  DAPTOmycin  (CUBICIN ) 400 mg in sodium chloride  0.9 % IVPB  Status:  Discontinued        8 mg/kg  47.6 kg 116 mL/hr over 30 Minutes Intravenous Every 48 hours 06/22/24 0923 06/23/24 0854   06/22/24 1030  cefTRIAXone  (ROCEPHIN ) 2 g in sodium chloride  0.9 % 100 mL IVPB        2 g 200 mL/hr over 30 Minutes Intravenous Every 24 hours 06/22/24 0923     06/20/24 0600  Ampicillin -Sulbactam (UNASYN ) 3 g in sodium chloride  0.9 % 100 mL IVPB  Status:  Discontinued        3 g 200 mL/hr over 30 Minutes Intravenous Every 8 hours 06/20/24 0026 06/20/24 0220   06/20/24 0600  piperacillin -tazobactam (ZOSYN ) IVPB 3.375 g  Status:  Discontinued        3.375 g 12.5 mL/hr over 240 Minutes Intravenous Every 8 hours 06/20/24 0220 06/21/24 1012   06/20/24 0056  vancomycin  (VANCOREADY) IVPB 1250 mg/250 mL  Status:  Discontinued        1,250 mg 166.7 mL/hr over 90 Minutes Intravenous Every 48 hours 06/20/24 0056 06/21/24 1012   06/19/24 2200  vancomycin  (VANCOREADY) IVPB 750 mg/150 mL  Status:  Discontinued        750 mg 150 mL/hr over 60 Minutes Intravenous  Once 06/19/24 2145 06/20/24 0056   06/19/24 2145  Ampicillin -Sulbactam (UNASYN ) 3 g in sodium chloride  0.9 % 100 mL IVPB       Placed in And Linked Group   3 g 200 mL/hr over 30 Minutes Intravenous  Once 06/19/24 2140 06/20/24 0700   06/19/24 2145  vancomycin  (VANCOCIN ) IVPB 1000 mg/200 mL premix  Status:  Discontinued       Placed in And Linked Group   1,000 mg 200 mL/hr over 60 Minutes Intravenous  Once  06/19/24 2140 06/19/24 2145       Subjective: Patient was seen and examined at bedside.  Overnight events noted. Patient appears pleasant and cheerful,  states she is doing well.   She does not want any intervention on her right foot.  Objective: Vitals:   06/23/24 1718 06/23/24 1955 06/24/24 0349 06/24/24 0747  BP: (!) 126/51 (!) 152/64 (!) 144/60 (!) 129/52  Pulse: 76 79 73 74  Resp: 15 16 16 16   Temp: 98.6 F (37 C) 98.2 F (36.8 C) 98 F (36.7 C) 97.6 F (36.4 C)  TempSrc:   Axillary   SpO2: 97% 96% 97% 98%  Weight:      Height:        Intake/Output Summary (Last 24 hours) at 06/24/2024 1342 Last data filed at 06/24/2024 1041 Gross per 24 hour  Intake 285.89 ml  Output 700 ml  Net -414.11 ml   Filed Weights   06/19/24 1728  Weight: 47.6 kg    Examination:  General exam: Appears calm and comfortable, deconditioned, pleasant Respiratory system: Clear to auscultation. Respiratory effort normal.  RR 15 Cardiovascular system: S1 & S2 heard, RRR. No JVD, murmurs, rubs, gallops or clicks.  Gastrointestinal system: Abdomen is non distended, soft and non tender. . Normal bowel sounds heard. Central nervous system: Alert and oriented x 3. No focal neurological deficits. Extremities: Left AKA.  Right foot covered in dressing. Skin: No rashes, lesions or ulcers Psychiatry: Judgement and insight appear normal. Mood & affect appropriate.     Data Reviewed: I have personally reviewed following labs and imaging studies  CBC: Recent Labs  Lab 06/19/24 1739 06/19/24 1753 06/20/24 0633 06/21/24 0434 06/22/24 0717 06/23/24 0448 06/24/24 0424  WBC 6.1  --  7.8 8.1 7.4 6.9 7.2  NEUTROABS 4.3  --   --   --   --   --   --   HGB 9.2*   < > 8.0* 7.8* 7.8* 8.3* 8.3*  HCT 28.1*   < > 24.8* 23.8* 23.6* 24.6* 25.4*  MCV 87.0  --  85.8 85.0 85.5 85.1 86.7  PLT 426*  --  376 370 333 347 349   < > = values in this interval not displayed.   Basic Metabolic Panel: Recent Labs   Lab 06/20/24 0633 06/21/24 0434 06/22/24 0717 06/23/24 0448 06/24/24 0424  NA 134* 134* 135 134* 136  K 4.1 3.7 4.0 3.5 3.8  CL 100 99 99 99 97*  CO2 22 24 24 24 27   GLUCOSE 216* 87 79 173* 183*  BUN 20 24* 26* 28* 28*  CREATININE 1.05* 1.38* 1.35* 1.09* 1.05*  CALCIUM  8.9 9.0 8.9 8.8* 9.1  MG 1.6*  --   --   --   --   PHOS 2.7  --   --   --   --    GFR: Estimated Creatinine Clearance: 36.9 mL/min (A) (by C-G formula based on SCr of 1.05 mg/dL (H)). Liver Function Tests: Recent  Labs  Lab 06/19/24 1739 06/23/24 0448  AST 19 18  ALT 19 15  ALKPHOS 132* 104  BILITOT 0.5 0.4  PROT 7.9 6.7  ALBUMIN  3.3* 2.7*   No results for input(s): LIPASE, AMYLASE in the last 168 hours. No results for input(s): AMMONIA in the last 168 hours. Coagulation Profile: No results for input(s): INR, PROTIME in the last 168 hours. Cardiac Enzymes: Recent Labs  Lab 06/23/24 0448  CKTOTAL 54   BNP (last 3 results) No results for input(s): PROBNP in the last 8760 hours. HbA1C: No results for input(s): HGBA1C in the last 72 hours. CBG: Recent Labs  Lab 06/23/24 1155 06/23/24 1614 06/23/24 2151 06/24/24 0617 06/24/24 1218  GLUCAP 217* 220* 187* 161* 164*   Lipid Profile: No results for input(s): CHOL, HDL, LDLCALC, TRIG, CHOLHDL, LDLDIRECT in the last 72 hours. Thyroid Function Tests: No results for input(s): TSH, T4TOTAL, FREET4, T3FREE, THYROIDAB in the last 72 hours. Anemia Panel: No results for input(s): VITAMINB12, FOLATE, FERRITIN, TIBC, IRON , RETICCTPCT in the last 72 hours. Sepsis Labs: Recent Labs  Lab 06/19/24 1753 06/19/24 2224  LATICACIDVEN 1.0 1.0    Recent Results (from the past 240 hours)  Blood culture (routine x 2)     Status: None   Collection Time: 06/19/24  5:39 PM   Specimen: BLOOD RIGHT HAND  Result Value Ref Range Status   Specimen Description BLOOD RIGHT HAND  Final   Special Requests   Final     BOTTLES DRAWN AEROBIC AND ANAEROBIC Blood Culture results may not be optimal due to an inadequate volume of blood received in culture bottles   Culture   Final    NO GROWTH 5 DAYS Performed at [facility], [address] [place], [place] [address]    Report Status 06/24/2024 FINAL  Final  Blood culture (routine x 2)     Status: None   Collection Time: 06/19/24 10:24 PM   Specimen: BLOOD  Result Value Ref Range Status   Specimen Description BLOOD SITE NOT SPECIFIED  Final   Special Requests   Final    BOTTLES DRAWN AEROBIC AND ANAEROBIC Blood Culture adequate volume   Culture   Final    NO GROWTH 5 DAYS Performed at [facility], [address], [place], [place] [address]    Report Status 06/24/2024 FINAL  Final  Aerobic/Anaerobic Culture w Gram Stain (surgical/deep wound)     Status: None (Preliminary result)   Collection Time: 06/22/24  8:42 AM   Specimen: Path Tissue  Result Value Ref Range Status   Specimen Description TISSUE  Final   Special Requests RIGHT FOOT  Final   Gram Stain   Final    ABUNDANT WBC PRESENT, PREDOMINANTLY PMN ABUNDANT GRAM POSITIVE COCCI MODERATE GRAM POSITIVE RODS MODERATE GRAM NEGATIVE RODS    Culture   Final    MODERATE ENTEROCOCCUS FAECALIS FEW STAPHYLOCOCCUS EPIDERMIDIS SUSCEPTIBILITIES TO FOLLOW Performed at [facility], [address] [place], [place] [address]    Report Status PENDING  Incomplete  Aerobic Culture w Gram Stain (superficial specimen)     Status: None (Preliminary result)   Collection Time: 06/22/24  8:43 AM   Specimen: Wound  Result Value Ref Range Status   Specimen Description WOUND  Final   Special Requests RIGHT FOOT  Final   Gram Stain   Final    MODERATE WBC PRESENT,BOTH PMN AND MONONUCLEAR MODERATE GRAM POSITIVE COCCI Performed at [facility], [address] [place], [place] [address]    Culture FEW ENTEROCOCCUS FAECALIS  Final   Report Status PENDING  Incomplete   Organism ID,  Bacteria ENTEROCOCCUS FAECALIS  Final      Susceptibility   Enterococcus faecalis - MIC*    AMPICILLIN  <=2 SENSITIVE Sensitive     VANCOMYCIN  2 SENSITIVE Sensitive     GENTAMICIN SYNERGY SENSITIVE Sensitive     * FEW ENTEROCOCCUS FAECALIS    Radiology Studies: No results found.  Scheduled Meds:  dorzolamide -timolol   1 drop Both Eyes BID   empagliflozin   10 mg Oral Daily   furosemide   20 mg Oral Daily   heparin   5,000 Units Subcutaneous Q12H   insulin  aspart  0-6 Units Subcutaneous TID WC   insulin  glargine  6 Units Subcutaneous QHS   nystatin   1 Application Topical TID   pantoprazole   40 mg Oral BID   rosuvastatin   20 mg Oral QHS   sodium chloride  flush  3 mL Intravenous Q12H   spironolactone   25 mg Oral Daily   Continuous Infusions:  cefTRIAXone  (ROCEPHIN )  IV 2 g (06/24/24 1331)   DAPTOmycin  Stopped (06/23/24 1257)     LOS: 4 days    Time spent: 50 mins    Darcel Dawley, MD [facility]   If 7PM-7AM, please contact night-coverage

## 2024-06-24 NOTE — Progress Notes (Signed)
 [facility]  Date of Admission:  06/19/2024   Total days of inpatient antibiotics 4  Principal Problem:   Osteomyelitis of fifth toe of right foot [facility])          Assessment: 73 year old female with history of diabetes mellitus, PAD, status post left AKA, hypertension, chronic right foot ulcer now progressed to osteomyelitis with abscess. #Right fifth metatarsal osteomyelitis, MTP joint abscess - MRI on 9/5 showed findings consistent fifth metatarsal osteomyelitis, 1X1X 1.6 cm fluid collection along the plantar lateral aspect of fifth MTP joint concern for small abscess. -patient had been seen by orthopedics Dr. Harden who had recommended ABIs of right lower extremity, Doxy x 1 week follow-up in office as patient was not interested in surgical intervention. -Then podiatry was engaged who recommended amputation of fifth ray, patient is not amenable to that at that time. - ID was engaged to antibiotics were continued with daptomycin  and ceftriaxone  recommended bone biopsy - Given the patient has been on multiple antibiotics.  Podiatry with no plans for OR, given patient declined amputation.  Tissue biopsy obtained at bedside Recommendations: -Continue Dapto and ceftriaxone  - Follow tissue cultures - Discussed with patient that antibiotics alone will unlikely be curative given abscess.  Per podiatry note there was fibropurulent drainage from wound that was malodorous as well.  Will ideally like to do oral antibiotics to bridge to either podiatry or orthopedic appointment.  Patient and family at bedside seem amenable to this plan.   Microbiology:   Antibiotics: Dapto + ctx 9/8-p Cultures: Blood 9/5 Urine  Other   SUBJECTIVE: Resting in bed, husband at bedside Interval: Afebrile overnight. Wbc 7.2 k  Review of Systems: Review of Systems  All other systems reviewed and are negative.    Scheduled Meds:  dorzolamide -timolol   1 drop Both Eyes BID    empagliflozin   10 mg Oral Daily   furosemide   20 mg Oral Daily   heparin   5,000 Units Subcutaneous Q12H   insulin  aspart  0-6 Units Subcutaneous TID WC   insulin  glargine  6 Units Subcutaneous QHS   nystatin   1 Application Topical TID   pantoprazole   40 mg Oral BID   rosuvastatin   20 mg Oral QHS   sodium chloride  flush  3 mL Intravenous Q12H   spironolactone   25 mg Oral Daily   Continuous Infusions:  cefTRIAXone  (ROCEPHIN )  IV 2 g (06/23/24 0911)   DAPTOmycin  400 mg (06/23/24 1227)   PRN Meds:.acetaminophen , albuterol , melatonin, ondansetron  (ZOFRAN ) IV, polyethylene glycol, traMADol  Allergies  Allergen Reactions   Other     States she is allergic to abx but does not know name    Amoxicillin  Diarrhea    OBJECTIVE: Vitals:   06/23/24 0755 06/23/24 1718 06/23/24 1955 06/24/24 0349  BP: 123/63 (!) 126/51 (!) 152/64 (!) 144/60  Pulse: 71 76 79 73  Resp: 16 15 16 16   Temp: 98 F (36.7 C) 98.6 F (37 C) 98.2 F (36.8 C) 98 F (36.7 C)  TempSrc:    Axillary  SpO2: 96% 97% 96% 97%  Weight:      Height:       Body mass index is 19.84 kg/m.  Physical Exam Constitutional:      Appearance: Normal appearance.  HENT:     Head: Normocephalic and atraumatic.     Right Ear: Tympanic membrane normal.     Left Ear: Tympanic membrane normal.     Nose: Nose normal.  Mouth/Throat:     Mouth: Mucous membranes are moist.  Eyes:     Extraocular Movements: Extraocular movements intact.     Conjunctiva/sclera: Conjunctivae normal.     Pupils: Pupils are equal, round, and reactive to light.  Cardiovascular:     Rate and Rhythm: Normal rate and regular rhythm.     Heart sounds: No murmur heard.    No friction rub. No gallop.  Pulmonary:     Effort: Pulmonary effort is normal.     Breath sounds: Normal breath sounds.  Abdominal:     General: Abdomen is flat.     Palpations: Abdomen is soft.  Musculoskeletal:     Comments: Foot bandaged  Skin:    General: Skin is warm and  dry.  Neurological:     General: No focal deficit present.     Mental Status: She is alert and oriented to person, place, and time.  Psychiatric:        Mood and Affect: Mood normal.       Lab Results Lab Results  Component Value Date   WBC 7.2 06/24/2024   HGB 8.3 (L) 06/24/2024   HCT 25.4 (L) 06/24/2024   MCV 86.7 06/24/2024   PLT 349 06/24/2024    Lab Results  Component Value Date   CREATININE 1.09 (H) 06/23/2024   BUN 28 (H) 06/23/2024   NA 134 (L) 06/23/2024   K 3.5 06/23/2024   CL 99 06/23/2024   CO2 24 06/23/2024    Lab Results  Component Value Date   ALT 15 06/23/2024   AST 18 06/23/2024   ALKPHOS 104 06/23/2024   BILITOT 0.4 06/23/2024        Loney Stank, MD [facility] 06/24/2024, 6:11 AM Evaluation of this patient requires complex antimicrobial therapy evaluation and counseling + isolation needs for disease transmission risk assessment and mitigation

## 2024-06-25 DIAGNOSIS — M869 Osteomyelitis, unspecified: Secondary | ICD-10-CM | POA: Diagnosis not present

## 2024-06-25 LAB — BASIC METABOLIC PANEL WITH GFR
Anion gap: 15 (ref 5–15)
BUN: 28 mg/dL — ABNORMAL HIGH (ref 8–23)
CO2: 25 mmol/L (ref 22–32)
Calcium: 9.1 mg/dL (ref 8.9–10.3)
Chloride: 96 mmol/L — ABNORMAL LOW (ref 98–111)
Creatinine, Ser: 0.99 mg/dL (ref 0.44–1.00)
GFR, Estimated: >60 mL/min (ref >60)
Glucose, Bld: 151 mg/dL — ABNORMAL HIGH (ref 70–99)
Potassium: 3.7 mmol/L (ref 3.5–5.1)
Sodium: 136 mmol/L (ref 135–145)

## 2024-06-25 LAB — GLUCOSE, CAPILLARY
Glucose-Capillary: 136 mg/dL — ABNORMAL HIGH (ref 70–99)
Glucose-Capillary: 163 mg/dL — ABNORMAL HIGH (ref 70–99)
Glucose-Capillary: 204 mg/dL — ABNORMAL HIGH (ref 70–99)
Glucose-Capillary: 193 mg/dL — ABNORMAL HIGH (ref 70–99)

## 2024-06-25 LAB — AEROBIC CULTURE W GRAM STAIN (SUPERFICIAL SPECIMEN)

## 2024-06-25 LAB — CBC
HCT: 26.4 % — ABNORMAL LOW (ref 36.0–46.0)
Hemoglobin: 8.7 g/dL — ABNORMAL LOW (ref 12.0–15.0)
MCH: 28.3 pg (ref 26.0–34.0)
MCHC: 33 g/dL (ref 30.0–36.0)
MCV: 86 fL (ref 80.0–100.0)
Platelets: 364 K/uL (ref 150–400)
RBC: 3.07 MIL/uL — ABNORMAL LOW (ref 3.87–5.11)
RDW: 14.1 % (ref 11.5–15.5)
WBC: 7.7 K/uL (ref 4.0–10.5)
nRBC: 0 % (ref 0.0–0.2)

## 2024-06-25 LAB — MAGNESIUM: Magnesium: 1.8 mg/dL (ref 1.7–2.4)

## 2024-06-25 LAB — PHOSPHORUS: Phosphorus: 4.5 mg/dL (ref 2.5–4.6)

## 2024-06-25 MED ORDER — FLEET ENEMA RE ENEM
1.0000 | ENEMA | Freq: Once | RECTAL | Status: AC
  Administered 2024-06-25: 1 via RECTAL
  Filled 2024-06-25: qty 1

## 2024-06-25 MED ORDER — MICONAZOLE NITRATE 2 % EX CREA
TOPICAL_CREAM | CUTANEOUS | Status: DC | PRN
  Filled 2024-06-25: qty 28.4

## 2024-06-25 NOTE — Progress Notes (Signed)
 PROGRESS NOTE    Jill Munoz  FMW:[id number] DOB: 25-Jan-1951 DOA: 06/19/2024 PCP: Ransom Other, MD   Brief Narrative:  This 73 y.o. Female with PVD s/p Lt. AKA, DM poorly controlled, HTN presented with non-healing diabetic foot ulcer.  She is found to have osteomyelitis of the 5th metatarsal head on MRI. Ortho consulted, recommended amputation, but patient requested second opinion.  ID consulted. Continued on IV antibiotics.  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  Orthopedic were consulted, recommended ray amputation.  Family asked for second opinion. ID was consulted, podiatry performed bedside deep biopsy.  06/23/24 : ID recommended against IV antibiotics. Awaiting bedside culture results, tailoring oral antibiotics for outpatient management. Continue Rocephin  and daptomycin  for now. Follow culture data, plan for transitioning to targeted oral antibiotics and discharge when cultures result.   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 moderately reduced ejection fraction: She was treated with diuretics, now resolved. Continue home spironolactone , furosemide . Resume Jardiance   Hold losartan .   Peripheral vascular disease: Hyperlipidemia: Hypertension ABI this admission showed normal DP ABI, but decreased toe brachial index. Blood pressure normal Continue Crestor , furosemide , spironolactone  Hold losartan     Chronic kidney disease stage II: Serum creatinine at baseline.   Anemia of chronic disease Hemoglobin is stabilized. No signs of bleeding   Diabetes Mellitus II: Blood sugars mostly controlled. Continue glargine, sliding scale correction insulin  Resume Jardiance      DVT prophylaxis: Heparin  sq Code Status: Full code Family Communication: Husband at bed  side. Disposition Plan:    Status is: Inpatient Remains inpatient appropriate because: Severity of illness.    Consultants:  Orthopedics Infectious diseases  Procedures:  Antimicrobials:  Anti-infectives (From admission, onward)    Start     Dose/Rate Route Frequency Ordered Stop   06/23/24 1400  DAPTOmycin  (CUBICIN ) 400 mg in sodium chloride  0.9 % IVPB        8 mg/kg  47.6 kg 116 mL/hr over 30 Minutes Intravenous Daily 06/23/24 0854     06/22/24 1200  DAPTOmycin  (CUBICIN ) 400 mg in sodium chloride  0.9 % IVPB  Status:  Discontinued        8 mg/kg  47.6 kg 116 mL/hr over 30 Minutes Intravenous Every 48 hours 06/22/24 0923 06/23/24 0854   06/22/24 1030  cefTRIAXone  (ROCEPHIN ) 2 g in sodium chloride  0.9 % 100 mL IVPB        2 g 200 mL/hr over 30 Minutes Intravenous Every 24 hours 06/22/24 0923     06/20/24 0600  Ampicillin -Sulbactam (UNASYN ) 3 g in sodium chloride  0.9 % 100 mL IVPB  Status:  Discontinued        3 g 200 mL/hr over 30 Minutes Intravenous Every 8 hours 06/20/24 0026 06/20/24 0220   06/20/24 0600  piperacillin -tazobactam (ZOSYN ) IVPB 3.375 g  Status:  Discontinued        3.375 g 12.5 mL/hr over 240 Minutes Intravenous Every 8 hours 06/20/24 0220 06/21/24 1012   06/20/24 0056  vancomycin  (VANCOREADY) IVPB 1250 mg/250 mL  Status:  Discontinued        1,250 mg 166.7 mL/hr over 90 Minutes Intravenous Every 48 hours 06/20/24 0056 06/21/24 1012   06/19/24 2200  vancomycin  (VANCOREADY) IVPB 750 mg/150 mL  Status:  Discontinued        750 mg 150 mL/hr over 60 Minutes  Intravenous  Once 06/19/24 2145 06/20/24 0056   06/19/24 2145  Ampicillin -Sulbactam (UNASYN ) 3 g in sodium chloride  0.9 % 100 mL IVPB       Placed in And Linked Group   3 g 200 mL/hr over 30 Minutes Intravenous  Once 06/19/24 2140 06/20/24 0700   06/19/24 2145  vancomycin  (VANCOCIN ) IVPB 1000 mg/200 mL premix  Status:  Discontinued       Placed in And Linked Group   1,000 mg 200 mL/hr over 60 Minutes  Intravenous  Once 06/19/24 2140 06/19/24 2145       Subjective: Patient was seen and examined at bedside.  Overnight events noted. Patient appears pleasant and cheerful,  states she is doing pretty well.   She does not want any intervention on her right foot.  Objective: Vitals:   06/24/24 1526 06/24/24 1956 06/25/24 0400 06/25/24 0812  BP: (!) 122/54 128/62 (!) 142/58 (!) 109/52  Pulse: 74 75 67 79  Resp: 16 17 16 18   Temp: 97.7 F (36.5 C) 97.7 F (36.5 C) 97.6 F (36.4 C) 97.7 F (36.5 C)  TempSrc:   Oral Oral  SpO2: 96% 99% 99% 96%  Weight:      Height:        Intake/Output Summary (Last 24 hours) at 06/25/2024 1450 Last data filed at 06/25/2024 1239 Gross per 24 hour  Intake 518 ml  Output 1000 ml  Net -482 ml   Filed Weights   06/19/24 1728  Weight: 47.6 kg    Examination:  General exam: Appears calm and comfortable, deconditioned, pleasant Respiratory system: CTA Bilaterally. Respiratory effort normal.  RR 15 Cardiovascular system: S1 & S2 heard, RRR. No JVD, murmurs, rubs, gallops or clicks.  Gastrointestinal system: Abdomen is non distended, soft and non tender. . Normal bowel sounds heard. Central nervous system: Alert and oriented x 3. No focal neurological deficits. Extremities: Left AKA.  Right foot covered in dressing. Skin: No rashes, lesions or ulcers Psychiatry: Judgement and insight appear normal. Mood & affect appropriate.     Data Reviewed: I have personally reviewed following labs and imaging studies  CBC: Recent Labs  Lab 06/19/24 1739 06/19/24 1753 06/21/24 0434 06/22/24 0717 06/23/24 0448 06/24/24 0424 06/25/24 0545  WBC 6.1   < > 8.1 7.4 6.9 7.2 7.7  NEUTROABS 4.3  --   --   --   --   --   --   HGB 9.2*   < > 7.8* 7.8* 8.3* 8.3* 8.7*  HCT 28.1*   < > 23.8* 23.6* 24.6* 25.4* 26.4*  MCV 87.0   < > 85.0 85.5 85.1 86.7 86.0  PLT 426*   < > 370 333 347 349 364   < > = values in this interval not displayed.   Basic Metabolic  Panel: Recent Labs  Lab 06/20/24 0633 06/21/24 0434 06/22/24 0717 06/23/24 0448 06/24/24 0424 06/25/24 0545  NA 134* 134* 135 134* 136 136  K 4.1 3.7 4.0 3.5 3.8 3.7  CL 100 99 99 99 97* 96*  CO2 22 24 24 24 27 25   GLUCOSE 216* 87 79 173* 183* 151*  BUN 20 24* 26* 28* 28* 28*  CREATININE 1.05* 1.38* 1.35* 1.09* 1.05* 0.99  CALCIUM  8.9 9.0 8.9 8.8* 9.1 9.1  MG 1.6*  --   --   --   --  1.8  PHOS 2.7  --   --   --   --  4.5   GFR: Estimated Creatinine Clearance: 39.2  mL/min (by C-G formula based on SCr of 0.99 mg/dL). Liver Function Tests: Recent Labs  Lab 06/19/24 1739 06/23/24 0448  AST 19 18  ALT 19 15  ALKPHOS 132* 104  BILITOT 0.5 0.4  PROT 7.9 6.7  ALBUMIN  3.3* 2.7*   No results for input(s): LIPASE, AMYLASE in the last 168 hours. No results for input(s): AMMONIA in the last 168 hours. Coagulation Profile: No results for input(s): INR, PROTIME in the last 168 hours. Cardiac Enzymes: Recent Labs  Lab 06/23/24 0448  CKTOTAL 54   BNP (last 3 results) No results for input(s): PROBNP in the last 8760 hours. HbA1C: No results for input(s): HGBA1C in the last 72 hours. CBG: Recent Labs  Lab 06/24/24 1218 06/24/24 1528 06/24/24 2158 06/25/24 0610 06/25/24 1128  GLUCAP 164* 223* 219* 136* 163*   Lipid Profile: No results for input(s): CHOL, HDL, LDLCALC, TRIG, CHOLHDL, LDLDIRECT in the last 72 hours. Thyroid Function Tests: No results for input(s): TSH, T4TOTAL, FREET4, T3FREE, THYROIDAB in the last 72 hours. Anemia Panel: No results for input(s): VITAMINB12, FOLATE, FERRITIN, TIBC, IRON , RETICCTPCT in the last 72 hours. Sepsis Labs: Recent Labs  Lab 06/19/24 1753 06/19/24 2224  LATICACIDVEN 1.0 1.0    Recent Results (from the past 240 hours)  Blood culture (routine x 2)     Status: None   Collection Time: 06/19/24  5:39 PM   Specimen: BLOOD RIGHT HAND  Result Value Ref Range Status   Specimen  Description BLOOD RIGHT HAND  Final   Special Requests   Final    BOTTLES DRAWN AEROBIC AND ANAEROBIC Blood Culture results may not be optimal due to an inadequate volume of blood received in culture bottles   Culture   Final    NO GROWTH 5 DAYS Performed at [facility], [address], [place], [place] [address]    Report Status 06/24/2024 FINAL  Final  Blood culture (routine x 2)     Status: None   Collection Time: 06/19/24 10:24 PM   Specimen: BLOOD  Result Value Ref Range Status   Specimen Description BLOOD SITE NOT SPECIFIED  Final   Special Requests   Final    BOTTLES DRAWN AEROBIC AND ANAEROBIC Blood Culture adequate volume   Culture   Final    NO GROWTH 5 DAYS Performed at [facility], [address] [place], [place] [address]    Report Status 06/24/2024 FINAL  Final  Aerobic/Anaerobic Culture w Gram Stain (surgical/deep wound)     Status: None (Preliminary result)   Collection Time: 06/22/24  8:42 AM   Specimen: Path Tissue  Result Value Ref Range Status   Specimen Description TISSUE  Final   Special Requests RIGHT FOOT  Final   Gram Stain   Final    ABUNDANT WBC PRESENT, PREDOMINANTLY PMN ABUNDANT GRAM POSITIVE COCCI MODERATE GRAM POSITIVE RODS MODERATE GRAM NEGATIVE RODS    Culture   Final    MODERATE ENTEROCOCCUS FAECALIS FEW STAPHYLOCOCCUS EPIDERMIDIS ABUNDANT DIPHTHEROIDS(CORYNEBACTERIUM SPECIES) Standardized susceptibility testing for this organism is not available. HOLDING FOR POSSIBLE ANAEROBE Performed at [facility], [address] [place], [place] [address]    Report Status PENDING  Incomplete   Organism ID, Bacteria ENTEROCOCCUS FAECALIS  Final   Organism ID, Bacteria STAPHYLOCOCCUS EPIDERMIDIS  Final      Susceptibility   Enterococcus faecalis - MIC*    AMPICILLIN  <=2 SENSITIVE Sensitive     VANCOMYCIN  2 SENSITIVE Sensitive  GENTAMICIN SYNERGY SENSITIVE Sensitive     * MODERATE ENTEROCOCCUS FAECALIS   Staphylococcus  epidermidis - MIC*    CIPROFLOXACIN <=0.5 SENSITIVE Sensitive     ERYTHROMYCIN >=8 RESISTANT Resistant     GENTAMICIN <=0.5 SENSITIVE Sensitive     OXACILLIN <=0.25 SENSITIVE Sensitive     TETRACYCLINE 2 SENSITIVE Sensitive     VANCOMYCIN  2 SENSITIVE Sensitive     TRIMETH/SULFA 160 RESISTANT Resistant     CLINDAMYCIN >=8 RESISTANT Resistant     RIFAMPIN <=0.5 SENSITIVE Sensitive     Inducible Clindamycin NEGATIVE Sensitive     * FEW STAPHYLOCOCCUS EPIDERMIDIS  Aerobic Culture w Gram Stain (superficial specimen)     Status: None   Collection Time: 06/22/24  8:43 AM   Specimen: Wound  Result Value Ref Range Status   Specimen Description WOUND  Final   Special Requests RIGHT FOOT  Final   Gram Stain   Final    MODERATE WBC PRESENT,BOTH PMN AND MONONUCLEAR MODERATE GRAM POSITIVE COCCI    Culture   Final    FEW ENTEROCOCCUS FAECALIS RARE DIPHTHEROIDS(CORYNEBACTERIUM SPECIES) Standardized susceptibility testing for this organism is not available. Performed at [facility], [address], [place], [place] [address]    Report Status 06/25/2024 FINAL  Final   Organism ID, Bacteria ENTEROCOCCUS FAECALIS  Final      Susceptibility   Enterococcus faecalis - MIC*    AMPICILLIN  <=2 SENSITIVE Sensitive     VANCOMYCIN  2 SENSITIVE Sensitive     GENTAMICIN SYNERGY SENSITIVE Sensitive     * FEW ENTEROCOCCUS FAECALIS    Radiology Studies: No results found.  Scheduled Meds:  dorzolamide -timolol   1 drop Both Eyes BID   empagliflozin   10 mg Oral Daily   furosemide   20 mg Oral Daily   heparin   5,000 Units Subcutaneous Q12H   insulin  aspart  0-6 Units Subcutaneous TID WC   insulin  glargine  6 Units Subcutaneous QHS   nystatin   1 Application Topical TID   pantoprazole   40 mg Oral BID   rosuvastatin   20 mg Oral QHS   sodium chloride  flush  3 mL Intravenous Q12H   spironolactone   25 mg Oral Daily   Continuous Infusions:  cefTRIAXone  (ROCEPHIN )  IV 2 g (06/25/24 1039)   DAPTOmycin   Stopped (06/24/24 1525)     LOS: 5 days    Time spent: 35 mins    Darcel Dawley, MD [facility]   If 7PM-7AM, please contact night-coverage

## 2024-06-25 NOTE — Evaluation (Signed)
 Physical Therapy Evaluation Patient Details Name: Jill Munoz MRN: [id number] DOB: 08/27/1951 Today's Date: 06/25/2024  History of Present Illness  Jill Munoz is a 73 y.o. 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 Podiatry recommend partial 5th ray amputation. Pt not amenable to that at this time. 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   Clinical Impression  Pt admitted with above diagnosis. PTA, pt required assist with functional mobility, ADLs, and IADLs. She performed squat pivot transfers to w/c with the aid of her husband using a gait belt and has been able to propel herself in the w/c on good days. Pt rarely sleeps in hospital bed at home reporting it is uncomfortable and will opt to stay in w/c elevating her legs on furniture/pillow within the home. Her husband alluded to an aide coming once a week to assist with bathing/grooming, but reports this individual has secured full-time employment and is unsure of the level of support they will be able to provide in the future. Pt lives with her husband in a two story house with a ramped entrance and is able to reside on the main level. Pt currently with functional limitations due to the deficits listed below (see PT Problem List). She required maxA for bed mobility and max-totalA to maintain seated balance EOB d/t significant posterior lean. Educated pt that she is able to weight bear through the heel of her right foot. She chose to maintain RLE NWB. Pt refused to attempt OOB mobility and was actively attempting to return to bed. Pt will benefit from acute skilled PT to increase her independence and safety with mobility to allow discharge. Given pt's CLOF, level of assist available, and moderate fall risk recommend continued inpatient follow up therapy, <3 hours/day.     If plan is discharge home, recommend the following: Two people to help with walking and/or transfers;Two people to help with  bathing/dressing/bathroom;Assistance with cooking/housework;Assistance with feeding;Assist for transportation;Help with stairs or ramp for entrance   Can travel by private vehicle   No    Equipment Recommendations Chief of Staff for Other Services  OT consult    Functional Status Assessment Patient has had a recent decline in their functional status and/or demonstrates limited ability to make significant improvements in function in a reasonable and predictable amount of time     Precautions / Restrictions Precautions Precautions: Fall Recall of Precautions/Restrictions: Impaired Precaution/Restrictions Comments: Fear of Falling Restrictions Weight Bearing Restrictions Per Provider Order: Yes RLE Weight Bearing Per Provider Order:  (Heel touch on the right)      Mobility  Bed Mobility Overal bed mobility: Needs Assistance Bed Mobility: Supine to Sit, Sit to Supine     Supine to sit: Max assist, HOB elevated, Used rails Sit to supine: Max assist   General bed mobility comments: Instructed pt to sit up on R side of bed. Cued sequencing and use of bed rail. Assist to bring BLE off bed, use of bed pad to pivot pt to EOB, and elevated trunk. Pt actively resting mobility, displaying significant posterior lean. Max-TotalA to achieve neutral posture seated EOB. Pt maintained RLE off floor. Educated her that she could put weight through R heel. She displayed emotional lability becoming upset and hyperbolic. Pt reported she was unable to tolerate sitting and was continuing to attempt to return to bed. Assist to bring BLE back into bed. Pt controlled trunk down. Repositioned using +2 assist and bed features.  Transfers Overall transfer level: Needs assistance                 General transfer comment: Pt refused to attempt. Recommend maximove lift for transfers.    Ambulation/Gait                  Stairs            Wheelchair Mobility     Tilt  Bed    Modified Rankin (Stroke Patients Only)       Balance Overall balance assessment: Needs assistance Sitting-balance support: Bilateral upper extremity supported, Feet unsupported Sitting balance-Leahy Scale: Zero Sitting balance - Comments: Pt sat EOB with max-totalA for support. She was eager to return to bed and uncooperative to maintain upright posture seated EOB. Postural control: Posterior lean                                   Pertinent Vitals/Pain Pain Assessment Pain Assessment: Faces Faces Pain Scale: Hurts little more Pain Location: R foot Pain Descriptors / Indicators: Grimacing, Discomfort, Aching Pain Intervention(s): Monitored during session, Limited activity within patient's tolerance, Repositioned    Home Living Family/patient expects to be discharged to:: Private residence Living Arrangements: Spouse/significant other Available Help at Discharge: Family;Available 24 hours/day (Husband reports he cannot provide the level of physical assist she requires, requesting HH Aide to alleviate him and provide for pt.) Type of Home: House Home Access: Ramped entrance       Home Layout: Two level;Able to live on main level with bedroom/bathroom;Full bath on main level Home Equipment: Shower seat;Hand held shower head;BSC/3in1;Wheelchair - Forensic psychologist (2 wheels);Hospital bed Additional Comments: Pt has a L AKA prosthesis, but husband reports it is too heavy for her and she is unable to use it.    Prior Function Prior Level of Function : Needs assist       Physical Assist : Mobility (physical);ADLs (physical) Mobility (physical): Bed mobility;Transfers;Gait ADLs (physical): Bathing;Dressing;Toileting;IADLs;Grooming;Feeding Mobility Comments: Husband reports she hasn't been sleeping in the bed much in the past 100mo as it is uncomfortable. Pt has been staying in the w/c for the majority of the time even at night. Transfers via squat pivot with  assist. Husband reports using a gait belt.  He states the pt has barely stood in months and is avoiding her R foot wound. She is able to propel w/c herself on good days. Denies fall history. ADLs Comments: Pt relies on another person to wash her for the past 2 years. Husband reports this individual comes generally 1/wk, sounds like an Aide. Pt uses BSC for all toileting. She is unable to perform pericare, relies on husband for hygiene. Husband reports difficulty with BM and relies on enemas often d/t constipation. Pt needs assist with all dressing, wears flexible clothing, rarely wears shoes. Husband reports pt has difficulty with digestion. He will bring the food to her mouth to feed her. Husband manages all IADLs and provides transportation.     Extremity/Trunk Assessment   Upper Extremity Assessment Upper Extremity Assessment: Defer to OT evaluation    Lower Extremity Assessment Lower Extremity Assessment: Generalized weakness    Cervical / Trunk Assessment Cervical / Trunk Assessment: Kyphotic  Communication   Communication Communication: No apparent difficulties    Cognition Arousal: Alert Behavior During Therapy: Anxious, Lability   PT - Cognitive impairments: Awareness, Initiation, Sequencing, Problem solving, Safety/Judgement  PT - Cognition Comments: Pt A,Ox4. She deferred to her  husband Lucina to provide information over the phone. Pt nervous to mobilize d/t fear of falling. She appears to have decreased insight into current health situation. Following commands: Impaired Following commands impaired: Follows one step commands with increased time, Follows one step commands inconsistently     Cueing Cueing Techniques: Verbal cues, Gestural cues, Visual cues     General Comments General comments (skin integrity, edema, etc.): Pt with R foot in dressing and ace bandage.    Exercises     Assessment/Plan    PT Assessment Patient needs  continued PT services  PT Problem List Decreased strength;Decreased activity tolerance;Decreased balance;Decreased mobility;Decreased coordination;Decreased knowledge of use of DME;Decreased safety awareness;Decreased knowledge of precautions;Impaired sensation;Decreased skin integrity       PT Treatment Interventions DME instruction;Functional mobility training;Therapeutic activities;Therapeutic exercise;Balance training;Patient/family education;Wheelchair mobility training    PT Goals (Current goals can be found in the Care Plan section)  Acute Rehab PT Goals Patient Stated Goal: Get further assistance to help manage me at home PT Goal Formulation: With patient/family Time For Goal Achievement: 07/09/24 Potential to Achieve Goals: Fair    Frequency Min 2X/week     Co-evaluation               AM-PAC PT 6 Clicks Mobility  Outcome Measure Help needed turning from your back to your side while in a flat bed without using bedrails?: A Lot Help needed moving from lying on your back to sitting on the side of a flat bed without using bedrails?: A Lot Help needed moving to and from a bed to a chair (including a wheelchair)?: Total Help needed standing up from a chair using your arms (e.g., wheelchair or bedside chair)?: Total Help needed to walk in hospital room?: Total Help needed climbing 3-5 steps with a railing? : Total 6 Click Score: 8    End of Session   Activity Tolerance: Other (comment) (Treatment limited secondary to pt's willingess to participate) Patient left: in bed;with call bell/phone within reach;with bed alarm set Nurse Communication: Mobility status;Need for lift equipment;Weight bearing status PT Visit Diagnosis: Muscle weakness (generalized) (M62.81);Other abnormalities of gait and mobility (R26.89)    Time: 1100-1135 PT Time Calculation (min) (ACUTE ONLY): 35 min   Charges:   PT Evaluation $PT Eval Moderate Complexity: 1 Mod PT Treatments $Therapeutic  Activity: 8-22 mins PT General Charges $$ ACUTE PT VISIT: 1 Visit         Randall SAUNDERS, PT, DPT Acute Rehabilitation Services Office: [phone] Secure Chat Preferred   Delon CHRISTELLA Callander 06/25/2024, 12:19 PM

## 2024-06-25 NOTE — Plan of Care (Signed)
  Problem: Clinical Measurements: Goal: Respiratory complications will improve Outcome: Progressing Goal: Cardiovascular complication will be avoided Outcome: Progressing   Problem: Elimination: Goal: Will not experience complications related to urinary retention Outcome: Progressing   Problem: Pain Managment: Goal: General experience of comfort will improve and/or be controlled Outcome: Progressing   Problem: Health Behavior/Discharge Planning: Goal: Ability to manage health-related needs will improve Outcome: Not Progressing   Problem: Skin Integrity: Goal: Risk for impaired skin integrity will decrease Outcome: Not Progressing   Problem: Education: Goal: Knowledge of General Education information will improve Description: Including pain rating scale, medication(s)/side effects and non-pharmacologic comfort measures Outcome: Not Progressing

## 2024-06-25 NOTE — TOC Progression Note (Signed)
 Transition of Care [facility]) - Progression Note    Patient Details  Name: Jill Munoz MRN: [id number] Date of Birth: 30-Jan-1951  Transition of Care [facility]) CM/SW Contact  Bridget Cordella Simmonds, LCSW Phone Number: 06/25/2024, 2:11 PM  Clinical Narrative:   CSW spoke with pt and husband regarding PT recommendation for SNF.  Pt was at [facility] previously, did not have a good experience, is not willing to go to any SNF.  Lengthy discussion had about DC home with [facility]  CSW reiterated that pt medicare will not cover [facility] aide to come in the home for extended periods of time.  Husband reports they have had someone coming once a week to help bathe pt, unclear if this is friend or if they are paying for this.  Husband reports there is one female cousin in the area, who may offer some limited help.  They would like DME.  Pt has wheelchair but would like it replaced.  They would like a lift.  Pt has a walker and her current wheelchair.  They want to move forward with [facility] with as much help as they can get with PT/OT/HH aide.    RNCM notified.     Expected Discharge Plan: Home w Home Health Services Barriers to Discharge: Continued Medical Work up               Expected Discharge Plan and Services     Post Acute Care Choice: Home Health, Durable Medical Equipment Living arrangements for the past 2 months: Single Family Home                                       Social Drivers of Health (SDOH) Interventions SDOH Screenings   Food Insecurity: No Food Insecurity (06/20/2024)  Housing: Low Risk  (06/20/2024)  Transportation Needs: No Transportation Needs (06/20/2024)  Utilities: Not At Risk (06/20/2024)  Alcohol  Screen: Low Risk  (10/14/2023)  Depression (PHQ2-9): Low Risk  (10/14/2023)  Financial Resource Strain: Low Risk  (10/14/2023)  Physical Activity: Inactive (10/14/2023)  Social Connections: Moderately Isolated (06/20/2024)  Stress: No Stress Concern Present (10/14/2023)  Tobacco Use: Low  Risk  (06/19/2024)  Health Literacy: Adequate Health Literacy (10/14/2023)    Readmission Risk Interventions     No data to display

## 2024-06-26 DIAGNOSIS — L02611 Cutaneous abscess of right foot: Secondary | ICD-10-CM | POA: Diagnosis not present

## 2024-06-26 DIAGNOSIS — M869 Osteomyelitis, unspecified: Secondary | ICD-10-CM | POA: Diagnosis not present

## 2024-06-26 DIAGNOSIS — M86171 Other acute osteomyelitis, right ankle and foot: Secondary | ICD-10-CM | POA: Diagnosis not present

## 2024-06-26 LAB — GLUCOSE, CAPILLARY
Glucose-Capillary: 140 mg/dL — ABNORMAL HIGH (ref 70–99)
Glucose-Capillary: 167 mg/dL — ABNORMAL HIGH (ref 70–99)

## 2024-06-26 LAB — AEROBIC/ANAEROBIC CULTURE W GRAM STAIN (SURGICAL/DEEP WOUND)

## 2024-06-26 MED ORDER — DOXYCYCLINE HYCLATE 100 MG PO TABS
100.0000 mg | ORAL_TABLET | Freq: Two times a day (BID) | ORAL | Status: DC
Start: 2024-06-26 — End: 2024-06-26
  Administered 2024-06-26: 100 mg via ORAL
  Filled 2024-06-26: qty 1

## 2024-06-26 MED ORDER — DOXYCYCLINE HYCLATE 100 MG PO TABS: 100.0000 mg | ORAL_TABLET | Freq: Two times a day (BID) | ORAL | 0 refills | Status: DC

## 2024-06-26 MED ORDER — NYSTATIN 100000 UNIT/GM EX POWD: 1.0000 | Freq: Three times a day (TID) | CUTANEOUS | 0 refills | Status: AC

## 2024-06-26 MED ORDER — AMOXICILLIN-POT CLAVULANATE 875-125 MG PO TABS
1.0000 | ORAL_TABLET | Freq: Two times a day (BID) | ORAL | Status: DC
Start: 2024-06-26 — End: 2024-06-26
  Administered 2024-06-26: 1 via ORAL
  Filled 2024-06-26: qty 1

## 2024-06-26 MED ORDER — DIPHENHYDRAMINE HCL 25 MG PO CAPS
25.0000 mg | ORAL_CAPSULE | Freq: Once | ORAL | Status: AC
Start: 2024-06-26 — End: 2024-06-26
  Administered 2024-06-26: 25 mg via ORAL
  Filled 2024-06-26: qty 1

## 2024-06-26 MED ORDER — AMOXICILLIN-POT CLAVULANATE 875-125 MG PO TABS: 1.0000 | ORAL_TABLET | Freq: Two times a day (BID) | ORAL | 0 refills | Status: DC

## 2024-06-26 NOTE — Discharge Instructions (Signed)
 Advised to follow up podiatry in 2 weeks. Advised to take augmentin  and doxycyline twice daily for 42 days ( total 6 weeks) Advised wound care.

## 2024-06-26 NOTE — Evaluation (Signed)
 Occupational Therapy Evaluation Patient Details Name: Jill Munoz MRN: [id number] DOB: 1950-10-29 Today's Date: 06/26/2024   History of Present Illness   Jill Munoz is a 73 y.o. 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 Podiatry recommend partial 5th ray amputation. Pt not amenable to that at this time. 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     Clinical Impressions Pt is dependent in mobility and ADLs at baseline. Desires to be able to self feed and participate more in ADLs. Pt with low vision. Family educated in compensatory strategies and options for adaptive equipment for pt to be able to self feed. Will need reinforcement by HHOT. Pt's husband reports a custom w/c has been ordered for pt in conjunction with her HHPT, case manager notified. Pt will benefit from a hoyer lift for safety with transfers at home. No further acute OT needs.     If plan is discharge home, recommend the following:   Two people to help with walking and/or transfers;Two people to help with bathing/dressing/bathroom;Assistance with feeding;Direct supervision/assist for medications management;Direct supervision/assist for financial management;Assist for transportation;Help with stairs or ramp for entrance     Functional Status Assessment   Patient has had a recent decline in their functional status and/or demonstrates limited ability to make significant improvements in function in a reasonable and predictable amount of time     Equipment Recommendations   Hoyer lift     Recommendations for Other Services         Precautions/Restrictions   Precautions Precautions: Fall Precaution/Restrictions Comments: low vision, fear of falling Restrictions Weight Bearing Restrictions Per Provider Order: No     Mobility Bed Mobility   Bed Mobility: Rolling Rolling: Max assist              Transfers                   General  transfer comment: declined      Balance     Sitting balance-Leahy Scale: Zero                                     ADL either performed or assessed with clinical judgement   ADL Overall ADL's : At baseline                                       General ADL Comments: wants to self feed and groom     Vision Baseline Vision/History: 2 Legally blind Patient Visual Report: No change from baseline Additional Comments: no vision on L, low vision on R     Perception         Praxis         Pertinent Vitals/Pain Pain Assessment Pain Assessment: No/denies pain     Extremity/Trunk Assessment Upper Extremity Assessment Upper Extremity Assessment: Generalized weakness;Right hand dominant       Cervical / Trunk Assessment Cervical / Trunk Assessment: Kyphotic;Other exceptions (weakness)   Communication Communication Communication: No apparent difficulties   Cognition Arousal: Alert Behavior During Therapy: Anxious               OT - Cognition Comments: likely baseline                 Following commands: Impaired Following commands impaired:  Follows one step commands with increased time, Follows one step commands inconsistently     Cueing  General Comments          Exercises     Shoulder Instructions      Home Living Family/patient expects to be discharged to:: Private residence Living Arrangements: Spouse/significant other Available Help at Discharge: Family;Available 24 hours/day;Personal care attendant (aide weekly) Type of Home: House Home Access: Ramped entrance     Home Layout: Two level;Able to live on main level with bedroom/bathroom;Full bath on main level     Bathroom Shower/Tub: Producer, television/film/video: Standard     Home Equipment: Shower seat;Hand held Armed forces logistics/support/administrative officer (2 wheels);Hospital bed;Transport chair   Additional Comments: Pt has a L AKA prosthesis, but  husband reports it is too heavy for her and she is unable to use it.      Prior Functioning/Environment Prior Level of Function : Needs assist             Mobility Comments: sleeps in travel chair frequently, squat pivots with assist ADLs Comments: dependent in all ADLs and IADLs    OT Problem List:     OT Treatment/Interventions:        OT Goals(Current goals can be found in the care plan section)       OT Frequency:       Co-evaluation              AM-PAC OT 6 Clicks Daily Activity     Outcome Measure Help from another person eating meals?: Total Help from another person taking care of personal grooming?: Total Help from another person toileting, which includes using toliet, bedpan, or urinal?: Total Help from another person bathing (including washing, rinsing, drying)?: Total Help from another person to put on and taking off regular upper body clothing?: Total Help from another person to put on and taking off regular lower body clothing?: Total 6 Click Score: 6   End of Session    Activity Tolerance: Patient tolerated treatment well Patient left: in bed;with call bell/phone within reach;with family/visitor present  OT Visit Diagnosis: Muscle weakness (generalized) (M62.81)                Time: 0950-1006 OT Time Calculation (min): 16 min Charges:  OT General Charges $OT Visit: 1 Visit OT Evaluation $OT Eval Moderate Complexity: 1 Mod  Mliss HERO, OTR/L Acute Rehabilitation Services Office: [phone]   Kennth Mliss Helling 06/26/2024, 10:48 AM

## 2024-06-26 NOTE — Plan of Care (Signed)
  Problem: Metabolic: Goal: Ability to maintain appropriate glucose levels will improve Outcome: Progressing   Problem: Clinical Measurements: Goal: Ability to maintain clinical measurements within normal limits will improve Outcome: Progressing   Problem: Elimination: Goal: Will not experience complications related to bowel motility Outcome: Progressing Goal: Will not experience complications related to urinary retention Outcome: Progressing   Problem: Pain Managment: Goal: General experience of comfort will improve and/or be controlled Outcome: Progressing   Problem: Safety: Goal: Ability to remain free from injury will improve Outcome: Progressing   Problem: Skin Integrity: Goal: Risk for impaired skin integrity will decrease Outcome: Progressing

## 2024-06-26 NOTE — Progress Notes (Signed)
 Discharge instructions discussed with patient and spouse. All questions answered.   Rosmary Dionisio, RN

## 2024-06-26 NOTE — Progress Notes (Signed)
    Durable Medical Equipment  (From admission, onward)           Start     Ordered   06/26/24 1108  For home use only DME Other see comment  Once       Comments: Hoyer lift and pad  Question:  Length of Need  Answer:  12 Months   06/26/24 1108

## 2024-06-26 NOTE — TOC Transition Note (Signed)
 Transition of Care [facility]) - Discharge Note   Patient Details  Name: Jill Munoz MRN: [id number] Date of Birth: 1951/09/04  Transition of Care [facility]) CM/SW Contact:  Rosalva Jon Bloch, RN Phone Number: 06/26/2024, 11:26 AM   Clinical Narrative:    Patient will DC to: home Anticipated DC date: 06/26/2024 Family notified: yes Transport by:  car    - Osteomyelitis of fifth toe of right foot  Per MD patient ready for DC today. Pt declined SNF placement. Husband to assist with care once d/c. RN, patient, and patient's husband notified of DC. Pt agreeable to home health services. Pt without provider preference. Referral made with [facility] and accepted. Referral for DME hoyer lift and pad made with [employer]. Equipment will be delivered to pt's home. Pt states has already ordered a customized W/C and should be delivered to their home( can't remember  provider). Husband to provide transportation to home. Pt without RX med concerns. Post hospital f/u noted on AVS.   RNCM will sign off for now as intervention is no longer needed. Please consult us  again if new needs arise.    Final next level of care: Home w Home Health Services Barriers to Discharge: No Barriers Identified   Patient Goals and CMS Choice Patient states their goals for this hospitalization and ongoing recovery are:: get home   Choice offered to / list presented to : Patient      Discharge Placement                       Discharge Plan and Services Additional resources added to the After Visit Summary for       Post Acute Care Choice: Home Health, Durable Medical Equipment          DME Arranged: Other see comment (hoyer lift and pad) DME Agency: [employer] Date DME Agency Contacted: 06/26/24 Time DME Agency Contacted: 1112 Representative spoke with at DME Agency: Darlyn HH Arranged: RN, PT, OT, Nurse's Aide, Social Work [employer] Agency: [facility] Date HH Agency Contacted: 06/26/24 Time HH  Agency Contacted: 1112 Representative spoke with at [facility] Agency: [facility]  Social Drivers of Health (SDOH) Interventions SDOH Screenings   Food Insecurity: No Food Insecurity (06/20/2024)  Housing: Low Risk  (06/20/2024)  Transportation Needs: No Transportation Needs (06/20/2024)  Utilities: Not At Risk (06/20/2024)  Alcohol  Screen: Low Risk  (10/14/2023)  Depression (PHQ2-9): Low Risk  (10/14/2023)  Financial Resource Strain: Low Risk  (10/14/2023)  Physical Activity: Inactive (10/14/2023)  Social Connections: Moderately Isolated (06/20/2024)  Stress: No Stress Concern Present (10/14/2023)  Tobacco Use: Low Risk  (06/19/2024)  Health Literacy: Adequate Health Literacy (10/14/2023)     Readmission Risk Interventions     No data to display

## 2024-06-26 NOTE — Discharge Summary (Signed)
 Physician Discharge Summary  Jill Munoz FMW:[id number] DOB: [birth year] DOA: 06/19/2024  PCP: Ransom Other, MD  Admit date: 06/19/2024  Discharge date: 06/26/2024  Admitted From: Home  Disposition:  Home Health Services.  Recommendations for Outpatient Follow-up:  Follow up with PCP in 1-2 weeks. Please obtain BMP/CBC in one week. Advised to follow up Podiatry in 2 weeks. Advised to take augmentin  and doxycyline twice daily for 42 days ( total 6 weeks) Advised wound care.  Home Health:Home PT/OT Equipment/Devices:None  Discharge Condition: Stable CODE STATUS:Full code Diet recommendation: Carb Modified   Brief Summary/ Hospital Course: This 73 y.o. Female with PVD s/p Lt. AKA, DM poorly controlled, HTN presented with non-healing diabetic foot ulcer. She is found to have osteomyelitis of the 5th metatarsal head on MRI. Ortho consulted, recommended amputation, but patient requested second opinion.  ID consulted. Continued on IV antibiotics. Patient wants to treat infection with oral antibiotics. She declined amputation.  ID recommended patient can be discharged on Augmentin  and doxycycline  twice a day for total 6 weeks duration until she follows up with podiatry.  Advised wound care.  Patient is being discharged home,  home health services arranged.   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  Orthopedic were consulted, recommended ray amputation.  Family asked for second opinion. She refused amputation. ID was consulted, podiatry performed bedside deep biopsy.  06/23/24 : ID recommended against IV antibiotics. Awaiting bedside culture results, tailoring oral antibiotics for outpatient management. Continue Rocephin  and daptomycin  for now. Recommended Augmentin  and doxycycline  twice a day  for 6 weeks duration. Advised to follow-up with podiatry as scheduled.   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 moderately reduced ejection fraction: She was treated with diuretics, now resolved. Continue home spironolactone , furosemide . Resume Jardiance   Resume losartan .   Peripheral vascular disease: Hyperlipidemia: Hypertension ABI this admission showed normal DP ABI, but decreased toe brachial index. Blood pressure normal Continue Crestor , furosemide , spironolactone  Resume losartan     Chronic kidney disease stage II: Serum creatinine at baseline.   Anemia of chronic disease Hemoglobin is stabilized. No signs of bleeding   Diabetes Mellitus II: Blood sugars mostly controlled. Continue glargine, sliding scale correction insulin  Resume Jardiance     Discharge Instructions  Discharge Instructions     Call MD for:  difficulty breathing, headache or visual disturbances   Complete by: As directed    Call MD for:  persistant dizziness or light-headedness   Complete by: As directed    Call MD for:  persistant nausea and vomiting   Complete by: As directed    Diet - low sodium heart healthy   Complete by: As directed    Diet Carb Modified   Complete by: As directed    Discharge instructions   Complete by: As directed    Advised to follow up podiatry in 2 weeks. Advised to take augmentin  and doxycyline twice daily for 42 days ( total 6 weeks) Advised wound care.   Discharge wound care:   Complete by: As directed    Follow up Wound care   Increase activity slowly   Complete by: As directed       Allergies as of 06/26/2024       Reactions   Other    States she is allergic to abx but does not know name    Amoxicillin   Diarrhea        Medication List     STOP taking these medications    Bactrim DS 800-160 MG tablet Generic drug: sulfamethoxazole-trimethoprim   doxycycline  100 MG EC tablet Commonly known as: DORYX  Replaced by: doxycycline  100 MG tablet    pantoprazole  40 MG tablet Commonly known as: PROTONIX    valACYclovir  1000 MG tablet Commonly known as: VALTREX        TAKE these medications    Accu-Chek Guide test strip Generic drug: glucose blood Use as instructed once a day   amoxicillin -clavulanate 875-125 MG tablet Commonly known as: AUGMENTIN  Take 1 tablet by mouth every 12 (twelve) hours.   Berberine Chloride 500 MG Caps Take 1 capsule by mouth in the morning, at noon, and at bedtime.   dorzolamide -timolol  2-0.5 % ophthalmic solution Commonly known as: COSOPT  Place 1 drop into both eyes 2 (two) times daily.   doxycycline  100 MG tablet Commonly known as: VIBRA -TABS Take 1 tablet (100 mg total) by mouth every 12 (twelve) hours. Replaces: doxycycline  100 MG EC tablet   furosemide  20 MG tablet Commonly known as: LASIX  Take Lasix  20 mg daily. You may take an addition Lasix  20 mg daily (for a total of 40 mg) as needed for swelling.   Jardiance  10 MG Tabs tablet Generic drug: empagliflozin  Take 10 mg by mouth daily.   losartan  50 MG tablet Commonly known as: COZAAR  Take 50 mg by mouth daily.   melatonin 3 MG Tabs tablet Take 3 mg by mouth at bedtime.   nystatin  powder Commonly known as: MYCOSTATIN /NYSTOP  Apply 1 Application topically 3 (three) times daily.   omeprazole 40 MG capsule Commonly known as: PRILOSEC Take 60 mg by mouth in the morning and at bedtime.   promethazine  12.5 MG tablet Commonly known as: PHENERGAN  Take 12.5 mg by mouth every 6 (six) hours as needed for vomiting.   rosuvastatin  20 MG tablet Commonly known as: CRESTOR  Take 1 tablet (20 mg total) by mouth at bedtime.   spironolactone  25 MG tablet Commonly known as: ALDACTONE  Take 1 tablet (25 mg total) by mouth daily.   traMADol  50 MG tablet Commonly known as: ULTRAM  Take 50 mg by mouth every 6 (six) hours as needed for moderate pain (pain score 4-6).   vitamin C 1000 MG tablet Take 1,000 mg by mouth daily.                Durable Medical Equipment  (From admission, onward)           Start     Ordered   06/26/24 1108  For home use only DME Other see comment  Once       Comments: Hoyer lift and pad  Question:  Length of Need  Answer:  12 Months   06/26/24 1108              Discharge Care Instructions  (From admission, onward)           Start     Ordered   06/26/24 0000  Discharge wound care:       Comments: Follow up Wound care   06/26/24 1106            Follow-up Information     Harden Jerona GAILS, MD Follow up in 1 week(s).   Specialty: Orthopedic Surgery Contact information: [address] [place] [address] [phone]         Dennise Kingsley, MD Follow up in 4 week(s).   Specialty: Infectious Diseases Contact  information: [address] [place] [address] [phone]         [facility] Follow up.   Specialty: Home Health Services Why: Home health services will be provided by [facility] , start of care within 48 hours post discharge Contact information: [address] [place] [address] [phone]                Allergies  Allergen Reactions   Other     States she is allergic to abx but does not know name    Amoxicillin  Diarrhea    Consultations: Podiatry Infectious Diseases.   Procedures/Studies: VAS US  ABI WITH/WO TBI Result Date: 06/21/2024  LOWER EXTREMITY DOPPLER STUDY Patient Name:  Jill Munoz  Date of Exam:   06/20/2024 Medical Rec #: [id number]        Accession #:    [id number] Date of Birth: [birth year]         Patient Gender: F Patient Age:   66 years Exam Location:  [facility] Procedure:      VAS US  ABI WITH/WO TBI Referring Phys: DORN DAWSON --------------------------------------------------------------------------------  Indications: Rest pain, ulceration, and peripheral artery disease. left AKA High Risk Factors: Hypertension, Diabetes.  Comparison Study:  No prior exam. Performing Technologist: Edilia Elden Appl  Examination Guidelines: A complete evaluation includes at minimum, Doppler waveform signals and systolic blood pressure reading at the level of bilateral brachial, anterior tibial, and posterior tibial arteries, when vessel segments are accessible. Bilateral testing is considered an integral part of a complete examination. Photoelectric Plethysmograph (PPG) waveforms and toe systolic pressure readings are included as required and additional duplex testing as needed. Limited examinations for reoccurring indications may be performed as noted.  ABI Findings: +---------+------------------+-----+---------+--------+ Right    Rt Pressure (mmHg)IndexWaveform Comment  +---------+------------------+-----+---------+--------+ Brachial 138                    triphasic         +---------+------------------+-----+---------+--------+ PTA      153               1.11 biphasic          +---------+------------------+-----+---------+--------+ DP       160               1.16 biphasic          +---------+------------------+-----+---------+--------+ Great Toe51                0.37 Normal            +---------+------------------+-----+---------+--------+ +--------+------------------+-----+---------+-------+ Left    Lt Pressure (mmHg)IndexWaveform Comment +--------+------------------+-----+---------+-------+ [id number]                    triphasic        +--------+------------------+-----+---------+-------+ +-------+-----------+-----------+------------+------------+ ABI/TBIToday's ABIToday's TBIPrevious ABIPrevious TBI +-------+-----------+-----------+------------+------------+ Right  1.16       0.37                                +-------+-----------+-----------+------------+------------+  Summary: Right: Resting right ankle-brachial index is within normal range. The right toe-brachial index is abnormal.  Left:  Above knee  amputation. *See table(s) above for measurements and observations.  Electronically signed by Gaile New MD on 06/21/2024 at 8:24:37 PM.    Final    MR TOES RIGHT W WO CONTRAST Result Date: 06/20/2024 CLINICAL DATA:  Fifth toe ulcer. EXAM: MRI OF  THE RIGHT TOES WITHOUT AND WITH CONTRAST TECHNIQUE: Multiplanar, multisequence MR imaging of the fifth toe was performed both before and after administration of intravenous contrast. CONTRAST:  5mL GADAVIST  GADOBUTROL  1 MMOL/ML IV SOLN COMPARISON:  None Available. FINDINGS: Bones/Joint/Cartilage Soft tissue wound along the plantar aspect of the fifth MTP joint. Bone marrow edema in the fifth metatarsal head and distal diaphysis as well as the base of the fifth proximal phalanx consistent with osteomyelitis. Normal alignment. No joint effusion. Mild osteoarthritis of the first MTP joint. Ligaments Collateral ligaments are intact.  Lisfranc ligament is intact. Muscles and Tendons Flexor, peroneal and extensor compartment tendons are intact. T2 hyperintense signal throughout the plantar musculature likely neurogenic. Soft tissue 1 x 1 x 1.6 cm fluid collection along the plantar lateral aspect of the fifth MTP joint concerning for a small abscess.No soft tissue mass. Soft tissue edema along the dorsal aspect of the forefoot concerning for cellulitis. IMPRESSION: 1. Soft tissue wound along the plantar aspect of the fifth MTP joint. Bone marrow edema in the fifth metatarsal head and distal diaphysis as well as the base of the fifth proximal phalanx consistent with osteomyelitis. 2. A 1 x 1 x 1.6 cm fluid collection along the plantar lateral aspect of the fifth MTP joint concerning for a small abscess. Electronically Signed   By: Julaine Blanch M.D.   On: 06/20/2024 08:38   DG Foot Complete Right Result Date: 06/19/2024 CLINICAL DATA:  Fifth toe ulcer EXAM: RIGHT FOOT COMPLETE - 3+ VIEW COMPARISON:  Radiographs 05/27/2024 FINDINGS: Progressive bony demineralization with cortical  destruction in the head of the fifth metatarsal, highly suspicious for active osteomyelitis. Possible involvement of the base of the proximal phalanx small toe. Generalized bony demineralization in the foot noted. Atheromatous vascular calcifications. Small plantar and Achilles calcaneal spurs. Suspected infiltrative subcutaneous edema in the distal calf. Dorsal infiltrative edema in the forefoot. IMPRESSION: 1. New bony demineralization with cortical destruction in the head of the fifth metatarsal, highly suspicious for active osteomyelitis given the clinical context. Possible involvement of the base of the proximal phalanx small toe. 2. Generalized bony demineralization. 3. Atheromatous vascular calcifications. 4. Suspected infiltrative subcutaneous edema in the distal calf and dorsal forefoot. Electronically Signed   By: Ryan Salvage M.D.   On: 06/19/2024 18:42     Subjective: Patient was seen and examined at bedside.  Overnight events noted. Patient reports feeling much better,  She does not want amputation.   She wants to be discharged on oral antibiotics,  Home health services arranged.  Discharge Exam: Vitals:   06/26/24 0525 06/26/24 0812  BP: (!) 121/53 120/61  Pulse: 76 73  Resp: 20 16  Temp: 97.9 F (36.6 C) 98 F (36.7 C)  SpO2: 99% 97%   Vitals:   06/25/24 1741 06/25/24 2120 06/26/24 0525 06/26/24 0812  BP: (!) 140/62 (!) 147/70 (!) 121/53 120/61  Pulse:  84 76 73  Resp:  20 20 16   Temp:   97.9 F (36.6 C) 98 F (36.7 C)  TempSrc:   Oral Oral  SpO2:  95% 99% 97%  Weight:      Height:        General: Pt is alert, awake, not in acute distress Cardiovascular: RRR, S1/S2 +, no rubs, no gallops Respiratory: CTA bilaterally, no wheezing, no rhonchi Abdominal: Soft, NT, ND, bowel sounds + Extremities: Left AKA, no edema, no cyanosis    The results of significant diagnostics from this hospitalization (including imaging, microbiology, ancillary and laboratory)  are  listed below for reference.     Microbiology: Recent Results (from the past 240 hours)  Blood culture (routine x 2)     Status: None   Collection Time: 06/19/24  5:39 PM   Specimen: BLOOD RIGHT HAND  Result Value Ref Range Status   Specimen Description BLOOD RIGHT HAND  Final   Special Requests   Final    BOTTLES DRAWN AEROBIC AND ANAEROBIC Blood Culture results may not be optimal due to an inadequate volume of blood received in culture bottles   Culture   Final    NO GROWTH 5 DAYS Performed at [facility], [address] [place], [place] [address]    Report Status 06/24/2024 FINAL  Final  Blood culture (routine x 2)     Status: None   Collection Time: 06/19/24 10:24 PM   Specimen: BLOOD  Result Value Ref Range Status   Specimen Description BLOOD SITE NOT SPECIFIED  Final   Special Requests   Final    BOTTLES DRAWN AEROBIC AND ANAEROBIC Blood Culture adequate volume   Culture   Final    NO GROWTH 5 DAYS Performed at [facility], [address], [place], [place] [address]    Report Status 06/24/2024 FINAL  Final  Aerobic/Anaerobic Culture w Gram Stain (surgical/deep wound)     Status: None   Collection Time: 06/22/24  8:42 AM   Specimen: Path Tissue  Result Value Ref Range Status   Specimen Description TISSUE  Final   Special Requests RIGHT FOOT  Final   Gram Stain   Final    ABUNDANT WBC PRESENT, PREDOMINANTLY PMN ABUNDANT GRAM POSITIVE COCCI MODERATE GRAM POSITIVE RODS MODERATE GRAM NEGATIVE RODS    Culture   Final    MODERATE ENTEROCOCCUS FAECALIS FEW STAPHYLOCOCCUS EPIDERMIDIS ABUNDANT DIPHTHEROIDS(CORYNEBACTERIUM SPECIES) Standardized susceptibility testing for this organism is not available. ABUNDANT BACTEROIDES FRAGILIS BETA LACTAMASE POSITIVE Performed at [facility], [address], [place], [place] [address]    Report Status 06/26/2024 FINAL  Final   Organism ID, Bacteria ENTEROCOCCUS FAECALIS  Final   Organism ID, Bacteria  STAPHYLOCOCCUS EPIDERMIDIS  Final      Susceptibility   Enterococcus faecalis - MIC*    AMPICILLIN  <=2 SENSITIVE Sensitive     VANCOMYCIN  2 SENSITIVE Sensitive     GENTAMICIN SYNERGY SENSITIVE Sensitive     * MODERATE ENTEROCOCCUS FAECALIS   Staphylococcus epidermidis - MIC*    CIPROFLOXACIN <=0.5 SENSITIVE Sensitive     ERYTHROMYCIN >=8 RESISTANT Resistant     GENTAMICIN <=0.5 SENSITIVE Sensitive     OXACILLIN <=0.25 SENSITIVE Sensitive     TETRACYCLINE 2 SENSITIVE Sensitive     VANCOMYCIN  2 SENSITIVE Sensitive     TRIMETH/SULFA 160 RESISTANT Resistant     CLINDAMYCIN >=8 RESISTANT Resistant     RIFAMPIN <=0.5 SENSITIVE Sensitive     Inducible Clindamycin NEGATIVE Sensitive     * FEW STAPHYLOCOCCUS EPIDERMIDIS  Aerobic Culture w Gram Stain (superficial specimen)     Status: None   Collection Time: 06/22/24  8:43 AM   Specimen: Wound  Result Value Ref Range Status   Specimen Description WOUND  Final   Special Requests RIGHT FOOT  Final   Gram Stain   Final    MODERATE WBC PRESENT,BOTH PMN AND MONONUCLEAR MODERATE GRAM POSITIVE COCCI    Culture   Final    FEW ENTEROCOCCUS FAECALIS RARE DIPHTHEROIDS(CORYNEBACTERIUM SPECIES) Standardized susceptibility testing for this organism is not available. Performed at  [facility], [address], [place], [place] [address]    Report Status 06/25/2024 FINAL  Final   Organism ID, Bacteria ENTEROCOCCUS FAECALIS  Final      Susceptibility   Enterococcus faecalis - MIC*    AMPICILLIN  <=2 SENSITIVE Sensitive     VANCOMYCIN  2 SENSITIVE Sensitive     GENTAMICIN SYNERGY SENSITIVE Sensitive     * FEW ENTEROCOCCUS FAECALIS     Labs: BNP (last 3 results) Recent Labs    02/09/24 0018 05/27/24 1356 06/20/24 0633  BNP 2,457.6* 139.8* 125.1*   Basic Metabolic Panel: Recent Labs  Lab 06/20/24 0633 06/21/24 0434 06/22/24 0717 06/23/24 0448 06/24/24 0424 06/25/24 0545  NA 134* 134* 135 134* 136 136  K 4.1 3.7 4.0 3.5 3.8 3.7   CL 100 99 99 99 97* 96*  CO2 22 24 24 24 27 25   GLUCOSE 216* 87 79 173* 183* 151*  BUN 20 24* 26* 28* 28* 28*  CREATININE 1.05* 1.38* 1.35* 1.09* 1.05* 0.99  CALCIUM  8.9 9.0 8.9 8.8* 9.1 9.1  MG 1.6*  --   --   --   --  1.8  PHOS 2.7  --   --   --   --  4.5   Liver Function Tests: Recent Labs  Lab 06/19/24 1739 06/23/24 0448  AST 19 18  ALT 19 15  ALKPHOS 132* 104  BILITOT 0.5 0.4  PROT 7.9 6.7  ALBUMIN  3.3* 2.7*   No results for input(s): LIPASE, AMYLASE in the last 168 hours. No results for input(s): AMMONIA in the last 168 hours. CBC: Recent Labs  Lab 06/19/24 1739 06/19/24 1753 06/21/24 0434 06/22/24 0717 06/23/24 0448 06/24/24 0424 06/25/24 0545  WBC 6.1   < > 8.1 7.4 6.9 7.2 7.7  NEUTROABS 4.3  --   --   --   --   --   --   HGB 9.2*   < > 7.8* 7.8* 8.3* 8.3* 8.7*  HCT 28.1*   < > 23.8* 23.6* 24.6* 25.4* 26.4*  MCV 87.0   < > 85.0 85.5 85.1 86.7 86.0  PLT 426*   < > 370 333 347 349 364   < > = values in this interval not displayed.   Cardiac Enzymes: Recent Labs  Lab 06/23/24 0448  CKTOTAL 54   BNP: Invalid input(s): POCBNP CBG: Recent Labs  Lab 06/25/24 1128 06/25/24 1641 06/25/24 2230 06/26/24 0609 06/26/24 1143  GLUCAP 163* 204* 193* 140* 167*   D-Dimer No results for input(s): DDIMER in the last 72 hours. Hgb A1c No results for input(s): HGBA1C in the last 72 hours. Lipid Profile No results for input(s): CHOL, HDL, LDLCALC, TRIG, CHOLHDL, LDLDIRECT in the last 72 hours. Thyroid function studies No results for input(s): TSH, T4TOTAL, T3FREE, THYROIDAB in the last 72 hours.  Invalid input(s): FREET3 Anemia work up No results for input(s): VITAMINB12, FOLATE, FERRITIN, TIBC, IRON , RETICCTPCT in the last 72 hours. Urinalysis    Component Value Date/Time   COLORURINE YELLOW 05/09/2024 2040   APPEARANCEUR HAZY (A) 05/09/2024 2040   LABSPEC 1.009 05/09/2024 2040   PHURINE 6.0 05/09/2024 2040    GLUCOSEU NEGATIVE 05/09/2024 2040   HGBUR NEGATIVE 05/09/2024 2040   BILIRUBINUR NEGATIVE 05/09/2024 2040   KETONESUR NEGATIVE 05/09/2024 2040   PROTEINUR NEGATIVE 05/09/2024 2040   NITRITE NEGATIVE 05/09/2024 2040   LEUKOCYTESUR MODERATE (A) 05/09/2024 2040   Sepsis Labs Recent Labs  Lab 06/22/24 9282 06/23/24 0448 06/24/24 0424 06/25/24 0545  WBC 7.4 6.9 7.2 7.7   Microbiology Recent Results (from the past 240 hours)  Blood culture (routine x 2)     Status: None   Collection Time: 06/19/24  5:39 PM   Specimen: BLOOD RIGHT HAND  Result Value Ref Range Status   Specimen Description BLOOD RIGHT HAND  Final   Special Requests   Final    BOTTLES DRAWN AEROBIC AND ANAEROBIC Blood Culture results may not be optimal due to an inadequate volume of blood received in culture bottles   Culture   Final    NO GROWTH 5 DAYS Performed at [facility], [address], [place], [place] [address]    Report Status 06/24/2024 FINAL  Final  Blood culture (routine x 2)     Status: None   Collection Time: 06/19/24 10:24 PM   Specimen: BLOOD  Result Value Ref Range Status   Specimen Description BLOOD SITE NOT SPECIFIED  Final   Special Requests   Final    BOTTLES DRAWN AEROBIC AND ANAEROBIC Blood Culture adequate volume   Culture   Final    NO GROWTH 5 DAYS Performed at [facility], [address], [place], [place] [address]    Report Status 06/24/2024 FINAL  Final  Aerobic/Anaerobic Culture w Gram Stain (surgical/deep wound)     Status: None   Collection Time: 06/22/24  8:42 AM   Specimen: Path Tissue  Result Value Ref Range Status   Specimen Description TISSUE  Final   Special Requests RIGHT FOOT  Final   Gram Stain   Final    ABUNDANT WBC PRESENT, PREDOMINANTLY PMN ABUNDANT GRAM POSITIVE COCCI MODERATE GRAM POSITIVE RODS MODERATE GRAM NEGATIVE RODS    Culture   Final    MODERATE ENTEROCOCCUS FAECALIS FEW STAPHYLOCOCCUS EPIDERMIDIS ABUNDANT  DIPHTHEROIDS(CORYNEBACTERIUM SPECIES) Standardized susceptibility testing for this organism is not available. ABUNDANT BACTEROIDES FRAGILIS BETA LACTAMASE POSITIVE Performed at [facility], [address] [place], [place] [address]    Report Status 06/26/2024 FINAL  Final   Organism ID, Bacteria ENTEROCOCCUS FAECALIS  Final   Organism ID, Bacteria STAPHYLOCOCCUS EPIDERMIDIS  Final      Susceptibility   Enterococcus faecalis - MIC*    AMPICILLIN  <=2 SENSITIVE Sensitive     VANCOMYCIN  2 SENSITIVE Sensitive     GENTAMICIN SYNERGY SENSITIVE Sensitive     * MODERATE ENTEROCOCCUS FAECALIS   Staphylococcus epidermidis - MIC*    CIPROFLOXACIN <=0.5 SENSITIVE Sensitive     ERYTHROMYCIN >=8 RESISTANT Resistant     GENTAMICIN <=0.5 SENSITIVE Sensitive     OXACILLIN <=0.25 SENSITIVE Sensitive     TETRACYCLINE 2 SENSITIVE Sensitive     VANCOMYCIN  2 SENSITIVE Sensitive     TRIMETH/SULFA 160 RESISTANT Resistant     CLINDAMYCIN >=8 RESISTANT Resistant     RIFAMPIN <=0.5 SENSITIVE Sensitive     Inducible Clindamycin NEGATIVE Sensitive     * FEW STAPHYLOCOCCUS EPIDERMIDIS  Aerobic Culture w Gram Stain (superficial specimen)     Status: None   Collection Time: 06/22/24  8:43 AM   Specimen: Wound  Result Value Ref Range Status   Specimen Description WOUND  Final   Special Requests RIGHT FOOT  Final   Gram Stain   Final    MODERATE WBC PRESENT,BOTH PMN AND MONONUCLEAR MODERATE GRAM POSITIVE COCCI    Culture   Final    FEW ENTEROCOCCUS FAECALIS RARE DIPHTHEROIDS(CORYNEBACTERIUM SPECIES) Standardized susceptibility testing for this organism is not available. Performed at [facility]  Lab, [address], [place], [place] [address]    Report Status 06/25/2024 FINAL  Final   Organism ID, Bacteria ENTEROCOCCUS FAECALIS  Final      Susceptibility   Enterococcus faecalis - MIC*    AMPICILLIN  <=2 SENSITIVE Sensitive     VANCOMYCIN  2 SENSITIVE Sensitive     GENTAMICIN SYNERGY SENSITIVE  Sensitive     * FEW ENTEROCOCCUS FAECALIS     Time coordinating discharge: Over 30 minutes  SIGNED:   Darcel Dawley, MD  [facility] 06/26/2024, 4:02 PM Pager   If 7PM-7AM, please contact night-coverage

## 2024-06-27 NOTE — Progress Notes (Signed)
 [facility]  Date of Admission:  06/19/2024    Principal Problem:   Osteomyelitis of fifth toe of right foot [facility])          Assessment: 73 year old female with history of diabetes mellitus, PAD, status post left AKA, hypertension, chronic right foot ulcer now progressed to osteomyelitis with abscess. #Right fifth metatarsal osteomyelitis, MTP joint abscess - MRI on 9/5 showed findings consistent fifth metatarsal osteomyelitis, 1X1X 1.6 cm fluid collection along the plantar lateral aspect of fifth MTP joint concern for small abscess. -patient had been seen by orthopedics Dr. Harden who had recommended ABIs of right lower extremity, Doxy x 1 week follow-up in office as patient was not interested in surgical intervention. -Then podiatry was engaged who recommended amputation of fifth ray, patient is not amenable to that at that time. - ID was engaged to antibiotics were continued with daptomycin  and ceftriaxone  recommended bone biopsy - Given the patient has been on multiple antibiotics.  Podiatry with no plans for OR, given patient declined amputation.  Tissue biopsy obtained at bedside - Plan for tissue biopsy with podiatry grew E faecalis, corynebacterium species, staph epi Recommendations: -d/c Dapto and ceftriaxone  - Will transition to Doxy and Augmentin , Rx'd about 6 weeks of antibiotics with ID follow-up - Discussed with patient that antibiotics alone will unlikely be curative given abscess.  Per podiatry note there was fibropurulent drainage from wound that was malodorous as well.  Counseled oral antibiotics to bridge to either podiatry or orthopedic appointment in a couple weeks.   -ID will sign off  Microbiology:   Antibiotics: Dapto + ctx 9/8-p Cultures: Blood 9/5 Urine  Other   SUBJECTIVE: Resting in bed, husband at bedside Interval: Afebrile overnight.   Review of Systems: Review of Systems  All other systems reviewed and are  negative.    Scheduled Meds:   Continuous Infusions:   PRN Meds:. Allergies  Allergen Reactions   Other     States she is allergic to abx but does not know name    Amoxicillin  Diarrhea    OBJECTIVE: Vitals:   06/25/24 1741 06/25/24 2120 06/26/24 0525 06/26/24 0812  BP: (!) 140/62 (!) 147/70 (!) 121/53 120/61  Pulse:  84 76 73  Resp:  20 20 16   Temp:   97.9 F (36.6 C) 98 F (36.7 C)  TempSrc:   Oral Oral  SpO2:  95% 99% 97%  Weight:      Height:       Body mass index is 19.84 kg/m.  Physical Exam Constitutional:      Appearance: Normal appearance.  HENT:     Head: Normocephalic and atraumatic.     Right Ear: Tympanic membrane normal.     Left Ear: Tympanic membrane normal.     Nose: Nose normal.     Mouth/Throat:     Mouth: Mucous membranes are moist.  Eyes:     Extraocular Movements: Extraocular movements intact.     Conjunctiva/sclera: Conjunctivae normal.     Pupils: Pupils are equal, round, and reactive to light.  Cardiovascular:     Rate and Rhythm: Normal rate and regular rhythm.     Heart sounds: No murmur heard.    No friction rub. No gallop.  Pulmonary:     Effort: Pulmonary effort is normal.     Breath sounds: Normal breath sounds.  Abdominal:     General: Abdomen is flat.     Palpations: Abdomen  is soft.  Musculoskeletal:     Comments: Foot bandaged  Skin:    General: Skin is warm and dry.  Neurological:     General: No focal deficit present.     Mental Status: She is alert and oriented to person, place, and time.  Psychiatric:        Mood and Affect: Mood normal.       Lab Results Lab Results  Component Value Date   WBC 7.7 06/25/2024   HGB 8.7 (L) 06/25/2024   HCT 26.4 (L) 06/25/2024   MCV 86.0 06/25/2024   PLT 364 06/25/2024    Lab Results  Component Value Date   CREATININE 0.99 06/25/2024   BUN 28 (H) 06/25/2024   NA 136 06/25/2024   K 3.7 06/25/2024   CL 96 (L) 06/25/2024   CO2 25 06/25/2024    Lab Results   Component Value Date   ALT 15 06/23/2024   AST 18 06/23/2024   ALKPHOS 104 06/23/2024   BILITOT 0.4 06/23/2024        Loney Stank, MD [facility] 06/27/2024, 12:09 AM Evaluation of this patient requires complex antimicrobial therapy evaluation and counseling + isolation needs for disease transmission risk assessment and mitigation

## 2024-06-29 ENCOUNTER — Telehealth: Payer: Self-pay

## 2024-06-29 DIAGNOSIS — R112 Nausea with vomiting, unspecified: Secondary | ICD-10-CM

## 2024-06-29 DIAGNOSIS — M869 Osteomyelitis, unspecified: Secondary | ICD-10-CM

## 2024-06-29 MED ORDER — ONDANSETRON 4 MG PO TBDP: 4.0000 mg | ORAL_TABLET | Freq: Two times a day (BID) | ORAL | 2 refills | Status: AC | PRN

## 2024-06-29 NOTE — Telephone Encounter (Addendum)
 Call [place], but spoke with Jill Munoz, patient's husband and DPR. He states that the patient is currently unavailable, but he would like to take a message for her.   Both medications, Augmentin  and doxycycline  should be taken twice daily after a meal. She was previously only eating a piece of toast and a glass of milk for breakfast, counseled that this is not an optimal breakfast. Moving forward, ensure that she has a larger breakfast and avoid drinking milk while on antibiotics. Further educated on taking the medication with an 8 oz glass of water and remaining upright for at least an hour after taking them. She does take a daily multivitamin, ensured that she does not take with morning or evening dose of antibiotics. Recommended taking with lunch to avoid interactions.   If nausea does not resolve with administration changes, recommended use of antinausea medication. Will send in ondansetron  today. Instructed to take the medication 30 minutes to 1 hour prior to taking the antibiotics to avoid nausea.   Ensured that Jill understood the counseling points by having him teach back to me. He was able to retain the information well and accurately remembered how to appropriately take the antibiotics and antinausea medications.   He states that the patient has not had any follow-up from home health for wound care or podiatry. Provided him with appropriate contact numbers to follow-up.   Woodie Jock, PharmD PGY1 Pharmacy Resident  06/29/2024

## 2024-06-29 NOTE — Telephone Encounter (Signed)
 Spoke with patient today about medication side effects. Should be due for follow-up with Dr. Dennise in ~1 month. Could you all schedule? Thank you!   Alan Geralds, PharmD, CPP, BCIDP, AAHIVP Clinical Pharmacist Practitioner Infectious Diseases Clinical Pharmacist [facility]

## 2024-06-30 DIAGNOSIS — I130 Hypertensive heart and chronic kidney disease with heart failure and stage 1 through stage 4 chronic kidney disease, or unspecified chronic kidney disease: Secondary | ICD-10-CM | POA: Diagnosis not present

## 2024-06-30 DIAGNOSIS — Z556 Problems related to health literacy: Secondary | ICD-10-CM | POA: Diagnosis not present

## 2024-06-30 DIAGNOSIS — E11621 Type 2 diabetes mellitus with foot ulcer: Secondary | ICD-10-CM | POA: Diagnosis not present

## 2024-06-30 DIAGNOSIS — E1122 Type 2 diabetes mellitus with diabetic chronic kidney disease: Secondary | ICD-10-CM | POA: Diagnosis not present

## 2024-06-30 DIAGNOSIS — Z792 Long term (current) use of antibiotics: Secondary | ICD-10-CM | POA: Diagnosis not present

## 2024-06-30 DIAGNOSIS — D631 Anemia in chronic kidney disease: Secondary | ICD-10-CM | POA: Diagnosis not present

## 2024-06-30 DIAGNOSIS — E1165 Type 2 diabetes mellitus with hyperglycemia: Secondary | ICD-10-CM | POA: Diagnosis not present

## 2024-06-30 DIAGNOSIS — Z7984 Long term (current) use of oral hypoglycemic drugs: Secondary | ICD-10-CM | POA: Diagnosis not present

## 2024-06-30 DIAGNOSIS — D509 Iron deficiency anemia, unspecified: Secondary | ICD-10-CM | POA: Diagnosis not present

## 2024-06-30 DIAGNOSIS — N182 Chronic kidney disease, stage 2 (mild): Secondary | ICD-10-CM | POA: Diagnosis not present

## 2024-06-30 DIAGNOSIS — L97519 Non-pressure chronic ulcer of other part of right foot with unspecified severity: Secondary | ICD-10-CM | POA: Diagnosis not present

## 2024-06-30 DIAGNOSIS — E785 Hyperlipidemia, unspecified: Secondary | ICD-10-CM | POA: Diagnosis not present

## 2024-06-30 DIAGNOSIS — K219 Gastro-esophageal reflux disease without esophagitis: Secondary | ICD-10-CM | POA: Diagnosis not present

## 2024-06-30 DIAGNOSIS — E1151 Type 2 diabetes mellitus with diabetic peripheral angiopathy without gangrene: Secondary | ICD-10-CM | POA: Diagnosis not present

## 2024-06-30 DIAGNOSIS — M86171 Other acute osteomyelitis, right ankle and foot: Secondary | ICD-10-CM | POA: Diagnosis not present

## 2024-06-30 DIAGNOSIS — Z89612 Acquired absence of left leg above knee: Secondary | ICD-10-CM | POA: Diagnosis not present

## 2024-06-30 DIAGNOSIS — I5023 Acute on chronic systolic (congestive) heart failure: Secondary | ICD-10-CM | POA: Diagnosis not present

## 2024-06-30 DIAGNOSIS — E1169 Type 2 diabetes mellitus with other specified complication: Secondary | ICD-10-CM | POA: Diagnosis not present

## 2024-06-30 DIAGNOSIS — M869 Osteomyelitis, unspecified: Secondary | ICD-10-CM | POA: Diagnosis not present

## 2024-07-02 NOTE — Telephone Encounter (Signed)
 Received message from Laura Greeson that patient's husband called back with continued concerns with medication tolerability. Will notify ID pharmacists.

## 2024-07-03 ENCOUNTER — Telehealth: Payer: Self-pay

## 2024-07-03 ENCOUNTER — Other Ambulatory Visit (HOSPITAL_COMMUNITY): Payer: Self-pay

## 2024-07-03 DIAGNOSIS — E1122 Type 2 diabetes mellitus with diabetic chronic kidney disease: Secondary | ICD-10-CM | POA: Diagnosis not present

## 2024-07-03 DIAGNOSIS — D631 Anemia in chronic kidney disease: Secondary | ICD-10-CM | POA: Diagnosis not present

## 2024-07-03 DIAGNOSIS — I130 Hypertensive heart and chronic kidney disease with heart failure and stage 1 through stage 4 chronic kidney disease, or unspecified chronic kidney disease: Secondary | ICD-10-CM | POA: Diagnosis not present

## 2024-07-03 DIAGNOSIS — I5023 Acute on chronic systolic (congestive) heart failure: Secondary | ICD-10-CM | POA: Diagnosis not present

## 2024-07-03 DIAGNOSIS — E1165 Type 2 diabetes mellitus with hyperglycemia: Secondary | ICD-10-CM | POA: Diagnosis not present

## 2024-07-03 DIAGNOSIS — D509 Iron deficiency anemia, unspecified: Secondary | ICD-10-CM | POA: Diagnosis not present

## 2024-07-03 DIAGNOSIS — K219 Gastro-esophageal reflux disease without esophagitis: Secondary | ICD-10-CM | POA: Diagnosis not present

## 2024-07-03 DIAGNOSIS — M86171 Other acute osteomyelitis, right ankle and foot: Secondary | ICD-10-CM | POA: Diagnosis not present

## 2024-07-03 DIAGNOSIS — M869 Osteomyelitis, unspecified: Secondary | ICD-10-CM | POA: Diagnosis not present

## 2024-07-03 DIAGNOSIS — E11621 Type 2 diabetes mellitus with foot ulcer: Secondary | ICD-10-CM | POA: Diagnosis not present

## 2024-07-03 DIAGNOSIS — E785 Hyperlipidemia, unspecified: Secondary | ICD-10-CM | POA: Diagnosis not present

## 2024-07-03 DIAGNOSIS — N182 Chronic kidney disease, stage 2 (mild): Secondary | ICD-10-CM | POA: Diagnosis not present

## 2024-07-03 DIAGNOSIS — Z556 Problems related to health literacy: Secondary | ICD-10-CM | POA: Diagnosis not present

## 2024-07-03 DIAGNOSIS — Z89612 Acquired absence of left leg above knee: Secondary | ICD-10-CM | POA: Diagnosis not present

## 2024-07-03 DIAGNOSIS — Z792 Long term (current) use of antibiotics: Secondary | ICD-10-CM | POA: Diagnosis not present

## 2024-07-03 DIAGNOSIS — E1169 Type 2 diabetes mellitus with other specified complication: Secondary | ICD-10-CM | POA: Diagnosis not present

## 2024-07-03 DIAGNOSIS — E1151 Type 2 diabetes mellitus with diabetic peripheral angiopathy without gangrene: Secondary | ICD-10-CM | POA: Diagnosis not present

## 2024-07-03 DIAGNOSIS — Z7984 Long term (current) use of oral hypoglycemic drugs: Secondary | ICD-10-CM | POA: Diagnosis not present

## 2024-07-03 DIAGNOSIS — L97519 Non-pressure chronic ulcer of other part of right foot with unspecified severity: Secondary | ICD-10-CM | POA: Diagnosis not present

## 2024-07-03 MED ORDER — METRONIDAZOLE 500 MG PO TABS: 500.0000 mg | ORAL_TABLET | Freq: Two times a day (BID) | ORAL | 0 refills | Status: DC

## 2024-07-03 MED ORDER — LINEZOLID 600 MG PO TABS: 600.0000 mg | ORAL_TABLET | Freq: Two times a day (BID) | ORAL | 0 refills | Status: AC

## 2024-07-03 NOTE — Telephone Encounter (Addendum)
 Consulted Dr. Dennise, the ID provider that followed Jill Munoz during her hospitalization for updated antibiotic recommendations since the patient is not not tolerating Augmentin  due to nausea. Dr. Dennise recommended stopping doxycycline  and Augmentin  and starting linezolid  and metronidazole  for two weeks.   Attempted to call patient's mobile and home phone number twice but was unable to reach. Left a HIPAA-compliant voicemail on the home phone number; the mobile number's voicemail was full. Provided a direct call-back number.  Woodie Jock, PharmD PGY1 Pharmacy Resident  07/03/2024

## 2024-07-03 NOTE — Telephone Encounter (Signed)
 Thank you for reaching out! We have messaged the ID providers to see about switching antibiotic therapy today.

## 2024-07-03 NOTE — Telephone Encounter (Signed)
 Yes, we sent MyChart to both Dr. Dennise and Dr. Dea. Dr. Dea is deferring any changes to Dr. Dennise - will need to adjust antibiotics given side effects and unwillingness to continue Augmentin . Thank you!

## 2024-07-03 NOTE — Addendum Note (Signed)
 Addended by: BERNETTE FALLING on: 07/03/2024 12:47 PM   Modules accepted: Orders

## 2024-07-03 NOTE — Telephone Encounter (Signed)
 Received call from patient's husband, he states that about 15 minutes after she takes her antibiotics she begins throwing up. She has tried taking ondansetron  before her antibiotics but this has not helped. Her husband is asking if there are any other abx she can be switched to.   Jill Munoz, BSN, RN

## 2024-07-03 NOTE — Addendum Note (Signed)
 Addended by: BERNETTE FALLING on: 07/03/2024 12:49 PM   Modules accepted: Orders

## 2024-07-07 ENCOUNTER — Telehealth: Payer: Self-pay | Admitting: Pharmacist

## 2024-07-07 NOTE — Telephone Encounter (Signed)
 See telephone note from 9/19 for more context. Spoke with patient's husband over the phone today confirming they had picked up the linezolid  and metronidazole  to start x 2 weeks. They had not started them yet waiting to hear back from us  even though we called on Friday and left a voicemail. They will resume antibiotics today. Sounds like there would have been ~4-5 days without antibiotic coverage. Seeing you on Thursday.  Alan Geralds, PharmD, CPP, BCIDP, AAHIVP Clinical Pharmacist Practitioner Infectious Diseases Clinical Pharmacist [facility]

## 2024-07-09 ENCOUNTER — Ambulatory Visit: Admitting: Infectious Diseases

## 2024-07-09 DIAGNOSIS — K219 Gastro-esophageal reflux disease without esophagitis: Secondary | ICD-10-CM | POA: Diagnosis not present

## 2024-07-09 DIAGNOSIS — M869 Osteomyelitis, unspecified: Secondary | ICD-10-CM | POA: Diagnosis not present

## 2024-07-09 DIAGNOSIS — Z7984 Long term (current) use of oral hypoglycemic drugs: Secondary | ICD-10-CM | POA: Diagnosis not present

## 2024-07-09 DIAGNOSIS — I5023 Acute on chronic systolic (congestive) heart failure: Secondary | ICD-10-CM | POA: Diagnosis not present

## 2024-07-09 DIAGNOSIS — E1151 Type 2 diabetes mellitus with diabetic peripheral angiopathy without gangrene: Secondary | ICD-10-CM | POA: Diagnosis not present

## 2024-07-09 DIAGNOSIS — E1169 Type 2 diabetes mellitus with other specified complication: Secondary | ICD-10-CM | POA: Diagnosis not present

## 2024-07-09 DIAGNOSIS — N182 Chronic kidney disease, stage 2 (mild): Secondary | ICD-10-CM | POA: Diagnosis not present

## 2024-07-09 DIAGNOSIS — Z556 Problems related to health literacy: Secondary | ICD-10-CM | POA: Diagnosis not present

## 2024-07-09 DIAGNOSIS — L97519 Non-pressure chronic ulcer of other part of right foot with unspecified severity: Secondary | ICD-10-CM | POA: Diagnosis not present

## 2024-07-09 DIAGNOSIS — D631 Anemia in chronic kidney disease: Secondary | ICD-10-CM | POA: Diagnosis not present

## 2024-07-09 DIAGNOSIS — E1165 Type 2 diabetes mellitus with hyperglycemia: Secondary | ICD-10-CM | POA: Diagnosis not present

## 2024-07-09 DIAGNOSIS — M86171 Other acute osteomyelitis, right ankle and foot: Secondary | ICD-10-CM | POA: Diagnosis not present

## 2024-07-09 DIAGNOSIS — E11621 Type 2 diabetes mellitus with foot ulcer: Secondary | ICD-10-CM | POA: Diagnosis not present

## 2024-07-09 DIAGNOSIS — I130 Hypertensive heart and chronic kidney disease with heart failure and stage 1 through stage 4 chronic kidney disease, or unspecified chronic kidney disease: Secondary | ICD-10-CM | POA: Diagnosis not present

## 2024-07-09 DIAGNOSIS — E785 Hyperlipidemia, unspecified: Secondary | ICD-10-CM | POA: Diagnosis not present

## 2024-07-09 DIAGNOSIS — Z89612 Acquired absence of left leg above knee: Secondary | ICD-10-CM | POA: Diagnosis not present

## 2024-07-09 DIAGNOSIS — D509 Iron deficiency anemia, unspecified: Secondary | ICD-10-CM | POA: Diagnosis not present

## 2024-07-09 DIAGNOSIS — Z792 Long term (current) use of antibiotics: Secondary | ICD-10-CM | POA: Diagnosis not present

## 2024-07-09 DIAGNOSIS — E1122 Type 2 diabetes mellitus with diabetic chronic kidney disease: Secondary | ICD-10-CM | POA: Diagnosis not present

## 2024-07-10 ENCOUNTER — Telehealth: Payer: Self-pay

## 2024-07-10 DIAGNOSIS — M869 Osteomyelitis, unspecified: Secondary | ICD-10-CM | POA: Diagnosis not present

## 2024-07-10 DIAGNOSIS — I5020 Unspecified systolic (congestive) heart failure: Secondary | ICD-10-CM | POA: Diagnosis not present

## 2024-07-10 DIAGNOSIS — N1831 Chronic kidney disease, stage 3a: Secondary | ICD-10-CM | POA: Diagnosis not present

## 2024-07-10 DIAGNOSIS — E1140 Type 2 diabetes mellitus with diabetic neuropathy, unspecified: Secondary | ICD-10-CM | POA: Diagnosis not present

## 2024-07-10 DIAGNOSIS — D509 Iron deficiency anemia, unspecified: Secondary | ICD-10-CM | POA: Diagnosis not present

## 2024-07-10 NOTE — Telephone Encounter (Signed)
 Notified by front desk that patient walked in requesting to be seen. They cancelled their appointment that was scheduled for yesterday in order to see their PCP. Their PCP advised them they needed to be seen by ID immediately.   Dr. Dea unable to see patient this morning and advises that they should reschedule or go to the emergency department.   Front desk has rescheduled patient for 9/29.  Maribell Demeo, BSN, RN

## 2024-07-13 ENCOUNTER — Telehealth: Payer: Self-pay | Admitting: Internal Medicine

## 2024-07-13 ENCOUNTER — Telehealth: Payer: Self-pay

## 2024-07-13 ENCOUNTER — Inpatient Hospital Stay: Admitting: Internal Medicine

## 2024-07-13 DIAGNOSIS — M869 Osteomyelitis, unspecified: Secondary | ICD-10-CM

## 2024-07-13 NOTE — Telephone Encounter (Signed)
 Thank you :)

## 2024-07-13 NOTE — Progress Notes (Deleted)
 Patient: Jill Munoz  DOB: [birth year] MRN: [id number] PCP: Ransom Other, MD  Referring Provider: ***  No chief complaint on file.    Patient Active Problem List   Diagnosis Date Noted   Osteomyelitis of fifth toe of right foot (HCC) 06/20/2024   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 (HFmrEF, 41-49%) (HCC) 02/09/2024   Acute on chronic anemia 02/09/2024   Leukopenia 02/09/2024   Non-insulin  dependent type 2 diabetes mellitus (HCC) 02/09/2024   Acute respiratory failure with hypoxia (HCC) 02/09/2024   GERD without esophagitis 08/22/2022   Amputee, above knee, left (HCC) 05/11/2022   Anemia of chronic disease 05/11/2022   Constipation 05/11/2022   Kyphosis of thoracic region 05/11/2022   Blurry vision, bilateral 05/11/2022   Hypertension associated with diabetes (HCC) 05/11/2022   Closed compression fracture of thoracic vertebra (HCC) 05/11/2022   Weakness    Hypokalemia 04/10/2022   Bacteremia due to Streptococcus Pyogenes 04/03/2022   Type 2 diabetes mellitus with hyperglycemia (HCC) 04/03/2022   Beta-hemolytic group A streptococcal sepsis (HCC)    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 @GENDER @ with   ROS  Past Medical History:  Diagnosis Date   Anemia    Diabetes mellitus without complication (HCC)    Pre-diabetes     Outpatient Medications Prior to Visit  Medication Sig Dispense Refill   Ascorbic Acid  (VITAMIN C) 1000 MG tablet Take 1,000 mg by mouth daily.     Berberine Chloride 500 MG CAPS Take 1 capsule by mouth in the morning, at noon, and at bedtime.     dorzolamide -timolol  (COSOPT ) 22.3-6.8 MG/ML ophthalmic solution Place 1 drop into both eyes 2 (two) times daily. 10 mL 12   furosemide  (LASIX ) 20 MG tablet Take Lasix  20 mg daily. You may take an addition Lasix  20 mg daily (for a total of 40 mg) as needed for  swelling. 90 tablet 1   glucose blood (ACCU-CHEK GUIDE) test strip Use as instructed once a day 100 each 3   JARDIANCE  10 MG TABS tablet Take 10 mg by mouth daily.     linezolid  (ZYVOX ) 600 MG tablet Take 1 tablet (600 mg total) by mouth 2 (two) times daily. 28 tablet 0   losartan  (COZAAR ) 50 MG tablet Take 50 mg by mouth daily.     melatonin 3 MG TABS tablet Take 3 mg by mouth at bedtime.     metroNIDAZOLE  (FLAGYL ) 500 MG tablet Take 1 tablet (500 mg total) by mouth 2 (two) times daily. 28 tablet 0   nystatin  (MYCOSTATIN /NYSTOP ) powder Apply 1 Application topically 3 (three) times daily. 15 g 0   omeprazole (PRILOSEC) 40 MG capsule Take 60 mg by mouth in the morning and at bedtime.     ondansetron  (ZOFRAN -ODT) 4 MG disintegrating tablet Take 1 tablet (4 mg total) by mouth 2 (two) times daily as needed for nausea or vomiting. 30 tablet 2   promethazine  (PHENERGAN ) 12.5 MG tablet Take 12.5 mg by mouth every 6 (six) hours as needed for vomiting.     rosuvastatin  (CRESTOR ) 20 MG tablet Take 1 tablet (20 mg total) by mouth at bedtime. 90 tablet 3   spironolactone  (ALDACTONE ) 25 MG tablet Take 1 tablet (25 mg total) by mouth daily. 90 tablet 3   traMADol  (ULTRAM ) 50 MG tablet Take 50 mg by mouth every 6 (six) hours as needed for moderate  pain (pain score 4-6).     No facility-administered medications prior to visit.     Allergies  Allergen Reactions   Other     States she is allergic to abx but does not know name    Amoxicillin  Diarrhea    Social History   Tobacco Use   Smoking status: Never   Smokeless tobacco: Never  Vaping Use   Vaping status: Never Used  Substance Use Topics   Alcohol  use: Never   Drug use: Never    Family History  Problem Relation Age of Onset   Heart attack Mother    Heart disease Mother     Objective:  There were no vitals filed for this visit. There is no height or weight on file to calculate BMI.  Physical Exam  Lab Results: Lab Results   Component Value Date   WBC 7.7 06/25/2024   HGB 8.7 (L) 06/25/2024   HCT 26.4 (L) 06/25/2024   MCV 86.0 06/25/2024   PLT 364 06/25/2024    Lab Results  Component Value Date   CREATININE 0.99 06/25/2024   BUN 28 (H) 06/25/2024   NA 136 06/25/2024   K 3.7 06/25/2024   CL 96 (L) 06/25/2024   CO2 25 06/25/2024    Lab Results  Component Value Date   ALT 15 06/23/2024   AST 18 06/23/2024   ALKPHOS 104 06/23/2024   BILITOT 0.4 06/23/2024     Assessment & Plan:   Eot 10/19 Loney Stank, MD [facility]   07/13/24  6:13 AM

## 2024-07-13 NOTE — Telephone Encounter (Signed)
 Called and spoke to pt's husband(HIPA contact). Informed appt tomorrow with ID. Urgent referral to podiatry as well givne osteo/abscess and initial plan for f/u with either podiatry or ortho

## 2024-07-13 NOTE — Telephone Encounter (Signed)
 Patient called to confirm appointment. Patient made aware of missed appointment this AM. Patient states he was not aware of appointment time/date while front desk staff confirm this appointment was made in person last week and patient confirmed.   Patient is rescheduled x3 for provider visit tomorrow.  Provider will also give patient a call today to touch base.

## 2024-07-14 ENCOUNTER — Telehealth: Payer: Self-pay

## 2024-07-14 ENCOUNTER — Encounter: Payer: Self-pay | Admitting: Internal Medicine

## 2024-07-14 ENCOUNTER — Ambulatory Visit (INDEPENDENT_AMBULATORY_CARE_PROVIDER_SITE_OTHER): Admitting: Internal Medicine

## 2024-07-14 ENCOUNTER — Other Ambulatory Visit: Payer: Self-pay

## 2024-07-14 VITALS — BP 147/85 | HR 76 | Temp 98.4°F

## 2024-07-14 DIAGNOSIS — K219 Gastro-esophageal reflux disease without esophagitis: Secondary | ICD-10-CM | POA: Diagnosis not present

## 2024-07-14 DIAGNOSIS — Z89612 Acquired absence of left leg above knee: Secondary | ICD-10-CM | POA: Diagnosis not present

## 2024-07-14 DIAGNOSIS — E1169 Type 2 diabetes mellitus with other specified complication: Secondary | ICD-10-CM | POA: Diagnosis not present

## 2024-07-14 DIAGNOSIS — E1151 Type 2 diabetes mellitus with diabetic peripheral angiopathy without gangrene: Secondary | ICD-10-CM | POA: Diagnosis not present

## 2024-07-14 DIAGNOSIS — M86171 Other acute osteomyelitis, right ankle and foot: Secondary | ICD-10-CM | POA: Diagnosis not present

## 2024-07-14 DIAGNOSIS — L97519 Non-pressure chronic ulcer of other part of right foot with unspecified severity: Secondary | ICD-10-CM | POA: Diagnosis not present

## 2024-07-14 DIAGNOSIS — E1165 Type 2 diabetes mellitus with hyperglycemia: Secondary | ICD-10-CM | POA: Diagnosis not present

## 2024-07-14 DIAGNOSIS — M869 Osteomyelitis, unspecified: Secondary | ICD-10-CM

## 2024-07-14 DIAGNOSIS — D509 Iron deficiency anemia, unspecified: Secondary | ICD-10-CM | POA: Diagnosis not present

## 2024-07-14 DIAGNOSIS — E1122 Type 2 diabetes mellitus with diabetic chronic kidney disease: Secondary | ICD-10-CM | POA: Diagnosis not present

## 2024-07-14 DIAGNOSIS — D631 Anemia in chronic kidney disease: Secondary | ICD-10-CM | POA: Diagnosis not present

## 2024-07-14 DIAGNOSIS — I5023 Acute on chronic systolic (congestive) heart failure: Secondary | ICD-10-CM | POA: Diagnosis not present

## 2024-07-14 DIAGNOSIS — Z7984 Long term (current) use of oral hypoglycemic drugs: Secondary | ICD-10-CM | POA: Diagnosis not present

## 2024-07-14 DIAGNOSIS — E11621 Type 2 diabetes mellitus with foot ulcer: Secondary | ICD-10-CM | POA: Diagnosis not present

## 2024-07-14 DIAGNOSIS — Z792 Long term (current) use of antibiotics: Secondary | ICD-10-CM | POA: Diagnosis not present

## 2024-07-14 DIAGNOSIS — N182 Chronic kidney disease, stage 2 (mild): Secondary | ICD-10-CM | POA: Diagnosis not present

## 2024-07-14 DIAGNOSIS — I130 Hypertensive heart and chronic kidney disease with heart failure and stage 1 through stage 4 chronic kidney disease, or unspecified chronic kidney disease: Secondary | ICD-10-CM | POA: Diagnosis not present

## 2024-07-14 DIAGNOSIS — Z556 Problems related to health literacy: Secondary | ICD-10-CM | POA: Diagnosis not present

## 2024-07-14 DIAGNOSIS — E785 Hyperlipidemia, unspecified: Secondary | ICD-10-CM | POA: Diagnosis not present

## 2024-07-14 MED ORDER — METRONIDAZOLE 500 MG PO TABS: 500.0000 mg | ORAL_TABLET | Freq: Two times a day (BID) | ORAL | 0 refills | Status: AC

## 2024-07-14 NOTE — Progress Notes (Unsigned)
 Patient Active Problem List   Diagnosis Date Noted   Osteomyelitis of fifth toe of right foot (HCC) 06/20/2024   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 (HFmrEF, 41-49%) (HCC) 02/09/2024   Acute on chronic anemia 02/09/2024   Leukopenia 02/09/2024   Non-insulin  dependent type 2 diabetes mellitus (HCC) 02/09/2024   Acute respiratory failure with hypoxia (HCC) 02/09/2024   GERD without esophagitis 08/22/2022   Amputee, above knee, left (HCC) 05/11/2022   Anemia of chronic disease 05/11/2022   Constipation 05/11/2022   Kyphosis of thoracic region 05/11/2022   Blurry vision, bilateral 05/11/2022   Hypertension associated with diabetes (HCC) 05/11/2022   Closed compression fracture of thoracic vertebra (HCC) 05/11/2022   Weakness    Hypokalemia 04/10/2022   Bacteremia due to Streptococcus Pyogenes 04/03/2022   Type 2 diabetes mellitus with hyperglycemia (HCC) 04/03/2022   Beta-hemolytic group A streptococcal sepsis (HCC)    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   BERBERINE CHLORIDE 500 MG CAPS    Take 1 capsule by mouth in the morning, at noon, and at bedtime.   DORZOLAMIDE -TIMOLOL  (COSOPT ) 22.3-6.8 MG/ML OPHTHALMIC SOLUTION    Place 1 drop into both eyes 2 (two) times daily.   FUROSEMIDE  (LASIX ) 20 MG TABLET    Take Lasix  20 mg daily. You may take an addition Lasix  20 mg daily (for a total of 40 mg) as needed for swelling.   GLUCOSE BLOOD (ACCU-CHEK GUIDE) TEST STRIP    Use as instructed once a day   JARDIANCE  10 MG TABS TABLET    Take 10 mg by mouth daily.   LINEZOLID  (ZYVOX ) 600 MG TABLET    Take 1 tablet (600 mg total) by mouth 2 (two) times daily.   LOSARTAN  (COZAAR ) 50 MG TABLET    Take 50 mg by mouth daily.    MELATONIN 3 MG TABS TABLET    Take 3 mg by mouth at bedtime.   METRONIDAZOLE  (FLAGYL ) 500 MG TABLET    Take 1 tablet (500 mg total) by mouth 2 (two) times daily.   NYSTATIN  (MYCOSTATIN /NYSTOP ) POWDER    Apply 1 Application topically 3 (three) times daily.   OMEPRAZOLE (PRILOSEC) 40 MG CAPSULE    Take 60 mg by mouth in the morning and at bedtime.   ONDANSETRON  (ZOFRAN -ODT) 4 MG DISINTEGRATING TABLET    Take 1 tablet (4 mg total) by mouth 2 (two) times daily as needed for nausea or vomiting.   PROMETHAZINE  (PHENERGAN ) 12.5 MG TABLET    Take 12.5 mg by mouth every 6 (six) hours as needed for vomiting.   ROSUVASTATIN  (CRESTOR ) 20 MG TABLET    Take 1 tablet (20 mg total) by mouth at bedtime.   SPIRONOLACTONE  (ALDACTONE ) 25 MG TABLET    Take 1 tablet (25 mg total) by mouth daily.   TRAMADOL  (ULTRAM ) 50 MG TABLET    Take 50 mg by mouth every 6 (six) hours as needed for moderate pain (pain score 4-6).  Modified Medications   No medications on file  Discontinued Medications   No medications on file    Subjective: 73 year old female with diabetes, PAD, AKA left, hypertension, chronic right foot ulcer which progressed to osteomyelitis with abscess presents for  hospital follow-up.MRI on 9/5 showed findings consistent fifth metatarsal osteomyelitis, 1X1X 1.6 cm fluid collection along the plantar lateral aspect of fifth MTP joint concern for small abscess.  Podiatry was engaged who recommended amputation of fifth digit, patient was not amenable to it at that point.  ID was engaged and recommended biopsy.  Tissue biopsy grew E faecalis, corynebacterium species and staph epi patient discharged on Doxy and Augmentin  x 6 weeks with ID follow-up.  I discussed with the patient that antibiotic course will likely count curative.  Per podiatry note there was further purulent drainage of wound with malodor.  She was counseled to follow-up with podiatry or orthopedic in a couple weeks.  Today: Patient was switched from  Augmentin  to linezolid  and metronidazole  given she had intolerance to Augmentin .  Husband at bedside says that she for couple days she has been taking linezolid  once a day.  She has not seen either orthopedics or podiatry yet.  Denies fevers or chills.   Review of Systems: Review of Systems  All other systems reviewed and are negative.   Past Medical History:  Diagnosis Date   Anemia    Diabetes mellitus without complication (HCC)    Pre-diabetes     Social History   Tobacco Use   Smoking status: Never   Smokeless tobacco: Never  Vaping Use   Vaping status: Never Used  Substance Use Topics   Alcohol  use: Never   Drug use: Never    Family History  Problem Relation Age of Onset   Heart attack Mother    Heart disease Mother     Allergies  Allergen Reactions   Other     States she is allergic to abx but does not know name    Amoxicillin  Diarrhea    Health Maintenance  Topic Date Due   COVID-19 Vaccine (1) Never done   FOOT EXAM  Never done   Diabetic kidney evaluation - Urine ACR  Never done   Hepatitis C Screening  Never done   DTaP/Tdap/Td (1 - Tdap) Never done   Pneumococcal Vaccine: 50+ Years (1 of 2 - PCV) Never done   Zoster Vaccines- Shingrix (1 of 2) Never done   Mammogram  Never done   Colonoscopy  Never done   DEXA SCAN  Never done   Influenza Vaccine  Never done   Medicare Annual Wellness (AWV)  10/13/2024   HEMOGLOBIN A1C  12/18/2024   OPHTHALMOLOGY EXAM  06/01/2025   Diabetic kidney evaluation - eGFR measurement  06/25/2025   HPV VACCINES  Aged Out   Meningococcal B Vaccine  Aged Out    Objective:  There were no vitals filed for this visit. There is no height or weight on file to calculate BMI.  Physical Exam Constitutional:      Appearance: Normal appearance.  HENT:     Head: Normocephalic and atraumatic.     Right Ear: Tympanic membrane normal.     Left Ear: Tympanic membrane normal.     Nose: Nose normal.     Mouth/Throat:      Mouth: Mucous membranes are moist.  Eyes:     Extraocular Movements: Extraocular movements intact.     Conjunctiva/sclera: Conjunctivae normal.     Pupils: Pupils are equal, round, and reactive to light.  Cardiovascular:     Rate and Rhythm: Normal rate and regular rhythm.     Heart sounds: No murmur heard.    No friction rub. No gallop.  Pulmonary:  Effort: Pulmonary effort is normal.     Breath sounds: Normal breath sounds.  Abdominal:     General: Abdomen is flat.     Palpations: Abdomen is soft.  Skin:    General: Skin is warm and dry.  Neurological:     General: No focal deficit present.     Mental Status: She is alert and oriented to person, place, and time.  Psychiatric:        Mood and Affect: Mood normal.    Lab Results Lab Results  Component Value Date   WBC 7.7 06/25/2024   HGB 8.7 (L) 06/25/2024   HCT 26.4 (L) 06/25/2024   MCV 86.0 06/25/2024   PLT 364 06/25/2024    Lab Results  Component Value Date   CREATININE 0.99 06/25/2024   BUN 28 (H) 06/25/2024   NA 136 06/25/2024   K 3.7 06/25/2024   CL 96 (L) 06/25/2024   CO2 25 06/25/2024    Lab Results  Component Value Date   ALT 15 06/23/2024   AST 18 06/23/2024   ALKPHOS 104 06/23/2024   BILITOT 0.4 06/23/2024    Lab Results  Component Value Date   CHOL 148 06/20/2024   HDL 44 06/20/2024   LDLCALC 82 06/20/2024   TRIG 112 06/20/2024   CHOLHDL 3.4 06/20/2024   No results found for: LABRPR, RPRTITER No results found for: HIV1RNAQUANT, HIV1RNAVL, CD4TABS   Problem List Items Addressed This Visit   None  Results   Assessment/Plan #Right fifth metatarsal osteomyelitis, MTP joint abscess - Tissue biopsy grew E faecalis, corynebacterium, staph epi - Patient initially discharged Doxy and Augmentin  for plan for either podiatry or Ortho follow-up.  She had declined amputation during hospitalization with podiatry. - She had intolerance to Augmentin  so she switched to linezolid  and  metronidazole .  Discussed with patient linezolid  twice a day.  Will change linezolid  to Dalvance x 1 and metronidazole  to complete 6 weeks antibiotics EOT 10/19.  Labs today.  Again I discussed with patient and husband bedside that antibiotics alone unlikely to be curative and recommend podiatry/orthopedic follow-up. - Follow-up with ID on 10/21. - Urgently referred to podiatry, for possible surgical intervention.      Loney Stank, MD [facility] 07/14/2024, 3:36 PM   I have personally spent 42 minutes involved in face-to-face and non-face-to-face activities for this patient on the day of the visit. Professional time spent includes the following activities: Preparing to see the patient (review of tests), Obtaining and/or reviewing separately obtained history (admission/discharge record), Performing a medically appropriate examination and/or evaluation , Ordering medications/tests/procedures, referring and communicating with other health care professionals, Documenting clinical information in the EMR, Independently interpreting results (not separately reported), Communicating results to the patient/family/caregiver, Counseling and educating the patient/family/caregiver and Care coordination (not separately reported).

## 2024-07-14 NOTE — Patient Instructions (Addendum)
 07/16/2024 3:00 PM (Arrive by 2:45 PM) Jill Munoz, DPM [facility]    ID: - Continue linezolid  and metronidazole  twice a day till Dalvance infusion.  Following Dalvance infusion, continue metronidazole   twice a day to complete total 6 weeks of antibiotics end of therapy 10/19 - Podiatry appointment on 10/2 - Follow-up with infectious disease in 3 weeks

## 2024-07-14 NOTE — Telephone Encounter (Signed)
 Per Dr. Dennise patient will need dalvance infusion x1. Secure chat send to [facility] to setup appt. Pts spouse would like call once appt is scheduled.  Lorenda CHRISTELLA Code, RMA

## 2024-07-15 ENCOUNTER — Telehealth (HOSPITAL_COMMUNITY): Payer: Self-pay | Admitting: Pharmacy Technician

## 2024-07-15 ENCOUNTER — Other Ambulatory Visit (HOSPITAL_COMMUNITY): Payer: Self-pay | Admitting: Internal Medicine

## 2024-07-15 LAB — COMPLETE METABOLIC PANEL WITHOUT GFR
AG Ratio: 1.2 (calc) (ref 1.0–2.5)
ALT: 16 U/L (ref 6–29)
AST: 18 U/L (ref 10–35)
Albumin: 3.8 g/dL (ref 3.6–5.1)
Alkaline phosphatase (APISO): 112 U/L (ref 37–153)
BUN: 23 mg/dL (ref 7–25)
CO2: 28 mmol/L (ref 20–32)
Calcium: 9.4 mg/dL (ref 8.6–10.4)
Chloride: 99 mmol/L (ref 98–110)
Creat: 0.96 mg/dL (ref 0.60–1.00)
Globulin: 3.3 g/dL (ref 1.9–3.7)
Glucose, Bld: 168 mg/dL — ABNORMAL HIGH (ref 65–99)
Potassium: 4.2 mmol/L (ref 3.5–5.3)
Sodium: 136 mmol/L (ref 135–146)
Total Bilirubin: 0.2 mg/dL (ref 0.2–1.2)
Total Protein: 7.1 g/dL (ref 6.1–8.1)

## 2024-07-15 LAB — CBC WITH DIFFERENTIAL/PLATELET
Absolute Lymphocytes: 1568 {cells}/uL (ref 850–3900)
Absolute Monocytes: 427 {cells}/uL (ref 200–950)
Basophils Absolute: 31 {cells}/uL (ref 0–200)
Basophils Relative: 0.5 %
Eosinophils Absolute: 220 {cells}/uL (ref 15–500)
Eosinophils Relative: 3.6 %
HCT: 29.6 % — ABNORMAL LOW (ref 35.0–45.0)
Hemoglobin: 9.4 g/dL — ABNORMAL LOW (ref 11.7–15.5)
MCH: 28.7 pg (ref 27.0–33.0)
MCHC: 31.8 g/dL — ABNORMAL LOW (ref 32.0–36.0)
MCV: 90.5 fL (ref 80.0–100.0)
MPV: 8.8 fL (ref 7.5–12.5)
Monocytes Relative: 7 %
Neutro Abs: 3855 {cells}/uL (ref 1500–7800)
Neutrophils Relative %: 63.2 %
Platelets: 403 Thousand/uL — ABNORMAL HIGH (ref 140–400)
RBC: 3.27 Million/uL — ABNORMAL LOW (ref 3.80–5.10)
RDW: 13.3 % (ref 11.0–15.0)
Total Lymphocyte: 25.7 %
WBC: 6.1 Thousand/uL (ref 3.8–10.8)

## 2024-07-15 LAB — SEDIMENTATION RATE: Sed Rate: 67 mm/h — ABNORMAL HIGH (ref 0–30)

## 2024-07-15 LAB — C-REACTIVE PROTEIN: CRP: 3.6 mg/L (ref <8.0)

## 2024-07-15 NOTE — Telephone Encounter (Signed)
 Auth Submission: NO AUTH NEEDED Site of care: [facility] Payer: [facility] MEDICARE Medication & CPT/J Code(s) submitted: DALVANCE [id number] Diagnosis Code: M86.9 Route of submission (phone, fax, portal):  Phone # Fax # Auth type: Buy/Bill HB Units/visits requested: 1500MG  X 1 Reference number: [id number] Approval from: 07/15/24 to 10/14/24    Dagoberto Armour, CPhT [facility] Phone: [phone] 07/15/2024

## 2024-07-16 ENCOUNTER — Ambulatory Visit: Admitting: Podiatry

## 2024-07-16 NOTE — Telephone Encounter (Signed)
 Attempted to speak with pt/ spouse regarding infusion appt. Left voicemail with date/time and address. Will send mychart message as well. Lorenda CHRISTELLA Code, RMA

## 2024-07-17 ENCOUNTER — Encounter (HOSPITAL_COMMUNITY): Payer: Self-pay | Admitting: Internal Medicine

## 2024-07-17 ENCOUNTER — Ambulatory Visit: Admitting: Cardiology

## 2024-07-21 ENCOUNTER — Ambulatory Visit (HOSPITAL_COMMUNITY)
Admission: RE | Admit: 2024-07-21 | Discharge: 2024-07-21 | Disposition: A | Discharge status: Home / Self Care | Source: Ambulatory Visit | Attending: Internal Medicine | Admitting: Internal Medicine

## 2024-07-21 ENCOUNTER — Encounter (HOSPITAL_COMMUNITY): Payer: Self-pay | Admitting: Internal Medicine

## 2024-07-21 VITALS — BP 186/94 | HR 81 | Temp 97.0°F | Resp 16

## 2024-07-21 DIAGNOSIS — M869 Osteomyelitis, unspecified: Secondary | ICD-10-CM | POA: Insufficient documentation

## 2024-07-21 MED ORDER — DEXTROSE 5 % IV BOLUS: 30.0000 mL | Freq: Once | INTRAVENOUS | Status: DC

## 2024-07-21 MED ORDER — DEXTROSE 5 % IV SOLN
1500.0000 mg | Freq: Once | INTRAVENOUS | Status: AC
  Administered 2024-07-21: 1500 mg via INTRAVENOUS
  Filled 2024-07-21: qty 75

## 2024-07-23 ENCOUNTER — Ambulatory Visit: Attending: Cardiology | Admitting: Cardiology

## 2024-07-23 ENCOUNTER — Encounter: Payer: Self-pay | Admitting: Cardiology

## 2024-07-23 VITALS — BP 114/78 | HR 83 | Ht 61.0 in | Wt 105.0 lb

## 2024-07-23 DIAGNOSIS — I700 Atherosclerosis of aorta: Secondary | ICD-10-CM | POA: Diagnosis not present

## 2024-07-23 DIAGNOSIS — Z89612 Acquired absence of left leg above knee: Secondary | ICD-10-CM

## 2024-07-23 DIAGNOSIS — I2510 Atherosclerotic heart disease of native coronary artery without angina pectoris: Secondary | ICD-10-CM

## 2024-07-23 DIAGNOSIS — E119 Type 2 diabetes mellitus without complications: Secondary | ICD-10-CM

## 2024-07-23 DIAGNOSIS — I10 Essential (primary) hypertension: Secondary | ICD-10-CM | POA: Diagnosis not present

## 2024-07-23 DIAGNOSIS — I5032 Chronic diastolic (congestive) heart failure: Secondary | ICD-10-CM | POA: Diagnosis not present

## 2024-07-23 MED ORDER — ROSUVASTATIN CALCIUM 40 MG PO TABS: 40.0000 mg | ORAL_TABLET | Freq: Every day | ORAL | 3 refills | Status: AC

## 2024-07-23 NOTE — Patient Instructions (Addendum)
 Medication Instructions:  INCREASE CRESTOR  TO 40 MG DAILY. CONTINUE ALL OTHER CURRENT MEDICATION THERAPY.  Lab Work: CMET AND FASTING LIPID PANEL TO BE DONE IN 6 WEEKS. (AROUND September 03, 2024)  Testing/Procedures: Your physician has requested that you have a lexiscan myoview. For further information please visit [email or web address] Please follow instruction sheet, as given.  Your physician has requested that you have an ECHOCARDIOGRAM. Echocardiography is a painless test that uses sound waves to create images of your heart. It provides your doctor with information about the size and shape of your heart and how well your heart's chambers and valves are working. This procedure takes approximately one hour. There are no restrictions for this procedure. Please do NOT wear cologne, perfume, aftershave, or lotions (deodorant is allowed). Please arrive 15 minutes prior to your appointment time.  Please note: We ask at that you not bring children with you during ultrasound (echo/ vascular) testing. Due to room size and safety concerns, children are not allowed in the ultrasound rooms during exams. Our front office staff cannot provide observation of children in our lobby area while testing is being conducted. An adult accompanying a patient to their appointment will only be allowed in the ultrasound room at the discretion of the ultrasound technician under special circumstances. We apologize for any inconvenience.    Follow-Up: At [facility] you and your health needs are our priority.  As part of our continuing mission to provide you with exceptional heart care, our providers are all part of one team.  This team includes your primary Cardiologist (physician) and Advanced Practice Providers or APPs (Physician Assistants and Nurse Practitioners) who all work together to provide you with the care you need, when you need it.  Your next appointment:   6 MONTHS  Provider:   Madonna Large, DO

## 2024-07-23 NOTE — Progress Notes (Signed)
 Cardiology Office Note:  .   Date:  07/23/2024  ID:  Jill Munoz, DOB [birth year], MRN [id number] PCP:  Ransom Other, MD  Former Cardiology Providers: NA [facility] Providers Cardiologist:  Madonna Large, DO , [facility] (established care 01/31/2023) Electrophysiologist:  None  Click to update primary MD,subspecialty MD or APP then REFRESH:1}    Chief Complaint  Patient presents with   Follow-up    Congestive heart failure    History of Present Illness: .   Jill Munoz is a 73 y.o. [place] descent female whose past medical history and cardiovascular risk factors includes: Aortic atherosclerosis, GERD, glaucoma, Normocytic normochromic anemia, iron  deficiency, non-insulin -dependent diabetes mellitus type 2, history of necrotizing fasciitis status post left AKA, history of anemia status post PRBCs.   Patient was referred to the practice for evaluation and management heart failure.  She underwent a left AKA in 2023 and since then she is not back at baseline.  Her symptoms suggestive of congestive heart failure and prior echocardiogram in June 2023 noted LVEF at 40 to 45%, grade 1 diastolic dysfunction.  Since establishing care her GDMT has been uptitrated slowly due to intermittent episodes of AKI.  LVEF improved in July 2024; however, repeat echocardiogram in April 2025 (during hospitalization) noted mildly reduced LVEF at 40-45%, regional wall motion abnormalities, grade 1 diastolic dysfunction.    Patient was last seen in the office by myself in July 2024 and at that time was recommended to have a stress test done; however, still pending.  She was seen by APP in July 2025 overall was doing well at that time.  She presents today for follow-up.  Patient is accompanied by her husband at today's office visit. Patient denies anginal chest pain at rest. Overall functional capacity is limited due to left AKA, not wearing her orthotic, and predominantly wheelchair-bound Denies heart  failure symptoms. Last heart failure hospitalization April 2025. Unfortunately, she is undergoing treatment for chronic right foot ulcer which has progressed to osteomyelitis with abscess.  Surgical intervention was recommended but patient opted for medical therapy.  Currently getting IV antibiotics.  Review of Systems: .   Review of Systems  Cardiovascular:  Negative for chest pain, claudication, irregular heartbeat, leg swelling, near-syncope, orthopnea, palpitations, paroxysmal nocturnal dyspnea and syncope.  Respiratory:  Negative for shortness of breath.   Hematologic/Lymphatic: Negative for bleeding problem.    Studies Reviewed:   Echocardiogram: 04/03/2022:  1. The mitral valve is abnormally thickened. Though morphology favors calcification, cannot exclude small vegetation (study indication is  bacteremia). TEE may be considered. No evidence of mitral valve regurgitation.   2. Left ventricular ejection fraction, by estimation, is 40 to 45%. The left ventricle has mildly decreased function. The left ventricle  demonstrates global hypokinesis. There is severe asymmetric left  ventricular hypertrophy of the basal-septal  segment. Left ventricular diastolic parameters are consistent with Grade I  diastolic dysfunction (impaired relaxation).   3. Right ventricular systolic function is normal. The right ventricular  size is normal. Tricuspid regurgitation signal is inadequate for assessing  PA pressure.   4. The aortic valve is tricuspid. Aortic valve regurgitation is not  visualized.     04/15/2023: Left ventricle cavity is normal in size. Normal left ventricular wall thickness. Normal global wall motion. Normal LV systolic function with EF 66%. Doppler evidence of grade I (impaired) diastolic dysfunction, normal LAP. Left atrial cavity is moderately dilated. No significant valvular abnormality. No evidence of pulmonary hypertension.  02/10/2024 LVEF 40 to 45%.  Regional wall  motion abnormalities. Grade 1 diastolic dysfunction. RV systolic function mildly reduced, RV size normal. Small pericardial effusion. Mild mitral regurgitation Estimated RAP 8 mmHg  RADIOLOGY: N/A  Risk Assessment/Calculations:   The 10-year ASCVD risk score (Arnett DK, et al., 2019) is: 19.8%   Values used to calculate the score:     Age: 39 years     Clincally relevant sex: Female     Is Non-Hispanic African American: No     Diabetic: Yes     Tobacco smoker: No     Systolic Blood Pressure: 114 mmHg     Is BP treated: Yes     HDL Cholesterol: 44 mg/dL     Total Cholesterol: 148 mg/dL   Labs:       Latest Ref Rng & Units 07/14/2024    4:00 PM 06/25/2024    5:45 AM 06/24/2024    4:24 AM  CBC  WBC 3.8 - 10.8 Thousand/uL 6.1  7.7  7.2   Hemoglobin 11.7 - 15.5 g/dL 9.4  8.7  8.3   Hematocrit 35.0 - 45.0 % 29.6  26.4  25.4   Platelets 140 - 400 Thousand/uL 403  364  349        Latest Ref Rng & Units 07/14/2024    4:00 PM 06/25/2024    5:45 AM 06/24/2024    4:24 AM  BMP  Glucose 65 - 99 mg/dL 831  848  816   BUN 7 - 25 mg/dL 23  28  28    Creatinine 0.60 - 1.00 mg/dL 9.03  9.00  8.94   BUN/Creat Ratio 6 - 22 (calc) SEE NOTE:     Sodium 135 - 146 mmol/L 136  136  136   Potassium 3.5 - 5.3 mmol/L 4.2  3.7  3.8   Chloride 98 - 110 mmol/L 99  96  97   CO2 20 - 32 mmol/L 28  25  27    Calcium  8.6 - 10.4 mg/dL 9.4  9.1  9.1       Latest Ref Rng & Units 07/14/2024    4:00 PM 06/25/2024    5:45 AM 06/24/2024    4:24 AM  CMP  Glucose 65 - 99 mg/dL 831  848  816   BUN 7 - 25 mg/dL 23  28  28    Creatinine 0.60 - 1.00 mg/dL 9.03  9.00  8.94   Sodium 135 - 146 mmol/L 136  136  136   Potassium 3.5 - 5.3 mmol/L 4.2  3.7  3.8   Chloride 98 - 110 mmol/L 99  96  97   CO2 20 - 32 mmol/L 28  25  27    Calcium  8.6 - 10.4 mg/dL 9.4  9.1  9.1   Total Protein 6.1 - 8.1 g/dL 7.1     Total Bilirubin 0.2 - 1.2 mg/dL 0.2     AST 10 - 35 U/L 18     ALT 6 - 29 U/L 16       Lab Results   Component Value Date   CHOL 148 06/20/2024   HDL 44 06/20/2024   LDLCALC 82 06/20/2024   TRIG 112 06/20/2024   CHOLHDL 3.4 06/20/2024   No results for input(s): LIPOA in the last 8760 hours. No components found for: NTPROBNP No results for input(s): PROBNP in the last 8760 hours. No results for input(s): TSH in the last 8760 hours.  Physical Exam:    Today's Vitals   07/23/24 0916  BP:  114/78  Pulse: 83  SpO2: 97%  Weight: 105 lb (47.6 kg)  Height: 5' 1 (1.549 m)   Body mass index is 19.84 kg/m. Wt Readings from Last 3 Encounters:  07/23/24 105 lb (47.6 kg)  06/19/24 105 lb (47.6 kg)  05/27/24 105 lb (47.6 kg)    Physical Exam  Constitutional: No distress. She appears chronically ill.  Appears older than stated age, present in wheelchair, hemodynamically stable.   Neck: No JVD present.  Cardiovascular: Normal rate, regular rhythm, S1 normal, S2 normal, intact distal pulses and normal pulses. Exam reveals no gallop, no S3 and no S4.  No murmur heard. Pulmonary/Chest: Effort normal and breath sounds normal. No stridor. She has no wheezes. She has no rales.  Abdominal: Soft. Bowel sounds are normal. She exhibits no distension. There is no abdominal tenderness.  Musculoskeletal:        General: No edema.     Cervical back: Neck supple.     Comments: Left AKA, RLE wrapped in kerlix and ace bandage  Neurological: She is alert and oriented to person, place, and time. She has intact cranial nerves (2-12).  Skin: Skin is warm and moist.     Impression & Recommendation(s):  Impression:   ICD-10-CM   1. Chronic heart failure with preserved ejection fraction (HCC)  I50.32 MYOCARDIAL PERFUSION IMAGING    ECHOCARDIOGRAM COMPLETE    2. Coronary artery calcification seen on CT scan  I25.10 MYOCARDIAL PERFUSION IMAGING    ECHOCARDIOGRAM COMPLETE    rosuvastatin  (CRESTOR ) 40 MG tablet    3. Atherosclerosis of aorta  I70.0 MYOCARDIAL PERFUSION IMAGING     ECHOCARDIOGRAM COMPLETE    rosuvastatin  (CRESTOR ) 40 MG tablet    4. Essential hypertension  I10     5. Non-insulin  dependent type 2 diabetes mellitus (HCC)  E11.9 Comprehensive metabolic panel with GFR    Lipid panel    rosuvastatin  (CRESTOR ) 40 MG tablet    6. Hx of AKA (above knee amputation), left (HCC)  Z89.612        Recommendation(s):  Chronic heart failure with preserved ejection fraction [facility]) Coronary artery calcification seen on CT scan Atherosclerosis of aorta Stage C, NYHA class II Last hospitalization for congestive heart failure April 2025 Echo April 2025: LVEF 40-45%, grade 1 diastolic dysfunction, regional wall motion abnormalities, see report for additional details Echocardiogram independently reviewed at today's office visit which clearly illustrates regional wall motion abnormalities involving the inferoseptal and inferior segments. Patient was recommended to have a stress test back in 2024, but never had it done. Given her mildly reduced LVEF, regional wall motion abnormalities, poorly controlled diabetes, recurrent hospitalizations recommended ischemic workup.  Patient is predominantly wheelchair-bound and unable to comment on exertional symptoms. Risks, benefits, alternatives to nuclear stress test reviewed.  Patient and husband agreeable to proceed forward Continue losartan  50 mg p.o. daily. Continue spironolactone  25 mg p.o. daily. Continue Lasix  20 mg p.o. daily. Continue Jardiance  10 mg p.o. daily Will hold off on uptitration of GDMT at this time to avoid hypotension and AKI.  Essential hypertension Office blood pressures are well-controlled. Medications as discussed above  Non-insulin  dependent type 2 diabetes mellitus (HCC) Poorly controlled diabetic. Recent hemoglobin A1c 8.4% as of September 2025, [facility] database. Based on medication list at this point currently not on antiglycemic agents. Recommended to follow-up with her provider who manages  diabetes for titration of medical therapy Continue ARB, Jardiance , statin  Hyperlipidemia: Currently on Crestor  20 mg p.o. daily. Will increase Crestor  to 40  mg p.o. nightly. Fasting lipids and CMP in 6 weeks Recommended goal LDL <70 mg/dL  Hx of AKA (above knee amputation), left (HCC) Did not tolerate orthotics in the past. Currently ambulates with a wheelchair.   Orders Placed:  Orders Placed This Encounter  Procedures   Comprehensive metabolic panel with GFR   Lipid panel   MYOCARDIAL PERFUSION IMAGING    [facility]    Type of stress:   Lexiscan    Patient weight in lbs:   105   ECHOCARDIOGRAM COMPLETE    Standing Status:   Future    Expiration Date:   07/23/2025    Where should this test be performed:   [facility]    Does the patient weigh less than or greater than 250 lbs?:   Patient weighs less than 250 lbs    Perflutren  DEFINITY  (image enhancing agent) should be administered unless hypersensitivity or allergy exist:   Administer Perflutren     Reason for exam-Echo:   Other-Full Diagnosis List    Full ICD-10/Reason for Exam:   Heart failure [facility]) [[id number]]     Final Medication List:    Meds ordered this encounter  Medications   rosuvastatin  (CRESTOR ) 40 MG tablet    Sig: Take 1 tablet (40 mg total) by mouth at bedtime.    Dispense:  90 tablet    Refill:  3    Medications Discontinued During This Encounter  Medication Reason   rosuvastatin  (CRESTOR ) 20 MG tablet      Current Outpatient Medications:    Ascorbic Acid  (VITAMIN C) 1000 MG tablet, Take 1,000 mg by mouth daily., Disp: , Rfl:    Berberine Chloride 500 MG CAPS, Take 1 capsule by mouth in the morning, at noon, and at bedtime., Disp: , Rfl:    dorzolamide -timolol  (COSOPT ) 22.3-6.8 MG/ML ophthalmic solution, Place 1 drop into both eyes 2 (two) times daily., Disp: 10 mL, Rfl: 12   furosemide  (LASIX ) 20 MG tablet, Take Lasix  20 mg daily. You may take an  addition Lasix  20 mg daily (for a total of 40 mg) as needed for swelling., Disp: 90 tablet, Rfl: 1   glucose blood (ACCU-CHEK GUIDE) test strip, Use as instructed once a day, Disp: 100 each, Rfl: 3   JARDIANCE  10 MG TABS tablet, Take 10 mg by mouth daily., Disp: , Rfl:    linezolid  (ZYVOX ) 600 MG tablet, Take 1 tablet (600 mg total) by mouth 2 (two) times daily., Disp: 28 tablet, Rfl: 0   losartan  (COZAAR ) 50 MG tablet, Take 50 mg by mouth daily., Disp: , Rfl:    melatonin 3 MG TABS tablet, Take 3 mg by mouth at bedtime., Disp: , Rfl:    metroNIDAZOLE  (FLAGYL ) 500 MG tablet, Take 1 tablet (500 mg total) by mouth 2 (two) times daily for 20 days., Disp: 40 tablet, Rfl: 0   nystatin  (MYCOSTATIN /NYSTOP ) powder, Apply 1 Application topically 3 (three) times daily., Disp: 15 g, Rfl: 0   omeprazole (PRILOSEC) 40 MG capsule, Take 60 mg by mouth in the morning and at bedtime., Disp: , Rfl:    ondansetron  (ZOFRAN -ODT) 4 MG disintegrating tablet, Take 1 tablet (4 mg total) by mouth 2 (two) times daily as needed for nausea or vomiting., Disp: 30 tablet, Rfl: 2   promethazine  (PHENERGAN ) 12.5 MG tablet, Take 12.5 mg by mouth every 6 (six) hours as needed for vomiting., Disp: , Rfl:    spironolactone  (ALDACTONE ) 25 MG  tablet, Take 1 tablet (25 mg total) by mouth daily., Disp: 90 tablet, Rfl: 3   traMADol  (ULTRAM ) 50 MG tablet, Take 50 mg by mouth every 6 (six) hours as needed for moderate pain (pain score 4-6)., Disp: , Rfl:    rosuvastatin  (CRESTOR ) 40 MG tablet, Take 1 tablet (40 mg total) by mouth at bedtime., Disp: 90 tablet, Rfl: 3  Consent:   Informed Consent   Shared Decision Making/Informed Consent The risks [chest pain, shortness of breath, cardiac arrhythmias, dizziness, blood pressure fluctuations, myocardial infarction, stroke/transient ischemic attack, nausea, vomiting, allergic reaction, radiation exposure, metallic taste sensation and life-threatening complications (estimated to be 1 in 10,000)],  benefits (risk stratification, diagnosing coronary artery disease, treatment guidance) and alternatives of a nuclear stress test were discussed in detail with Ms. Jeangilles and she agrees to proceed.     Disposition:   6 months follow-up sooner if needed.  Her questions and concerns were addressed to her satisfaction. She voices understanding of the recommendations provided during this encounter.    Signed, Madonna Michele HAS, [facility]  A Division of [facility] [address], [place], [place] [address]  07/23/2024 10:24 AM

## 2024-07-28 ENCOUNTER — Ambulatory Visit (INDEPENDENT_AMBULATORY_CARE_PROVIDER_SITE_OTHER)

## 2024-07-28 ENCOUNTER — Ambulatory Visit: Admitting: Podiatry

## 2024-07-28 DIAGNOSIS — L02611 Cutaneous abscess of right foot: Secondary | ICD-10-CM | POA: Diagnosis not present

## 2024-07-28 DIAGNOSIS — L03031 Cellulitis of right toe: Secondary | ICD-10-CM

## 2024-07-28 DIAGNOSIS — M86271 Subacute osteomyelitis, right ankle and foot: Secondary | ICD-10-CM

## 2024-07-29 NOTE — Progress Notes (Signed)
  Subjective:  Patient ID: Jill Munoz, female    DOB: 1950-11-21,  MRN: [id number]  Chief Complaint  Patient presents with   Diabetic Ulcer    Rm11 Ulceration right 5th toe plantar/2 months/diabetic blood sugar 100/ red swollen     Discussed the use of AI scribe software for clinical note transcription with the patient, who gave verbal consent to proceed.  History of Present Illness Jill Munoz Dphy-Ijb is a 73 year old female with a history of bone infection who presents for follow-up of her right foot infection.  She has had a persistent infection in her right foot for over six weeks, with leakage from the wound now occurring every two days, which her husband states has improved compared to what it was. There are no recent fevers or chills. A nurse visits her home to change the dressing and monitor the wound. She is on long-term antibiotics for the bone infection, initially treated in the hospital. She had an allergic reaction to amoxicillin  and was switched to another antibiotic, which she tolerates well. Despite antibiotic treatment, the wound has not shown significant improvement since her hospital stay. She denies any pain in her foot.       Objective:  There were no vitals filed for this visit.  Physical Exam General: AAO x3, NAD  Dermatological: Full-thickness ulceration of the right foot submetatarsal 5 with superficial purulence noted.  After I debrided there is no further purulence.  There is localized edema and erythema but there is no ascending cellulitis at this time.  No fluctuation or crepitation.  There is no malodor.  Vascular: Dorsalis Pedis artery and Posterior Tibial artery pedal pulses are 2/4 bilateral with immedate capillary fill time.  There is no pain with calf compression, swelling, warmth, erythema.   Neruologic: Sensation decreased.  Musculoskeletal: No pain on exam.  Gait: Unassisted, Nonantalgic.         Results RADIOLOGY Foot X-ray:  There are cortical changes to the fifth metatarsal head as well as the proximal phalanx consistent with osteomyelitis.    Assessment:   1. Cellulitis and abscess of toe of right foot   2. Subacute osteomyelitis of right foot (HCC)      Plan:  Patient was evaluated and treated and all questions answered.  Assessment and Plan Assessment & Plan Osteomyelitis of right toe with cellulitis Chronic osteomyelitis with cellulitis, unresponsive to antibiotics. X-rays show bone erosion, indicating possible infection spread. Current antibiotics may be insufficient, surgical intervention may be necessary.  By previous recommendation in the hospital surgical intervention and we did discuss this again today. - Order MRI of right foot to assess infection extent. - Continue current antibiotics per infectious disease. - Discuss potential surgical intervention with partial fifth ray potation pending MRI results.,  - Advise monitoring for worsening infection signs.  Peripheral neuropathy Peripheral neuropathy complicates infection severity assessment due to lack of pain sensation.   Follow-up Follow-up necessary to evaluate infection progression and treatment effectiveness. - Schedule follow-up in one week to review MRI results and discuss management. - Instruct to contact office if MRI scheduling issues arise.    Return in about 1 week (around 08/04/2024) for infection, MRI follow-up.   Jill Munoz DPM

## 2024-08-03 DIAGNOSIS — E113513 Type 2 diabetes mellitus with proliferative diabetic retinopathy with macular edema, bilateral: Secondary | ICD-10-CM | POA: Diagnosis not present

## 2024-08-03 DIAGNOSIS — Z794 Long term (current) use of insulin: Secondary | ICD-10-CM | POA: Diagnosis not present

## 2024-08-04 ENCOUNTER — Ambulatory Visit: Admitting: Internal Medicine

## 2024-08-04 ENCOUNTER — Ambulatory Visit: Admitting: Podiatry

## 2024-08-08 ENCOUNTER — Ambulatory Visit
Admission: RE | Admit: 2024-08-08 | Discharge: 2024-08-08 | Disposition: A | Source: Ambulatory Visit | Attending: Podiatry

## 2024-08-08 DIAGNOSIS — M86271 Subacute osteomyelitis, right ankle and foot: Secondary | ICD-10-CM

## 2024-08-10 ENCOUNTER — Other Ambulatory Visit: Payer: Self-pay | Admitting: Podiatry

## 2024-08-10 ENCOUNTER — Telehealth: Payer: Self-pay | Admitting: Podiatry

## 2024-08-10 DIAGNOSIS — M86271 Subacute osteomyelitis, right ankle and foot: Secondary | ICD-10-CM

## 2024-08-10 NOTE — Telephone Encounter (Signed)
 Patient was scheduled for MRI on Saturday, 10/25; appointment was cancelled per site notation:  "Patient unable to move independently; severely kyphotic. Needs to be rescheduled at hospital. // jv"  MRI needs to be scheduled at the hospital. Please contact the patient's husband once the appointment has been rescheduled or if there are any additional concerns.

## 2024-08-13 ENCOUNTER — Ambulatory Visit (HOSPITAL_COMMUNITY)
Admission: RE | Admit: 2024-08-13 | Discharge: 2024-08-13 | Disposition: A | Discharge status: Home / Self Care | Source: Ambulatory Visit | Attending: Podiatry | Admitting: Podiatry

## 2024-08-13 DIAGNOSIS — M86271 Subacute osteomyelitis, right ankle and foot: Secondary | ICD-10-CM | POA: Diagnosis present

## 2024-08-17 ENCOUNTER — Encounter: Payer: Self-pay | Admitting: Radiology

## 2024-08-20 ENCOUNTER — Ambulatory Visit: Payer: Self-pay | Admitting: Podiatry

## 2024-08-20 NOTE — Progress Notes (Signed)
No answer - left message to return call.

## 2024-08-27 ENCOUNTER — Ambulatory Visit: Admitting: Podiatry

## 2024-08-27 ENCOUNTER — Encounter: Payer: Self-pay | Admitting: Podiatry

## 2024-08-27 VITALS — Ht 61.0 in | Wt 105.0 lb

## 2024-08-27 DIAGNOSIS — L97514 Non-pressure chronic ulcer of other part of right foot with necrosis of bone: Secondary | ICD-10-CM | POA: Diagnosis not present

## 2024-08-27 DIAGNOSIS — M86271 Subacute osteomyelitis, right ankle and foot: Secondary | ICD-10-CM

## 2024-08-27 MED ORDER — SANTYL 250 UNIT/GM EX OINT: 1.0000 | TOPICAL_OINTMENT | Freq: Every day | CUTANEOUS | 0 refills | Status: AC

## 2024-08-27 MED ORDER — LINEZOLID 600 MG PO TABS: 600.0000 mg | ORAL_TABLET | Freq: Two times a day (BID) | ORAL | 0 refills | Status: AC

## 2024-08-27 NOTE — Progress Notes (Signed)
  Subjective:  Patient ID: Jill Munoz, female    DOB: Jan 07, 1951,  MRN: [id number]  Chief Complaint  Patient presents with   Diabetic Ulcer    Pt is here to f/u on right toe and discuss MRI results.     History of Present Illness Jessenya Berdan Dphy-Ijb is a 73 year old female with a history of bone infection who presents for follow-up of her right foot infection.  She states that the foot is doing better.  She denies any drainage or pus or any pain.  She is finished course of antibiotics.  She did seek a second opinion with [facility] as well.    Objective:  There were no vitals filed for this visit.  Physical Exam General: AAO x3, NAD  Dermatological: Full-thickness ulceration of the right foot submetatarsal 5 with completely fibrotic wound base.  There is no drainage or purulence today.  There is edema around the area but is no significant cellulitis identified today.  There is no fluctuation or crepitation.  There is no malodor.  Wound is approximate 2 cm in diameter.   Vascular: Dorsalis Pedis artery and Posterior Tibial artery pedal pulses are palpable with immedate capillary fill time.  There is no pain with calf compression, swelling, warmth, erythema.   Neruologic: Sensation decreased.  Musculoskeletal: No pain on exam.  Gait: Unassisted, Nonantalgic.          Assessment:   1. Cellulitis and abscess of toe of right foot   2. Subacute osteomyelitis of right foot (HCC)      Plan:  Patient was evaluated and treated and all questions answered.  Assessment and Plan Assessment & Plan Osteomyelitis of right toe with resolving cellulitis -I again reviewed the MRI.  I have recommended again today partial amputation of the fifth ray.  Other providers will also recommend the same.  She states that she feels the foot is doing better but I discussed the MRI findings still show infection and if infection were to spread she can lose the foot or leg will  become septic.  Her family member state that she is not to go for amputation.  More referred to the wound care center for possible hyperbaric oxygen therapy or advanced wound care treatment.  I will have refilled 1 week of Zyvox  and will touch base with her infectious disease specialist in regards to ongoing antibiotics. -Updated blood work ordered. -Monitor for any clinical signs or symptoms of infection and directed to call the office immediately should any occur or go to the ER.  Return in about 2 weeks (around 09/10/2024) for ulcer right foot; infection.  Donnice JONELLE Fees DPM

## 2024-08-31 ENCOUNTER — Telehealth: Payer: Self-pay | Admitting: Podiatry

## 2024-08-31 ENCOUNTER — Ambulatory Visit: Admitting: Podiatry

## 2024-08-31 NOTE — Telephone Encounter (Signed)
 Patient's spouse called in regards to the referral sent over to the wound care specialist. They haven't received a call from that office to schedule an appointment.

## 2024-09-15 ENCOUNTER — Encounter (HOSPITAL_BASED_OUTPATIENT_CLINIC_OR_DEPARTMENT_OTHER): Admitting: General Surgery

## 2024-09-15 DIAGNOSIS — L97514 Non-pressure chronic ulcer of other part of right foot with necrosis of bone: Secondary | ICD-10-CM | POA: Diagnosis present

## 2024-09-15 DIAGNOSIS — I5022 Chronic systolic (congestive) heart failure: Secondary | ICD-10-CM | POA: Diagnosis not present

## 2024-09-15 DIAGNOSIS — M86671 Other chronic osteomyelitis, right ankle and foot: Secondary | ICD-10-CM | POA: Diagnosis not present

## 2024-09-15 DIAGNOSIS — E11621 Type 2 diabetes mellitus with foot ulcer: Secondary | ICD-10-CM | POA: Insufficient documentation

## 2024-09-15 DIAGNOSIS — Z89612 Acquired absence of left leg above knee: Secondary | ICD-10-CM | POA: Diagnosis not present

## 2024-09-16 ENCOUNTER — Telehealth (HOSPITAL_BASED_OUTPATIENT_CLINIC_OR_DEPARTMENT_OTHER): Payer: Self-pay

## 2024-09-16 NOTE — Telephone Encounter (Signed)
 Acceptable risk.   Dr. Symphonie Schneiderman

## 2024-09-16 NOTE — Telephone Encounter (Signed)
   Pre-operative Risk Assessment    Patient Name: Jill Munoz  DOB: 1951/06/05 MRN: [id number]   Date of last office visit: 07/23/24 with Dr. Michele Date of next office visit: NA  Request for Surgical Clearance    Procedure:  Hyperbaric Oxygen Treatment- 2.0 ATA for 10 minutes, with 2-5 minute breaks for treatment of chronic refractory osteomyelitis  Date of Surgery:  Clearance TBD                                 Surgeon:  NA Surgeon's Group or Practice Name:  [facility] Phone number:  [phone] Fax number:  [phone]   Type of Clearance Requested:   - Medical    Type of Anesthesia:  Not Indicated   Additional requests/questions:    Bonney Augustin JONETTA Delores   09/16/2024, 3:48 PM

## 2024-09-17 ENCOUNTER — Ambulatory Visit: Admitting: Podiatry

## 2024-09-17 ENCOUNTER — Encounter (HOSPITAL_BASED_OUTPATIENT_CLINIC_OR_DEPARTMENT_OTHER): Payer: Self-pay | Admitting: General Surgery

## 2024-09-17 ENCOUNTER — Telehealth (HOSPITAL_COMMUNITY): Payer: Self-pay | Admitting: *Deleted

## 2024-09-17 NOTE — Telephone Encounter (Signed)
   Patient Name: Jill Munoz  DOB: 1950-11-04 MRN: [id number]  Primary Cardiologist: Madonna Large, DO  Chart reviewed as part of pre-operative protocol coverage. Given past medical history and time since last visit, based on ACC/AHA guidelines, [name] is at acceptable risk for the planned procedure without further cardiovascular testing.   Per Dr. Large 09/16/2024 Acceptable risk.    Dr. Large   The patient was advised that if she develops new symptoms prior to surgery to contact our office to arrange for a follow-up visit, and she verbalized understanding.  I will route this recommendation to the requesting party via Epic fax function and remove from pre-op pool.  Please call with questions.  Lamarr Satterfield, NP 09/17/2024, 12:15 PM

## 2024-09-17 NOTE — Telephone Encounter (Signed)
 Detailed instructions for stress test left on pt's vm.

## 2024-09-22 ENCOUNTER — Other Ambulatory Visit: Payer: Self-pay | Admitting: Cardiology

## 2024-09-22 DIAGNOSIS — I5032 Chronic diastolic (congestive) heart failure: Secondary | ICD-10-CM

## 2024-09-22 DIAGNOSIS — I700 Atherosclerosis of aorta: Secondary | ICD-10-CM

## 2024-09-22 DIAGNOSIS — I2510 Atherosclerotic heart disease of native coronary artery without angina pectoris: Secondary | ICD-10-CM

## 2024-09-24 ENCOUNTER — Ambulatory Visit (HOSPITAL_COMMUNITY): Admission: RE | Admit: 2024-09-24 | Source: Ambulatory Visit | Attending: Cardiology | Admitting: Cardiology

## 2024-09-24 ENCOUNTER — Ambulatory Visit (HOSPITAL_COMMUNITY)

## 2024-09-30 ENCOUNTER — Telehealth (HOSPITAL_COMMUNITY): Payer: Self-pay | Admitting: Cardiology

## 2024-09-30 ENCOUNTER — Other Ambulatory Visit: Payer: Self-pay | Admitting: Cardiology

## 2024-09-30 DIAGNOSIS — I5042 Chronic combined systolic (congestive) and diastolic (congestive) heart failure: Secondary | ICD-10-CM

## 2024-09-30 NOTE — Telephone Encounter (Signed)
 Patient called and cancelled Myoview and echocardiogram.  I have called and left voicemail for patient to call and reschedule. Order will be removed from the WQ an d if patient calls back we will reinstate the order.Thank you.

## 2024-10-01 ENCOUNTER — Encounter (HOSPITAL_BASED_OUTPATIENT_CLINIC_OR_DEPARTMENT_OTHER): Admitting: General Surgery

## 2024-10-01 DIAGNOSIS — E11621 Type 2 diabetes mellitus with foot ulcer: Secondary | ICD-10-CM | POA: Diagnosis not present

## 2024-10-04 NOTE — Progress Notes (Signed)
 Procedure Note  DME OD  Avastin  OD   RTC 9 weeks for exam / OCT OD / OPTOS color OD / probable Avastin  OD  I personally performed or jointly provided the services with an auxiliary provider BANKER, pensions consultant, etc.) and a clinical associate, but no participation of a resident.  Burnard Calin, MD, PhD [facility]

## 2024-10-07 ENCOUNTER — Encounter (HOSPITAL_BASED_OUTPATIENT_CLINIC_OR_DEPARTMENT_OTHER): Admitting: General Surgery

## 2024-10-13 ENCOUNTER — Telehealth: Payer: Self-pay | Admitting: Podiatry

## 2024-10-13 ENCOUNTER — Telehealth: Payer: Self-pay | Admitting: Lab

## 2024-10-13 NOTE — Telephone Encounter (Signed)
 Sent mess to Triage. Hubby left mess on vmail and all I could understand was they needed to know what to do with her foot???    [phone] is the ph# he left.

## 2024-10-13 NOTE — Telephone Encounter (Signed)
 Patient called wanting a letter drafted stating condition of foot her diagnosis and any disabilities or and limitations.

## 2024-10-14 ENCOUNTER — Encounter: Payer: Self-pay | Admitting: Lab

## 2024-10-26 ENCOUNTER — Encounter (HOSPITAL_BASED_OUTPATIENT_CLINIC_OR_DEPARTMENT_OTHER): Admitting: General Surgery

## 2024-10-29 ENCOUNTER — Other Ambulatory Visit: Payer: Self-pay

## 2024-10-29 ENCOUNTER — Telehealth: Payer: Self-pay | Admitting: Orthopedic Surgery

## 2024-10-29 NOTE — Telephone Encounter (Signed)
 I called pt and lm on vm to advise that note with dates and names of procedures is ready for pick up at the front desk whenever she has time to pick up.

## 2024-10-29 NOTE — Telephone Encounter (Signed)
 Pt request a letter for disability stating she had amputation done and date. Pt request a call back

## 2024-10-30 ENCOUNTER — Telehealth: Payer: Self-pay | Admitting: Orthopedic Surgery

## 2024-10-30 NOTE — Telephone Encounter (Signed)
 I called and sw pt's husband and he said that the pt is having phantom pain and needs rx. I advised that the pt has not been in the office in 3 years and would need eval to be sure. The pt's pain just started today and that given this abrupt onset after years of stability would need a provider to eval. I offered an appt today for eval and the pt's husband declined stating that it was too cold to come outside and that this was not taking good care of her. I tried to explain that taking good care of her would be making sure that this abrupt onset of pain was indeed related to phantom pain and to assess physical to make sure there is not any thing else going on. The pt's husband said no thank you and disconnected the call.

## 2024-10-30 NOTE — Telephone Encounter (Signed)
 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 I tried to schdule an apt since she hasn't been seen since 2023, but they refused and just want medication. Pharmacy is [facility] [address] ([id number] Call back number is [phone].

## 2024-11-05 ENCOUNTER — Encounter: Payer: Self-pay | Admitting: Cardiology

## 2024-11-16 ENCOUNTER — Encounter (HOSPITAL_BASED_OUTPATIENT_CLINIC_OR_DEPARTMENT_OTHER): Admitting: General Surgery

## 2024-11-23 ENCOUNTER — Encounter (HOSPITAL_BASED_OUTPATIENT_CLINIC_OR_DEPARTMENT_OTHER): Admitting: General Surgery
